# Patient Record
Sex: Male | Born: 1935 | Race: White | Hispanic: No | Marital: Married | Smoking: Former smoker
Health system: Southern US, Community
[De-identification: ages and names within clinical notes are randomized; demographics above are authoritative.]

## PROBLEM LIST (undated history)

## (undated) DIAGNOSIS — E7800 Pure hypercholesterolemia, unspecified: Secondary | ICD-10-CM

## (undated) DIAGNOSIS — Z923 Personal history of irradiation: Secondary | ICD-10-CM

## (undated) DIAGNOSIS — I10 Essential (primary) hypertension: Secondary | ICD-10-CM

## (undated) DIAGNOSIS — Z9221 Personal history of antineoplastic chemotherapy: Secondary | ICD-10-CM

## (undated) DIAGNOSIS — C3490 Malignant neoplasm of unspecified part of unspecified bronchus or lung: Secondary | ICD-10-CM

## (undated) HISTORY — DX: Pure hypercholesterolemia, unspecified: E78.00

## (undated) HISTORY — DX: Personal history of irradiation: Z92.3

## (undated) HISTORY — DX: Essential (primary) hypertension: I10

## (undated) HISTORY — DX: Personal history of antineoplastic chemotherapy: Z92.21

## (undated) HISTORY — PX: TONSILLECTOMY: SUR1361

## (undated) HISTORY — DX: Malignant neoplasm of unspecified part of unspecified bronchus or lung: C34.90

---

## 2009-03-14 HISTORY — PX: GASTROSTOMY TUBE PLACEMENT: SHX655

## 2010-03-14 DIAGNOSIS — C3490 Malignant neoplasm of unspecified part of unspecified bronchus or lung: Secondary | ICD-10-CM

## 2010-03-14 HISTORY — DX: Malignant neoplasm of unspecified part of unspecified bronchus or lung: C34.90

## 2010-07-28 ENCOUNTER — Other Ambulatory Visit: Payer: Self-pay | Admitting: Family Medicine

## 2010-07-28 DIAGNOSIS — R9389 Abnormal findings on diagnostic imaging of other specified body structures: Secondary | ICD-10-CM

## 2010-07-30 ENCOUNTER — Other Ambulatory Visit: Payer: Self-pay

## 2010-08-06 ENCOUNTER — Ambulatory Visit
Admission: RE | Admit: 2010-08-06 | Discharge: 2010-08-06 | Disposition: A | Discharge status: Home / Self Care | Payer: Medicare Other | Source: Ambulatory Visit | Attending: Family Medicine | Admitting: Family Medicine

## 2010-08-06 DIAGNOSIS — R9389 Abnormal findings on diagnostic imaging of other specified body structures: Secondary | ICD-10-CM

## 2010-08-06 MED ORDER — IOHEXOL 300 MG/ML  SOLN
75.0000 mL | Freq: Once | INTRAMUSCULAR | Status: AC | PRN
  Administered 2010-08-06: 75 mL via INTRAVENOUS

## 2010-08-13 HISTORY — PX: PORTACATH PLACEMENT: SHX2246

## 2010-08-18 ENCOUNTER — Encounter (INDEPENDENT_AMBULATORY_CARE_PROVIDER_SITE_OTHER): Payer: Medicare Other | Admitting: Thoracic Surgery

## 2010-08-18 ENCOUNTER — Other Ambulatory Visit: Payer: Self-pay | Admitting: Thoracic Surgery

## 2010-08-18 DIAGNOSIS — D491 Neoplasm of unspecified behavior of respiratory system: Secondary | ICD-10-CM

## 2010-08-18 DIAGNOSIS — R918 Other nonspecific abnormal finding of lung field: Secondary | ICD-10-CM

## 2010-08-19 NOTE — Letter (Signed)
August 18, 2010  Windle Guard, MD [address] [place], [place] [address]  Re:  [name], [name]              DOB:  [birth year]  Dear [name],  I saw the patient today.  This 75 year old patient quit smoking 20 years ago and below the angle reports a knot in the right neck which was a 3- cm lesion and the hard mass below the angle of the right mandible.  We got a CT scan of the chest which revealed that he had a left lower lobe mass with some hilar and subcarinal adenopathy.  He is referred here for evaluation.  He has had no hemoptysis, fever, chills, or excessive sputum.  PAST MEDICAL HISTORY:  PENICILLIN causes him to have a rash.  He takes glyburide, Actos for his diabetes, amlodipine for hypertension, lovastatin for dyslipidemia.  He also takes testosterone, Systane eyedrops, fish oil, and niacin.  As mentioned, he has had hypertension, hypercholesterolemia, diabetes mellitus type 2.  FAMILY HISTORY:  Noncontributory.  SOCIAL HISTORY:  Married, has two children.  Quit smoking in 1991.  He denies drinking alcohol on a regular basis.  REVIEW OF SYSTEMS:  He is 234 pounds, he is 5 feet 8 inches. GENERAL:  His weight has been stable. CARDIAC:  No angina or atrial fibrillation, but he gets shortness of breath with exertion. PULMONARY:  No asthma or wheezing. GI:  No nausea, vomiting, constipation, diarrhea. GU:  No kidney disease, dysuria, or frequent urination. VASCULAR:  No claudication, DVT, TIAs. NEUROLOGICAL:  No dizziness, headaches, blackouts, seizures. MUSCULOSKELETAL:  No arthritis. PSYCHIATRIC:  No depression or nervous. EYE AND ENT:  No changes in eyesight or hearing. HEMATOLOGICAL:  No problems with bleeding, clotting disorders, or anemia.  On physical exam, his blood pressure was 160/72, pulse 80, respirations 18, sats were 95%.  Head, eyes, ears, nose, and throat, were unremarkable.  Neck is supple without thyromegaly.  In the right neck and the  mandible of the right jaw, there is a 3-4 cm very hard mass that is not movable.  There is no scalene node adenopathy and no other supraclavicular adenopathy.  Chest is clear to auscultation and percussion.  Heart:  Regular sinus rhythm.  No murmur.  Abdomen:  Soft. There is no hepatosplenomegaly.  Extremities:  Pulses 2+.  There is no clubbing or edema.  Neurological:  He is oriented x3.  Sensory and motor intact.  Cranial nerves intact.  Unfortunately, the patient may either have stage IV lung cancer, although it is highly unusual for lung cancer to go to the right neck without multiple other areas of metastasis, it definitely by CT scan at least stage IIIA.  Thus, he may have a synchronous head and neck cancer, both of which would be smoking related.  Whatever the case is we need to proceed with workup which consisted of brain scan, CT scan of the neck, pulmonary function test, PET scan, and then I will be having a needle biopsy done of the right neck mass and I have scheduled him for bronchoscopy with endobronchial ultrasound and electromagnetic navigation to determine what type of lung cancer that he has.  We appreciate the opportunity of seeing the patient.  Sincerely,  Ines Bloomer, M.D. Electronically Signed  DPB/MEDQ  D:  08/18/2010  T:  08/19/2010  Job:  [id number]

## 2010-08-23 ENCOUNTER — Encounter (HOSPITAL_COMMUNITY): Payer: Self-pay

## 2010-08-23 ENCOUNTER — Ambulatory Visit (HOSPITAL_COMMUNITY)
Admission: RE | Admit: 2010-08-23 | Discharge: 2010-08-23 | Disposition: A | Discharge status: Home / Self Care | Payer: Medicare Other | Source: Ambulatory Visit | Attending: Thoracic Surgery | Admitting: Thoracic Surgery

## 2010-08-23 ENCOUNTER — Encounter (HOSPITAL_COMMUNITY)
Admission: RE | Admit: 2010-08-23 | Discharge: 2010-08-23 | Disposition: A | Discharge status: Home / Self Care | Payer: Medicare Other | Source: Ambulatory Visit | Attending: Thoracic Surgery | Admitting: Thoracic Surgery

## 2010-08-23 ENCOUNTER — Other Ambulatory Visit (HOSPITAL_COMMUNITY): Payer: Medicare Other

## 2010-08-23 ENCOUNTER — Other Ambulatory Visit: Payer: Self-pay | Admitting: Thoracic Surgery

## 2010-08-23 DIAGNOSIS — R059 Cough, unspecified: Secondary | ICD-10-CM | POA: Insufficient documentation

## 2010-08-23 DIAGNOSIS — J438 Other emphysema: Secondary | ICD-10-CM | POA: Insufficient documentation

## 2010-08-23 DIAGNOSIS — R918 Other nonspecific abnormal finding of lung field: Secondary | ICD-10-CM

## 2010-08-23 DIAGNOSIS — J988 Other specified respiratory disorders: Secondary | ICD-10-CM | POA: Insufficient documentation

## 2010-08-23 DIAGNOSIS — R599 Enlarged lymph nodes, unspecified: Secondary | ICD-10-CM | POA: Insufficient documentation

## 2010-08-23 DIAGNOSIS — R05 Cough: Secondary | ICD-10-CM | POA: Insufficient documentation

## 2010-08-23 DIAGNOSIS — R222 Localized swelling, mass and lump, trunk: Secondary | ICD-10-CM | POA: Insufficient documentation

## 2010-08-23 DIAGNOSIS — R0989 Other specified symptoms and signs involving the circulatory and respiratory systems: Secondary | ICD-10-CM | POA: Insufficient documentation

## 2010-08-23 DIAGNOSIS — J320 Chronic maxillary sinusitis: Secondary | ICD-10-CM | POA: Insufficient documentation

## 2010-08-23 DIAGNOSIS — M47812 Spondylosis without myelopathy or radiculopathy, cervical region: Secondary | ICD-10-CM | POA: Insufficient documentation

## 2010-08-23 DIAGNOSIS — R0609 Other forms of dyspnea: Secondary | ICD-10-CM | POA: Insufficient documentation

## 2010-08-23 LAB — GLUCOSE, CAPILLARY: Glucose-Capillary: 147 mg/dL — ABNORMAL HIGH (ref 70–99)

## 2010-08-23 MED ORDER — FLUDEOXYGLUCOSE F - 18 (FDG) INJECTION
17.8000 | Freq: Once | INTRAVENOUS | Status: AC | PRN
  Administered 2010-08-23: 17.8 via INTRAVENOUS

## 2010-08-23 MED ORDER — IOHEXOL 300 MG/ML  SOLN
100.0000 mL | Freq: Once | INTRAMUSCULAR | Status: AC | PRN
  Administered 2010-08-23: 100 mL via INTRAVENOUS

## 2010-08-26 ENCOUNTER — Other Ambulatory Visit: Payer: Self-pay | Admitting: Thoracic Surgery

## 2010-08-26 DIAGNOSIS — R221 Localized swelling, mass and lump, neck: Secondary | ICD-10-CM

## 2010-08-31 ENCOUNTER — Other Ambulatory Visit (HOSPITAL_COMMUNITY): Payer: Medicare Other

## 2010-08-31 ENCOUNTER — Ambulatory Visit (HOSPITAL_COMMUNITY)
Admission: RE | Admit: 2010-08-31 | Discharge: 2010-08-31 | Disposition: A | Discharge status: Home / Self Care | Payer: Medicare Other | Source: Ambulatory Visit | Attending: Thoracic Surgery | Admitting: Thoracic Surgery

## 2010-08-31 ENCOUNTER — Other Ambulatory Visit: Payer: Self-pay | Admitting: Interventional Radiology

## 2010-08-31 ENCOUNTER — Other Ambulatory Visit: Payer: Self-pay | Admitting: Thoracic Surgery

## 2010-08-31 ENCOUNTER — Encounter (HOSPITAL_COMMUNITY)
Admission: RE | Admit: 2010-08-31 | Discharge: 2010-08-31 | Disposition: A | Discharge status: Home / Self Care | Payer: Medicare Other | Source: Ambulatory Visit | Attending: Thoracic Surgery | Admitting: Thoracic Surgery

## 2010-08-31 DIAGNOSIS — R918 Other nonspecific abnormal finding of lung field: Secondary | ICD-10-CM

## 2010-08-31 DIAGNOSIS — R221 Localized swelling, mass and lump, neck: Secondary | ICD-10-CM

## 2010-08-31 DIAGNOSIS — Z01818 Encounter for other preprocedural examination: Secondary | ICD-10-CM | POA: Insufficient documentation

## 2010-08-31 DIAGNOSIS — Z01812 Encounter for preprocedural laboratory examination: Secondary | ICD-10-CM | POA: Insufficient documentation

## 2010-08-31 DIAGNOSIS — R599 Enlarged lymph nodes, unspecified: Secondary | ICD-10-CM | POA: Insufficient documentation

## 2010-08-31 DIAGNOSIS — Z79899 Other long term (current) drug therapy: Secondary | ICD-10-CM | POA: Insufficient documentation

## 2010-08-31 DIAGNOSIS — Z01810 Encounter for preprocedural cardiovascular examination: Secondary | ICD-10-CM | POA: Insufficient documentation

## 2010-08-31 DIAGNOSIS — R0602 Shortness of breath: Secondary | ICD-10-CM | POA: Insufficient documentation

## 2010-08-31 DIAGNOSIS — J984 Other disorders of lung: Secondary | ICD-10-CM | POA: Insufficient documentation

## 2010-08-31 LAB — GLUCOSE, CAPILLARY: Glucose-Capillary: 107 mg/dL — ABNORMAL HIGH (ref 70–99)

## 2010-08-31 LAB — COMPREHENSIVE METABOLIC PANEL
AST: 17 U/L (ref 0–37)
Albumin: 3.8 g/dL (ref 3.5–5.2)
BUN: 16 mg/dL (ref 6–23)
Calcium: 10.4 mg/dL (ref 8.4–10.5)
Creatinine, Ser: 1.27 mg/dL (ref 0.50–1.35)
Total Bilirubin: 0.5 mg/dL (ref 0.3–1.2)
Total Protein: 7.3 g/dL (ref 6.0–8.3)

## 2010-08-31 LAB — PROTIME-INR
INR: 0.98 (ref 0.00–1.49)
Prothrombin Time: 13.2 s (ref 11.6–15.2)

## 2010-08-31 LAB — CBC
HCT: 43.8 % (ref 39.0–52.0)
MCHC: 33.1 g/dL (ref 30.0–36.0)
MCV: 91.1 fL (ref 78.0–100.0)
RDW: 14.1 % (ref 11.5–15.5)

## 2010-08-31 LAB — SURGICAL PCR SCREEN
MRSA, PCR: NEGATIVE
Staphylococcus aureus: POSITIVE — AB

## 2010-09-02 ENCOUNTER — Other Ambulatory Visit: Payer: Self-pay | Admitting: Thoracic Surgery

## 2010-09-02 ENCOUNTER — Ambulatory Visit: Payer: Medicare Other | Admitting: Thoracic Surgery

## 2010-09-02 ENCOUNTER — Ambulatory Visit (HOSPITAL_COMMUNITY): Payer: Medicare Other

## 2010-09-02 ENCOUNTER — Ambulatory Visit (HOSPITAL_COMMUNITY)
Admission: RE | Admit: 2010-09-02 | Discharge: 2010-09-02 | Disposition: A | Discharge status: Home / Self Care | Payer: Medicare Other | Source: Ambulatory Visit | Attending: Thoracic Surgery | Admitting: Thoracic Surgery

## 2010-09-02 DIAGNOSIS — D487 Neoplasm of uncertain behavior of other specified sites: Secondary | ICD-10-CM

## 2010-09-02 DIAGNOSIS — E119 Type 2 diabetes mellitus without complications: Secondary | ICD-10-CM | POA: Insufficient documentation

## 2010-09-02 DIAGNOSIS — Z87891 Personal history of nicotine dependence: Secondary | ICD-10-CM | POA: Insufficient documentation

## 2010-09-02 DIAGNOSIS — C3430 Malignant neoplasm of lower lobe, unspecified bronchus or lung: Secondary | ICD-10-CM | POA: Insufficient documentation

## 2010-09-02 DIAGNOSIS — I10 Essential (primary) hypertension: Secondary | ICD-10-CM | POA: Insufficient documentation

## 2010-09-02 DIAGNOSIS — D381 Neoplasm of uncertain behavior of trachea, bronchus and lung: Secondary | ICD-10-CM

## 2010-09-02 DIAGNOSIS — Z79899 Other long term (current) drug therapy: Secondary | ICD-10-CM | POA: Insufficient documentation

## 2010-09-02 DIAGNOSIS — C771 Secondary and unspecified malignant neoplasm of intrathoracic lymph nodes: Secondary | ICD-10-CM | POA: Insufficient documentation

## 2010-09-02 LAB — GLUCOSE, CAPILLARY

## 2010-09-04 LAB — CULTURE, RESPIRATORY W GRAM STAIN

## 2010-09-05 LAB — CULTURE, BAL-QUANTITATIVE W GRAM STAIN: Colony Count: NO GROWTH

## 2010-09-06 NOTE — Op Note (Signed)
  NAME:  [name], SCHREUR NO.:  [id number]  MEDICAL RECORD NO.:  [id number]  LOCATION:  [facility]                         FACILITY:  [facility]  PHYSICIAN:  Ines Bloomer, M.D. DATE OF BIRTH:  06-12-1935  DATE OF PROCEDURE: DATE OF DISCHARGE:  09/02/2010                              OPERATIVE REPORT   PREOPERATIVE DIAGNOSIS:  Right neck mass, left lower lobe mass with mediastinal adenopathy.  POSTOPERATIVE DIAGNOSIS:  Squamous cell cancer, left lower lobe.  OPERATION PERFORMED:  Fiberoptic bronchoscopy with endobronchial ultrasound, electromagnetic navigation and biopsy of right neck mass.  SURGEON:  Ines Bloomer, MD  ANESTHESIA:  General anesthesia.  This patient had a previous biopsy of the right neck mass and the biopsy was nondiagnostic.  After he underwent general anesthesia, we prepped and draped the right neck mass and using an 11 blade to open the skin, we used a Tru-Cut needle with multiple passes to biopsy the right neck mass.  The patient had agreed to this and we found out about the procedure.  We then passed the video bronchoscope to the endotracheal tube.  The carina was in the midline.  The left mainstem, left upper lobe and left lower lobe orifices were normal.  The right mainstem, right upper lobe and right lower lobe orifices were normal.  We then pass the endobronchial ultrasound and identified a large 4L and a 7 node and we did multiple passes which included needle biopsies and brushes and needle aspirations and biopsies of this through the extended working channel.  After the electromagnetic navigation bronchoscope was complete and the EBUS was complete biopsying the 7 and 4 nodes, the video bronchoscope was removed and the patient was turned to the recovery room in stable condition.     Ines Bloomer, M.D.     DPB/MEDQ  D:  09/02/2010  T:  09/03/2010  Job:  [id number]  Electronically Signed by Jovita Gamma M.D. on 09/06/2010  04:28:16 PM

## 2010-09-08 ENCOUNTER — Ambulatory Visit (INDEPENDENT_AMBULATORY_CARE_PROVIDER_SITE_OTHER): Payer: Medicare Other | Admitting: Thoracic Surgery

## 2010-09-08 DIAGNOSIS — C3490 Malignant neoplasm of unspecified part of unspecified bronchus or lung: Secondary | ICD-10-CM

## 2010-09-09 NOTE — Letter (Signed)
September 08, 2010  Windle Guard, MD [address] [place], [place] [address]  Re:  [name], [name]                DOB:  November 17, 1935  Dear [name]:  I saw the patient back today.  His multiple biopsies of his neck mass revealed just inflammatory mass that I think is resolving.  We did not find any evidence any cancer there.  However, his left lower lobe lesion was positive for squamous cell cancer with metastasis to the subcarinal area, so he is a stage IIIA.  I will refer him for radiation and chemotherapy.  I have discussed this in detail with the patient and he agrees.  We will also put a Port-A-Cath in for his treatment.  I appreciate the opportunity of seeing the patient.  Ines Bloomer, M.D. Electronically Signed  DPB/MEDQ  D:  09/08/2010  T:  09/09/2010  Job:  [id number]  cc:   Lajuana Matte, M.D.

## 2010-09-10 NOTE — Assessment & Plan Note (Signed)
OFFICE VISIT  [name], [name] DOB:  06-Dec-1935                                        September 09, 2010 CHART #:  [id number]  We presented the patient to conference today and the consensus was he should be treated with radiation chemotherapy and he will be seeing Dr. Kathrynn Running and Dr. Shirline Frees.  However, there is still somewhat concerned that there may be an unknown primary with the right neck mass so I will get him appointment to see Dr. Jearld Fenton next week for evaluation.  I explained this in detail to the patient, and he and his wife agreed to this.  Nurse navigator will make the appointment for Dr. Arbutus Ped.  Ines Bloomer, M.D. Electronically Signed  DPB/MEDQ  D:  09/09/2010  T:  09/09/2010  Job:  [id number]  cc:   Dr. Marland Mcalpine, M.D. Suzanna Obey, M.D.

## 2010-09-13 ENCOUNTER — Ambulatory Visit
Admission: RE | Admit: 2010-09-13 | Discharge: 2010-09-13 | Disposition: A | Discharge status: Home / Self Care | Payer: Medicare Other | Source: Ambulatory Visit | Attending: Radiation Oncology | Admitting: Radiation Oncology

## 2010-09-13 DIAGNOSIS — Z510 Encounter for antineoplastic radiation therapy: Secondary | ICD-10-CM | POA: Insufficient documentation

## 2010-09-13 DIAGNOSIS — L589 Radiodermatitis, unspecified: Secondary | ICD-10-CM | POA: Insufficient documentation

## 2010-09-13 DIAGNOSIS — Y842 Radiological procedure and radiotherapy as the cause of abnormal reaction of the patient, or of later complication, without mention of misadventure at the time of the procedure: Secondary | ICD-10-CM | POA: Insufficient documentation

## 2010-09-13 DIAGNOSIS — K208 Other esophagitis without bleeding: Secondary | ICD-10-CM | POA: Insufficient documentation

## 2010-09-13 DIAGNOSIS — C3430 Malignant neoplasm of lower lobe, unspecified bronchus or lung: Secondary | ICD-10-CM | POA: Insufficient documentation

## 2010-09-16 ENCOUNTER — Other Ambulatory Visit: Payer: Self-pay | Admitting: Internal Medicine

## 2010-09-16 ENCOUNTER — Encounter (HOSPITAL_BASED_OUTPATIENT_CLINIC_OR_DEPARTMENT_OTHER): Payer: Medicare Other | Admitting: Internal Medicine

## 2010-09-16 DIAGNOSIS — C3430 Malignant neoplasm of lower lobe, unspecified bronchus or lung: Secondary | ICD-10-CM

## 2010-09-16 LAB — COMPREHENSIVE METABOLIC PANEL
ALT: 17 U/L (ref 0–53)
CO2: 24 mEq/L (ref 19–32)
Calcium: 9.4 mg/dL (ref 8.4–10.5)
Chloride: 103 mEq/L (ref 96–112)
Creatinine, Ser: 1.32 mg/dL (ref 0.50–1.35)
Glucose, Bld: 229 mg/dL — ABNORMAL HIGH (ref 70–99)
Sodium: 139 mEq/L (ref 135–145)
Total Bilirubin: 0.5 mg/dL (ref 0.3–1.2)
Total Protein: 7.1 g/dL (ref 6.0–8.3)

## 2010-09-16 LAB — CBC WITH DIFFERENTIAL/PLATELET
BASO%: 0.5 % (ref 0.0–2.0)
Eosinophils Absolute: 0.3 10*3/uL (ref 0.0–0.5)
HCT: 40.3 % (ref 38.4–49.9)
HGB: 13.6 g/dL (ref 13.0–17.1)
LYMPH%: 17.4 % (ref 14.0–49.0)
MCHC: 33.8 g/dL (ref 32.0–36.0)
MONO#: 0.3 10*3/uL (ref 0.1–0.9)
NEUT#: 5.1 10*3/uL (ref 1.5–6.5)
NEUT%: 72.9 % (ref 39.0–75.0)
Platelets: 318 10*3/uL (ref 140–400)
WBC: 7 10*3/uL (ref 4.0–10.3)
lymph#: 1.2 10*3/uL (ref 0.9–3.3)

## 2010-09-21 ENCOUNTER — Other Ambulatory Visit: Payer: Self-pay | Admitting: Internal Medicine

## 2010-09-21 ENCOUNTER — Encounter (HOSPITAL_BASED_OUTPATIENT_CLINIC_OR_DEPARTMENT_OTHER): Payer: Medicare Other | Admitting: Internal Medicine

## 2010-09-21 DIAGNOSIS — C3430 Malignant neoplasm of lower lobe, unspecified bronchus or lung: Secondary | ICD-10-CM

## 2010-09-21 DIAGNOSIS — Z5111 Encounter for antineoplastic chemotherapy: Secondary | ICD-10-CM

## 2010-09-21 LAB — COMPREHENSIVE METABOLIC PANEL
ALT: 19 U/L (ref 0–53)
AST: 19 U/L (ref 0–37)
Albumin: 4.3 g/dL (ref 3.5–5.2)
CO2: 23 mEq/L (ref 19–32)
Calcium: 10.1 mg/dL (ref 8.4–10.5)
Chloride: 101 mEq/L (ref 96–112)
Potassium: 4.3 mEq/L (ref 3.5–5.3)
Sodium: 135 mEq/L (ref 135–145)
Total Protein: 7.5 g/dL (ref 6.0–8.3)

## 2010-09-21 LAB — CBC WITH DIFFERENTIAL/PLATELET
BASO%: 1.1 % (ref 0.0–2.0)
Eosinophils Absolute: 0.4 10*3/uL (ref 0.0–0.5)
MCHC: 33.7 g/dL (ref 32.0–36.0)
MONO#: 0.5 10*3/uL (ref 0.1–0.9)
NEUT#: 5.1 10*3/uL (ref 1.5–6.5)
Platelets: 273 10*3/uL (ref 140–400)
RBC: 4.68 10*6/uL (ref 4.20–5.82)
WBC: 7.6 10*3/uL (ref 4.0–10.3)
lymph#: 1.6 10*3/uL (ref 0.9–3.3)
nRBC: 0 % (ref 0–0)

## 2010-09-27 ENCOUNTER — Other Ambulatory Visit: Payer: Self-pay | Admitting: Internal Medicine

## 2010-09-27 ENCOUNTER — Encounter (HOSPITAL_BASED_OUTPATIENT_CLINIC_OR_DEPARTMENT_OTHER): Payer: Medicare Other | Admitting: Internal Medicine

## 2010-09-27 DIAGNOSIS — C3430 Malignant neoplasm of lower lobe, unspecified bronchus or lung: Secondary | ICD-10-CM

## 2010-09-27 DIAGNOSIS — Z5111 Encounter for antineoplastic chemotherapy: Secondary | ICD-10-CM

## 2010-09-27 LAB — CBC WITH DIFFERENTIAL/PLATELET
BASO%: 0.4 % (ref 0.0–2.0)
Eosinophils Absolute: 0.3 10*3/uL (ref 0.0–0.5)
HCT: 39.9 % (ref 38.4–49.9)
LYMPH%: 13.8 % — ABNORMAL LOW (ref 14.0–49.0)
MCHC: 34.1 g/dL (ref 32.0–36.0)
MCV: 87.1 fL (ref 79.3–98.0)
MONO#: 0.5 10*3/uL (ref 0.1–0.9)
MONO%: 6.7 % (ref 0.0–14.0)
NEUT%: 74.9 % (ref 39.0–75.0)
Platelets: 222 10*3/uL (ref 140–400)
WBC: 7.8 10*3/uL (ref 4.0–10.3)

## 2010-09-27 LAB — COMPREHENSIVE METABOLIC PANEL
ALT: 20 U/L (ref 0–53)
AST: 23 U/L (ref 0–37)
Alkaline Phosphatase: 56 U/L (ref 39–117)
CO2: 26 mEq/L (ref 19–32)
Creatinine, Ser: 1.21 mg/dL (ref 0.50–1.35)
Sodium: 137 mEq/L (ref 135–145)
Total Bilirubin: 0.7 mg/dL (ref 0.3–1.2)

## 2010-09-30 ENCOUNTER — Ambulatory Visit (HOSPITAL_COMMUNITY)
Admission: RE | Admit: 2010-09-30 | Discharge: 2010-09-30 | Disposition: A | Discharge status: Home / Self Care | Payer: Medicare Other | Source: Ambulatory Visit | Attending: Thoracic Surgery | Admitting: Thoracic Surgery

## 2010-09-30 ENCOUNTER — Encounter (HOSPITAL_COMMUNITY)
Admission: RE | Admit: 2010-09-30 | Discharge: 2010-09-30 | Disposition: A | Discharge status: Home / Self Care | Payer: Medicare Other | Source: Ambulatory Visit | Attending: Thoracic Surgery | Admitting: Thoracic Surgery

## 2010-09-30 ENCOUNTER — Other Ambulatory Visit: Payer: Self-pay | Admitting: Thoracic Surgery

## 2010-09-30 DIAGNOSIS — C3490 Malignant neoplasm of unspecified part of unspecified bronchus or lung: Secondary | ICD-10-CM

## 2010-09-30 DIAGNOSIS — Z01812 Encounter for preprocedural laboratory examination: Secondary | ICD-10-CM | POA: Insufficient documentation

## 2010-09-30 DIAGNOSIS — Z01818 Encounter for other preprocedural examination: Secondary | ICD-10-CM | POA: Insufficient documentation

## 2010-09-30 DIAGNOSIS — M47814 Spondylosis without myelopathy or radiculopathy, thoracic region: Secondary | ICD-10-CM | POA: Insufficient documentation

## 2010-09-30 DIAGNOSIS — R0602 Shortness of breath: Secondary | ICD-10-CM | POA: Insufficient documentation

## 2010-09-30 LAB — SURGICAL PCR SCREEN: MRSA, PCR: NEGATIVE

## 2010-09-30 LAB — COMPREHENSIVE METABOLIC PANEL
Alkaline Phosphatase: 51 U/L (ref 39–117)
BUN: 25 mg/dL — ABNORMAL HIGH (ref 6–23)
Chloride: 100 mEq/L (ref 96–112)
GFR calc Af Amer: >60 mL/min (ref >60)
GFR calc non Af Amer: 59 mL/min — ABNORMAL LOW (ref >60)
Glucose, Bld: 188 mg/dL — ABNORMAL HIGH (ref 70–99)
Potassium: 4.9 mEq/L (ref 3.5–5.1)
Total Bilirubin: 0.8 mg/dL (ref 0.3–1.2)

## 2010-09-30 LAB — CBC
HCT: 38.1 % — ABNORMAL LOW (ref 39.0–52.0)
Hemoglobin: 12.7 g/dL — ABNORMAL LOW (ref 13.0–17.0)
MCH: 29.5 pg (ref 26.0–34.0)
MCHC: 33.3 g/dL (ref 30.0–36.0)

## 2010-10-01 ENCOUNTER — Ambulatory Visit (HOSPITAL_COMMUNITY): Payer: Medicare Other

## 2010-10-01 ENCOUNTER — Ambulatory Visit (HOSPITAL_COMMUNITY)
Admission: RE | Admit: 2010-10-01 | Discharge: 2010-10-01 | Disposition: A | Discharge status: Home / Self Care | Payer: Medicare Other | Source: Ambulatory Visit | Attending: Thoracic Surgery | Admitting: Thoracic Surgery

## 2010-10-01 DIAGNOSIS — Z01812 Encounter for preprocedural laboratory examination: Secondary | ICD-10-CM | POA: Insufficient documentation

## 2010-10-01 DIAGNOSIS — C3490 Malignant neoplasm of unspecified part of unspecified bronchus or lung: Secondary | ICD-10-CM | POA: Insufficient documentation

## 2010-10-01 DIAGNOSIS — Z01818 Encounter for other preprocedural examination: Secondary | ICD-10-CM | POA: Insufficient documentation

## 2010-10-01 DIAGNOSIS — Z79899 Other long term (current) drug therapy: Secondary | ICD-10-CM | POA: Insufficient documentation

## 2010-10-01 LAB — FUNGUS CULTURE W SMEAR: Fungal Smear: NONE SEEN

## 2010-10-04 ENCOUNTER — Other Ambulatory Visit: Payer: Self-pay | Admitting: Internal Medicine

## 2010-10-04 ENCOUNTER — Encounter (HOSPITAL_BASED_OUTPATIENT_CLINIC_OR_DEPARTMENT_OTHER): Payer: Medicare Other | Admitting: Internal Medicine

## 2010-10-04 DIAGNOSIS — C3430 Malignant neoplasm of lower lobe, unspecified bronchus or lung: Secondary | ICD-10-CM

## 2010-10-04 DIAGNOSIS — Z5111 Encounter for antineoplastic chemotherapy: Secondary | ICD-10-CM

## 2010-10-04 LAB — COMPREHENSIVE METABOLIC PANEL
AST: 13 U/L (ref 0–37)
BUN: 19 mg/dL (ref 6–23)
Calcium: 8.9 mg/dL (ref 8.4–10.5)
Chloride: 101 mEq/L (ref 96–112)
Creatinine, Ser: 1.18 mg/dL (ref 0.50–1.35)

## 2010-10-04 LAB — CBC WITH DIFFERENTIAL/PLATELET
Basophils Absolute: 0 10*3/uL (ref 0.0–0.1)
EOS%: 3.7 % (ref 0.0–7.0)
HCT: 36.1 % — ABNORMAL LOW (ref 38.4–49.9)
HGB: 12.2 g/dL — ABNORMAL LOW (ref 13.0–17.1)
MCH: 29.6 pg (ref 27.2–33.4)
MCV: 87.6 fL (ref 79.3–98.0)
MONO%: 9.9 % (ref 0.0–14.0)
NEUT%: 70.8 % (ref 39.0–75.0)
lymph#: 0.8 10*3/uL — ABNORMAL LOW (ref 0.9–3.3)

## 2010-10-04 NOTE — Op Note (Signed)
  NAMETAHA, DIMOND NO.:  [id number]  MEDICAL RECORD NO.:  [id number]  LOCATION:  [facility]                         FACILITY:  [facility]  PHYSICIAN:  Ines Bloomer, M.D. DATE OF BIRTH:  09-27-1935  DATE OF PROCEDURE: DATE OF DISCHARGE:                              OPERATIVE REPORT   PREOPERATIVE DIAGNOSIS:  Stage III A non-small cell lung cancer.  POSTOPERATIVE DIAGNOSIS:  Stage III A non-small cell lung cancer.  OPERATION PERFORMED:  Insertion of right IJ Port-A-Cath.  SURGEON:  Ines Bloomer, MD  General IV sedation and local anesthesia.  After prepping and draping the right chest, the right IJ puncture was performed after infiltrating the area with 1% Xylocaine and a guidewire was threaded under fluoro to the right atrium.  Stab wound was made around the guidewire removing the needle.  Another area was infiltrated with 1% Xylocaine inferior to this and transverse incision was made and a pocket was dissected out to the pectoral fascia.  A 9.6 preattached Bard Port-A-Cath was inserted and sutured in place with 2-0 silk and then the tubing run from the Port-A-Cath up to the right IJ and over the right IJ was passed a dilator with the peel-away sheath.  The tubing was made to the proximal right atrium at the SVC junction.  Over the dilator was passed a peel-away sheath.  The dilator and the guidewire were removed and through the peel-away sheath was passed the tubing and the tubing was removed.  It was confirmed to be right at the SVC/RA junction slightly in the right atrium.  It flushed easily and withdrew easily. Wound was closed with 3-0 Vicryl and Dermabond for the skin.  The patient returned to the recovery room in stable condition.     Ines Bloomer, M.D.     DPB/MEDQ  D:  10/01/2010  T:  10/01/2010  Job:  [id number]  cc:   Lajuana Matte, M.D.  Electronically Signed by Jovita Gamma M.D. on 10/04/2010 02:42:03 PM

## 2010-10-06 ENCOUNTER — Ambulatory Visit (INDEPENDENT_AMBULATORY_CARE_PROVIDER_SITE_OTHER): Payer: Medicare Other | Admitting: Thoracic Surgery

## 2010-10-06 DIAGNOSIS — C3490 Malignant neoplasm of unspecified part of unspecified bronchus or lung: Secondary | ICD-10-CM

## 2010-10-06 NOTE — Assessment & Plan Note (Signed)
OFFICE VISIT  Richart, HILLIARD BORGES DOB:  November 15, 1935                                        October 06, 2010 CHART #:  [id number]  The patient came in today.  His Port-A-Cath site is healing well.  His blood pressure is 120/60, pulse 95, respiratory rate 18, and sats were 95%.  He has had one treatment with chemotherapy.  Surprisingly, his Port-A-Cath and his right neck mass that decreased markedly and is continued to decrease since we saw him a week ago.  I have discussed this with Dr. Jearld Fenton and he wants to follow him up in the office to see if any further biopsy either particularly an open biopsy as needed.  Ines Bloomer, M.D. Electronically Signed  DPB/MEDQ  D:  10/06/2010  T:  10/06/2010  Job:  [id number]  cc:   Lajuana Matte, M.D. Windle Guard, M.D. Suzanna Obey, M.D.

## 2010-10-11 ENCOUNTER — Encounter (HOSPITAL_BASED_OUTPATIENT_CLINIC_OR_DEPARTMENT_OTHER): Payer: Medicare Other | Admitting: Internal Medicine

## 2010-10-11 ENCOUNTER — Other Ambulatory Visit: Payer: Self-pay | Admitting: Internal Medicine

## 2010-10-11 DIAGNOSIS — C3430 Malignant neoplasm of lower lobe, unspecified bronchus or lung: Secondary | ICD-10-CM

## 2010-10-11 DIAGNOSIS — Z5111 Encounter for antineoplastic chemotherapy: Secondary | ICD-10-CM

## 2010-10-11 LAB — CBC WITH DIFFERENTIAL/PLATELET
BASO%: 0.6 % (ref 0.0–2.0)
EOS%: 2 % (ref 0.0–7.0)
HCT: 38.2 % — ABNORMAL LOW (ref 38.4–49.9)
HGB: 13 g/dL (ref 13.0–17.1)
LYMPH%: 10.5 % — ABNORMAL LOW (ref 14.0–49.0)
MCH: 29.7 pg (ref 27.2–33.4)
MONO%: 11.3 % (ref 0.0–14.0)
NEUT#: 3.8 10*3/uL (ref 1.5–6.5)
RDW: 14.7 % — ABNORMAL HIGH (ref 11.0–14.6)
nRBC: 0 % (ref 0–0)

## 2010-10-11 LAB — COMPREHENSIVE METABOLIC PANEL
ALT: 13 U/L (ref 0–53)
AST: 12 U/L (ref 0–37)
Alkaline Phosphatase: 46 U/L (ref 39–117)
Creatinine, Ser: 1.18 mg/dL (ref 0.50–1.35)
Sodium: 139 mEq/L (ref 135–145)
Total Bilirubin: 0.8 mg/dL (ref 0.3–1.2)

## 2010-10-15 LAB — AFB CULTURE WITH SMEAR (NOT AT ARMC): Acid Fast Smear: NONE SEEN

## 2010-10-18 ENCOUNTER — Encounter (HOSPITAL_BASED_OUTPATIENT_CLINIC_OR_DEPARTMENT_OTHER): Payer: Medicare Other | Admitting: Internal Medicine

## 2010-10-18 ENCOUNTER — Other Ambulatory Visit: Payer: Self-pay | Admitting: Internal Medicine

## 2010-10-18 DIAGNOSIS — C3430 Malignant neoplasm of lower lobe, unspecified bronchus or lung: Secondary | ICD-10-CM

## 2010-10-18 DIAGNOSIS — Z5111 Encounter for antineoplastic chemotherapy: Secondary | ICD-10-CM

## 2010-10-18 DIAGNOSIS — C3490 Malignant neoplasm of unspecified part of unspecified bronchus or lung: Secondary | ICD-10-CM

## 2010-10-18 LAB — COMPREHENSIVE METABOLIC PANEL
Alkaline Phosphatase: 46 U/L (ref 39–117)
BUN: 16 mg/dL (ref 6–23)
CO2: 26 mEq/L (ref 19–32)
Creatinine, Ser: 1.28 mg/dL (ref 0.50–1.35)
Glucose, Bld: 260 mg/dL — ABNORMAL HIGH (ref 70–99)
Total Bilirubin: 0.5 mg/dL (ref 0.3–1.2)
Total Protein: 6.2 g/dL (ref 6.0–8.3)

## 2010-10-18 LAB — CBC WITH DIFFERENTIAL/PLATELET
Basophils Absolute: 0 10*3/uL (ref 0.0–0.1)
EOS%: 2.3 % (ref 0.0–7.0)
Eosinophils Absolute: 0.1 10*3/uL (ref 0.0–0.5)
HCT: 36.4 % — ABNORMAL LOW (ref 38.4–49.9)
HGB: 11.9 g/dL — ABNORMAL LOW (ref 13.0–17.1)
LYMPH%: 11 % — ABNORMAL LOW (ref 14.0–49.0)
MCH: 29.5 pg (ref 27.2–33.4)
MCV: 90.3 fL (ref 79.3–98.0)
MONO%: 11.2 % (ref 0.0–14.0)
NEUT#: 2.9 10*3/uL (ref 1.5–6.5)
NEUT%: 74.5 % (ref 39.0–75.0)
Platelets: 183 10*3/uL (ref 140–400)
RDW: 15 % — ABNORMAL HIGH (ref 11.0–14.6)

## 2010-10-22 ENCOUNTER — Other Ambulatory Visit: Payer: Self-pay | Admitting: Radiation Oncology

## 2010-10-22 DIAGNOSIS — C3490 Malignant neoplasm of unspecified part of unspecified bronchus or lung: Secondary | ICD-10-CM

## 2010-10-25 ENCOUNTER — Institutional Professional Consult (permissible substitution): Payer: Medicare Other | Admitting: Emergency Medicine

## 2010-10-25 ENCOUNTER — Other Ambulatory Visit: Payer: Self-pay | Admitting: Internal Medicine

## 2010-10-25 ENCOUNTER — Encounter (HOSPITAL_BASED_OUTPATIENT_CLINIC_OR_DEPARTMENT_OTHER): Payer: Medicare Other | Admitting: Internal Medicine

## 2010-10-25 DIAGNOSIS — C3430 Malignant neoplasm of lower lobe, unspecified bronchus or lung: Secondary | ICD-10-CM

## 2010-10-25 DIAGNOSIS — Z5111 Encounter for antineoplastic chemotherapy: Secondary | ICD-10-CM

## 2010-10-25 LAB — COMPREHENSIVE METABOLIC PANEL
Albumin: 3.9 g/dL (ref 3.5–5.2)
BUN: 26 mg/dL — ABNORMAL HIGH (ref 6–23)
CO2: 28 mEq/L (ref 19–32)
Glucose, Bld: 94 mg/dL (ref 70–99)
Potassium: 3.6 mEq/L (ref 3.5–5.3)
Sodium: 138 mEq/L (ref 135–145)
Total Protein: 6.5 g/dL (ref 6.0–8.3)

## 2010-10-25 LAB — CBC WITH DIFFERENTIAL/PLATELET
Basophils Absolute: 0 10*3/uL (ref 0.0–0.1)
Eosinophils Absolute: 0 10*3/uL (ref 0.0–0.5)
HGB: 12.1 g/dL — ABNORMAL LOW (ref 13.0–17.1)
LYMPH%: 9 % — ABNORMAL LOW (ref 14.0–49.0)
MCV: 88.9 fL (ref 79.3–98.0)
MONO#: 0.3 10*3/uL (ref 0.1–0.9)
MONO%: 5.5 % (ref 0.0–14.0)
NEUT#: 4.1 10*3/uL (ref 1.5–6.5)
Platelets: 165 10*3/uL (ref 140–400)
RBC: 4.06 10*6/uL — ABNORMAL LOW (ref 4.20–5.82)
RDW: 15.6 % — ABNORMAL HIGH (ref 11.0–14.6)
WBC: 4.9 10*3/uL (ref 4.0–10.3)

## 2010-11-01 ENCOUNTER — Other Ambulatory Visit: Payer: Self-pay | Admitting: Internal Medicine

## 2010-11-01 ENCOUNTER — Encounter (HOSPITAL_BASED_OUTPATIENT_CLINIC_OR_DEPARTMENT_OTHER): Payer: Medicare Other | Admitting: Internal Medicine

## 2010-11-01 DIAGNOSIS — Z5111 Encounter for antineoplastic chemotherapy: Secondary | ICD-10-CM

## 2010-11-01 DIAGNOSIS — C3430 Malignant neoplasm of lower lobe, unspecified bronchus or lung: Secondary | ICD-10-CM

## 2010-11-01 LAB — CBC WITH DIFFERENTIAL/PLATELET
BASO%: 0.7 % (ref 0.0–2.0)
EOS%: 0.7 % (ref 0.0–7.0)
Eosinophils Absolute: 0 10*3/uL (ref 0.0–0.5)
MCH: 29.5 pg (ref 27.2–33.4)
MCHC: 34.4 g/dL (ref 32.0–36.0)
MCV: 85.8 fL (ref 79.3–98.0)
MONO%: 9.9 % (ref 0.0–14.0)
NEUT#: 2.3 10*3/uL (ref 1.5–6.5)
RBC: 3.39 10*6/uL — ABNORMAL LOW (ref 4.20–5.82)
RDW: 15.7 % — ABNORMAL HIGH (ref 11.0–14.6)
nRBC: 0 % (ref 0–0)

## 2010-11-01 LAB — COMPREHENSIVE METABOLIC PANEL
ALT: 18 U/L (ref 0–53)
AST: 15 U/L (ref 0–37)
Alkaline Phosphatase: 49 U/L (ref 39–117)
Sodium: 138 mEq/L (ref 135–145)
Total Bilirubin: 1.1 mg/dL (ref 0.3–1.2)
Total Protein: 6 g/dL (ref 6.0–8.3)

## 2010-11-06 ENCOUNTER — Emergency Department (HOSPITAL_COMMUNITY): Payer: Medicare Other

## 2010-11-06 ENCOUNTER — Emergency Department (HOSPITAL_COMMUNITY)
Admission: EM | Admit: 2010-11-06 | Discharge: 2010-11-06 | Disposition: A | Discharge status: Home / Self Care | Payer: Medicare Other | Attending: Emergency Medicine | Admitting: Emergency Medicine

## 2010-11-06 DIAGNOSIS — K209 Esophagitis, unspecified without bleeding: Secondary | ICD-10-CM | POA: Insufficient documentation

## 2010-11-06 DIAGNOSIS — C3490 Malignant neoplasm of unspecified part of unspecified bronchus or lung: Secondary | ICD-10-CM | POA: Insufficient documentation

## 2010-11-06 DIAGNOSIS — Z79899 Other long term (current) drug therapy: Secondary | ICD-10-CM | POA: Insufficient documentation

## 2010-11-06 DIAGNOSIS — Z923 Personal history of irradiation: Secondary | ICD-10-CM | POA: Insufficient documentation

## 2010-11-06 DIAGNOSIS — R070 Pain in throat: Secondary | ICD-10-CM | POA: Insufficient documentation

## 2010-11-06 DIAGNOSIS — E119 Type 2 diabetes mellitus without complications: Secondary | ICD-10-CM | POA: Insufficient documentation

## 2010-11-06 DIAGNOSIS — I10 Essential (primary) hypertension: Secondary | ICD-10-CM | POA: Insufficient documentation

## 2010-11-06 DIAGNOSIS — D709 Neutropenia, unspecified: Secondary | ICD-10-CM | POA: Insufficient documentation

## 2010-11-06 LAB — CBC
HCT: 37.8 % — ABNORMAL LOW (ref 39.0–52.0)
Hemoglobin: 13.2 g/dL (ref 13.0–17.0)
MCHC: 34.9 g/dL (ref 30.0–36.0)
RBC: 4.45 MIL/uL (ref 4.22–5.81)
WBC: 0.9 10*3/uL — CL (ref 4.0–10.5)

## 2010-11-06 LAB — DIFFERENTIAL
Basophils Absolute: 0 10*3/uL (ref 0.0–0.1)
Basophils Relative: 0 % (ref 0–1)
Eosinophils Relative: 0 % (ref 0–5)
Lymphocytes Relative: 0 % — ABNORMAL LOW (ref 12–46)
Lymphs Abs: 0 10*3/uL — ABNORMAL LOW (ref 0.7–4.0)
Monocytes Relative: 0 % — ABNORMAL LOW (ref 3–12)
Neutrophils Relative %: 0 % — ABNORMAL LOW (ref 43–77)

## 2010-11-06 LAB — POCT I-STAT, CHEM 8
BUN: 19 mg/dL (ref 6–23)
Calcium, Ion: 1.25 mmol/L (ref 1.12–1.32)
Chloride: 103 mEq/L (ref 96–112)
Creatinine, Ser: 1.3 mg/dL (ref 0.50–1.35)
TCO2: 25 mmol/L (ref 0–100)

## 2010-11-06 LAB — BASIC METABOLIC PANEL
CO2: 26 mEq/L (ref 19–32)
Chloride: 100 mEq/L (ref 96–112)
GFR calc Af Amer: >60 mL/min (ref >60)
Potassium: 3.4 mEq/L — ABNORMAL LOW (ref 3.5–5.1)
Sodium: 136 mEq/L (ref 135–145)

## 2010-11-13 ENCOUNTER — Inpatient Hospital Stay (HOSPITAL_COMMUNITY)
Admission: EM | Admit: 2010-11-13 | Discharge: 2010-11-18 | DRG: 391 | Disposition: A | Discharge status: Home / Self Care | Payer: Medicare Other | Attending: Internal Medicine | Admitting: Internal Medicine

## 2010-11-13 DIAGNOSIS — Z880 Allergy status to penicillin: Secondary | ICD-10-CM

## 2010-11-13 DIAGNOSIS — R1310 Dysphagia, unspecified: Principal | ICD-10-CM | POA: Diagnosis present

## 2010-11-13 DIAGNOSIS — D6481 Anemia due to antineoplastic chemotherapy: Secondary | ICD-10-CM | POA: Diagnosis present

## 2010-11-13 DIAGNOSIS — I10 Essential (primary) hypertension: Secondary | ICD-10-CM | POA: Diagnosis present

## 2010-11-13 DIAGNOSIS — N179 Acute kidney failure, unspecified: Secondary | ICD-10-CM | POA: Diagnosis present

## 2010-11-13 DIAGNOSIS — Z79899 Other long term (current) drug therapy: Secondary | ICD-10-CM

## 2010-11-13 DIAGNOSIS — I951 Orthostatic hypotension: Secondary | ICD-10-CM | POA: Diagnosis present

## 2010-11-13 DIAGNOSIS — Z87891 Personal history of nicotine dependence: Secondary | ICD-10-CM

## 2010-11-13 DIAGNOSIS — E1149 Type 2 diabetes mellitus with other diabetic neurological complication: Secondary | ICD-10-CM | POA: Diagnosis present

## 2010-11-13 DIAGNOSIS — E876 Hypokalemia: Secondary | ICD-10-CM | POA: Diagnosis present

## 2010-11-13 DIAGNOSIS — E860 Dehydration: Secondary | ICD-10-CM | POA: Diagnosis present

## 2010-11-13 DIAGNOSIS — R197 Diarrhea, unspecified: Secondary | ICD-10-CM | POA: Diagnosis present

## 2010-11-13 DIAGNOSIS — B370 Candidal stomatitis: Secondary | ICD-10-CM | POA: Diagnosis present

## 2010-11-13 DIAGNOSIS — L98499 Non-pressure chronic ulcer of skin of other sites with unspecified severity: Secondary | ICD-10-CM | POA: Diagnosis present

## 2010-11-13 DIAGNOSIS — T451X5A Adverse effect of antineoplastic and immunosuppressive drugs, initial encounter: Secondary | ICD-10-CM | POA: Diagnosis present

## 2010-11-13 DIAGNOSIS — Y842 Radiological procedure and radiotherapy as the cause of abnormal reaction of the patient, or of later complication, without mention of misadventure at the time of the procedure: Secondary | ICD-10-CM | POA: Diagnosis present

## 2010-11-13 DIAGNOSIS — L589 Radiodermatitis, unspecified: Secondary | ICD-10-CM | POA: Diagnosis present

## 2010-11-13 DIAGNOSIS — C3430 Malignant neoplasm of lower lobe, unspecified bronchus or lung: Secondary | ICD-10-CM | POA: Diagnosis present

## 2010-11-13 DIAGNOSIS — E43 Unspecified severe protein-calorie malnutrition: Secondary | ICD-10-CM | POA: Diagnosis present

## 2010-11-13 DIAGNOSIS — Z923 Personal history of irradiation: Secondary | ICD-10-CM

## 2010-11-13 DIAGNOSIS — E1142 Type 2 diabetes mellitus with diabetic polyneuropathy: Secondary | ICD-10-CM | POA: Diagnosis present

## 2010-11-13 DIAGNOSIS — D72819 Decreased white blood cell count, unspecified: Secondary | ICD-10-CM | POA: Diagnosis present

## 2010-11-13 DIAGNOSIS — K208 Other esophagitis without bleeding: Secondary | ICD-10-CM | POA: Diagnosis present

## 2010-11-13 DIAGNOSIS — E785 Hyperlipidemia, unspecified: Secondary | ICD-10-CM | POA: Diagnosis present

## 2010-11-13 DIAGNOSIS — B3781 Candidal esophagitis: Secondary | ICD-10-CM | POA: Diagnosis present

## 2010-11-13 LAB — POCT I-STAT, CHEM 8
BUN: 13 mg/dL (ref 6–23)
Calcium, Ion: 1.11 mmol/L — ABNORMAL LOW (ref 1.12–1.32)
Chloride: 99 mEq/L (ref 96–112)
Creatinine, Ser: 1.6 mg/dL — ABNORMAL HIGH (ref 0.50–1.35)
Glucose, Bld: 111 mg/dL — ABNORMAL HIGH (ref 70–99)

## 2010-11-13 LAB — MAGNESIUM: Magnesium: 2.3 mg/dL (ref 1.5–2.5)

## 2010-11-13 LAB — DIFFERENTIAL
Eosinophils Relative: 0 % (ref 0–5)
Lymphocytes Relative: 9 % — ABNORMAL LOW (ref 12–46)
Lymphs Abs: 0.4 10*3/uL — ABNORMAL LOW (ref 0.7–4.0)
Monocytes Relative: 19 % — ABNORMAL HIGH (ref 3–12)
Neutro Abs: 2.8 10*3/uL (ref 1.7–7.7)

## 2010-11-13 LAB — GLUCOSE, CAPILLARY: Glucose-Capillary: 105 mg/dL — ABNORMAL HIGH (ref 70–99)

## 2010-11-13 LAB — CBC
HCT: 21.2 % — ABNORMAL LOW (ref 39.0–52.0)
Hemoglobin: 7.2 g/dL — ABNORMAL LOW (ref 13.0–17.0)
MCH: 29.6 pg (ref 26.0–34.0)
MCHC: 34 g/dL (ref 30.0–36.0)
RBC: 2.43 MIL/uL — ABNORMAL LOW (ref 4.22–5.81)

## 2010-11-13 NOTE — H&P (Signed)
NAME:  [name], [name] NO.:  [id number]  MEDICAL RECORD NO.:  [id number]  LOCATION:  [facility]                         FACILITY:  [facility]  PHYSICIAN:  Marinda Elk, M.D.DATE OF BIRTH:  08/01/1935  DATE OF ADMISSION:  11/13/2010 DATE OF DISCHARGE:                             HISTORY & PHYSICAL   PRIMARY CARE DOCTOR:  He does not remember.  ONCOLOGIST:  Lajuana Matte, M.D.  CHIEF COMPLAINT:  Generalized weakness with dizziness upon standing and pain with swallowing.  HISTORY:  This is a 75 year old man with past medical history of lung cell cancer treated by Dr. Arbutus Ped, last chemotherapy was on November 01, 2010, and last radiation therapy was on November 05, 2010, came to the ED last weekend complaining of the same pain of dysphagia.  He was given a GI cocktail and it got better, so he was discharged home.  Today, he returns with ongoing diarrhea for the last 4 days.  Also, minimal fluid intake and no solid intake secondary to his dysphagia with foods.  He also relates that he has been weak and dizzy upon standing every time he tries to get up, so he lays in bed most of the day.  He relates no fever, chills, cough, or shortness of breath.  His dysphagia is to solids more than to liquids and has been progressively getting worse to the point where he is only drinking liquids when he could tolerate them. He relates that he has had no fever, but he continues to have diarrhea. They have subsided this morning where he only had 2 this morning, but he had been having them for the past 3 days.  ALLERGIES:  PENICILLIN, he gets hives.  Very sensitive to HYDROCODONE.  MEDICATIONS: 1. He is on latanoprost 0.005% solution in each eye at bedtime. 2. Amlodipine 1 tablet at bedtime. 3. Actos 30 mg daily. 4. Glimepiride 4 mg 2 tablets with breakfast. 5. Lovastatin 2 tablets 20 mg daily. 6. Niacin 500 mg with breakfast. 7. Multivitamin 1 tab daily. 8. Fish oil 1200 mg  daily. 9. Systane drops in each eye.  PAST MEDICAL HISTORY: 1. Hypertension. 2. Hyperlipidemia. 3. Diabetes mellitus, unknown hemoglobin A1c. 4. Small cell lung cancer, unknown stage.  SOCIAL HISTORY:  He denies alcohol, tobacco, or drugs.  He lives with the family in [place].  FAMILY HISTORY:  His mother and father died at the age of 78s with heart failure.  REVIEW OF SYSTEMS:  10-point review of systems done, pertinent positives per HPI.  PHYSICAL EXAMINATION:  VITAL SIGNS: Temperature 97, pulse of 85, blood pressure 92/60, after a liter of fluid it was 115/54.  He was satting 100% on room air, breathing 20 times per minute. GENERAL: He is awake, alert, and oriented x3, in discomfort secondary to the dysphagia. HEENT:  Dry mucous membranes.  I think I see some white plaques at the right side on his tonsils, but atraumatic, normocephalic.  Anicteric. NECK;  No JVD.  No bruits.  No thyromegaly. CARDIOVASCULAR:  He has a regular rate and rhythm with positive S1 and S2.  No murmurs, rubs, or gallops. LUNGS: Good air movement, clear to auscultation. ABDOMEN: Positive  bowel sounds, nontender, nondistended, soft. EXTREMITIES: Positive pulses.  No clubbing, cyanosis, or edema. SKIN:  He has no rashes or ulceration except on his back, on his right scapula which he has some denuded skin and burst vesicles which he relates is secondary to his radiation therapy. NEURO EXAM:  Nonfocal.  LABORATORIES ON ADMISSION:  Shows sodium 137, potassium 3.0, chloride 99, glucose of 111, BUN of 13, creatinine of 1.6.  His hemoglobin was 8, after a liter of fluid it went down to 7.2.  His white count of 3.9, MCV of 87, platelet count of 187, ANC of 2.8.  No imaging at this time and no EKG.  ASSESSMENT AND PLAN: 1. Dysphagia in a patient with radiation therapy and chemotherapy.     Concern for esophagitis.  I will get a barium esophagus to rule out     cancer invasion, also rings and webs, but  this is most likely     esophageal candidiasis as he appears to have some plaque on the     right side on his tonsils.  We will start him on Diflucan and if no     improvement, we will have to call GI for further endoscopy.  I have     discussed with the family.  They have agreed to this.  There would     like to minimize procedures as possible.  So, we will treat with     Diflucan and continue to use GI cocktail as this relieves his pain     and see how he improves tomorrow. 2. Orthostatic hypotension by history.  He was dizzy when I sat him up     in bed.  So, I did not check his orthostatics.  We will give him IV     fluids gently due to his creatinine that is 1.6.  We do not have a     previous creatinine to know which is his baseline.  We will check a     FENa.  We will recheck a BMET in the morning.  Also, check     orthostatics in the morning to see if they have resolved. 3. Acute anemia.  His hemoglobin dropped from 8.8 to 7.2 here in the     ED with just a liter of fluid.  We will type and cross and     transfuse him.  We will get an anemia panel.  My guess is that this     drop in hemoglobin is probably secondary to chemotherapy.  We will     check a CBC in the morning. 4. Diarrhea.  The patient is on chemotherapy.  For C diff, we will     check a C diff and stool C diff PCR in stool. 5. Leukopenia probably secondary to chemotherapy.  We will continue to     monitor. 6. Acute kidney injury unknown baseline.  My guess is it is probably a     little bit lower.  He has not been drinking     or eating well and continues to have diarrhea.  So, we will give     him IV fluids and he will be admitted in the morning. 7. Ulcer on his right scalpel, that is probably secondary to radiation     therapy.  We will get a wound care consult.     Marinda Elk, M.D.     AF/MEDQ  D:  11/13/2010  T:  11/13/2010  Job:  [id number]  cc:   Lajuana Matte, M.D.  Electronically Signed  by Marinda Elk M.D. on 11/13/2010 07:28:07 PM

## 2010-11-14 ENCOUNTER — Inpatient Hospital Stay (HOSPITAL_COMMUNITY): Payer: Medicare Other

## 2010-11-14 DIAGNOSIS — K208 Other esophagitis: Secondary | ICD-10-CM

## 2010-11-14 DIAGNOSIS — C3490 Malignant neoplasm of unspecified part of unspecified bronchus or lung: Secondary | ICD-10-CM

## 2010-11-14 DIAGNOSIS — D509 Iron deficiency anemia, unspecified: Secondary | ICD-10-CM

## 2010-11-14 LAB — CBC
Hemoglobin: 7 g/dL — ABNORMAL LOW (ref 13.0–17.0)
MCH: 29.7 pg (ref 26.0–34.0)
Platelets: 159 10*3/uL (ref 150–400)
RBC: 2.36 MIL/uL — ABNORMAL LOW (ref 4.22–5.81)
WBC: 4 10*3/uL (ref 4.0–10.5)

## 2010-11-14 LAB — VITAMIN B12: Vitamin B-12: 435 pg/mL (ref 211–911)

## 2010-11-14 LAB — URINALYSIS, MICROSCOPIC ONLY
Leukocytes, UA: NEGATIVE
Protein, ur: NEGATIVE mg/dL
Specific Gravity, Urine: 1.018 (ref 1.005–1.030)
Urobilinogen, UA: 1 mg/dL (ref 0.0–1.0)

## 2010-11-14 LAB — COMPREHENSIVE METABOLIC PANEL
ALT: 9 U/L (ref 0–53)
AST: 11 U/L (ref 0–37)
Alkaline Phosphatase: 60 U/L (ref 39–117)
CO2: 27 mEq/L (ref 19–32)
Calcium: 7.9 mg/dL — ABNORMAL LOW (ref 8.4–10.5)
GFR calc non Af Amer: 52 mL/min — ABNORMAL LOW (ref >60)
Glucose, Bld: 111 mg/dL — ABNORMAL HIGH (ref 70–99)
Potassium: 3.1 mEq/L — ABNORMAL LOW (ref 3.5–5.1)
Sodium: 137 mEq/L (ref 135–145)

## 2010-11-14 LAB — DIFFERENTIAL
Basophils Absolute: 0 10*3/uL (ref 0.0–0.1)
Eosinophils Relative: 1 % (ref 0–5)
Lymphocytes Relative: 7 % — ABNORMAL LOW (ref 12–46)
Lymphs Abs: 0.3 10*3/uL — ABNORMAL LOW (ref 0.7–4.0)
Monocytes Relative: 15 % — ABNORMAL HIGH (ref 3–12)
Neutrophils Relative %: 76 % (ref 43–77)
WBC Morphology: INCREASED

## 2010-11-14 LAB — IRON AND TIBC
Iron: 28 ug/dL — ABNORMAL LOW (ref 42–135)
TIBC: 215 ug/dL (ref 215–435)

## 2010-11-14 LAB — GLUCOSE, CAPILLARY: Glucose-Capillary: 133 mg/dL — ABNORMAL HIGH (ref 70–99)

## 2010-11-14 LAB — PREPARE RBC (CROSSMATCH)

## 2010-11-14 LAB — ABO/RH: ABO/RH(D): A POS

## 2010-11-15 LAB — BASIC METABOLIC PANEL
BUN: 10 mg/dL (ref 6–23)
Calcium: 7.5 mg/dL — ABNORMAL LOW (ref 8.4–10.5)
Creatinine, Ser: 1.13 mg/dL (ref 0.50–1.35)
GFR calc non Af Amer: >60 mL/min (ref >60)
Glucose, Bld: 102 mg/dL — ABNORMAL HIGH (ref 70–99)

## 2010-11-15 LAB — CBC
HCT: 23 % — ABNORMAL LOW (ref 39.0–52.0)
Hemoglobin: 7.7 g/dL — ABNORMAL LOW (ref 13.0–17.0)
MCV: 89.1 fL (ref 78.0–100.0)
Platelets: 161 10*3/uL (ref 150–400)
RBC: 2.58 MIL/uL — ABNORMAL LOW (ref 4.22–5.81)
WBC: 4.2 10*3/uL (ref 4.0–10.5)

## 2010-11-15 LAB — PREPARE RBC (CROSSMATCH)

## 2010-11-16 DIAGNOSIS — C3490 Malignant neoplasm of unspecified part of unspecified bronchus or lung: Secondary | ICD-10-CM

## 2010-11-16 LAB — TYPE AND SCREEN
ABO/RH(D): A POS
Antibody Screen: NEGATIVE
Unit division: 0
Unit division: 0

## 2010-11-16 LAB — BASIC METABOLIC PANEL
BUN: 8 mg/dL (ref 6–23)
GFR calc non Af Amer: >60 mL/min (ref >60)
Glucose, Bld: 108 mg/dL — ABNORMAL HIGH (ref 70–99)
Potassium: 3.8 mEq/L (ref 3.5–5.1)

## 2010-11-16 LAB — GLUCOSE, CAPILLARY: Glucose-Capillary: 141 mg/dL — ABNORMAL HIGH (ref 70–99)

## 2010-11-16 LAB — DIFFERENTIAL
Eosinophils Relative: 1 % (ref 0–5)
Lymphocytes Relative: 9 % — ABNORMAL LOW (ref 12–46)
Lymphs Abs: 0.5 10*3/uL — ABNORMAL LOW (ref 0.7–4.0)
Monocytes Absolute: 0.7 10*3/uL (ref 0.1–1.0)

## 2010-11-16 LAB — CBC
HCT: 29.2 % — ABNORMAL LOW (ref 39.0–52.0)
MCHC: 33.9 g/dL (ref 30.0–36.0)
MCV: 87.7 fL (ref 78.0–100.0)
RDW: 17.9 % — ABNORMAL HIGH (ref 11.5–15.5)
WBC: 5.5 10*3/uL (ref 4.0–10.5)

## 2010-11-17 LAB — BASIC METABOLIC PANEL
CO2: 23 mEq/L (ref 19–32)
Calcium: 7.9 mg/dL — ABNORMAL LOW (ref 8.4–10.5)
GFR calc Af Amer: >60 mL/min (ref >60)
GFR calc non Af Amer: >60 mL/min (ref >60)
Sodium: 137 mEq/L (ref 135–145)

## 2010-11-17 LAB — GLUCOSE, CAPILLARY
Glucose-Capillary: 102 mg/dL — ABNORMAL HIGH (ref 70–99)
Glucose-Capillary: 95 mg/dL (ref 70–99)
Glucose-Capillary: 99 mg/dL (ref 70–99)

## 2010-11-17 LAB — CLOSTRIDIUM DIFFICILE BY PCR: Toxigenic C. Difficile by PCR: NEGATIVE

## 2010-11-17 LAB — CBC
HCT: 27.7 % — ABNORMAL LOW (ref 39.0–52.0)
Platelets: 187 10*3/uL (ref 150–400)
RDW: 18.1 % — ABNORMAL HIGH (ref 11.5–15.5)
WBC: 4.8 10*3/uL (ref 4.0–10.5)

## 2010-11-18 LAB — GLUCOSE, CAPILLARY: Glucose-Capillary: 114 mg/dL — ABNORMAL HIGH (ref 70–99)

## 2010-11-18 LAB — CBC
MCH: 29.1 pg (ref 26.0–34.0)
MCHC: 32.9 g/dL (ref 30.0–36.0)
MCV: 88.4 fL (ref 78.0–100.0)
Platelets: 209 10*3/uL (ref 150–400)
RDW: 18 % — ABNORMAL HIGH (ref 11.5–15.5)

## 2010-11-22 ENCOUNTER — Inpatient Hospital Stay (HOSPITAL_COMMUNITY)
Admission: EM | Admit: 2010-11-22 | Discharge: 2010-12-09 | DRG: 865 | Disposition: A | Discharge status: Home Health | Payer: Medicare Other | Attending: Internal Medicine | Admitting: Internal Medicine

## 2010-11-22 DIAGNOSIS — K208 Other esophagitis without bleeding: Secondary | ICD-10-CM | POA: Diagnosis present

## 2010-11-22 DIAGNOSIS — E876 Hypokalemia: Secondary | ICD-10-CM | POA: Diagnosis not present

## 2010-11-22 DIAGNOSIS — E119 Type 2 diabetes mellitus without complications: Secondary | ICD-10-CM | POA: Diagnosis present

## 2010-11-22 DIAGNOSIS — E860 Dehydration: Secondary | ICD-10-CM | POA: Diagnosis present

## 2010-11-22 DIAGNOSIS — R627 Adult failure to thrive: Secondary | ICD-10-CM | POA: Diagnosis present

## 2010-11-22 DIAGNOSIS — Z923 Personal history of irradiation: Secondary | ICD-10-CM

## 2010-11-22 DIAGNOSIS — Y842 Radiological procedure and radiotherapy as the cause of abnormal reaction of the patient, or of later complication, without mention of misadventure at the time of the procedure: Secondary | ICD-10-CM | POA: Diagnosis present

## 2010-11-22 DIAGNOSIS — Z880 Allergy status to penicillin: Secondary | ICD-10-CM

## 2010-11-22 DIAGNOSIS — N179 Acute kidney failure, unspecified: Secondary | ICD-10-CM | POA: Diagnosis present

## 2010-11-22 DIAGNOSIS — R1310 Dysphagia, unspecified: Secondary | ICD-10-CM | POA: Diagnosis present

## 2010-11-22 DIAGNOSIS — E43 Unspecified severe protein-calorie malnutrition: Secondary | ICD-10-CM | POA: Diagnosis present

## 2010-11-22 DIAGNOSIS — Z87891 Personal history of nicotine dependence: Secondary | ICD-10-CM

## 2010-11-22 DIAGNOSIS — E669 Obesity, unspecified: Secondary | ICD-10-CM | POA: Diagnosis present

## 2010-11-22 DIAGNOSIS — B0089 Other herpesviral infection: Principal | ICD-10-CM | POA: Diagnosis present

## 2010-11-22 DIAGNOSIS — I10 Essential (primary) hypertension: Secondary | ICD-10-CM | POA: Diagnosis present

## 2010-11-22 DIAGNOSIS — E785 Hyperlipidemia, unspecified: Secondary | ICD-10-CM | POA: Diagnosis present

## 2010-11-22 DIAGNOSIS — C3430 Malignant neoplasm of lower lobe, unspecified bronchus or lung: Secondary | ICD-10-CM | POA: Diagnosis present

## 2010-11-22 DIAGNOSIS — R000 Tachycardia, unspecified: Secondary | ICD-10-CM | POA: Diagnosis not present

## 2010-11-22 DIAGNOSIS — Z79899 Other long term (current) drug therapy: Secondary | ICD-10-CM

## 2010-11-22 LAB — CBC
HCT: 36 % — ABNORMAL LOW (ref 39.0–52.0)
Hemoglobin: 12 g/dL — ABNORMAL LOW (ref 13.0–17.0)
MCH: 29.6 pg (ref 26.0–34.0)
RBC: 4.06 MIL/uL — ABNORMAL LOW (ref 4.22–5.81)

## 2010-11-22 LAB — COMPREHENSIVE METABOLIC PANEL
ALT: 27 U/L (ref 0–53)
Alkaline Phosphatase: 80 U/L (ref 39–117)
BUN: 12 mg/dL (ref 6–23)
CO2: 23 mEq/L (ref 19–32)
GFR calc Af Amer: 55 mL/min — ABNORMAL LOW (ref >60)
GFR calc non Af Amer: 45 mL/min — ABNORMAL LOW (ref >60)
Glucose, Bld: 165 mg/dL — ABNORMAL HIGH (ref 70–99)
Potassium: 3.9 mEq/L (ref 3.5–5.1)
Sodium: 134 mEq/L — ABNORMAL LOW (ref 135–145)

## 2010-11-22 LAB — DIFFERENTIAL
Basophils Relative: 1 % (ref 0–1)
Lymphocytes Relative: 14 % (ref 12–46)
Monocytes Relative: 12 % (ref 3–12)
Neutro Abs: 4 10*3/uL (ref 1.7–7.7)
Neutrophils Relative %: 72 % (ref 43–77)

## 2010-11-22 LAB — URINALYSIS, ROUTINE W REFLEX MICROSCOPIC
Ketones, ur: NEGATIVE mg/dL
Nitrite: NEGATIVE
Protein, ur: NEGATIVE mg/dL
Urobilinogen, UA: 1 mg/dL (ref 0.0–1.0)

## 2010-11-23 LAB — GLUCOSE, CAPILLARY
Glucose-Capillary: 126 mg/dL — ABNORMAL HIGH (ref 70–99)
Glucose-Capillary: 133 mg/dL — ABNORMAL HIGH (ref 70–99)
Glucose-Capillary: 100 mg/dL — ABNORMAL HIGH (ref 70–99)

## 2010-11-23 LAB — CBC
HCT: 32.9 % — ABNORMAL LOW (ref 39.0–52.0)
MCHC: 33.1 g/dL (ref 30.0–36.0)
MCV: 89.2 fL (ref 78.0–100.0)
RDW: 18.2 % — ABNORMAL HIGH (ref 11.5–15.5)
WBC: 4.9 10*3/uL (ref 4.0–10.5)

## 2010-11-23 LAB — DIFFERENTIAL
Eosinophils Relative: 3 % (ref 0–5)
Lymphocytes Relative: 9 % — ABNORMAL LOW (ref 12–46)
Lymphs Abs: 0.5 10*3/uL — ABNORMAL LOW (ref 0.7–4.0)
Monocytes Absolute: 0.7 10*3/uL (ref 0.1–1.0)
Monocytes Relative: 14 % — ABNORMAL HIGH (ref 3–12)

## 2010-11-23 LAB — BASIC METABOLIC PANEL
BUN: 12 mg/dL (ref 6–23)
GFR calc non Af Amer: 57 mL/min — ABNORMAL LOW (ref >60)
Glucose, Bld: 125 mg/dL — ABNORMAL HIGH (ref 70–99)
Potassium: 3.4 mEq/L — ABNORMAL LOW (ref 3.5–5.1)

## 2010-11-24 ENCOUNTER — Other Ambulatory Visit: Payer: Self-pay | Admitting: Gastroenterology

## 2010-11-24 LAB — BASIC METABOLIC PANEL
GFR calc Af Amer: >60 mL/min (ref >60)
GFR calc non Af Amer: >60 mL/min (ref >60)
Glucose, Bld: 108 mg/dL — ABNORMAL HIGH (ref 70–99)
Potassium: 3.3 mEq/L — ABNORMAL LOW (ref 3.5–5.1)
Sodium: 138 mEq/L (ref 135–145)

## 2010-11-24 LAB — GLUCOSE, CAPILLARY
Glucose-Capillary: 102 mg/dL — ABNORMAL HIGH (ref 70–99)
Glucose-Capillary: 116 mg/dL — ABNORMAL HIGH (ref 70–99)
Glucose-Capillary: 120 mg/dL — ABNORMAL HIGH (ref 70–99)
Glucose-Capillary: 123 mg/dL — ABNORMAL HIGH (ref 70–99)
Glucose-Capillary: 132 mg/dL — ABNORMAL HIGH (ref 70–99)

## 2010-11-24 NOTE — Consult Note (Signed)
NAME:  [name], [name] NO.:  [id number]  MEDICAL RECORD NO.:  [id number]  LOCATION:  1506                         FACILITY:  [facility]  PHYSICIAN:  Reece Packer, M.D. 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  Office notes have been attached to the hospital information.  The patient was admitted on November 13, 2010, to the hospitalist service with weakness, difficulty swallowing, and pain with swallowing, and new diarrhea with dehydration.  Patient has remote history of smoking which was discontinued 23 years ago.  He was found to have squamous cell carcinoma of lung in June 2012, with a primary left lower lobe and also adenopathy subcarinal, left paratracheal, and right internal jugular.  He received radiation by Dr. Kathrynn Running which was conformal treatment from July 11 through November 05, 2010, this given with 7 cycles of low dose sensitizing weekly Taxol/carboplatin.  Last carboplatin and Taxol was given November 01, 2010.  He is scheduled back to Dr. Arbutus Ped on December 06, 2010. Patient also was seen in the emergency department on November 06, 2010, for weakness, and I believe was given IV fluids then.  He had not had any previous diarrhea and describes the stools as yellow.  He tolerated chemotherapy generally well, with no significant nausea and no worsening of preexisting diabetic peripheral neuropathy symptoms.  He has not seen any bleeding.  He has had progressive discomfort with swallowing for the last 1-2 weeks consistent with radiation esophagitis.  He has still been able to drink liquids and eat pudding and ice cream, and has not  tried any supplements.  Note, he is a diabetic.  No family members have had diarrhea.  The radiation and those chemotherapy drugs would not be expected to cause diarrhea.  Patient is feeling better today with IV fluids and having had 1 unit of packed red cells.  His dysphagia is still significant.  Diarrhea seems to be at least significantly improved.  REVIEW OF SYSTEMS:  No fever.  He has had some minimal cough when he tries to swallow which is nonproductive.  Denies shortness of breath. Now denies chest pain.  Baseline peripheral diabetic neuropathy symptoms unchanged.  No bleeding.  ALLERGIES:  Allergy to PENICILLIN.  PAST HISTORY: 1. Hypertension. 2. Diabetes. 3. Elevated cholesterol. 4. Tonsillectomy.  FAMILY HISTORY:  Parents died of cardiac disease in their 53s.  SOCIAL HISTORY:  He is married, lives with his wife, has 2 daughters. 60 pack-year smoking, discontinued 23 years ago.  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  PHYSICAL EXAMINATION:  GENERAL:  He is a pleasant obese gentleman looks his stated age, not in acute distress, in bed on room air.  Wife present and very supportive. VITAL SIGNS:  Temperature 99.2, blood pressure 99/61, heart rate 75 and regular, respirations 18, and 100% saturated on 2 liters oxygen earlier. HEENT:  Pupils  are equal and reactive, nonicteric.  Oral mucosa is moist and no obvious ulcerations or clear candida though this was seen previously.  Posterior pharynx not remarkable.  His radiation markings are at midsternal area. LUNGS:  Have no wheezes or rales. HEART:  Regular rate and rhythm. ABDOMEN:  Soft, quiet, nontender.  I cannot appreciate organomegaly. LOWER EXTREMITIES:  No pitting edema or cords.  He is able to move easily in the bed. NEURO:  Speech is fluent and appropriate.  LABS:  From [facility], November 01, 2010, white count 2.9, ANC 2.3, hemoglobin 10, platelets 124.  Sodium 138, potassium 3.1, chloride  100, CO2 27, glucose 177, BUN 21, creatinine 1.4, total bili 1.1, alk phos 49, GOT 15, GPT 18, total protein 6, albumin 3.2, calcium 9.4.  CBC on admission, white count 3.9, hemoglobin 7.2, platelets 184, this a.m.  CBC, white count 4, hemoglobin 7, platelets 159 and post 1 unit packed red cells, hemoglobin up to 8.  Chemistries today, sodium 137, potassium 3.1, chloride 102, CO2 27, glucose 111, BUN 13, creatinine 1.3.  Anemia panel from September 1, has serum iron of 28, 13% saturated. Fecal occult blood testing ordered.  Chest x-ray is pending and a barium swallow had also been ordered.  IMPRESSION AND RECOMMENDATIONS: 1. IIIB versus IV squamous cell carcinoma of lung in patient with long     past tobacco:  Patient has just completed radiation to the central     chest given with sensitizing chemotherapy.  The esophageal symptoms     are very consistent with radiation esophagitis.  I would suggest     Carafate slurry, viscous lidocaine, and Magic mouth wash p.r.n.     with liquid diet and IV fluids in addition to prevent dehydration.     He may also need pain medication either in liquid form or IV.  I     have ordered the above.  I would not do a barium swallow unless     symptoms do not improve over the next couple of weeks.  I do not     believe that he is having significant aspiration. 2. Anemia.  Hemoglobin had been 11.9 on October 18, 2010, and 10 on     August 20.  This drop may all be secondary to chemotherapy and     radiation, though it is significant drop and note low iron also.  I do     not find report of a PET scan in the hospital EMR, however, Dr.     Asa Lente new patient consult note mentions a PET scan with uptake     in the sigmoid colon of questionable significance.  I agree with     Heme checking stools and with 1 unit of packed red cells given and     would repeat CBC in the a.m.  Would add oral iron when he is able     to swallow pills.  Would have  Gastroenterology see if the diarrhea     does not resolve and otherwise he may need Gastroenterology     evaluation after the acute symptoms have improved.  Certainly he     could have some bleeding from radiation esophagitis, so heme-     positive stools now will not necessarily be lower gastrointestinal     origin. 3. Diarrhea:  I would not expect this with radiation to the chest or     with carboplatin/Taxol.  Evaluation and interventions in process  seem appropriate.  I will let Dr. Arbutus Ped know of the admission.  Please call between our visits if needed.  Time spent 90 minutes.   Reece Packer, M.D.     LPL/MEDQ  D:  11/14/2010  T:  11/15/2010  Job:  [id number]  cc:   Lajuana Matte, M.D.  Oneita Hurt, M.D. Fax: [phone]  Windle Guard, M.D. Fax: [phone]  Dewayne Shorter, MD  Electronically Signed by Jama Flavors M.D. on 11/24/2010 08:49:44 AM

## 2010-11-25 LAB — CBC
HCT: 33.2 % — ABNORMAL LOW (ref 39.0–52.0)
Hemoglobin: 11 g/dL — ABNORMAL LOW (ref 13.0–17.0)
MCH: 29.6 pg (ref 26.0–34.0)
MCV: 89.5 fL (ref 78.0–100.0)
Platelets: 289 10*3/uL (ref 150–400)
RBC: 3.71 MIL/uL — ABNORMAL LOW (ref 4.22–5.81)
RDW: 18 % — ABNORMAL HIGH (ref 11.5–15.5)

## 2010-11-25 LAB — GLUCOSE, CAPILLARY
Glucose-Capillary: 127 mg/dL — ABNORMAL HIGH (ref 70–99)
Glucose-Capillary: 134 mg/dL — ABNORMAL HIGH (ref 70–99)
Glucose-Capillary: 112 mg/dL — ABNORMAL HIGH (ref 70–99)
Glucose-Capillary: 154 mg/dL — ABNORMAL HIGH (ref 70–99)

## 2010-11-25 LAB — BASIC METABOLIC PANEL
Chloride: 102 mEq/L (ref 96–112)
GFR calc Af Amer: >60 mL/min (ref >60)
GFR calc non Af Amer: >60 mL/min (ref >60)
Potassium: 3.8 mEq/L (ref 3.5–5.1)
Sodium: 137 mEq/L (ref 135–145)

## 2010-11-26 LAB — DIFFERENTIAL
Basophils Absolute: 0.1 10*3/uL (ref 0.0–0.1)
Basophils Relative: 1 % (ref 0–1)
Lymphocytes Relative: 12 % (ref 12–46)
Monocytes Absolute: 0.6 10*3/uL (ref 0.1–1.0)
Monocytes Relative: 12 % (ref 3–12)
Neutro Abs: 3.3 10*3/uL (ref 1.7–7.7)
Neutrophils Relative %: 71 % (ref 43–77)

## 2010-11-26 LAB — COMPREHENSIVE METABOLIC PANEL
ALT: 14 U/L (ref 0–53)
Alkaline Phosphatase: 72 U/L (ref 39–117)
BUN: 6 mg/dL (ref 6–23)
CO2: 23 mEq/L (ref 19–32)
GFR calc Af Amer: >60 mL/min (ref >60)
GFR calc non Af Amer: >60 mL/min (ref >60)
Glucose, Bld: 179 mg/dL — ABNORMAL HIGH (ref 70–99)
Potassium: 4.2 mEq/L (ref 3.5–5.1)
Sodium: 135 mEq/L (ref 135–145)
Total Bilirubin: 0.4 mg/dL (ref 0.3–1.2)
Total Protein: 6.9 g/dL (ref 6.0–8.3)

## 2010-11-26 LAB — TRIGLYCERIDES: Triglycerides: 319 mg/dL — ABNORMAL HIGH (ref <150)

## 2010-11-26 LAB — GLUCOSE, CAPILLARY
Glucose-Capillary: 172 mg/dL — ABNORMAL HIGH (ref 70–99)
Glucose-Capillary: 148 mg/dL — ABNORMAL HIGH (ref 70–99)

## 2010-11-26 LAB — PREALBUMIN: Prealbumin: 16 mg/dL — ABNORMAL LOW (ref 17.0–34.0)

## 2010-11-26 LAB — CBC
HCT: 34.1 % — ABNORMAL LOW (ref 39.0–52.0)
Hemoglobin: 11.1 g/dL — ABNORMAL LOW (ref 13.0–17.0)
MCHC: 32.6 g/dL (ref 30.0–36.0)
RBC: 3.83 MIL/uL — ABNORMAL LOW (ref 4.22–5.81)

## 2010-11-26 LAB — MAGNESIUM: Magnesium: 1.7 mg/dL (ref 1.5–2.5)

## 2010-11-27 LAB — BASIC METABOLIC PANEL
BUN: 10 mg/dL (ref 6–23)
Calcium: 9.3 mg/dL (ref 8.4–10.5)
Chloride: 98 mEq/L (ref 96–112)
Creatinine, Ser: 1.11 mg/dL (ref 0.50–1.35)
GFR calc Af Amer: >60 mL/min (ref >60)

## 2010-11-27 LAB — CBC
HCT: 34.2 % — ABNORMAL LOW (ref 39.0–52.0)
MCH: 29.4 pg (ref 26.0–34.0)
MCHC: 33 g/dL (ref 30.0–36.0)
MCV: 88.8 fL (ref 78.0–100.0)
Platelets: 314 10*3/uL (ref 150–400)
RDW: 18 % — ABNORMAL HIGH (ref 11.5–15.5)
WBC: 4.6 10*3/uL (ref 4.0–10.5)

## 2010-11-27 LAB — GLUCOSE, CAPILLARY
Glucose-Capillary: 213 mg/dL — ABNORMAL HIGH (ref 70–99)
Glucose-Capillary: 160 mg/dL — ABNORMAL HIGH (ref 70–99)
Glucose-Capillary: 173 mg/dL — ABNORMAL HIGH (ref 70–99)

## 2010-11-28 LAB — BASIC METABOLIC PANEL
Calcium: 9.5 mg/dL (ref 8.4–10.5)
Creatinine, Ser: 1.03 mg/dL (ref 0.50–1.35)
GFR calc Af Amer: >60 mL/min (ref >60)
GFR calc non Af Amer: >60 mL/min (ref >60)

## 2010-11-28 LAB — GLUCOSE, CAPILLARY: Glucose-Capillary: 235 mg/dL — ABNORMAL HIGH (ref 70–99)

## 2010-11-29 LAB — CBC
HCT: 36.2 % — ABNORMAL LOW (ref 39.0–52.0)
MCHC: 32.3 g/dL (ref 30.0–36.0)
MCV: 89.6 fL (ref 78.0–100.0)
RDW: 18.1 % — ABNORMAL HIGH (ref 11.5–15.5)
WBC: 5.9 10*3/uL (ref 4.0–10.5)

## 2010-11-29 LAB — GLUCOSE, CAPILLARY: Glucose-Capillary: 201 mg/dL — ABNORMAL HIGH (ref 70–99)

## 2010-11-29 LAB — DIFFERENTIAL
Eosinophils Relative: 3 % (ref 0–5)
Lymphocytes Relative: 15 % (ref 12–46)
Lymphs Abs: 0.9 10*3/uL (ref 0.7–4.0)
Monocytes Absolute: 0.7 10*3/uL (ref 0.1–1.0)

## 2010-11-29 LAB — COMPREHENSIVE METABOLIC PANEL
Albumin: 2.7 g/dL — ABNORMAL LOW (ref 3.5–5.2)
BUN: 18 mg/dL (ref 6–23)
Chloride: 98 mEq/L (ref 96–112)
Creatinine, Ser: 1.02 mg/dL (ref 0.50–1.35)
Total Bilirubin: 0.3 mg/dL (ref 0.3–1.2)

## 2010-11-29 LAB — MAGNESIUM: Magnesium: 1.7 mg/dL (ref 1.5–2.5)

## 2010-11-29 LAB — PHOSPHORUS: Phosphorus: 3.4 mg/dL (ref 2.3–4.6)

## 2010-11-29 LAB — TRIGLYCERIDES: Triglycerides: 294 mg/dL — ABNORMAL HIGH (ref <150)

## 2010-11-30 LAB — BASIC METABOLIC PANEL
CO2: 24 mEq/L (ref 19–32)
Calcium: 9.3 mg/dL (ref 8.4–10.5)
GFR calc Af Amer: >60 mL/min (ref >60)
GFR calc non Af Amer: >60 mL/min (ref >60)
Sodium: 133 mEq/L — ABNORMAL LOW (ref 135–145)

## 2010-11-30 LAB — GLUCOSE, CAPILLARY
Glucose-Capillary: 252 mg/dL — ABNORMAL HIGH (ref 70–99)
Glucose-Capillary: 229 mg/dL — ABNORMAL HIGH (ref 70–99)

## 2010-11-30 LAB — HEMOGLOBIN A1C: Mean Plasma Glucose: 131 mg/dL — ABNORMAL HIGH (ref <117)

## 2010-11-30 NOTE — H&P (Signed)
NAME:  [name], [name] NO.:  [id number]  MEDICAL RECORD NO.:  [id number]  LOCATION:  [facility]LED                         FACILITY:  [facility]  PHYSICIAN:  Marcellus Scott, MD     DATE OF BIRTH:  26-Dec-1935  DATE OF ADMISSION:  11/22/2010 DATE OF DISCHARGE:                             HISTORY & PHYSICAL   PRIMARY CARE PHYSICIAN:  Windle Guard, MD  ONCOLOGIST:  Si Gaul, MD  It looks like the patient has two medical record numbers; one with number [id number] under which he was recently admitted between September 1 and September 6 and discharged on September 6, and the current medical record number.  CHIEF COMPLAINTS:  Difficulty and painful swallowing, poor oral intake, generalized weakness.  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  Four to five days after the last radiation therapy, he started complaining of difficulty swallowing and pain on swallowing.  He eventually was admitted between September 1 and September 6 for the same complaints and associated diarrhea and acute renal failure.  His swallowing issues where attributed to radiation esophagitis, rule out fungal esophagitis.  He seemed to clinically do well and he was discharged home on September 6.  The patient, however, returns today with history of continued severe pain on swallowing.  He indicates that even water sometimes can make him have excruciating retrosternal pain.  He has tried drinking potatoes in the form of shakes and puddings.  His oral intake has obviously decreased.  He has tried using Magic mouthwash which numbs his throat and he is able to tolerate some food for a short period of time until the pain returns.  He denies any  history of vomiting or abdominal pain.  He denies fevers or chills. He indicates he has chest pain only when he is eating or drinking.  He indicates dyspnea also when he has the pain.  He has an occasional dry cough.  He had one episode of diarrhea and he took some Imodium and has had no further episodes.  He has progressively become weak.  He called his oncologist who advised him to come to the emergency room.  Here he was found to be dehydrated with a creatinine in the 1.4 range.  The [facility] were requested to admit for further evaluation and management.  PAST MEDICAL HISTORY: 1. 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 2. Remote history of smoking. 3. Type 2 diabetes mellitus. 4. Hypertension. 5. Hyperlipidemia. 6. Recent episode of pancytopenia attributed secondary to     chemotherapy. 7. Diarrhea. 8. Severe protein-calorie malnutrition. 9. Recent episode of acute renal failure. 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  PAST SURGICAL HISTORY:  Tonsillectomy.  ALLERGIES:  PENICILLIN and VICODIN, he is said to be very sensitive, and with HYDROMORPHONE apparently his breathing is disrupted.  HOME MEDICATIONS:  To be a reconciled but from previous discharge  4 days ago: 1. Fluconazole 100 mg p.o. daily. 2. Magic mouthwash 5 mL p.o. q.6 h p.r.n. 3. Glucerna 237 mL p.o. t.i.d. with meals. 4. Protonix 40 mg p.o. b.i.d. 5. Sucralfate 1 g p.o. q.6 h p.r.n. 6. Compazine 10 mg p.o. q.6 h p.r.n. nausea. 7. Latanoprost 0.005%, 1 drop in both eyes at bedtime. 8. Lorazepam 1 mg p.o. t.i.d. p.r.n. nausea. 9. Lortab 7.5/500 mg per 15 mL, 10-15 mL p.o. q.6 h p.r.n. pain. 10.Systane eyedrops 1 drop in both eyes daily. 11.Testosterone cypionate 100 to 200 mg IV every 2 weeks. His oral diabetic medications, anticholesterol medications and antihypertensive medications had been held temporarily over the  previous discharge.  FAMILY HISTORY:  The patient's parents died of cardiac disease in their 52s.  SOCIAL HISTORY:  He is married and lives with his wife.  One of his daughters is at his bedside.  He is an ex-smoker but discontinued 23 years ago.  He is a retired, [employer] and a retired Paediatric nurse.  He is still independent of activities of daily living.  There is no history of alcohol or drug abuse.  ADVANCE DIRECTIVES:  The patient is full code.  REVIEW OF SYSTEMS:  All systems reviewed and apart from history of presenting illness, are negative.  PHYSICAL EXAMINATION:  GENERAL:  Mr. [name] is a moderately-built and obese male patient who is in mild intermittent painful distress on swallowing.  VITAL SIGNS:  Temperature 97.4 degrees Fahrenheit; blood pressure 116/66 mmHg; pulse 77 per minute, regular; respirations 24 per minute and saturating at 99% on room air. HEENT:  Normocephalic, nontraumatic.  Pupils equally reacting to light and accommodation with oral mucosa is moist.  Mildly reddened throat but no drainage or lesions appreciated. NECK:  Supple.  No JVD or carotid bruit. LYMPHATICS:  No lymphadenopathy. RESPIRATORY:  Clear and no increased work of breathing. CARDIOVASCULAR:  First and second heart sounds heard, regular.  No JVD or murmurs. ABDOMEN:  Nondistended, nontender, soft.  Normal bowel sounds heard.  No organomegaly or mass appreciated. NEUROLOGIC:  The patient is awake, alert, oriented x3 with no focal neurological deficits. EXTREMITIES:  With grade 5/5 power. SKIN:  The patient has features of radiation dermatitis on his upper back bilaterally.  LABORATORY DATA:  Urinalysis was negative for features of urinary tract infection. Comprehensive metabolic panel shows sodium 134, glucose 165, BUN 12, creatinine 1.52, and albumin of 2.8.  CBC:  Hemoglobin 12, hematocrit 36, white blood cell 5.5, platelets 348.  ASSESSMENT AND PLAN: 1. Severe dysphagia and  odynophagia possibly secondary to continued     radiation esophagitis versus rule out other causes such as Candida     esophagitis.  We will admit the patient to oncology floor, provide     IV Protonix, p.o. Carafate slurry, and p.o. viscous lidocaine.     [facility] has been consulted and recommend keeping him     n.p.o. after midnight to consider EGD in the morning to evaluate     for causes of his symptoms. 2. Dehydration and failure to thrive secondary to poor oral intake:     IV fluids.  The patient wishes to continue to try the soft diet. 3. Mild acute renal insufficiency secondary to poor oral intake and     associated dehydration:  IV fluids and monitor B-MET daily. 4. Type 2 diabetes mellitus:  Continue sliding scale insulin. 5. Hypertension:  The patient is normotensive without any medications     at this time. 6.  Stage IIIB/IV squamous cell carcinoma:  This dictator discussed     with his primary oncologist who agrees with obtaining a     gastroenterology consultation. 7. Full code status.  Time taken in coordinating this history and physical note is 60 minutes.     Marcellus Scott, MD     AH/MEDQ  D:  11/22/2010  T:  11/22/2010  Job:  [id number]  cc:   Lajuana Matte, M.D.  Windle Guard, M.D. Fax: [phone]  Electronically Signed by Marcellus Scott MD on 11/30/2010 08:07:57 PM

## 2010-11-30 NOTE — Discharge Summary (Signed)
[name], STRAUGHTER NO.:  [id number]  MEDICAL RECORD NO.:  [id number]  LOCATION:  1506                         FACILITY:  [facility]  PHYSICIAN:  Marcellus Scott, MD     DATE OF BIRTH:  1936/01/05  DATE OF ADMISSION:  11/13/2010 DATE OF DISCHARGE:  11/18/2010                              DISCHARGE SUMMARY   PRIMARY ONCOLOGIST:  Lajuana Matte, M.D.  DISCHARGE DIAGNOSES: 1. Radiation-induced esophagitis versus fungal esophagitis. 2. Anemia/pancytopenia secondary to chemotherapy. 3. Type 2 diabetes mellitus. 4. Diarrhea, resolved. 5. Stage IIIB/IV Squamous cell carcinoma. 6. Severe protein-calorie malnutrition. 7. Hypertension. 8. Hypokalemia, repleted. 9. Acute renal failure, resolved. 10.Dermatitis or ulcer post radiation on the back.  DISCHARGE MEDICATIONS: 1. Fluconazole 100 mg p.o. daily. 2. Magic mouth wash 5 mL p.o. q.6 h. p.r.n. for sore throat, mouth or     throat. 3. Glucerna 237 mL p.o. t.i.d. with meals. 4. Protonix 40 mg p.o. b.i.d. 5. Sucralfate 1 g p.o. q. 6 hourly in suspension form. 6. Compazine 10 mg p.o. q.6 hourly p.r.n. nausea. 7. Latanoprost 0.005% 1 drop in both eyes q.h.s. 8. Lorazepam 1 mg p.o. t.i.d. p.r.n. nausea. 9. Lortab 7.5/500 mg per 15 mL, 10-15 mL p.o. q.6 hourly p.r.n. pain. 10.Systane eyedrops 1 drop in both eyes daily. 11.Testosterone cypionate 100-200 mg IM every 2 weeks.  DISCONTINUED MEDICATIONS:  Glimepiride, lovastatin, niacin, multivitamins, fish oil, amlodipine, Actos, chemotherapy regimen.  IMAGING:  Chest x-ray September 2 impression small pleural effusions. No definite evidence of any active pulmonary disease.  Nonspecific area of increased density in the infrahilar region noted on the lateral view only.  LABORATORY DATA:  Fecal occult blood testing negative.  CBC:  Hemoglobin 9.8, hematocrit 29.8, white blood cell 4.4, platelets 209.  C diff by PCR negative.  Basic metabolic panel within normal  limits.  Hemoglobin A1c of 6.6.  Urinalysis negative.  Hepatic panel significant for total protein 5.9, albumin 2.4.  Anemia panel showed vitamin B12 435, serum folate 15.4, ferritin 605, magnesium was 2.3, hemoglobin on September 1 of 7.2, white blood cell 3.9.  CONSULTATIONS:  Oncology, Lajuana Matte, M.D. and Reece Packer, M.D.  ACTIVITY:  Is ad lib.  DIET:  Liquid and soft diet.  HIS TODAY'S COMPLAINTS:  The patient is able to tolerate liquid diet and a soft diet and he is able to take more each day.  He still has occasional intermittent retrosternal pain, which is transient, lasts up to a minute or two.  It also varies with consistency of food.  No diarrhea.  PHYSICAL EXAMINATION:  GENERAL:  The patient is in no obvious distress. VITAL SIGNS:  Temperature 97.7, pulse 67 per minute, respiration 18 per minute, blood pressure 122/55 mmHg, and saturating at 100% on room air. CBGs ranged from 95-141 mg/dL with no hypoglycemic agents. RESPIRATORY SYSTEM:  Clear. CARDIOVASCULAR SYSTEM:  S1 and S2 heart sounds heard regular. ABDOMEN:  Nondistended, soft, and normal bowel sounds heard. CENTRAL NERVOUS SYSTEM: Patient is awake, alert, oriented x3 with no focal neurological deficits.  HOSPITAL COURSE:  [name] is a 75 year old male patient with squamous cell lung cancer who completed last chemotherapy on November 01, 2010, and last radiation therapy on November 05, 2010.  He presented to the emergency room with dysphagia.  He was given GI cocktail and was discharged home when he felt better.  He now returned with ongoing diarrhea for 4 days, minimal p.o. intake, and no solid intake secondary to dysphagia with foods.  He also complained of weakness and dizziness.  1. Dysphagia and pain, most likely suggestive of radiation     esophagitis.  Other differential diagnosis is fungal esophagitis.     The patient was treated empirically with proton pump inhibitors,     sucralfate,  Diflucan.  With these measures, the patient has slowly     done well and is able to tolerate some liquids and soft diet.  His     diet can be advanced gradually as tolerated.  He will complete a     total 3 weeks of Diflucan.  However, if dysphagia is not improving     or gets worse, then consider further evaluation which may involve a     GI consultation for upper endoscopy or a barium swallow. 2. Hypertension.  The patient was orthostatic hypotensive by history     on admission.  This has resolved.  His blood pressures continued to     be reasonably controlled in the 100s to 110s over 60s to 70s     without any of his oral medications.  These can be resumed once his     blood pressure starts climbing up. 3. Anemia and leukopenia.  Likely secondary to his recent     chemoradiation.  The patient was transfused packed red blood cells     with appropriate response.  This can be followed as an outpatient. 4. Diarrhea, possibly secondary to chemotherapy, resolved. 5. Acute renal failure with creatinine of 1.6 on admission likely     secondary to poor oral intake and diarrhea.  This has resolved. 6. Ulcer on the right side of his back.  Wound care as per     instructions provided and this is on the scapular region. 7. Hypokalemia, repleted. 8. Type 2 diabetes mellitus.  As indicated, his blood sugars are well     controlled without any medications at this time, but his blood     sugars will be closely monitored at home and once his blood sugar     starts increasing then consider resuming some of his medications     and then gradually titrate up as deemed necessary.  DISPOSITION:  The patient is discharged home in stable condition.  Follow up recommendations with Dr. Si Gaul.  The patient is to call for an appointment to be seen in 5-7 days.   Time taken in coordinating this discharge is 45 minutes.     Marcellus Scott, MD     AH/MEDQ  D:  11/18/2010  T:  11/18/2010  Job:   [id number]  cc:   Lajuana Matte, M.D.  Electronically Signed by Marcellus Scott MD on 11/30/2010 03:07:01 PM

## 2010-12-01 ENCOUNTER — Other Ambulatory Visit (HOSPITAL_COMMUNITY): Payer: Medicare Other

## 2010-12-01 LAB — GLUCOSE, CAPILLARY
Glucose-Capillary: 226 mg/dL — ABNORMAL HIGH (ref 70–99)
Glucose-Capillary: 248 mg/dL — ABNORMAL HIGH (ref 70–99)
Glucose-Capillary: 238 mg/dL — ABNORMAL HIGH (ref 70–99)
Glucose-Capillary: 244 mg/dL — ABNORMAL HIGH (ref 70–99)

## 2010-12-01 LAB — CBC
Platelets: 294 10*3/uL (ref 150–400)
RDW: 18.2 % — ABNORMAL HIGH (ref 11.5–15.5)
WBC: 6.9 10*3/uL (ref 4.0–10.5)

## 2010-12-01 LAB — DIFFERENTIAL
Basophils Absolute: 0.1 10*3/uL (ref 0.0–0.1)
Basophils Relative: 2 % — ABNORMAL HIGH (ref 0–1)
Eosinophils Absolute: 0.2 10*3/uL (ref 0.0–0.7)
Lymphocytes Relative: 12 % (ref 12–46)
Neutrophils Relative %: 72 % (ref 43–77)

## 2010-12-01 LAB — COMPREHENSIVE METABOLIC PANEL
Alkaline Phosphatase: 67 U/L (ref 39–117)
BUN: 21 mg/dL (ref 6–23)
Calcium: 10.1 mg/dL (ref 8.4–10.5)
GFR calc non Af Amer: >60 mL/min (ref >60)
Glucose, Bld: 225 mg/dL — ABNORMAL HIGH (ref 70–99)
Total Protein: 6.9 g/dL (ref 6.0–8.3)

## 2010-12-01 LAB — TYPE AND SCREEN: ABO/RH(D): A POS

## 2010-12-01 LAB — PROTIME-INR
INR: 1.14 (ref 0.00–1.49)
Prothrombin Time: 14.8 seconds (ref 11.6–15.2)

## 2010-12-01 LAB — ABO/RH: ABO/RH(D): A POS

## 2010-12-02 LAB — COMPREHENSIVE METABOLIC PANEL
ALT: 11 U/L (ref 0–53)
AST: 14 U/L (ref 0–37)
Alkaline Phosphatase: 72 U/L (ref 39–117)
CO2: 22 mEq/L (ref 19–32)
Chloride: 98 mEq/L (ref 96–112)
GFR calc non Af Amer: >60 mL/min (ref >60)
Sodium: 131 mEq/L — ABNORMAL LOW (ref 135–145)
Total Bilirubin: 0.4 mg/dL (ref 0.3–1.2)

## 2010-12-02 LAB — CBC
Hemoglobin: 12.3 g/dL — ABNORMAL LOW (ref 13.0–17.0)
Platelets: 295 10*3/uL (ref 150–400)
RBC: 4.15 MIL/uL — ABNORMAL LOW (ref 4.22–5.81)
WBC: 9.5 10*3/uL (ref 4.0–10.5)

## 2010-12-02 LAB — GLUCOSE, CAPILLARY
Glucose-Capillary: 268 mg/dL — ABNORMAL HIGH (ref 70–99)
Glucose-Capillary: 232 mg/dL — ABNORMAL HIGH (ref 70–99)
Glucose-Capillary: 243 mg/dL — ABNORMAL HIGH (ref 70–99)

## 2010-12-02 LAB — DIFFERENTIAL
Basophils Relative: 1 % (ref 0–1)
Eosinophils Absolute: 0.2 10*3/uL (ref 0.0–0.7)
Lymphs Abs: 1 10*3/uL (ref 0.7–4.0)
Neutro Abs: 7.3 10*3/uL (ref 1.7–7.7)
Neutrophils Relative %: 78 % — ABNORMAL HIGH (ref 43–77)

## 2010-12-03 LAB — COMPREHENSIVE METABOLIC PANEL
AST: 13 U/L (ref 0–37)
Albumin: 2.5 g/dL — ABNORMAL LOW (ref 3.5–5.2)
Alkaline Phosphatase: 66 U/L (ref 39–117)
BUN: 22 mg/dL (ref 6–23)
Chloride: 101 mEq/L (ref 96–112)
Potassium: 3.7 mEq/L (ref 3.5–5.1)
Sodium: 135 mEq/L (ref 135–145)
Total Bilirubin: 0.5 mg/dL (ref 0.3–1.2)
Total Protein: 6.3 g/dL (ref 6.0–8.3)

## 2010-12-03 LAB — GLUCOSE, CAPILLARY
Glucose-Capillary: 216 mg/dL — ABNORMAL HIGH (ref 70–99)
Glucose-Capillary: 265 mg/dL — ABNORMAL HIGH (ref 70–99)

## 2010-12-03 LAB — DIFFERENTIAL
Basophils Absolute: 0.1 10*3/uL (ref 0.0–0.1)
Basophils Relative: 1 % (ref 0–1)
Eosinophils Absolute: 0.3 10*3/uL (ref 0.0–0.7)
Eosinophils Relative: 4 % (ref 0–5)
Lymphocytes Relative: 11 % — ABNORMAL LOW (ref 12–46)
Monocytes Absolute: 0.9 10*3/uL (ref 0.1–1.0)

## 2010-12-03 LAB — MAGNESIUM: Magnesium: 1.8 mg/dL (ref 1.5–2.5)

## 2010-12-03 LAB — CBC
MCHC: 33.7 g/dL (ref 30.0–36.0)
Platelets: 233 10*3/uL (ref 150–400)
RDW: 18.4 % — ABNORMAL HIGH (ref 11.5–15.5)
WBC: 7.8 10*3/uL (ref 4.0–10.5)

## 2010-12-04 LAB — COMPREHENSIVE METABOLIC PANEL
ALT: 12 U/L (ref 0–53)
AST: 17 U/L (ref 0–37)
Albumin: 2.6 g/dL — ABNORMAL LOW (ref 3.5–5.2)
Alkaline Phosphatase: 72 U/L (ref 39–117)
Calcium: 9.8 mg/dL (ref 8.4–10.5)
Potassium: 4.5 mEq/L (ref 3.5–5.1)
Sodium: 135 mEq/L (ref 135–145)
Total Protein: 6.6 g/dL (ref 6.0–8.3)

## 2010-12-04 LAB — CBC
MCH: 29.3 pg (ref 26.0–34.0)
MCHC: 32.4 g/dL (ref 30.0–36.0)
Platelets: 229 10*3/uL (ref 150–400)
RBC: 3.75 MIL/uL — ABNORMAL LOW (ref 4.22–5.81)

## 2010-12-04 LAB — DIFFERENTIAL
Basophils Absolute: 0.1 10*3/uL (ref 0.0–0.1)
Basophils Relative: 1 % (ref 0–1)
Eosinophils Absolute: 0.3 10*3/uL (ref 0.0–0.7)
Monocytes Absolute: 0.8 10*3/uL (ref 0.1–1.0)
Monocytes Relative: 8 % (ref 3–12)
Neutro Abs: 7.6 10*3/uL (ref 1.7–7.7)
Neutrophils Relative %: 79 % — ABNORMAL HIGH (ref 43–77)

## 2010-12-04 LAB — GLUCOSE, CAPILLARY
Glucose-Capillary: 205 mg/dL — ABNORMAL HIGH (ref 70–99)
Glucose-Capillary: 190 mg/dL — ABNORMAL HIGH (ref 70–99)
Glucose-Capillary: 178 mg/dL — ABNORMAL HIGH (ref 70–99)
Glucose-Capillary: 184 mg/dL — ABNORMAL HIGH (ref 70–99)

## 2010-12-05 LAB — COMPREHENSIVE METABOLIC PANEL
ALT: 11 U/L (ref 0–53)
Albumin: 2.4 g/dL — ABNORMAL LOW (ref 3.5–5.2)
Calcium: 9.2 mg/dL (ref 8.4–10.5)
GFR calc Af Amer: >60 mL/min (ref >60)
Glucose, Bld: 157 mg/dL — ABNORMAL HIGH (ref 70–99)
Sodium: 136 mEq/L (ref 135–145)
Total Protein: 6.3 g/dL (ref 6.0–8.3)

## 2010-12-05 LAB — DIFFERENTIAL
Basophils Relative: 1 % (ref 0–1)
Eosinophils Absolute: 0.4 10*3/uL (ref 0.0–0.7)
Eosinophils Relative: 5 % (ref 0–5)
Monocytes Absolute: 0.9 10*3/uL (ref 0.1–1.0)
Monocytes Relative: 10 % (ref 3–12)
Neutro Abs: 6.4 10*3/uL (ref 1.7–7.7)

## 2010-12-05 LAB — CBC
Hemoglobin: 10.7 g/dL — ABNORMAL LOW (ref 13.0–17.0)
MCH: 30.2 pg (ref 26.0–34.0)
MCHC: 33.5 g/dL (ref 30.0–36.0)
RDW: 18.9 % — ABNORMAL HIGH (ref 11.5–15.5)

## 2010-12-05 LAB — GLUCOSE, CAPILLARY
Glucose-Capillary: 164 mg/dL — ABNORMAL HIGH (ref 70–99)
Glucose-Capillary: 101 mg/dL — ABNORMAL HIGH (ref 70–99)

## 2010-12-05 LAB — MAGNESIUM: Magnesium: 2 mg/dL (ref 1.5–2.5)

## 2010-12-06 LAB — GLUCOSE, CAPILLARY

## 2010-12-06 LAB — COMPREHENSIVE METABOLIC PANEL
ALT: 16 U/L (ref 0–53)
Alkaline Phosphatase: 72 U/L (ref 39–117)
CO2: 28 mEq/L (ref 19–32)
Chloride: 99 mEq/L (ref 96–112)
GFR calc Af Amer: >60 mL/min (ref >60)
GFR calc non Af Amer: >60 mL/min (ref >60)
Glucose, Bld: 115 mg/dL — ABNORMAL HIGH (ref 70–99)
Potassium: 4.2 mEq/L (ref 3.5–5.1)
Sodium: 134 mEq/L — ABNORMAL LOW (ref 135–145)
Total Bilirubin: 0.3 mg/dL (ref 0.3–1.2)

## 2010-12-06 LAB — DIFFERENTIAL
Basophils Relative: 1 % (ref 0–1)
Eosinophils Relative: 6 % — ABNORMAL HIGH (ref 0–5)
Monocytes Relative: 12 % (ref 3–12)
Neutrophils Relative %: 70 % (ref 43–77)

## 2010-12-06 LAB — CBC
Hemoglobin: 10.4 g/dL — ABNORMAL LOW (ref 13.0–17.0)
RBC: 3.44 MIL/uL — ABNORMAL LOW (ref 4.22–5.81)

## 2010-12-06 NOTE — Progress Notes (Signed)
NAME:  [name], [name] NO.:  [id number]  MEDICAL RECORD NO.:  [id number]  LOCATION:                               FACILITY:  [facility]  PHYSICIAN:  Talmage Nap, MD  DATE OF BIRTH:  [birth year]                                PROGRESS NOTE   Date of discharge to be determined by the rounding physician.  PRIMARY CARE PHYSICIAN: Windle Guard, M.D.  ONCOLOGIST: Velora Heckler. Arbutus Ped, M.D.  RADIO-ONCOLOGIST: Artist Pais. Kathrynn Running, M.D.  CONSULTANTS: Involving the case: 1. Gastroenterology, Willis Modena, MD/Marc Shanon Ace, M.D. 2. Radio-Oncology, Oneita Hurt, M.D. 3. Pharmacy for TPN management. 4. Registered dietitian.  WORKING DIAGNOSES: 1. Odynophagia/dysphagia, most likely secondary to radiation,     esophagitis, and questionable herpes simplex. 2. Protein energy malnutrition/odynophagia/dysphagia, status post     percutaneous endoscopic gastrostomy tube placement. 3. History of C3-C4 squamous cell cancer of the lung, status post     chemoradiotherapy. 4. Electrolyte imbalance - hypomagnesemia andhypokalemia.     corrected. 5. Type 2 diabetes mellitus. 6. Hypertension. 7. Tachycardia. 8. Hyperlipidemia.  Please for initial management of this patient from admission, refer to the progress note dictated by Dr. Hillery Aldo on November 27, 2010.  The patient was, however, seen by me for the very first time in this admission on November 30, 2010, and during this encounter, no major changes were made in the patient's management; however, the patient during this period was found to be tachycardic and his tachycardia was controlled by Lopressor 50 mg p.o. b.i.d.  The patient also was agreeable to having a PEG tube inserted because of the odynophagia and then this procedure was done by Dr. Ewing Schlein and the patient tolerated the procedure.  Post procedure, however, the patient was started on bolus feeds of Glucerna by a PEG tube, which apparently he did  not tolerate, and this was subsequently discontinued, and the patient is now on Glucerna, continuous feeds via PEG tube.  He attempted p.o. feeds, but continued to have pain and this was subsequently discontinued.  The patient, in addition, was also given carafate slurry by mouth.  During PEG tube feeding, the patient was started on tapered doses of TPN and this will be discontinued today, which is 12/05/2010. Clinically, the patient is stable and the plan is to continue Glucerna by PEG continuous feeds, and this should be adjusted appropriately and  electrolyte imbalances will be corrected.  MEDICATIONS: The patient's current meds include the following: 1. Artificial tears, ophthalmic 1 drop both eyes daily. 2. TPN, adjusted by pharmacy. 3. NovoLog insulin 1 to 15 units subcutaneously. 4. Lantus insulin 20 units subcutaneously q.12 h. 5. Xalatan ophthalmic 1 drop both eyes nightly. 6. Metoprolol 50 mg p.o. b.i.d. 7. Pantoprazole 40 mg p.o. b.i.d. 8. Carafate 1 g p.o. q.i.d. 9. Zofran 4 mg IV q.6 h. p.r.n.  The patient will be followed and evaluated on day-to-day basis and the time of discharge will be determined by the rounding physician.     Talmage Nap, MD     CN/MEDQ  D:  12/05/2010  T:  12/05/2010  Job:  [id number]  Electronically Signed by Talmage Nap  on 12/06/2010 03:39:06  PM

## 2010-12-07 LAB — COMPREHENSIVE METABOLIC PANEL
Albumin: 2.6 g/dL — ABNORMAL LOW (ref 3.5–5.2)
BUN: 17 mg/dL (ref 6–23)
CO2: 28 mEq/L (ref 19–32)
Chloride: 101 mEq/L (ref 96–112)
Creatinine, Ser: 1.17 mg/dL (ref 0.50–1.35)
GFR calc non Af Amer: >60 mL/min (ref >60)
Total Bilirubin: 0.4 mg/dL (ref 0.3–1.2)

## 2010-12-07 LAB — DIFFERENTIAL
Eosinophils Relative: 6 % — ABNORMAL HIGH (ref 0–5)
Lymphocytes Relative: 16 % (ref 12–46)
Lymphs Abs: 0.9 10*3/uL (ref 0.7–4.0)
Monocytes Relative: 8 % (ref 3–12)

## 2010-12-07 LAB — GLUCOSE, CAPILLARY
Glucose-Capillary: 125 mg/dL — ABNORMAL HIGH (ref 70–99)
Glucose-Capillary: 128 mg/dL — ABNORMAL HIGH (ref 70–99)
Glucose-Capillary: 137 mg/dL — ABNORMAL HIGH (ref 70–99)

## 2010-12-07 LAB — CBC
HCT: 31 % — ABNORMAL LOW (ref 39.0–52.0)
MCV: 90.6 fL (ref 78.0–100.0)
RDW: 19.2 % — ABNORMAL HIGH (ref 11.5–15.5)
WBC: 5.9 10*3/uL (ref 4.0–10.5)

## 2010-12-07 LAB — CARDIAC PANEL(CRET KIN+CKTOT+MB+TROPI)
CK, MB: 2 ng/mL (ref 0.3–4.0)
Relative Index: INVALID (ref 0.0–2.5)
Total CK: 26 U/L (ref 7–232)
Troponin I: <0.3 ng/mL (ref <0.30)

## 2010-12-07 LAB — MAGNESIUM: Magnesium: 2 mg/dL (ref 1.5–2.5)

## 2010-12-08 LAB — GLUCOSE, CAPILLARY
Glucose-Capillary: 126 mg/dL — ABNORMAL HIGH (ref 70–99)
Glucose-Capillary: 167 mg/dL — ABNORMAL HIGH (ref 70–99)

## 2010-12-08 LAB — COMPREHENSIVE METABOLIC PANEL
AST: 17 U/L (ref 0–37)
Albumin: 2.7 g/dL — ABNORMAL LOW (ref 3.5–5.2)
Alkaline Phosphatase: 73 U/L (ref 39–117)
BUN: 16 mg/dL (ref 6–23)
Creatinine, Ser: 1.04 mg/dL (ref 0.50–1.35)
Potassium: 4.1 mEq/L (ref 3.5–5.1)
Total Protein: 6.8 g/dL (ref 6.0–8.3)

## 2010-12-08 LAB — CBC
Hemoglobin: 10.3 g/dL — ABNORMAL LOW (ref 13.0–17.0)
MCHC: 32.7 g/dL (ref 30.0–36.0)
MCV: 91.6 fL (ref 78.0–100.0)
Platelets: 206 10*3/uL (ref 150–400)
RBC: 3.44 MIL/uL — ABNORMAL LOW (ref 4.22–5.81)
RDW: 19.3 % — ABNORMAL HIGH (ref 11.5–15.5)

## 2010-12-08 LAB — DIFFERENTIAL
Lymphs Abs: 0.9 10*3/uL (ref 0.7–4.0)
Monocytes Relative: 9 % (ref 3–12)
Neutro Abs: 4.6 10*3/uL (ref 1.7–7.7)
Neutrophils Relative %: 70 % (ref 43–77)

## 2010-12-08 LAB — MAGNESIUM: Magnesium: 2.1 mg/dL (ref 1.5–2.5)

## 2010-12-08 NOTE — Consult Note (Signed)
NAMEFELTON, BUCZYNSKI NO.:  [id number]  MEDICAL RECORD NO.:  [id number]  LOCATION:  1309                         FACILITY:  [facility]  PHYSICIAN:  Willis Modena, MD     DATE OF BIRTH:  05/14/1935  DATE OF CONSULTATION:  11/23/2010 DATE OF DISCHARGE:                                CONSULTATION   REASON FOR CONSULTATION:  Odynophagia.  CONSULTING PHYSICIAN:  Dr. Shirline Frees with the [facility] as well as Dr. Waymon Amato with the [facility].  CHIEF COMPLAINT:  Chest pain.  HISTORY OF PRESENT ILLNESS:  Mr. Jain is a 75 year old gentleman with history of stage IIIB/IV squamous cell lung cancer of the left lower lobe.  He has had chemotherapy as well as radiation treatment, with his last treatment about 3 weeks ago.  Soon after his last radiation treatment, he began having progressive odynophagia.  He describes intense burning pain of his chest after eating and after swallowing his own saliva.  When he is not swallowing, he has no pain.  He has no dysphagia.  Empiric trials of viscous lidocaine, Carafate, and PPI have transiently improved his symptoms, but has not had a durable effect.  Past medical history, past surgical history, home medications, allergies, family history, social history, review of systems, all from dictated note of Dr. Waymon Amato, dated November 22, 2010 , are reviewed and I agreed.  PHYSICAL EXAMINATION:  VITAL SIGNS:  Blood pressure 133/75, heart rate 83, respiratory rate 18, temperature 97.4. GENERAL:  Mr. Elie is nontoxic-appearing, not acutely ill. ENT:  Normocephalic, atraumatic.  Fair dentition. EYES:  Sclerae anicteric.  Conjunctivae pink. NECK:  Thick, but supple without thyromegaly, lymphadenopathy. LUNGS:  Diffusely diminished breath sounds.  No focal findings. HEART:  Regular rhythm, normal rate, without murmur or gallop. ABDOMEN:  Protuberant, soft, nontender, nondistended.  Normoactive bowel sounds.  No liver or splenic  enlargement.  No bulging flanks to suggest ascites. NEUROLOGIC:  Diffusely weak, but nonfocal without lateralizing signs. PSYCHIATRIC:  Normal mood and affect. SKIN:  Has radiation changes of his upper back.  LABORATORY STUDIES:  Hemoglobin is 10.9, white count is 4.9, platelet count is 271.  Sodium 136, potassium 3.4, chloride 102, bicarb 23, BUN 12, creatinine 1.24.  Liver tests other than the low albumin are normal.  RADIOLOGIC:  Chest x-ray done several weeks ago shows COPD with decrease in size of the left lower lobe lung mass.  IMPRESSION:  Mr. Burdell is a 75 year old gentleman, presenting with failure to thrive, dehydration, and odynophagia.  I suspect radiation esophagitis.  An atypical infectious esophagitis (such as CMV or HSV) cannot be definitively excluded.  He has not had any appreciable long- term benefit with empiric trials of multiple medications.  PLAN: 1. I agree with supportive management for the time being with viscous     lidocaine, Carafate, and PPI.  He has apparently failed a trial of     fluconazole in the past. 2. We will proceed with upper endoscopy tomorrow for further     evaluation.     Willis Modena, MD     WO/MEDQ  D:  11/23/2010  T:  11/23/2010  Job:  [id number]  Electronically Signed  by Willis Modena  on 12/08/2010 05:54:00 PM

## 2010-12-09 LAB — BASIC METABOLIC PANEL
CO2: 27 mEq/L (ref 19–32)
Calcium: 9.6 mg/dL (ref 8.4–10.5)
GFR calc non Af Amer: >60 mL/min (ref >60)
Potassium: 4 mEq/L (ref 3.5–5.1)
Sodium: 138 mEq/L (ref 135–145)

## 2010-12-09 LAB — GLUCOSE, CAPILLARY
Glucose-Capillary: 185 mg/dL — ABNORMAL HIGH (ref 70–99)
Glucose-Capillary: 170 mg/dL — ABNORMAL HIGH (ref 70–99)

## 2010-12-09 NOTE — Progress Notes (Signed)
NAMEHAIDAR, [name] NO.:  [id number]  MEDICAL RECORD NO.:  [id number]  LOCATION:  1309                         FACILITY:  [facility]  PHYSICIAN:  Hillery Aldo, M.D.   DATE OF BIRTH:  11-May-1935                                PROGRESS NOTE   PRIMARY CARE PHYSICIAN: Windle Guard, M.D.  ONCOLOGIST: Lajuana Matte, M.D.  RADIATION ONCOLOGIST: Oneita Hurt, M.D.  CURRENT DIAGNOSES: 1. 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 2. 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 Arbutus Ped. 3. Severe protein calorie malnutrition. 4. Acute renal failure. 5. Hypokalemia, resolved. 6. Type 2 diabetes. 7. Hypertension. 8. Hyperlipidemia.  DISCHARGE MEDICATIONS: Will be dictated at the time of actual discharge.  CONSULTATIONS: 1. Dr. Willis Modena of Gastroenterology. 2. Dr. Kathrynn Running of Radiation Oncology. 3. Pharmacy for [facility] management. 4. Registered dietitian.  BRIEF ADMISSION HPI: The patient is a 75 year old male who presented to the hospital with chief complaint of odynophagia, poor oral intake and generalized weakness.  He is status post radiation therapy as well as chemotherapy for treatment of stage IIIB/IV lung cancer.  His last radiation therapy treatment was done on August 24 and his last chemotherapy treatment was done approximately 3 weeks prior to presentation.  Approximately 4-5 days after his last radiation treatment, he began to have difficulty swallowing and odynophagia.  He ultimately was admitted to the hospital September 1 through September 6 with similar complaints and acute renal failure related to decreased p.o. intake.  After some subjective improvement, he was discharged home on September 6, but did poorly postdischarge with severe pain on swallowing and decreased oral intake and he subsequently presented  to the hospital for further evaluation and treatment and was subsequently referred to the hospitalist service for further evaluation.  For the full details, please see the dictated report done by Dr. Waymon Amato.  PROCEDURES AND DIAGNOSTIC STUDIES: Upper endoscopy performed by Dr. Odelia Gage on November 24, 2010 showed severe discrete confluent inflammation of the mid esophagus with appearance highly typical of radiation esophagitis.  Biopsies were obtained.  There was mild antral and bulbar duodenitis with duodenal edema, likely reflective of malnourished state.  Recommendations were to follow up on the biopsy and to continue supportive therapies with Carafate, viscous lidocaine and PPI therapy.  DISCHARGE LABORATORY VALUES: Will be dictated at the time of actual discharge.  HOSPITAL COURSE BY PROBLEM: 1. 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     Because of diagnostic uncertainty regarding whether or not he might     have radiation esophagitis versus fungal esophagitis versus viral     esophagitis, a GI consultation was obtained for consideration of     upper endoscopy screening.  The patient did undergo an upper     endoscopy with findings as noted above.  Biopsies did show evidence     of herpes infection and he  was started on acyclovir on November 26, 2010.  At this point, the patient has had minimal symptomatic     improvement and is not yet ready for discharge.  He was also seen     and evaluated by Dr. Kathrynn Running who recommended supporting his     nutritional state for decreased recovery time. 2. 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  His     oncologist was notified of his admission, but his oncologist is     currently out of  town.  He was seen by Dr. Kathrynn Running.  He can follow     up with both of these physicians postdischarge once he is stable. 3. Severe protein calorie malnutrition:  Attempts at p.o. feeding were     not sufficient to meet the patient's nutritional needs.  Despite     supplementation with Ensure shakes and puddings, he was unable to     take in significant amounts of p.o.  He declined placement of a     feeding tube for nutritional purposes and ultimately TNA was     started.  He has been on TNA since November 25, 2010 and we will     reevaluate the need for further TNA in the next 48 hours.  If he     will need long-term nutritional support, he will need to be     encouraged to allow [place] to place a feeding tube for nutritional     purposes.  He has been appraised that TNA is a short-term solution. 4. Acute renal failure:  Secondary to decreased p.o. intake.  The     patient's renal failure has resolved with IV fluid hydration. 5. Type 2 diabetes:  The patient's glycemic control has been good     until TNA was started.  His sugars have come up with initiation of     TNA therapy.  At this point, he has been placed on q.4 hour sliding     scale insulin, moderate scale, and we are adding Lantus to achieve     better glycemic control. 6. Hypertension:  The patient's blood pressure has been stable. 7. Hypokalemia:  The patient's potassium has been repleted. 8. Hyperlipidemia:  The patient does have elevated lipids with a total     cholesterol of 222 and a triglyceride level of 319.  Consideration     for initiation of therapy to address this can be made once he is     able to take p.o.'s adequately.  DISPOSITION: The patient is not yet medically stable for discharge.  Discharge summary addendum will be dictated at the time of actual discharge.     Hillery Aldo, M.D.     CR/MEDQ  D:  11/27/2010  T:  11/27/2010  Job:  [id number]  cc:   Windle Guard, M.D. Fax: [phone]  Lajuana Matte, M.D.  Oneita Hurt, M.D. Fax: [phone]  Electronically Signed by Hillery Aldo M.D. on 12/09/2010 05:55:52 PM

## 2010-12-21 NOTE — Discharge Summary (Signed)
NAMEFORTUNATO, NORDIN NO.:  [id number]  MEDICAL RECORD NO.:  [id number]  LOCATION:                                 FACILITY:  PHYSICIAN:  Marinda Elk, M.D.DATE OF BIRTH:  1935/07/28  DATE OF ADMISSION:  11/22/2010 DATE OF DISCHARGE:  12/09/2010                              DISCHARGE SUMMARY   PRIMARY CARE PHYSICIAN:  Windle Guard, M.D.  ONCOLOGIST:  Lajuana Matte, M.D.  RADIO-ONCOLOGIST:  Oneita Hurt, M.D  DISCHARGE DIAGNOSES: 1. 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 2. Stage III-IV squamous cell cancer. 3. Severe protein malnutrition. 4. Acute renal failure. 5. Hypokalemia. 6. Diabetes type 2. 7. Hypertension. 8. Hyperlipidemia.  DISCHARGE MEDICATIONS: 1. Actos 30 mg two tabs daily. 2. Free water per tube 250 q.6 hours p.r.n. 3. Amaryl 4 mg daily. 4. Lidocaine Viscous 5 mL q.4 hours p.r.n. 5. Reglan 5 mg q.6 hours. 6. Nutritional supplement as directed. 7. Psyllium 1 packet daily. 8. Roxanol 2 to 4 mg sublingual q.2 hours p.r.n. 9. Sucralfate 1 g tablet q.6 hours. 10.Glucerna vanilla shakes 237 mL p.o. t.i.d. with meals. 11.Latanoprost 0.005% one drop at bedtime. 12.Lorazepam 1 tablet t.i.d. p.r.n. as needed. 13.Protonix 40 mg daily. 14.Prochlorperazine 10 mg for nausea q.6 hours p.r.n. 15.Testosterone application 1 to 2 mL intramuscularly every 2 weeks as     per primary care doctor.  PROCEDURE PERFORMED: 1. Portable chest x-ray showed COPD, decreasing size of left lower     lung mass. 2. Chest x-ray on October 01, 2010, Port-A-Cath catheter in good     position. No pneumothorax.  On September 30, 2010, showed worsening     aeration of the left lower lobe consolidation and atelectasis. 3. Endoscopy that showed radiation damage with biopsies that showed     ulcers with herpes simplex effect, no malignancy.  CONSULTATION:  GI, Petra Kuba, M.D.  Please refer to dictation  from November 24, 2010 and December 06, 2010, for further details.  BRIEF HOSPITAL COURSE: 1. 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     Because of the diagnosis being uncertain and recently discharge      from the hospital GI was consulted .  They did a biopsy with results      above.     So he was started on acyclovir, which he     completed treatment in the hospital.  A PEG tube was placed due to     malnutrition by Dr. Ewing Schlein.  He will follow up on this as an     outpatient. 3. Squamous cell lung cancer, followed by Dr. Arbutus Ped.  He has     remained stable.  He will follow up with Dr. Arbutus Ped as an     outpatient. 4. Severe protein malnutrition due to his severe malnutrition and     continued losing weight secondary to not eating despite     supplemented with Ensure and puddings.  Patient declined feeding     tube for nutritional purpose and ultimately TNA was started.  On     September 13, this was discussed with him about  a PEG tube     placement, he agreed, Dr. Ewing Schlein was called.  PEG tube was placed.     He was started on Jevity.  It caused him to be bloating.  This was     changed to Osmolite, which he tolerated well overnight and we have     been increasing his dose.  He will go home with [facility],     which will increase his nutrition to 85 mL an hour.  After this, we     will keep this for 24 hours and then change him to bolus.  So,     [facility] has been notified and they will follow up as an     outpatient with a pump. 5. Acute renal failure, probably secondary to decreased p.o. intake.     This has resolved with IV fluids. 6. Diabetes type 2.  His diabetes medications were held on admission     when he was started on his PEG tube feeding.  His glucose started     showing up.  He was corrected with sliding scale here in the hospital,     but then he was changed to p.o. He would take will take at home and     follow up as  an outpatient with his primary care doctor. 7. Hypertension.  His blood pressure currently has been well     controlled.  He is probably with amount of weight loss.  We will     continue to monitor this.  He will follow up as an outpatient.  He     will go home on no medications. 8. Hypokalemia, this resolved. 9. Hyperlipidemia, this is probably secondary to TNA feedings.  Vitals on the day of discharge show temperature 98, pulse 83, respirations 18, blood pressure 131/73, O2 sat 92% on room air.  Labs on day of discharge shows sodium 138, potassium 4.0, chloride 103, bicarb of 27, glucose of 175, BUN of 12, creatinine 1.0, and calcium of 9.6  DISPOSITION:  The patient will follow up with his primary care doctor as an outpatient here.  We will check his blood pressure and titrate his blood pressure medication as needed.  We will also follow up with Dr. Ewing Schlein in 2 weeks to see how he is doing and to see if we could start diet.  He will also follow up with Dr. Arbutus Ped as an outpatient.     Marinda Elk, M.D.     AF/MEDQ  D:  12/09/2010  T:  12/10/2010  Job:  [id number]  cc:   Windle Guard, M.D. Fax: [phone]  Lajuana Matte, M.D.  Oneita Hurt, M.D. Fax: [phone]  Petra Kuba, M.D. Fax: [phone]  Electronically Signed by Marinda Elk M.D. on 12/21/2010 08:26:18 AM

## 2010-12-28 ENCOUNTER — Other Ambulatory Visit: Payer: Self-pay | Admitting: Internal Medicine

## 2010-12-28 ENCOUNTER — Ambulatory Visit (HOSPITAL_COMMUNITY)
Admission: RE | Admit: 2010-12-28 | Discharge: 2010-12-28 | Disposition: A | Discharge status: Home / Self Care | Payer: Medicare Other | Source: Ambulatory Visit | Attending: Internal Medicine | Admitting: Internal Medicine

## 2010-12-28 ENCOUNTER — Encounter (HOSPITAL_BASED_OUTPATIENT_CLINIC_OR_DEPARTMENT_OTHER): Payer: Medicare Other | Admitting: Internal Medicine

## 2010-12-28 DIAGNOSIS — Z931 Gastrostomy status: Secondary | ICD-10-CM | POA: Insufficient documentation

## 2010-12-28 DIAGNOSIS — R222 Localized swelling, mass and lump, trunk: Secondary | ICD-10-CM | POA: Insufficient documentation

## 2010-12-28 DIAGNOSIS — Z9221 Personal history of antineoplastic chemotherapy: Secondary | ICD-10-CM | POA: Insufficient documentation

## 2010-12-28 DIAGNOSIS — K5900 Constipation, unspecified: Secondary | ICD-10-CM | POA: Insufficient documentation

## 2010-12-28 DIAGNOSIS — Z923 Personal history of irradiation: Secondary | ICD-10-CM | POA: Insufficient documentation

## 2010-12-28 DIAGNOSIS — C3430 Malignant neoplasm of lower lobe, unspecified bronchus or lung: Secondary | ICD-10-CM

## 2010-12-28 DIAGNOSIS — R0602 Shortness of breath: Secondary | ICD-10-CM | POA: Insufficient documentation

## 2010-12-28 DIAGNOSIS — K5730 Diverticulosis of large intestine without perforation or abscess without bleeding: Secondary | ICD-10-CM | POA: Insufficient documentation

## 2010-12-28 DIAGNOSIS — N281 Cyst of kidney, acquired: Secondary | ICD-10-CM | POA: Insufficient documentation

## 2010-12-28 DIAGNOSIS — R599 Enlarged lymph nodes, unspecified: Secondary | ICD-10-CM | POA: Insufficient documentation

## 2010-12-28 DIAGNOSIS — J438 Other emphysema: Secondary | ICD-10-CM | POA: Insufficient documentation

## 2010-12-28 DIAGNOSIS — C3490 Malignant neoplasm of unspecified part of unspecified bronchus or lung: Secondary | ICD-10-CM

## 2010-12-28 LAB — CMP (CANCER CENTER ONLY)
ALT(SGPT): 21 U/L (ref 10–47)
Albumin: 3.1 g/dL — ABNORMAL LOW (ref 3.3–5.5)
CO2: 28 mEq/L (ref 18–33)
Calcium: 9.5 mg/dL (ref 8.0–10.3)
Chloride: 98 mEq/L (ref 98–108)
Glucose, Bld: 125 mg/dL — ABNORMAL HIGH (ref 73–118)
Potassium: 4.6 mEq/L (ref 3.3–4.7)
Sodium: 139 mEq/L (ref 128–145)
Total Bilirubin: 0.9 mg/dl (ref 0.20–1.60)
Total Protein: 7.4 g/dL (ref 6.4–8.1)

## 2010-12-28 LAB — CBC WITH DIFFERENTIAL/PLATELET
BASO%: 0.7 % (ref 0.0–2.0)
Eosinophils Absolute: 0.5 10*3/uL (ref 0.0–0.5)
MCHC: 34.2 g/dL (ref 32.0–36.0)
MONO#: 0.6 10*3/uL (ref 0.1–0.9)
NEUT#: 4.2 10*3/uL (ref 1.5–6.5)
RBC: 3.96 10*6/uL — ABNORMAL LOW (ref 4.20–5.82)
WBC: 6.3 10*3/uL (ref 4.0–10.3)
lymph#: 0.9 10*3/uL (ref 0.9–3.3)

## 2010-12-28 MED ORDER — IOHEXOL 300 MG/ML  SOLN
100.0000 mL | Freq: Once | INTRAMUSCULAR | Status: AC | PRN
  Administered 2010-12-28: 100 mL via INTRAVENOUS

## 2010-12-31 ENCOUNTER — Other Ambulatory Visit: Payer: Self-pay | Admitting: Radiation Oncology

## 2010-12-31 DIAGNOSIS — C7931 Secondary malignant neoplasm of brain: Secondary | ICD-10-CM

## 2011-01-03 ENCOUNTER — Ambulatory Visit
Admission: RE | Admit: 2011-01-03 | Discharge: 2011-01-03 | Disposition: A | Discharge status: Home / Self Care | Payer: Medicare Other | Source: Ambulatory Visit | Attending: Radiation Oncology | Admitting: Radiation Oncology

## 2011-01-04 NOTE — Op Note (Signed)
[name], [name] NO.:  [id number]  MEDICAL RECORD NO.:  [id number]  LOCATION:                                 FACILITY:  PHYSICIAN:  Petra Kuba, M.D.    DATE OF BIRTH:  November 06, 1935  DATE OF PROCEDURE:  12/01/2010 DATE OF DISCHARGE:                              OPERATIVE REPORT   PROCEDURE:  Percutaneous endoscopic gastrostomy placement.  INDICATIONS:  The patient with probable radiation-induced esophagitis. Consent was signed after risks, benefits, methods, options thoroughly discussed multiple times during this hospital stay.  MEDICINES USED: 1. Fentanyl 55 mcg. 2. Versed 4 mg. 3. Benadryl 12.5 mg.  PROCEDURE IN DETAIL:  The video endoscope was inserted by direct vision. His esophageal radiation damage was confirmed between 25 and 30 cm.  The scope passed easily, passed this area and into the stomach and advanced through a normal antrum, normal pylorus, into a normal duodenal bulb around the C-loop to normal second portion of the duodenum.  Scope was withdrawn back to the stomach, which was evaluated on straight and retroflex visualization and no abnormalities were seen.  Scope was re- withdrawn through the esophagus back to the upper esophageal sphincter, which confirmed above findings.  Scope was then advanced to the distal greater curve and the light reflex was seen in the midepigastric area. One-to-one finger palpation was confirmed endoscopically.  While the abdomen was sterilely dressed and draped, the snare was advanced through the scope.  Lidocaine was used to raise the customary wheel.  A small incision was made using the blade.  The introducer needle was advanced on the first attempt into the stomach and confirmed in the proper position endoscopically.  The blue Ponsky pull cord was advanced through the introducer, grabbed with the snare and the scope, snare, and cord were withdrawn through the patient's mouth.  The [place] scientific  24- French pull PEG was attached to the cord and withdrawn through the abdominal incision in the customary fashion.  While the bumper was being placed, the scope was reinserted.  There was minimal trauma to the radiation area and the tension on the bumper was confirmed endoscopically by turning.  We did put the bumper at roughly the 2.5 cm mark.  The scope was then slowly withdrawn.  There was no signs of complication.  The patient tolerated the procedure well.  There was no obvious immediate complication.  ENDOSCOPIC DIAGNOSES: 1. Radiation damage from 25-30 cm of the esophagus. 2. Otherwise normal esophagogastroduodenoscopy therapy, 24-French     Ponsky-style pull [place] scientific percutaneous endoscopic     gastrostomy placed without obvious problem.  PLAN:  We will observe for 6 hours, slowly advance tube feeds. Probably, he would be best served with either continuous __________ and bolus during the day or just bolus feeding at home whenever __________ family and happy to see back p.r.n. and happy to remove PEG when eating well in at least a month and happy to see back sooner p.r.n.  We will check on tomorrow to make sure no delayed complications.          ______________________________ Petra Kuba, M.D.     MEM/MEDQ  D:  12/01/2010  T:  12/01/2010  Job:  [id number]  cc:   Lajuana Matte, M.D.  Electronically Signed by Vida Rigger M.D. on 01/04/2011 02:29:51 PM

## 2011-01-12 ENCOUNTER — Encounter (HOSPITAL_BASED_OUTPATIENT_CLINIC_OR_DEPARTMENT_OTHER): Payer: Medicare Other | Admitting: Internal Medicine

## 2011-01-12 ENCOUNTER — Other Ambulatory Visit: Payer: Self-pay | Admitting: Internal Medicine

## 2011-01-12 DIAGNOSIS — C3490 Malignant neoplasm of unspecified part of unspecified bronchus or lung: Secondary | ICD-10-CM

## 2011-01-12 DIAGNOSIS — C3430 Malignant neoplasm of lower lobe, unspecified bronchus or lung: Secondary | ICD-10-CM

## 2011-01-12 DIAGNOSIS — Z923 Personal history of irradiation: Secondary | ICD-10-CM

## 2011-01-21 ENCOUNTER — Other Ambulatory Visit: Payer: Self-pay

## 2011-04-08 ENCOUNTER — Ambulatory Visit (HOSPITAL_COMMUNITY)
Admission: RE | Admit: 2011-04-08 | Discharge: 2011-04-08 | Disposition: A | Discharge status: Home / Self Care | Payer: Medicare Other | Source: Ambulatory Visit | Attending: Internal Medicine | Admitting: Internal Medicine

## 2011-04-08 ENCOUNTER — Other Ambulatory Visit (HOSPITAL_BASED_OUTPATIENT_CLINIC_OR_DEPARTMENT_OTHER): Payer: Medicare Other | Admitting: Lab

## 2011-04-08 DIAGNOSIS — C3430 Malignant neoplasm of lower lobe, unspecified bronchus or lung: Secondary | ICD-10-CM

## 2011-04-08 DIAGNOSIS — Z9221 Personal history of antineoplastic chemotherapy: Secondary | ICD-10-CM | POA: Insufficient documentation

## 2011-04-08 DIAGNOSIS — J438 Other emphysema: Secondary | ICD-10-CM | POA: Insufficient documentation

## 2011-04-08 DIAGNOSIS — C3490 Malignant neoplasm of unspecified part of unspecified bronchus or lung: Secondary | ICD-10-CM | POA: Insufficient documentation

## 2011-04-08 DIAGNOSIS — J90 Pleural effusion, not elsewhere classified: Secondary | ICD-10-CM | POA: Insufficient documentation

## 2011-04-08 DIAGNOSIS — Z923 Personal history of irradiation: Secondary | ICD-10-CM | POA: Insufficient documentation

## 2011-04-08 LAB — CBC WITH DIFFERENTIAL/PLATELET
BASO%: 0.1 % (ref 0.0–2.0)
LYMPH%: 11.4 % — ABNORMAL LOW (ref 14.0–49.0)
MCHC: 33.7 g/dL (ref 32.0–36.0)
MONO#: 0.5 10*3/uL (ref 0.1–0.9)
Platelets: 235 10*3/uL (ref 140–400)
RBC: 5.29 10*6/uL (ref 4.20–5.82)
RDW: 16.1 % — ABNORMAL HIGH (ref 11.0–14.6)
WBC: 7.8 10*3/uL (ref 4.0–10.3)

## 2011-04-08 LAB — CMP (CANCER CENTER ONLY)
ALT(SGPT): 14 U/L (ref 10–47)
Alkaline Phosphatase: 63 U/L (ref 26–84)
CO2: 28 mEq/L (ref 18–33)
Potassium: 3.7 mEq/L (ref 3.3–4.7)
Sodium: 143 mEq/L (ref 128–145)
Total Bilirubin: 0.9 mg/dl (ref 0.20–1.60)
Total Protein: 7.8 g/dL (ref 6.4–8.1)

## 2011-04-08 MED ORDER — IOHEXOL 300 MG/ML  SOLN
80.0000 mL | Freq: Once | INTRAMUSCULAR | Status: AC | PRN
  Administered 2011-04-08: 80 mL via INTRAVENOUS

## 2011-04-13 ENCOUNTER — Telehealth: Payer: Self-pay | Admitting: Internal Medicine

## 2011-04-13 ENCOUNTER — Ambulatory Visit (HOSPITAL_BASED_OUTPATIENT_CLINIC_OR_DEPARTMENT_OTHER): Payer: Medicare Other | Admitting: Internal Medicine

## 2011-04-13 VITALS — BP 142/80 | HR 89 | Temp 97.6°F | Ht 69.5 in | Wt 215.5 lb

## 2011-04-13 DIAGNOSIS — C3490 Malignant neoplasm of unspecified part of unspecified bronchus or lung: Secondary | ICD-10-CM

## 2011-04-13 NOTE — Progress Notes (Signed)
[facility] OFFICE PROGRESS NOTE  [name], MD, MD [address] [place] [address]  PRINCIPAL DIAGNOSIS:  Stage IIIB/IV non-small cell lung cancer diagnosed in June 2012.  PRIOR THERAPY:  Status post a course of concurrent chemoradiation with weekly carboplatin and paclitaxel.  Last dose of chemotherapy was given on November 01, 2010.  CURRENT THERAPY:  None.  INTERVAL HISTORY: [name] 76 y.o. male returns to the clinic today for three-month followup visit accompanied his wife. The patient is feeling well today with no specific complaints except for occasional dry cough. He denied having any significant chest pain or shortness of breath, no hemoptysis. No significant weight loss or night sweats. He has repeat CT scan of the chest performed recently and he is here today for evaluation and discussion of his scan results.  MEDICAL HISTORY: Past Medical History  Diagnosis Date  . Cancer   . Diabetes mellitus     ALLERGIES:  is allergic to codeine and penicillins.  MEDICATIONS:  Current Outpatient Prescriptions  Medication Sig Dispense Refill  . glimepiride (AMARYL) 2 MG tablet Take 2 mg by mouth daily before breakfast.      . pioglitazone (ACTOS) 30 MG tablet Take 30 mg by mouth daily.        REVIEW OF SYSTEMS:  A comprehensive review of systems was negative except for: Respiratory: positive for cough   PHYSICAL EXAMINATION: General appearance: alert, cooperative and no distress Resp: clear to auscultation bilaterally Cardio: regular rate and rhythm, S1, S2 normal, no murmur, click, rub or gallop GI: soft, non-tender; bowel sounds normal; no masses,  no organomegaly Extremities: extremities normal, atraumatic, no cyanosis or edema  ECOG PERFORMANCE STATUS: 0 - Asymptomatic  Blood pressure 142/80, pulse 89, temperature 97.6 F (36.4 C), temperature source Oral, height 5' 9.5" (1.765 m), weight 215 lb 8 oz (97.75  kg).  LABORATORY DATA: Lab Results  Component Value Date   WBC 7.8 04/08/2011   HGB 16.3 04/08/2011   HCT 48.3 04/08/2011   MCV 91.3 04/08/2011   PLT 235 04/08/2011      Chemistry      Component Value Date/Time   NA 143 04/08/2011 0844   NA 138 12/09/2010 0531   K 3.7 04/08/2011 0844   K 4.0 12/09/2010 0531   CL 103 04/08/2011 0844   CL 103 12/09/2010 0531   CO2 28 04/08/2011 0844   CO2 27 12/09/2010 0531   BUN 14 04/08/2011 0844   BUN 12 12/09/2010 0531   CREATININE 1.3* 04/08/2011 0844   CREATININE 1.01 12/09/2010 0531      Component Value Date/Time   CALCIUM 9.1 04/08/2011 0844   CALCIUM 9.6 12/09/2010 0531   ALKPHOS 63 04/08/2011 0844   ALKPHOS 73 12/08/2010 0600   AST 15 04/08/2011 0844   AST 17 12/08/2010 0600   ALT 15 12/08/2010 0600   BILITOT 0.90 04/08/2011 0844   BILITOT 0.4 12/08/2010 0600       RADIOGRAPHIC STUDIES: Ct Chest W Contrast  04/08/2011  *RADIOLOGY REPORT*  Clinical Data: Lung cancer.  Chemotherapy and radiation therapy completed in September, 2012.  CT CHEST WITH CONTRAST  Technique:  Multidetector CT imaging of the chest was performed following the standard protocol during bolus administration of intravenous contrast.  Contrast: 80mL OMNIPAQUE IOHEXOL 300 MG/ML IV SOLN  Comparison: 12/28/2010  Findings: A subcarinal soft tissue density anterior to the esophagus has a short axis diameter 1.3 cm (formerly 1.7  cm by my measurement).  A new small left pleural effusion is present.  No adrenal masses observed.  Airspace opacity is present in the lingula and left lower lobe, with some degree of volume loss.  This surrounds the previous mass, and obscures any borders of the mass.  Accordingly, assessment for change in size of the mass cannot be considered reliable. Presumably this represents radiation therapy related findings (radiation pneumonitis).  No nodularity along the margins of the pleural fluid identified.  Medially in the right lower lobe, there is new airspace opacity.  This likewise could be from radiation pneumonitis, although underlying pneumonia cannot be readily excluded - correlate with any fever/leukocytosis.  Prominent centrilobular emphysema noted.  Thoracic spondylosis noted with anterolateral bridging osteophyte  IMPRESSION:  1.  Airspace opacity in the left lower lobe and lingula obscures the location of the prior mass.  This likely represents radiation pneumonitis.  Follow-up observation is likely warranted.  Comment on change in size of the original mass is not feasible due to the regional airspace opacities. 2.  New small left pleural effusion, without nodularity along the margins of the effusion. 3.  Small region of airspace opacity medially in the right lower lobe posterior to the hilum may reflect radiation pneumonitis or pneumonia. 4.  Reduced size of the subcarinal lymph node from 1.7 cm to 1.3 cm in short axis. 5.  Emphysema.  Original Report Authenticated By: Dellia Cloud, M.D.    ASSESSMENT: This is a very pleasant 76 years old white male with history of stage CB/4 non-small cell lung cancer status post concurrent chemoradiation with weekly carboplatin and paclitaxel completed in August of 2012. The patient is doing fine and he has no evidence for disease progression on his recent scan. I discussed the scan results with the patient and his wife.  PLAN: I would consider the patient observation for now with repeat CT scan of the chest with contrast in 3 months. The patient will continue also to have a Port-A-Cath flush every 6 weeks. He was advised to call me immediately if he has any concerning symptoms in the interval.   All questions were answered. The patient knows to call the clinic with any problems, questions or concerns. We can certainly see the patient much sooner if necessary.

## 2011-04-13 NOTE — Telephone Encounter (Signed)
gv pt appt schedule for march thru June including ct for 5/2.

## 2011-05-25 ENCOUNTER — Ambulatory Visit (HOSPITAL_BASED_OUTPATIENT_CLINIC_OR_DEPARTMENT_OTHER): Payer: Medicare Other

## 2011-05-25 VITALS — BP 142/75 | HR 85 | Temp 97.7°F

## 2011-05-25 DIAGNOSIS — C3490 Malignant neoplasm of unspecified part of unspecified bronchus or lung: Secondary | ICD-10-CM

## 2011-05-25 DIAGNOSIS — C3430 Malignant neoplasm of lower lobe, unspecified bronchus or lung: Secondary | ICD-10-CM

## 2011-05-25 DIAGNOSIS — Z452 Encounter for adjustment and management of vascular access device: Secondary | ICD-10-CM

## 2011-05-25 MED ORDER — SODIUM CHLORIDE 0.9 % IJ SOLN
10.0000 mL | INTRAMUSCULAR | Status: DC | PRN
  Administered 2011-05-25: 10 mL via INTRAVENOUS
  Filled 2011-05-25: qty 10

## 2011-05-25 MED ORDER — HEPARIN SOD (PORK) LOCK FLUSH 100 UNIT/ML IV SOLN
500.0000 [IU] | Freq: Once | INTRAVENOUS | Status: AC
  Administered 2011-05-25: 500 [IU] via INTRAVENOUS
  Filled 2011-05-25: qty 5

## 2011-07-04 ENCOUNTER — Other Ambulatory Visit: Payer: Self-pay | Admitting: Medical Oncology

## 2011-07-04 ENCOUNTER — Telehealth: Payer: Self-pay | Admitting: Medical Oncology

## 2011-07-04 NOTE — Telephone Encounter (Signed)
Pt says CT will use port on 5/2 for CT scan and flush it after so he wants to cancel port flush on 4/24. Onc schedule request sent.

## 2011-07-14 ENCOUNTER — Other Ambulatory Visit (HOSPITAL_BASED_OUTPATIENT_CLINIC_OR_DEPARTMENT_OTHER): Payer: Medicare Other

## 2011-07-14 ENCOUNTER — Ambulatory Visit: Payer: Medicare Other

## 2011-07-14 ENCOUNTER — Ambulatory Visit (HOSPITAL_COMMUNITY)
Admission: RE | Admit: 2011-07-14 | Discharge: 2011-07-14 | Disposition: A | Discharge status: Home / Self Care | Payer: Medicare Other | Source: Ambulatory Visit | Attending: Internal Medicine | Admitting: Internal Medicine

## 2011-07-14 DIAGNOSIS — R05 Cough: Secondary | ICD-10-CM | POA: Insufficient documentation

## 2011-07-14 DIAGNOSIS — J90 Pleural effusion, not elsewhere classified: Secondary | ICD-10-CM | POA: Insufficient documentation

## 2011-07-14 DIAGNOSIS — C3490 Malignant neoplasm of unspecified part of unspecified bronchus or lung: Secondary | ICD-10-CM | POA: Insufficient documentation

## 2011-07-14 DIAGNOSIS — R059 Cough, unspecified: Secondary | ICD-10-CM | POA: Insufficient documentation

## 2011-07-14 DIAGNOSIS — C3430 Malignant neoplasm of lower lobe, unspecified bronchus or lung: Secondary | ICD-10-CM

## 2011-07-14 DIAGNOSIS — R0602 Shortness of breath: Secondary | ICD-10-CM | POA: Insufficient documentation

## 2011-07-14 DIAGNOSIS — K861 Other chronic pancreatitis: Secondary | ICD-10-CM | POA: Insufficient documentation

## 2011-07-14 DIAGNOSIS — I2510 Atherosclerotic heart disease of native coronary artery without angina pectoris: Secondary | ICD-10-CM | POA: Insufficient documentation

## 2011-07-14 LAB — CBC WITH DIFFERENTIAL/PLATELET
BASO%: 0.2 % (ref 0.0–2.0)
LYMPH%: 11.6 % — ABNORMAL LOW (ref 14.0–49.0)
MCHC: 32.8 g/dL (ref 32.0–36.0)
MCV: 88.9 fL (ref 79.3–98.0)
MONO%: 7.4 % (ref 0.0–14.0)
Platelets: 189 10*3/uL (ref 140–400)
RBC: 5.69 10*6/uL (ref 4.20–5.82)
WBC: 8 10*3/uL (ref 4.0–10.3)
nRBC: 0 % (ref 0–0)

## 2011-07-14 LAB — CMP (CANCER CENTER ONLY)
ALT(SGPT): 13 U/L (ref 10–47)
AST: 16 U/L (ref 11–38)
Creat: 1.3 mg/dl — ABNORMAL HIGH (ref 0.6–1.2)
Total Bilirubin: 1.1 mg/dl (ref 0.20–1.60)

## 2011-07-14 MED ORDER — IOHEXOL 300 MG/ML  SOLN
100.0000 mL | Freq: Once | INTRAMUSCULAR | Status: AC | PRN
  Administered 2011-07-14: 100 mL via INTRAVENOUS

## 2011-07-18 ENCOUNTER — Telehealth: Payer: Self-pay | Admitting: Internal Medicine

## 2011-07-18 ENCOUNTER — Ambulatory Visit (HOSPITAL_BASED_OUTPATIENT_CLINIC_OR_DEPARTMENT_OTHER): Payer: Medicare Other | Admitting: Internal Medicine

## 2011-07-18 VITALS — BP 142/69 | HR 99 | Temp 96.7°F | Ht 69.5 in | Wt 217.5 lb

## 2011-07-18 DIAGNOSIS — C3490 Malignant neoplasm of unspecified part of unspecified bronchus or lung: Secondary | ICD-10-CM

## 2011-07-18 DIAGNOSIS — C3430 Malignant neoplasm of lower lobe, unspecified bronchus or lung: Secondary | ICD-10-CM

## 2011-07-18 NOTE — Progress Notes (Signed)
Spoke with pt and wife at [facility] today.  No questions or concerns at this time 

## 2011-07-18 NOTE — Telephone Encounter (Signed)
Pt s appts made and printed for pt aom

## 2011-07-18 NOTE — Progress Notes (Signed)
[facility] [phone]   Fax:([phone]  OFFICE PROGRESS NOTE  [name], MD, MD [address] [place] [address]  PRINCIPAL DIAGNOSIS: Stage IIIB/IV non-small cell lung cancer diagnosed in June 2012.   PRIOR THERAPY: Status post a course of concurrent chemoradiation with weekly carboplatin and paclitaxel. Last dose of chemotherapy was given on November 01, 2010.   CURRENT THERAPY: None.   INTERVAL HISTORY: [name] 76 y.o. male returns to the clinic today for three-month followup visit accompanied his wife. [name] has no complaints today. He denied having any significant chest pain or shortness of breath, no cough or hemoptysis. No significant weight loss or night sweats. He has repeat CT scan of the chest performed recently and he is here today for evaluation and discussion of his scan results.  MEDICAL HISTORY: Past Medical History  Diagnosis Date  . Cancer   . Diabetes mellitus     ALLERGIES:  is allergic to codeine and penicillins.  MEDICATIONS:  Current Outpatient Prescriptions  Medication Sig Dispense Refill  . testosterone cypionate (DEPOTESTOTERONE CYPIONATE) 200 MG/ML injection Inject 2 mg as directed Once every 2 weeks.        REVIEW OF SYSTEMS:  A comprehensive review of systems was negative.   PHYSICAL EXAMINATION: General appearance: alert, cooperative and no distress Neck: no adenopathy Lymph nodes: Cervical, supraclavicular, and axillary nodes normal. Resp: clear to auscultation bilaterally Cardio: regular rate and rhythm, S1, S2 normal, no murmur, click, rub or gallop GI: soft, non-tender; bowel sounds normal; no masses,  no organomegaly Extremities: extremities normal, atraumatic, no cyanosis or edema Neurologic: Alert and oriented X 3, normal strength and tone. Normal symmetric reflexes. Normal coordination and gait  ECOG PERFORMANCE STATUS: 0 - Asymptomatic  Blood pressure 142/69,  pulse 99, temperature 96.7 F (35.9 C), temperature source Oral, height 5' 9.5" (1.765 m), weight 217 lb 8 oz (98.657 kg).  LABORATORY DATA: Lab Results  Component Value Date   WBC 8.0 07/14/2011   HGB 16.6 07/14/2011   HCT 50.6* 07/14/2011   MCV 88.9 07/14/2011   PLT 189 07/14/2011      Chemistry      Component Value Date/Time   NA 139 07/14/2011 0807   NA 138 12/09/2010 0531   K 4.2 07/14/2011 0807   K 4.0 12/09/2010 0531   CL 95* 07/14/2011 0807   CL 103 12/09/2010 0531   CO2 29 07/14/2011 0807   CO2 27 12/09/2010 0531   BUN 15 07/14/2011 0807   BUN 12 12/09/2010 0531   CREATININE 1.3* 07/14/2011 0807   CREATININE 1.01 12/09/2010 0531      Component Value Date/Time   CALCIUM 8.7 07/14/2011 0807   CALCIUM 9.6 12/09/2010 0531   ALKPHOS 60 07/14/2011 0807   ALKPHOS 73 12/08/2010 0600   AST 16 07/14/2011 0807   AST 17 12/08/2010 0600   ALT 15 12/08/2010 0600   BILITOT 1.10 07/14/2011 0807   BILITOT 0.4 12/08/2010 0600       RADIOGRAPHIC STUDIES: Ct Chest W Contrast  07/14/2011  **ADDENDUM** CREATED: 07/14/2011 11:17:46  In the comparison section of report, the following correction is made:  Per Dr. Asa Lente progress note of 04/13/2011, concurrent chemoradiation was completed in August 2012.  **END ADDENDUM** SIGNED BY: Reyes Ivan, M.D.    07/14/2011  *RADIOLOGY REPORT*  Clinical Data: Lung cancer.  Shortness of breath and cough.  CT CHEST WITH CONTRAST  Technique:  Multidetector CT imaging  of the chest was performed following the standard protocol during bolus administration of intravenous contrast.  Contrast: OMNIPAQUE IOHEXOL 300 MG/ML  SOLN  Comparison: 04/08/2011 and 08/06/2010. Per Dr., it has progressed nodes of the 04/13/2011,chemo radiation completed in August 2012.  Findings: No pathologically enlarged mediastinal, hilar or axillary lymph nodes.  Atherosclerotic calcification of the arterial vasculature, including coronary arteries.  Heart size normal.  No pericardial effusion.  Small left  pleural effusion.  Centrilobular emphysema. Radiation fibrosis and volume loss are seen in the left perihilar region with extension into the left upper and left lower lobes.  Findings are unchanged.  Mild scarring in the medial right lower lobe.  A 3 mm nodule along the minor fissure is unchanged from 08/06/2010, favoring a subpleural lymph node.  Airway is unremarkable.  Incidental imaging of the upper abdomen shows calcifications in the head and uncinate process of the pancreas.  No worrisome lytic or sclerotic lesions.  IMPRESSION:  1.  Radiation changes in the left perihilar region with a small left pleural effusion, stable.  No evidence of recurrent or metastatic disease. 2.  Chronic calcific pancreatitis.  Original Report Authenticated By: Reyes Ivan, M.D.    ASSESSMENT: This is a very pleasant 76 years old white male with history of stage IIIB/4 non-small cell lung cancer status post concurrent chemoradiation with weekly carboplatin and paclitaxel. The patient is doing fine and he has no evidence for disease progression.  PLAN: I discussed the scan results with the patient and his wife. I recommended for him continuous observation for now with repeat CT scan of the chest in 3 months. He would come back for followup visit at that time. She was advised to call me immediately if he has any concerning symptoms in the interval  All questions were answered. The patient knows to call the clinic with any problems, questions or concerns. We can certainly see the patient much sooner if necessary.

## 2011-08-17 ENCOUNTER — Ambulatory Visit (HOSPITAL_BASED_OUTPATIENT_CLINIC_OR_DEPARTMENT_OTHER): Payer: Medicare Other

## 2011-08-17 VITALS — BP 144/69 | HR 86 | Temp 98.2°F

## 2011-08-17 DIAGNOSIS — C3430 Malignant neoplasm of lower lobe, unspecified bronchus or lung: Secondary | ICD-10-CM

## 2011-08-17 DIAGNOSIS — Z452 Encounter for adjustment and management of vascular access device: Secondary | ICD-10-CM

## 2011-08-17 DIAGNOSIS — C3490 Malignant neoplasm of unspecified part of unspecified bronchus or lung: Secondary | ICD-10-CM

## 2011-08-17 MED ORDER — SODIUM CHLORIDE 0.9 % IJ SOLN
10.0000 mL | INTRAMUSCULAR | Status: DC | PRN
  Administered 2011-08-17: 10 mL via INTRAVENOUS
  Filled 2011-08-17: qty 10

## 2011-08-17 MED ORDER — HEPARIN SOD (PORK) LOCK FLUSH 100 UNIT/ML IV SOLN
500.0000 [IU] | Freq: Once | INTRAVENOUS | Status: AC
  Administered 2011-08-17: 500 [IU] via INTRAVENOUS
  Filled 2011-08-17: qty 5

## 2011-09-30 ENCOUNTER — Telehealth: Payer: Self-pay | Admitting: Internal Medicine

## 2011-09-30 NOTE — Telephone Encounter (Signed)
s/w pts wife and moved his appt from 8/8 to 8/13   aom °

## 2011-10-18 ENCOUNTER — Other Ambulatory Visit (HOSPITAL_COMMUNITY): Payer: Self-pay

## 2011-10-19 ENCOUNTER — Ambulatory Visit (HOSPITAL_COMMUNITY)
Admission: RE | Admit: 2011-10-19 | Discharge: 2011-10-19 | Disposition: A | Discharge status: Home / Self Care | Payer: Medicare Other | Source: Ambulatory Visit | Attending: Internal Medicine | Admitting: Internal Medicine

## 2011-10-19 ENCOUNTER — Other Ambulatory Visit (HOSPITAL_BASED_OUTPATIENT_CLINIC_OR_DEPARTMENT_OTHER): Payer: Medicare Other | Admitting: Lab

## 2011-10-19 ENCOUNTER — Ambulatory Visit: Payer: Self-pay | Admitting: Internal Medicine

## 2011-10-19 DIAGNOSIS — C3490 Malignant neoplasm of unspecified part of unspecified bronchus or lung: Secondary | ICD-10-CM

## 2011-10-19 DIAGNOSIS — R0602 Shortness of breath: Secondary | ICD-10-CM | POA: Insufficient documentation

## 2011-10-19 DIAGNOSIS — C3430 Malignant neoplasm of lower lobe, unspecified bronchus or lung: Secondary | ICD-10-CM

## 2011-10-19 DIAGNOSIS — J984 Other disorders of lung: Secondary | ICD-10-CM | POA: Insufficient documentation

## 2011-10-19 LAB — CMP (CANCER CENTER ONLY)
ALT(SGPT): 26 U/L (ref 10–47)
AST: 19 U/L (ref 11–38)
Albumin: 3.3 g/dL (ref 3.3–5.5)
Alkaline Phosphatase: 48 U/L (ref 26–84)
BUN, Bld: 13 mg/dL (ref 7–22)
Calcium: 9.3 mg/dL (ref 8.0–10.3)
Chloride: 99 mEq/L (ref 98–108)
Potassium: 4.5 mEq/L (ref 3.3–4.7)
Sodium: 134 mEq/L (ref 128–145)

## 2011-10-19 LAB — CBC WITH DIFFERENTIAL/PLATELET
BASO%: 0.9 % (ref 0.0–2.0)
Basophils Absolute: 0.1 10*3/uL (ref 0.0–0.1)
EOS%: 4.8 % (ref 0.0–7.0)
MCH: 30.9 pg (ref 27.2–33.4)
MCHC: 34 g/dL (ref 32.0–36.0)
MCV: 90.9 fL (ref 79.3–98.0)
MONO%: 8 % (ref 0.0–14.0)
RBC: 5.65 10*6/uL (ref 4.20–5.82)
RDW: 14.9 % — ABNORMAL HIGH (ref 11.0–14.6)
lymph#: 1 10*3/uL (ref 0.9–3.3)

## 2011-10-19 MED ORDER — IOHEXOL 300 MG/ML  SOLN
80.0000 mL | Freq: Once | INTRAMUSCULAR | Status: AC | PRN
  Administered 2011-10-19: 80 mL via INTRAVENOUS

## 2011-10-20 ENCOUNTER — Ambulatory Visit: Payer: Self-pay | Admitting: Internal Medicine

## 2011-10-25 ENCOUNTER — Telehealth: Payer: Self-pay | Admitting: Internal Medicine

## 2011-10-25 ENCOUNTER — Ambulatory Visit (HOSPITAL_BASED_OUTPATIENT_CLINIC_OR_DEPARTMENT_OTHER): Payer: Medicare Other | Admitting: Internal Medicine

## 2011-10-25 VITALS — BP 123/71 | HR 94 | Temp 97.9°F | Resp 20 | Ht 69.5 in | Wt 213.7 lb

## 2011-10-25 DIAGNOSIS — J984 Other disorders of lung: Secondary | ICD-10-CM

## 2011-10-25 DIAGNOSIS — C3490 Malignant neoplasm of unspecified part of unspecified bronchus or lung: Secondary | ICD-10-CM | POA: Insufficient documentation

## 2011-10-25 DIAGNOSIS — C3430 Malignant neoplasm of lower lobe, unspecified bronchus or lung: Secondary | ICD-10-CM

## 2011-10-25 NOTE — Telephone Encounter (Signed)
appts made and printed for pt aom °

## 2011-10-25 NOTE — Progress Notes (Signed)
[name] [phone]   [phone]  OFFICE PROGRESS NOTE  [name], MD [address] [place] [address]  PRINCIPAL DIAGNOSIS: Stage IIIB/IV non-small cell lung cancer diagnosed in June 2012.   PRIOR THERAPY: Status post a course of concurrent chemoradiation with weekly carboplatin and paclitaxel. Last dose of chemotherapy was given on November 01, 2010.   CURRENT THERAPY: None.  INTERVAL HISTORY: [name] 76 y.o. male returns to the clinic today for routine three-month followup visit accompanied by his wife. The patient is doing fine today with no specific complaints. He denied having any significant weight loss or night sweats. He denied having any swallowing issues. He has no chest pain or shortness breath, cough or hemoptysis. The patient has repeat CT scan of the chest performed recently and he is here for evaluation and discussion of his scan results.  MEDICAL HISTORY: Past Medical History  Diagnosis Date  . Cancer   . Diabetes mellitus     ALLERGIES:  is allergic to codeine and penicillins.  MEDICATIONS:  Current Outpatient Prescriptions  Medication Sig Dispense Refill  . glimepiride (AMARYL) 4 MG tablet Take 4 mg by mouth daily before breakfast.      . lovastatin (MEVACOR) 20 MG tablet Take 20 mg by mouth at bedtime.      Marland Kitchen testosterone cypionate (DEPOTESTOTERONE CYPIONATE) 200 MG/ML injection Inject 2 mg as directed Once every 2 weeks.      Marland Kitchen UNKNOWN TO PATIENT Pt takes BP med, cannot remember name      . UNKNOWN TO PATIENT daily. Eye gtts for glaucoma        REVIEW OF SYSTEMS:  A comprehensive review of systems was negative.   PHYSICAL EXAMINATION: General appearance: alert, cooperative and no distress Head: Normocephalic, without obvious abnormality, atraumatic Neck: no adenopathy Lymph nodes: Cervical, supraclavicular, and axillary nodes normal. Resp: clear to auscultation bilaterally Cardio:  regular rate and rhythm, S1, S2 normal, no murmur, click, rub or gallop GI: soft, non-tender; bowel sounds normal; no masses,  no organomegaly Extremities: extremities normal, atraumatic, no cyanosis or edema Neurologic: Alert and oriented X 3, normal strength and tone. Normal symmetric reflexes. Normal coordination and gait  ECOG PERFORMANCE STATUS: 0 - Asymptomatic  Blood pressure 123/71, pulse 94, temperature 97.9 F (36.6 C), temperature source Oral, resp. rate 20, height 5' 9.5" (1.765 m), weight 213 lb 11.2 oz (96.934 kg).  LABORATORY DATA: Lab Results  Component Value Date   WBC 8.3 10/19/2011   HGB 17.4* 10/19/2011   HCT 51.3* 10/19/2011   MCV 90.9 10/19/2011   PLT 198 10/19/2011      Chemistry      Component Value Date/Time   NA 134 10/19/2011 0817   NA 138 12/09/2010 0531   K 4.5 10/19/2011 0817   K 4.0 12/09/2010 0531   CL 99 10/19/2011 0817   CL 103 12/09/2010 0531   CO2 27 10/19/2011 0817   CO2 27 12/09/2010 0531   BUN 13 10/19/2011 0817   BUN 12 12/09/2010 0531   CREATININE 1.4* 10/19/2011 0817   CREATININE 1.01 12/09/2010 0531      Component Value Date/Time   CALCIUM 9.3 10/19/2011 0817   CALCIUM 9.6 12/09/2010 0531   ALKPHOS 48 10/19/2011 0817   ALKPHOS 73 12/08/2010 0600   AST 19 10/19/2011 0817   AST 17 12/08/2010 0600   ALT 15 12/08/2010 0600   BILITOT 0.90 10/19/2011 0817   BILITOT 0.4 12/08/2010  0600       RADIOGRAPHIC STUDIES: Ct Chest W Contrast  10/19/2011  *RADIOLOGY REPORT*  Clinical Data: Lung cancer with shortness of breath.  CT CHEST WITH CONTRAST  Technique:  Multidetector CT imaging of the chest was performed following the standard protocol during bolus administration of intravenous contrast.  Contrast: 80mL OMNIPAQUE IOHEXOL 300 MG/ML  SOLN  Comparison: 07/14/2011  Findings: The right-sided Port-A-Cath tip is in the distal SVC.  No axillary, mediastinal, or hilar lymphadenopathy.  The 11 mm short-axis subcarinal lymph node is stable.  The heart size is normal. Coronary  artery calcification is noted.  No pericardial effusion.  Lung windows again demonstrate central lobular emphysema. Irregular pleural thickening in the lower left hemithorax is stable and there is some persistent Volume loss and airspace opacification in the left perihilar region, presumably related to post treatment/radiation changes.  A new 3.2 cm round low density lesion is identified in the left lower lobe.  This area was essentially obscured by the postradiation change on the previous study. The new finding is concerning for a recurrent lesion with central necrosis.  Bone windows reveal no worrisome lytic or sclerotic osseous lesions.  IMPRESSION: 3.2 cm necrotic lesion in the central left lower lobe has imaging features consistent with recurrent neoplasm.  Stable borderline enlarged subcarinal lymph node.  Original Report Authenticated By: ERIC A. MANSELL, M.D.    ASSESSMENT: This is a very pleasant 76 years old white male with history of stage IIIB non-small cell lung cancer status post concurrent chemoradiation completed in August of 2012. The patient has a questionable necrotic lesion in the central left lower lobe suspicious for disease recurrence.  PLAN: I discussed the scan results with the patient and his wife and showed them the images. I recommended for him to have a PET scan performed for further evaluation of this lesion. I would see him back for followup visit in 2 weeks for evaluation and discussion of his PET scan results and recommendation regarding further treatment of his disease.  All questions were answered. The patient knows to call the clinic with any problems, questions or concerns. We can certainly see the patient much sooner if necessary.

## 2011-10-31 ENCOUNTER — Encounter (HOSPITAL_COMMUNITY)
Admission: RE | Admit: 2011-10-31 | Discharge: 2011-10-31 | Disposition: A | Discharge status: Home / Self Care | Payer: Medicare Other | Source: Ambulatory Visit | Attending: Internal Medicine | Admitting: Internal Medicine

## 2011-10-31 DIAGNOSIS — C3490 Malignant neoplasm of unspecified part of unspecified bronchus or lung: Secondary | ICD-10-CM | POA: Insufficient documentation

## 2011-10-31 MED ORDER — FLUDEOXYGLUCOSE F - 18 (FDG) INJECTION
18.7000 | Freq: Once | INTRAVENOUS | Status: AC | PRN
  Administered 2011-10-31: 18.7 via INTRAVENOUS

## 2011-11-07 ENCOUNTER — Telehealth: Payer: Self-pay | Admitting: Internal Medicine

## 2011-11-07 ENCOUNTER — Ambulatory Visit (HOSPITAL_BASED_OUTPATIENT_CLINIC_OR_DEPARTMENT_OTHER): Payer: Self-pay | Admitting: Internal Medicine

## 2011-11-07 VITALS — BP 135/57 | HR 98 | Temp 97.3°F | Resp 20 | Ht 69.5 in | Wt 212.9 lb

## 2011-11-07 DIAGNOSIS — C3490 Malignant neoplasm of unspecified part of unspecified bronchus or lung: Secondary | ICD-10-CM

## 2011-11-07 DIAGNOSIS — E119 Type 2 diabetes mellitus without complications: Secondary | ICD-10-CM

## 2011-11-07 DIAGNOSIS — C3430 Malignant neoplasm of lower lobe, unspecified bronchus or lung: Secondary | ICD-10-CM

## 2011-11-07 NOTE — Patient Instructions (Signed)
Chemotherapy to restart with Carboplatin and Gemzar.  Chemotherapy will be once for 2 weeks then the third week off  Weekly labs day of chemotherapy

## 2011-11-07 NOTE — Telephone Encounter (Signed)
appts made and printed for pt  °

## 2011-11-07 NOTE — Progress Notes (Signed)
[facility] [phone]   [phone]  OFFICE PROGRESS NOTE  [name], MD [address] [place] [address]  PRINCIPAL DIAGNOSIS: Stage IIIB/IV non-small cell lung cancer diagnosed in June 2012.   PRIOR THERAPY: Status post a course of concurrent chemoradiation with weekly carboplatin and paclitaxel. Last dose of chemotherapy was given on November 01, 2010.   CURRENT THERAPY: None.  INTERVAL HISTORY: [name] 76 y.o. male returns to the clinic today for  followup visit accompanied by his wife. The patient is feeling fine today with no specific complaints. He denied having any significant weight loss or night sweats, no cough or hemoptysis. He denied having any significant weight loss or night sweats. He was found on the previous CT scan of the chest to have an enlarging mass in the left lung. I ordered a PET scan and the patient is here today for evaluation and discussion of his scan results.   MEDICAL HISTORY: Past Medical History  Diagnosis Date  . Cancer   . Diabetes mellitus     ALLERGIES:  is allergic to codeine and penicillins.  MEDICATIONS:  Current Outpatient Prescriptions  Medication Sig Dispense Refill  . enalapril (VASOTEC) 5 MG tablet Take 5 mg by mouth daily.      Marland Kitchen glimepiride (AMARYL) 4 MG tablet Take 4 mg by mouth daily before breakfast. 2 tabs daily with breakfast      . latanoprost (XALATAN) 0.005 % ophthalmic solution 1 drop at bedtime.      . lovastatin (MEVACOR) 20 MG tablet Take 20 mg by mouth at bedtime.      . metFORMIN (GLUCOPHAGE) 1000 MG tablet Take 1,000 mg by mouth 2 (two) times daily with a meal. 1/2 tab BID      . testosterone cypionate (DEPOTESTOTERONE CYPIONATE) 200 MG/ML injection Inject 2 mg as directed Once every 2 weeks.        REVIEW OF SYSTEMS:  A comprehensive review of systems was negative.   PHYSICAL EXAMINATION: General appearance: alert, cooperative and no  distress Head: Normocephalic, without obvious abnormality, atraumatic Neck: no adenopathy Lymph nodes: Cervical, supraclavicular, and axillary nodes normal. Resp: clear to auscultation bilaterally Cardio: regular rate and rhythm, S1, S2 normal, no murmur, click, rub or gallop GI: soft, non-tender; bowel sounds normal; no masses,  no organomegaly Extremities: extremities normal, atraumatic, no cyanosis or edema Neurologic: Alert and oriented X 3, normal strength and tone. Normal symmetric reflexes. Normal coordination and gait  ECOG PERFORMANCE STATUS: 1 - Symptomatic but completely ambulatory  Blood pressure 135/57, pulse 98, temperature 97.3 F (36.3 C), temperature source Oral, resp. rate 20, height 5' 9.5" (1.765 m), weight 212 lb 14.4 oz (96.571 kg).  LABORATORY DATA: Lab Results  Component Value Date   WBC 8.3 10/19/2011   HGB 17.4* 10/19/2011   HCT 51.3* 10/19/2011   MCV 90.9 10/19/2011   PLT 198 10/19/2011      Chemistry      Component Value Date/Time   NA 134 10/19/2011 0817   NA 138 12/09/2010 0531   K 4.5 10/19/2011 0817   K 4.0 12/09/2010 0531   CL 99 10/19/2011 0817   CL 103 12/09/2010 0531   CO2 27 10/19/2011 0817   CO2 27 12/09/2010 0531   BUN 13 10/19/2011 0817   BUN 12 12/09/2010 0531   CREATININE 1.4* 10/19/2011 0817   CREATININE 1.01 12/09/2010 0531      Component Value Date/Time   CALCIUM  9.3 10/19/2011 0817   CALCIUM 9.6 12/09/2010 0531   ALKPHOS 48 10/19/2011 0817   ALKPHOS 73 12/08/2010 0600   AST 19 10/19/2011 0817   AST 17 12/08/2010 0600   ALT 15 12/08/2010 0600   BILITOT 0.90 10/19/2011 0817   BILITOT 0.4 12/08/2010 0600       RADIOGRAPHIC STUDIES: Ct Chest W Contrast  10/19/2011  *RADIOLOGY REPORT*  Clinical Data: Lung cancer with shortness of breath.  CT CHEST WITH CONTRAST  Technique:  Multidetector CT imaging of the chest was performed following the standard protocol during bolus administration of intravenous contrast.  Contrast: 80mL OMNIPAQUE IOHEXOL 300 MG/ML  SOLN   Comparison: 07/14/2011  Findings: The right-sided Port-A-Cath tip is in the distal SVC.  No axillary, mediastinal, or hilar lymphadenopathy.  The 11 mm short-axis subcarinal lymph node is stable.  The heart size is normal. Coronary artery calcification is noted.  No pericardial effusion.  Lung windows again demonstrate central lobular emphysema. Irregular pleural thickening in the lower left hemithorax is stable and there is some persistent Volume loss and airspace opacification in the left perihilar region, presumably related to post treatment/radiation changes.  A new 3.2 cm round low density lesion is identified in the left lower lobe.  This area was essentially obscured by the postradiation change on the previous study. The new finding is concerning for a recurrent lesion with central necrosis.  Bone windows reveal no worrisome lytic or sclerotic osseous lesions.  IMPRESSION: 3.2 cm necrotic lesion in the central left lower lobe has imaging features consistent with recurrent neoplasm.  Stable borderline enlarged subcarinal lymph node.  Original Report Authenticated By: ERIC A. MANSELL, M.D.   Nm Pet Image Restag (ps) Skull Base To Thigh  10/31/2011  *RADIOLOGY REPORT*  Clinical Data: Subsequent treatment strategy for lung cancer. New left lung mass.  NUCLEAR MEDICINE PET SKULL BASE TO THIGH  Fasting Blood Glucose:  02/30/2018  Technique:  18.7 mCi F-18 FDG was injected intravenously. CT data was obtained and used for attenuation correction and anatomic localization only.  (This was not acquired as a diagnostic CT examination.) Additional exam technical data entered on technologist worksheet.  Comparison:  PET CT dated 08/23/2010.  CT chest dated 10/19/2011.  Findings:  Neck: No hypermetabolic lymph nodes in the neck.  Chest:  3.1 x 2.9 cm left lower lobe mass, corresponding to the necrotic lesion on prior CT chest, max SUV 6.6.  Radiation changes in the left paramediastinal region/lower lobe. Trace left  pleural effusion/pleural thickening.  Underlying severe emphysematous changes.  No hypermetabolic mediastinal or hilar nodes.  Focal metabolism adjacent to the patient's right chest port within the SVC (PET image 92), max SUV 10.7, without convincing CT correlate to suggest recurrent tumor.  Abdomen/Pelvis:  No abnormal hypermetabolic activity within the liver, pancreas, adrenal glands, or spleen.  Pancreatic calcifications, likely sequela of prior/chronic pancreatitis.  No hypermetabolic lymph nodes in the abdomen or pelvis.  Extensive colonic diverticulosis.  Normal appendix.  Skeleton:  Degenerative changes.  No focal hypermetabolic activity to suggest skeletal metastasis.  IMPRESSION: 3.1 cm left lower lobe mass, max SUV 6.6, compatible with recurrent tumor.   Original Report Authenticated By: Charline Bills, M.D. ( 10/31/2011 09:47:14 )     ASSESSMENT: This is a very pleasant 76 years old white male with history of stage IIIB/4 non-small cell lung cancer status post concurrent chemoradiation completed almost a year ago. The patient has some evidence for disease progression in the left lower lobe mass which  is hypermetabolic on the PET scan.  PLAN: I discussed the scan results and showed the images to the patient and his wife. I recommended for him systemic chemotherapy with carboplatin for AUC of 5 on day 1 and gemcitabine 1000 mg/M2 on days 1 and 8 every 3 weeks because of his disease recurrence. I discussed with the patient adverse effect of this treatment including but not limited to alopecia, myelosuppression, nausea vomiting, peripheral neuropathy, liver or renal dysfunction. The patient agreed to proceed with treatment as planned. He was started for a cycle of his treatment next week and he would come back for followup visit in 4 weeks with the start of cycle #2. He was advised to call me immediately if he has any concerning symptoms in the interval.  All questions were answered. The patient  knows to call the clinic with any problems, questions or concerns. We can certainly see the patient much sooner if necessary.  I spent 15 minutes counseling the patient face to face. The total time spent in the appointment was 25 minutes.

## 2011-11-17 ENCOUNTER — Ambulatory Visit (HOSPITAL_BASED_OUTPATIENT_CLINIC_OR_DEPARTMENT_OTHER): Payer: Medicare Other

## 2011-11-17 ENCOUNTER — Other Ambulatory Visit (HOSPITAL_BASED_OUTPATIENT_CLINIC_OR_DEPARTMENT_OTHER): Payer: Self-pay | Admitting: Lab

## 2011-11-17 VITALS — BP 146/68 | HR 75 | Temp 97.6°F | Resp 20

## 2011-11-17 DIAGNOSIS — C3490 Malignant neoplasm of unspecified part of unspecified bronchus or lung: Secondary | ICD-10-CM

## 2011-11-17 DIAGNOSIS — C3430 Malignant neoplasm of lower lobe, unspecified bronchus or lung: Secondary | ICD-10-CM

## 2011-11-17 DIAGNOSIS — Z5111 Encounter for antineoplastic chemotherapy: Secondary | ICD-10-CM

## 2011-11-17 LAB — CBC WITH DIFFERENTIAL/PLATELET
Basophils Absolute: 0.1 10*3/uL (ref 0.0–0.1)
EOS%: 5.2 % (ref 0.0–7.0)
HCT: 49.1 % (ref 38.4–49.9)
HGB: 17.1 g/dL (ref 13.0–17.1)
MCH: 30.4 pg (ref 27.2–33.4)
MCV: 87.2 fL (ref 79.3–98.0)
MONO%: 7.1 % (ref 0.0–14.0)
NEUT%: 71.6 % (ref 39.0–75.0)
Platelets: 155 10*3/uL (ref 140–400)
lymph#: 1.2 10*3/uL (ref 0.9–3.3)

## 2011-11-17 LAB — COMPREHENSIVE METABOLIC PANEL (CC13)
AST: 13 U/L (ref 5–34)
BUN: 18 mg/dL (ref 7.0–26.0)
Calcium: 9.2 mg/dL (ref 8.4–10.4)
Chloride: 103 mEq/L (ref 98–107)
Creatinine: 1.4 mg/dL — ABNORMAL HIGH (ref 0.7–1.3)

## 2011-11-17 MED ORDER — HEPARIN SOD (PORK) LOCK FLUSH 100 UNIT/ML IV SOLN
500.0000 [IU] | Freq: Once | INTRAVENOUS | Status: AC | PRN
  Administered 2011-11-17: 500 [IU]
  Filled 2011-11-17: qty 5

## 2011-11-17 MED ORDER — HEPARIN SOD (PORK) LOCK FLUSH 100 UNIT/ML IV SOLN
250.0000 [IU] | Freq: Once | INTRAVENOUS | Status: DC | PRN
  Filled 2011-11-17: qty 5

## 2011-11-17 MED ORDER — DEXAMETHASONE SODIUM PHOSPHATE 4 MG/ML IJ SOLN
20.0000 mg | Freq: Once | INTRAMUSCULAR | Status: AC
  Administered 2011-11-17: 20 mg via INTRAVENOUS

## 2011-11-17 MED ORDER — SODIUM CHLORIDE 0.9 % IV SOLN
Freq: Once | INTRAVENOUS | Status: AC
  Administered 2011-11-17: 09:00:00 via INTRAVENOUS

## 2011-11-17 MED ORDER — SODIUM CHLORIDE 0.9 % IV SOLN
441.5000 mg | Freq: Once | INTRAVENOUS | Status: AC
  Administered 2011-11-17: 440 mg via INTRAVENOUS
  Filled 2011-11-17: qty 44

## 2011-11-17 MED ORDER — SODIUM CHLORIDE 0.9 % IV SOLN
1000.0000 mg/m2 | Freq: Once | INTRAVENOUS | Status: AC
  Administered 2011-11-17: 2166 mg via INTRAVENOUS
  Filled 2011-11-17: qty 57

## 2011-11-17 MED ORDER — ONDANSETRON 16 MG/50ML IVPB (CHCC)
16.0000 mg | Freq: Once | INTRAVENOUS | Status: AC
  Administered 2011-11-17: 16 mg via INTRAVENOUS

## 2011-11-17 MED ORDER — SODIUM CHLORIDE 0.9 % IJ SOLN
10.0000 mL | INTRAMUSCULAR | Status: DC | PRN
  Administered 2011-11-17: 10 mL
  Filled 2011-11-17: qty 10

## 2011-11-17 NOTE — Patient Instructions (Signed)
[facility] Discharge Instructions for Patients Receiving Chemotherapy  Today you received the following chemotherapy agents Gemzar and Carboplatin.  To help prevent nausea and vomiting after your treatment, we encourage you to take your nausea medication. Begin taking your nausea medication as often as prescribed for by Dr. Arbutus Ped.    If you develop nausea and vomiting that is not controlled by your nausea medication, call the clinic. If it is after clinic hours your family physician or the after hours number for the clinic or go to the Emergency Department.   BELOW ARE SYMPTOMS THAT SHOULD BE REPORTED IMMEDIATELY:  *FEVER GREATER THAN 100.5 F  *CHILLS WITH OR WITHOUT FEVER  NAUSEA AND VOMITING THAT IS NOT CONTROLLED WITH YOUR NAUSEA MEDICATION  *UNUSUAL SHORTNESS OF BREATH  *UNUSUAL BRUISING OR BLEEDING  TENDERNESS IN MOUTH AND THROAT WITH OR WITHOUT PRESENCE OF ULCERS  *URINARY PROBLEMS  *BOWEL PROBLEMS  UNUSUAL RASH Items with * indicate a potential emergency and should be followed up as soon as possible.  One of the nurses will contact you 24 hours after your treatment. Please let the nurse know about any problems that you may have experienced. Feel free to call the clinic you have any questions or concerns. The clinic phone number is [phone].   I have been informed and understand all the instructions given to me. I know to contact the clinic, my physician, or go to the Emergency Department if any problems should occur. I do not have any questions at this time, but understand that I may call the clinic during office hours   should I have any questions or need assistance in obtaining follow up care.    __________________________________________  _____________  __________ Signature of Patient or Authorized Representative            Date                   Time    __________________________________________ Nurse's Signature    CARBOPLATIN (KAR  boe pla tin) is a chemotherapy drug. It targets fast dividing cells, like cancer cells, and causes these cells to die. This medicine is used to treat ovarian cancer and many other cancers. This medicine may be used for other purposes; ask your health care provider or pharmacist if you have questions. What should I tell my health care provider before I take this medicine? They need to know if you have any of these conditions: -blood disorders -hearing problems -kidney disease -recent or ongoing radiation therapy -an unusual or allergic reaction to carboplatin, cisplatin, other chemotherapy, other medicines, foods, dyes, or preservatives -pregnant or trying to get pregnant -breast-feeding How should I use this medicine? This drug is usually given as an infusion into a vein. It is administered in a hospital or clinic by a specially trained health care professional. Talk to your pediatrician regarding the use of this medicine in children. Special care may be needed. Overdosage: If you think you have taken too much of this medicine contact a poison control center or emergency room at once. NOTE: This medicine is only for you. Do not share this medicine with others. What if I miss a dose? It is important not to miss a dose. Call your doctor or health care professional if you are unable to keep an appointment. What may interact with this medicine? -medicines for seizures -medicines to increase blood counts like filgrastim, pegfilgrastim, sargramostim -some antibiotics like amikacin, gentamicin, neomycin, streptomycin, tobramycin -vaccines Talk to your  doctor or health care professional before taking any of these medicines: -acetaminophen -aspirin -ibuprofen -ketoprofen -naproxen This list may not describe all possible interactions. Give your health care provider a list of all the medicines, herbs, non-prescription drugs, or dietary supplements you use. Also tell them if you smoke, drink  alcohol, or use illegal drugs. Some items may interact with your medicine. What should I watch for while using this medicine? Your condition will be monitored carefully while you are receiving this medicine. You will need important blood work done while you are taking this medicine. This drug may make you feel generally unwell. This is not uncommon, as chemotherapy can affect healthy cells as well as cancer cells. Report any side effects. Continue your course of treatment even though you feel ill unless your doctor tells you to stop. In some cases, you may be given additional medicines to help with side effects. Follow all directions for their use. Call your doctor or health care professional for advice if you get a fever, chills or sore throat, or other symptoms of a cold or flu. Do not treat yourself. This drug decreases your body's ability to fight infections. Try to avoid being around people who are sick. This medicine may increase your risk to bruise or bleed. Call your doctor or health care professional if you notice any unusual bleeding. Be careful brushing and flossing your teeth or using a toothpick because you may get an infection or bleed more easily. If you have any dental work done, tell your dentist you are receiving this medicine. Avoid taking products that contain aspirin, acetaminophen, ibuprofen, naproxen, or ketoprofen unless instructed by your doctor. These medicines may hide a fever. Do not become pregnant while taking this medicine. Women should inform their doctor if they wish to become pregnant or think they might be pregnant. There is a potential for serious side effects to an unborn child. Talk to your health care professional or pharmacist for more information. Do not breast-feed an infant while taking this medicine. What side effects may I notice from receiving this medicine? Side effects that you should report to your doctor or health care professional as soon as  possible: -allergic reactions like skin rash, itching or hives, swelling of the face, lips, or tongue -signs of infection - fever or chills, cough, sore throat, pain or difficulty passing urine -signs of decreased platelets or bleeding - bruising, pinpoint red spots on the skin, black, tarry stools, nosebleeds -signs of decreased red blood cells - unusually weak or tired, fainting spells, lightheadedness -breathing problems -changes in hearing -changes in vision -chest pain -high blood pressure -low blood counts - This drug may decrease the number of white blood cells, red blood cells and platelets. You may be at increased risk for infections and bleeding. -nausea and vomiting -pain, swelling, redness or irritation at the injection site -pain, tingling, numbness in the hands or feet -problems with balance, talking, walking -trouble passing urine or change in the amount of urine Side effects that usually do not require medical attention (report to your doctor or health care professional if they continue or are bothersome): -hair loss -loss of appetite -metallic taste in the mouth or changes in taste This list may not describe all possible side effects. Call your doctor for medical advice about side effects. You may report side effects to FDA at 1-800-FDA-1088. Where should I keep my medicine? This drug is given in a hospital or clinic and will not be stored at home.  NOTE: This sheet is a summary. It may not cover all possible information. If you have questions about this medicine, talk to your doctor, pharmacist, or health care provider.  2012, Elsevier/Gold Standard. (06/05/2007 2:38:05 PM)    GEMCITABINE (jem SIT a been) is a chemotherapy drug. This medicine is used to treat many types of cancer like breast cancer, lung cancer, pancreatic cancer, and ovarian cancer. This medicine may be used for other purposes; ask your health care provider or pharmacist if you have questions. What  should I tell my health care provider before I take this medicine? They need to know if you have any of these conditions: -blood disorders -infection -kidney disease -liver disease -recent or ongoing radiation therapy -an unusual or allergic reaction to gemcitabine, other chemotherapy, other medicines, foods, dyes, or preservatives -pregnant or trying to get pregnant -breast-feeding How should I use this medicine? This drug is given as an infusion into a vein. It is administered in a hospital or clinic by a specially trained health care professional. Talk to your pediatrician regarding the use of this medicine in children. Special care may be needed. Overdosage: If you think you have taken too much of this medicine contact a poison control center or emergency room at once. NOTE: This medicine is only for you. Do not share this medicine with others. What if I miss a dose? It is important not to miss your dose. Call your doctor or health care professional if you are unable to keep an appointment. What may interact with this medicine? -medicines to increase blood counts like filgrastim, pegfilgrastim, sargramostim -some other chemotherapy drugs like cisplatin -vaccines Talk to your doctor or health care professional before taking any of these medicines: -acetaminophen -aspirin -ibuprofen -ketoprofen -naproxen This list may not describe all possible interactions. Give your health care provider a list of all the medicines, herbs, non-prescription drugs, or dietary supplements you use. Also tell them if you smoke, drink alcohol, or use illegal drugs. Some items may interact with your medicine. What should I watch for while using this medicine? Visit your doctor for checks on your progress. This drug may make you feel generally unwell. This is not uncommon, as chemotherapy can affect healthy cells as well as cancer cells. Report any side effects. Continue your course of treatment even though  you feel ill unless your doctor tells you to stop. In some cases, you may be given additional medicines to help with side effects. Follow all directions for their use. Call your doctor or health care professional for advice if you get a fever, chills or sore throat, or other symptoms of a cold or flu. Do not treat yourself. This drug decreases your body's ability to fight infections. Try to avoid being around people who are sick. This medicine may increase your risk to bruise or bleed. Call your doctor or health care professional if you notice any unusual bleeding. Be careful brushing and flossing your teeth or using a toothpick because you may get an infection or bleed more easily. If you have any dental work done, tell your dentist you are receiving this medicine. Avoid taking products that contain aspirin, acetaminophen, ibuprofen, naproxen, or ketoprofen unless instructed by your doctor. These medicines may hide a fever. Women should inform their doctor if they wish to become pregnant or think they might be pregnant. There is a potential for serious side effects to an unborn child. Talk to your health care professional or pharmacist for more information. Do not breast-feed  an infant while taking this medicine. What side effects may I notice from receiving this medicine? Side effects that you should report to your doctor or health care professional as soon as possible: -allergic reactions like skin rash, itching or hives, swelling of the face, lips, or tongue -low blood counts - this medicine may decrease the number of white blood cells, red blood cells and platelets. You may be at increased risk for infections and bleeding. -signs of infection - fever or chills, cough, sore throat, pain or difficulty passing urine -signs of decreased platelets or bleeding - bruising, pinpoint red spots on the skin, black, tarry stools, blood in the urine -signs of decreased red blood cells - unusually weak or tired,  fainting spells, lightheadedness -breathing problems -chest pain -mouth sores -nausea and vomiting -pain, swelling, redness at site where injected -pain, tingling, numbness in the hands or feet -stomach pain -swelling of ankles, feet, hands -unusual bleeding Side effects that usually do not require medical attention (report to your doctor or health care professional if they continue or are bothersome): -constipation -diarrhea -hair loss -loss of appetite -stomach upset This list may not describe all possible side effects. Call your doctor for medical advice about side effects. You may report side effects to FDA at 1-800-FDA-1088. Where should I keep my medicine? This drug is given in a hospital or clinic and will not be stored at home. NOTE: This sheet is a summary. It may not cover all possible information. If you have questions about this medicine, talk to your doctor, pharmacist, or health care provider.  2012, Elsevier/Gold Standard. (07/10/2007 6:45:54 PM)

## 2011-11-18 ENCOUNTER — Telehealth: Payer: Self-pay | Admitting: *Deleted

## 2011-11-18 NOTE — Telephone Encounter (Signed)
Message copied by Augusto Garbe on Fri Nov 18, 2011 12:32 PM ------      Message from: Yetta Glassman H      Created: Thu Nov 17, 2011  9:19 AM      Contact: [phone]       Not first chemo, first gemzar [place].

## 2011-11-18 NOTE — Telephone Encounter (Signed)
Called patient to follow up after chemotherapy treatment.  Message left requesting a return call.

## 2011-11-24 ENCOUNTER — Encounter: Payer: Self-pay | Admitting: Physician Assistant

## 2011-11-24 ENCOUNTER — Ambulatory Visit (HOSPITAL_BASED_OUTPATIENT_CLINIC_OR_DEPARTMENT_OTHER): Payer: Medicare Other | Admitting: Physician Assistant

## 2011-11-24 ENCOUNTER — Other Ambulatory Visit (HOSPITAL_BASED_OUTPATIENT_CLINIC_OR_DEPARTMENT_OTHER): Payer: Medicare Other | Admitting: Lab

## 2011-11-24 ENCOUNTER — Telehealth: Payer: Self-pay | Admitting: Internal Medicine

## 2011-11-24 ENCOUNTER — Ambulatory Visit (HOSPITAL_BASED_OUTPATIENT_CLINIC_OR_DEPARTMENT_OTHER): Payer: Medicare Other

## 2011-11-24 VITALS — BP 103/57 | HR 90 | Temp 98.1°F | Resp 20 | Ht 69.5 in | Wt 207.3 lb

## 2011-11-24 DIAGNOSIS — C3490 Malignant neoplasm of unspecified part of unspecified bronchus or lung: Secondary | ICD-10-CM

## 2011-11-24 DIAGNOSIS — D6959 Other secondary thrombocytopenia: Secondary | ICD-10-CM

## 2011-11-24 DIAGNOSIS — C3430 Malignant neoplasm of lower lobe, unspecified bronchus or lung: Secondary | ICD-10-CM

## 2011-11-24 DIAGNOSIS — Z5111 Encounter for antineoplastic chemotherapy: Secondary | ICD-10-CM

## 2011-11-24 LAB — CBC WITH DIFFERENTIAL/PLATELET
BASO%: 0.9 % (ref 0.0–2.0)
EOS%: 3.7 % (ref 0.0–7.0)
HGB: 16 g/dL (ref 13.0–17.1)
MCH: 30.3 pg (ref 27.2–33.4)
MCHC: 34 g/dL (ref 32.0–36.0)
MCV: 89.2 fL (ref 79.3–98.0)
MONO%: 3.1 % (ref 0.0–14.0)
RBC: 5.29 10*6/uL (ref 4.20–5.82)
RDW: 14.1 % (ref 11.0–14.6)
lymph#: 0.9 10*3/uL (ref 0.9–3.3)

## 2011-11-24 LAB — COMPREHENSIVE METABOLIC PANEL (CC13)
ALT: 25 U/L (ref 0–55)
AST: 22 U/L (ref 5–34)
Albumin: 3.9 g/dL (ref 3.5–5.0)
Alkaline Phosphatase: 62 U/L (ref 40–150)
BUN: 28 mg/dL — ABNORMAL HIGH (ref 7.0–26.0)
Calcium: 9.4 mg/dL (ref 8.4–10.4)
Chloride: 104 mEq/L (ref 98–107)
Potassium: 4 mEq/L (ref 3.5–5.1)
Sodium: 137 mEq/L (ref 136–145)

## 2011-11-24 MED ORDER — ONDANSETRON 8 MG/50ML IVPB (CHCC)
8.0000 mg | Freq: Once | INTRAVENOUS | Status: DC
  Administered 2011-11-24: 8 mg via INTRAVENOUS

## 2011-11-24 MED ORDER — HEPARIN SOD (PORK) LOCK FLUSH 100 UNIT/ML IV SOLN
500.0000 [IU] | Freq: Once | INTRAVENOUS | Status: AC | PRN
  Administered 2011-11-24: 500 [IU]
  Filled 2011-11-24: qty 5

## 2011-11-24 MED ORDER — SODIUM CHLORIDE 0.9 % IV SOLN
Freq: Once | INTRAVENOUS | Status: DC
  Administered 2011-11-24: 16:00:00 via INTRAVENOUS

## 2011-11-24 MED ORDER — SODIUM CHLORIDE 0.9 % IJ SOLN
10.0000 mL | INTRAMUSCULAR | Status: AC | PRN
  Administered 2011-11-24: 10 mL
  Filled 2011-11-24: qty 10

## 2011-11-24 MED ORDER — SODIUM CHLORIDE 0.9 % IV SOLN: 1000.0000 mg/m2 | Freq: Once | INTRAVENOUS | Status: DC

## 2011-11-24 MED ORDER — DEXAMETHASONE SODIUM PHOSPHATE 10 MG/ML IJ SOLN
10.0000 mg | Freq: Once | INTRAMUSCULAR | Status: DC
  Administered 2011-11-24: 10 mg via INTRAVENOUS

## 2011-11-24 NOTE — Patient Instructions (Addendum)
Follow up in 2 weeks prior to your next cycle of chemotherapy 

## 2011-11-24 NOTE — Telephone Encounter (Signed)
Printed and gv pt schedule for sept. Pt aware.

## 2011-11-24 NOTE — Progress Notes (Unsigned)
Treatment cancelled per Adrena because of low platelets.

## 2011-11-29 ENCOUNTER — Other Ambulatory Visit: Payer: Self-pay | Admitting: Certified Registered Nurse Anesthetist

## 2011-11-29 ENCOUNTER — Telehealth: Payer: Self-pay | Admitting: Medical Oncology

## 2011-11-29 NOTE — Telephone Encounter (Signed)
Called wife and told her to keep his next appt that Dr Donnald Garre is not going to r/s the chemo that was held.

## 2011-12-03 NOTE — Progress Notes (Signed)
[facility] Telephone:([phone]) [phone]   Fax:([phone]) [phone]  OFFICE PROGRESS NOTE  [name], [name] [address] [place] [address]  PRINCIPAL DIAGNOSIS: Stage IIIB/IV non-small cell lung cancer diagnosed in June 2012.   PRIOR THERAPY: Status post a course of concurrent chemoradiation with weekly carboplatin and paclitaxel. Last dose of chemotherapy was given on November 01, 2010.   CURRENT THERAPY: Systemic chemotherapy with gemcitabine 1000 mg per meter square given on days 1 and 8 every 3 weeks status post day 1 of cycle 1.  INTERVAL HISTORY: [name] 76 y.o. male returns to the clinic today for a symptom management and followup visit accompanied by his wife. Patient states after receiving day 1 of cycle 1 of his systemic chemotherapy with gemcitabine that he had a couple of days of generalized malaise, feeling as though he had the flu. He had occasional queasiness without vomiting. He has noticed that he has decreased cough. He has occasional morning secretions that are yellow to hand. He is noticed that his weight has decreased and he is unsure as if this is related to his chemotherapy versus his diabetes mellitus. His blood sugars have been about 200 although his fasting blood sugars are averaging about 160 recently. He voiced no other specific complaints.   MEDICAL HISTORY: Past Medical History  Diagnosis Date  . Cancer   . Diabetes mellitus     ALLERGIES:  is allergic to codeine and penicillins.  MEDICATIONS:  Current Outpatient Prescriptions  Medication Sig Dispense Refill  . enalapril (VASOTEC) 5 MG tablet Take 5 mg by mouth daily.      Marland Kitchen glimepiride (AMARYL) 4 MG tablet Take 4 mg by mouth daily before breakfast. 2 tabs daily with breakfast      . latanoprost (XALATAN) 0.005 % ophthalmic solution 1 drop at bedtime.      . lovastatin (MEVACOR) 20 MG tablet Take 20 mg by mouth at bedtime.      . metFORMIN (GLUCOPHAGE) 1000 MG tablet  Take 1,000 mg by mouth 2 (two) times daily with a meal. 1/2 tab BID      . testosterone cypionate (DEPOTESTOTERONE CYPIONATE) 200 MG/ML injection Inject 2 mg as directed Once every 2 weeks.       No current facility-administered medications for this visit.   Facility-Administered Medications Ordered in Other Visits  Medication Dose Route Frequency Provider Last Rate Last Dose  . sodium chloride 0.9 % injection 10 mL  10 mL Intracatheter PRN Si Gaul, [name]   10 mL at 11/24/11 1637    REVIEW OF SYSTEMS:  Pertinent items are noted in HPI.   PHYSICAL EXAMINATION: General appearance: alert, cooperative and no distress Head: Normocephalic, without obvious abnormality, atraumatic Neck: no adenopathy Lymph nodes: Cervical, supraclavicular, and axillary nodes normal. Resp: clear to auscultation bilaterally Cardio: regular rate and rhythm, S1, S2 normal, no murmur, click, rub or gallop GI: soft, non-tender; bowel sounds normal; no masses,  no organomegaly Extremities: extremities normal, atraumatic, no cyanosis or edema Neurologic: Alert and oriented X 3, normal strength and tone. Normal symmetric reflexes. Normal coordination and gait  ECOG PERFORMANCE STATUS: 1 - Symptomatic but completely ambulatory  Blood pressure 103/57, pulse 90, temperature 98.1 F (36.7 C), resp. rate 20, height 5' 9.5" (1.765 m), weight 207 lb 4.8 oz (94.031 kg).  LABORATORY DATA: Lab Results  Component Value Date   WBC 6.0 11/24/2011   HGB 16.0 11/24/2011   HCT 47.2 11/24/2011   MCV  89.2 11/24/2011   PLT 79* 11/24/2011      Chemistry      Component Value Date/Time   NA 137 11/24/2011 1357   NA 134 10/19/2011 0817   NA 138 12/09/2010 0531   K 4.0 11/24/2011 1357   K 4.5 10/19/2011 0817   K 4.0 12/09/2010 0531   CL 104 11/24/2011 1357   CL 99 10/19/2011 0817   CL 103 12/09/2010 0531   CO2 21* 11/24/2011 1357   CO2 27 10/19/2011 0817   CO2 27 12/09/2010 0531   BUN 28.0* 11/24/2011 1357   BUN 13 10/19/2011 0817   BUN  12 12/09/2010 0531   CREATININE 1.4* 11/24/2011 1357   CREATININE 1.4* 10/19/2011 0817   CREATININE 1.01 12/09/2010 0531      Component Value Date/Time   CALCIUM 9.4 11/24/2011 1357   CALCIUM 9.3 10/19/2011 0817   CALCIUM 9.6 12/09/2010 0531   ALKPHOS 62 11/24/2011 1357   ALKPHOS 48 10/19/2011 0817   ALKPHOS 73 12/08/2010 0600   AST 22 11/24/2011 1357   AST 19 10/19/2011 0817   AST 17 12/08/2010 0600   ALT 25 11/24/2011 1357   ALT 15 12/08/2010 0600   BILITOT 1.00 11/24/2011 1357   BILITOT 0.90 10/19/2011 0817   BILITOT 0.4 12/08/2010 0600       RADIOGRAPHIC STUDIES: Ct Chest W Contrast  10/19/2011  *RADIOLOGY REPORT*  Clinical Data: Lung cancer with shortness of breath.  CT CHEST WITH CONTRAST  Technique:  Multidetector CT imaging of the chest was performed following the standard protocol during bolus administration of intravenous contrast.  Contrast: 80mL OMNIPAQUE IOHEXOL 300 MG/ML  SOLN  Comparison: 07/14/2011  Findings: The right-sided Port-A-Cath tip is in the distal SVC.  No axillary, mediastinal, or hilar lymphadenopathy.  The 11 mm short-axis subcarinal lymph node is stable.  The heart size is normal. Coronary artery calcification is noted.  No pericardial effusion.  Lung windows again demonstrate central lobular emphysema. Irregular pleural thickening in the lower left hemithorax is stable and there is some persistent Volume loss and airspace opacification in the left perihilar region, presumably related to post treatment/radiation changes.  A new 3.2 cm round low density lesion is identified in the left lower lobe.  This area was essentially obscured by the postradiation change on the previous study. The new finding is concerning for a recurrent lesion with central necrosis.  Bone windows reveal no worrisome lytic or sclerotic osseous lesions.  IMPRESSION: 3.2 cm necrotic lesion in the central left lower lobe has imaging features consistent with recurrent neoplasm.  Stable borderline enlarged subcarinal  lymph node.  Original Report Authenticated By: ERIC A. MANSELL, M.D.   Nm Pet Image Restag (ps) Skull Base To Thigh  10/31/2011  *RADIOLOGY REPORT*  Clinical Data: Subsequent treatment strategy for lung cancer. New left lung mass.  NUCLEAR MEDICINE PET SKULL BASE TO THIGH  Fasting Blood Glucose:  02/30/2018  Technique:  18.7 mCi F-18 FDG was injected intravenously. CT data was obtained and used for attenuation correction and anatomic localization only.  (This was not acquired as a diagnostic CT examination.) Additional exam technical data entered on technologist worksheet.  Comparison:  PET CT dated 08/23/2010.  CT chest dated 10/19/2011.  Findings:  Neck: No hypermetabolic lymph nodes in the neck.  Chest:  3.1 x 2.9 cm left lower lobe mass, corresponding to the necrotic lesion on prior CT chest, max SUV 6.6.  Radiation changes in the left paramediastinal region/lower lobe. Trace left pleural effusion/pleural  thickening.  Underlying severe emphysematous changes.  No hypermetabolic mediastinal or hilar nodes.  Focal metabolism adjacent to the patient's right chest port within the SVC (PET image 92), max SUV 10.7, without convincing CT correlate to suggest recurrent tumor.  Abdomen/Pelvis:  No abnormal hypermetabolic activity within the liver, pancreas, adrenal glands, or spleen.  Pancreatic calcifications, likely sequela of prior/chronic pancreatitis.  No hypermetabolic lymph nodes in the abdomen or pelvis.  Extensive colonic diverticulosis.  Normal appendix.  Skeleton:  Degenerative changes.  No focal hypermetabolic activity to suggest skeletal metastasis.  IMPRESSION: 3.1 cm left lower lobe mass, max SUV 6.6, compatible with recurrent tumor.   Original Report Authenticated By: Charline Bills, M.D. ( 10/31/2011 09:47:14 )     ASSESSMENT/PLAN: This is a very pleasant 76 years old white male with history of stage IIIB/4 non-small cell lung cancer status post concurrent chemoradiation completed almost a year  ago. The patient has some evidence for disease progression in the left lower lobe mass which is hypermetabolic on the PET scan. He is currently being treated with systemic chemotherapy in the form of gemcitabine at 1000 mg per meter square given on days 1 and 8 every 3 weeks status post day 1 of cycle 1. The patient was discussed with Dr. Arbutus Ped. Unfortunately his platelets are subtherapeutic at 79,000 and he will be unable receive day 8 of cycle one today as scheduled. He will return in 2 weeks at the beginning of cycle 2 with a repeat CBC differential and C. met. We may consider a dose reduction of the gemcitabine with the start of cycle 2 in deference to his thrombocytopenia. We will see how his platelets recover when he returns in 2 weeks.  Laural Benes, Leanor Voris E, PA-C   All questions were answered. The patient knows to call the clinic with any problems, questions or concerns. We can certainly see the patient much sooner if necessary.  I spent counseling the patient face to face. The total time spent in the appointment was 30 minutes.

## 2011-12-08 ENCOUNTER — Telehealth: Payer: Self-pay | Admitting: Internal Medicine

## 2011-12-08 ENCOUNTER — Ambulatory Visit (HOSPITAL_BASED_OUTPATIENT_CLINIC_OR_DEPARTMENT_OTHER): Payer: Medicare Other | Admitting: Physician Assistant

## 2011-12-08 ENCOUNTER — Encounter: Payer: Self-pay | Admitting: Physician Assistant

## 2011-12-08 ENCOUNTER — Ambulatory Visit (HOSPITAL_BASED_OUTPATIENT_CLINIC_OR_DEPARTMENT_OTHER): Payer: Medicare Other

## 2011-12-08 ENCOUNTER — Other Ambulatory Visit (HOSPITAL_BASED_OUTPATIENT_CLINIC_OR_DEPARTMENT_OTHER): Payer: Medicare Other | Admitting: Lab

## 2011-12-08 VITALS — BP 123/67 | HR 85 | Temp 97.0°F | Resp 18 | Ht 69.5 in | Wt 207.8 lb

## 2011-12-08 DIAGNOSIS — C3430 Malignant neoplasm of lower lobe, unspecified bronchus or lung: Secondary | ICD-10-CM

## 2011-12-08 DIAGNOSIS — C3490 Malignant neoplasm of unspecified part of unspecified bronchus or lung: Secondary | ICD-10-CM

## 2011-12-08 DIAGNOSIS — Z5111 Encounter for antineoplastic chemotherapy: Secondary | ICD-10-CM

## 2011-12-08 LAB — COMPREHENSIVE METABOLIC PANEL (CC13)
ALT: 20 U/L (ref 0–55)
AST: 15 U/L (ref 5–34)
BUN: 21 mg/dL (ref 7.0–26.0)
Calcium: 9.5 mg/dL (ref 8.4–10.4)
Chloride: 102 mEq/L (ref 98–107)
Creatinine: 1.5 mg/dL — ABNORMAL HIGH (ref 0.7–1.3)
Total Bilirubin: 0.7 mg/dL (ref 0.20–1.20)

## 2011-12-08 LAB — CBC WITH DIFFERENTIAL/PLATELET
Basophils Absolute: 0.1 10*3/uL (ref 0.0–0.1)
Eosinophils Absolute: 0.2 10*3/uL (ref 0.0–0.5)
HGB: 16 g/dL (ref 13.0–17.1)
NEUT#: 3.6 10*3/uL (ref 1.5–6.5)
RDW: 14.5 % (ref 11.0–14.6)
WBC: 5.6 10*3/uL (ref 4.0–10.3)
lymph#: 1.1 10*3/uL (ref 0.9–3.3)
nRBC: 0 % (ref 0–0)

## 2011-12-08 MED ORDER — SODIUM CHLORIDE 0.9 % IJ SOLN
10.0000 mL | INTRAMUSCULAR | Status: DC | PRN
  Administered 2011-12-08: 10 mL
  Filled 2011-12-08: qty 10

## 2011-12-08 MED ORDER — SODIUM CHLORIDE 0.9 % IV SOLN
Freq: Once | INTRAVENOUS | Status: AC
  Administered 2011-12-08: 15:00:00 via INTRAVENOUS

## 2011-12-08 MED ORDER — DEXAMETHASONE SODIUM PHOSPHATE 4 MG/ML IJ SOLN
20.0000 mg | Freq: Once | INTRAMUSCULAR | Status: AC
  Administered 2011-12-08: 20 mg via INTRAVENOUS

## 2011-12-08 MED ORDER — SODIUM CHLORIDE 0.9 % IV SOLN
800.0000 mg/m2 | Freq: Once | INTRAVENOUS | Status: AC
  Administered 2011-12-08: 1748 mg via INTRAVENOUS
  Filled 2011-12-08: qty 46

## 2011-12-08 MED ORDER — ONDANSETRON 16 MG/50ML IVPB (CHCC)
16.0000 mg | Freq: Once | INTRAVENOUS | Status: AC
  Administered 2011-12-08: 16 mg via INTRAVENOUS

## 2011-12-08 MED ORDER — SODIUM CHLORIDE 0.9 % IV SOLN
440.0000 mg | Freq: Once | INTRAVENOUS | Status: AC
  Administered 2011-12-08: 440 mg via INTRAVENOUS
  Filled 2011-12-08: qty 44

## 2011-12-08 MED ORDER — HEPARIN SOD (PORK) LOCK FLUSH 100 UNIT/ML IV SOLN
500.0000 [IU] | Freq: Once | INTRAVENOUS | Status: AC | PRN
  Administered 2011-12-08: 500 [IU]
  Filled 2011-12-08: qty 5

## 2011-12-08 NOTE — Telephone Encounter (Signed)
Printed and gv pt appt schedule for OCT °

## 2011-12-08 NOTE — Patient Instructions (Addendum)
[facility] Discharge Instructions for Patients Receiving Chemotherapy  Today you received the following chemotherapy agents Gemzar and Carboplatin.  To help prevent nausea and vomiting after your treatment, we encourage you to take your nausea medication. Begin taking your nausea medication as often as prescribed for by Dr. Arbutus Ped.    If you develop nausea and vomiting that is not controlled by your nausea medication, call the clinic. If it is after clinic hours your family physician or the after hours number for the clinic or go to the Emergency Department.   BELOW ARE SYMPTOMS THAT SHOULD BE REPORTED IMMEDIATELY:  *FEVER GREATER THAN 100.5 F  *CHILLS WITH OR WITHOUT FEVER  NAUSEA AND VOMITING THAT IS NOT CONTROLLED WITH YOUR NAUSEA MEDICATION  *UNUSUAL SHORTNESS OF BREATH  *UNUSUAL BRUISING OR BLEEDING  TENDERNESS IN MOUTH AND THROAT WITH OR WITHOUT PRESENCE OF ULCERS  *URINARY PROBLEMS  *BOWEL PROBLEMS  UNUSUAL RASH Items with * indicate a potential emergency and should be followed up as soon as possible.  One of the nurses will contact you 24 hours after your treatment. Please let the nurse know about any problems that you may have experienced. Feel free to call the clinic you have any questions or concerns. The clinic phone number is [phone].   I have been informed and understand all the instructions given to me. I know to contact the clinic, my physician, or go to the Emergency Department if any problems should occur. I do not have any questions at this time, but understand that I may call the clinic during office hours   should I have any questions or need assistance in obtaining follow up care.    __________________________________________  _____________  __________ Signature of Patient or Authorized Representative            Date                   Time    __________________________________________ Nurse's Signature

## 2011-12-08 NOTE — Patient Instructions (Addendum)
Keep your lab and chemotherapy appointment for 12/15/11 Follow up in 3 weeks on 12/27/11 with labs. Your chemotherapy will remain on 12/29/11

## 2011-12-12 NOTE — Progress Notes (Signed)
[facility] [phone]   Fax:(336) [phone]  OFFICE PROGRESS NOTE  [name], MD [address] [place] [address]  PRINCIPAL DIAGNOSIS: Stage IIIB/IV non-small cell lung cancer diagnosed in June 2012.   PRIOR THERAPY: Status post a course of concurrent chemoradiation with weekly carboplatin and paclitaxel. Last dose of chemotherapy was given on November 01, 2010.   CURRENT THERAPY: Systemic chemotherapy with carboplatin for an AUC of 5 given on day 1 and gemcitabine 1000 mg per meter square given on days 1 and 8 every 3 weeks status post 1 cycle. However due to thrombocytopenia from cycle 2 for urgent he will proceed with carboplatin for an AUC of 5 given on day 1 and gemcitabine at 800 mg per meter squared given on days 1 and 8 every 3 weeks  INTERVAL HISTORY: Jaleil Renwick Hanzlik 76 y.o. male returns to the clinic today for a followup visit accompanied by his wife. He tolerated his first cycle of chemotherapy with carboplatin and gemcitabine relatively well with the exception of some thrombocytopenia. He had no problems with overt bleeding or bruising. He also had a few days of generalized malaise and fatigue as well as some mild nausea. He presents today to proceed with cycle #2 of his systemic chemotherapy with carboplatin and gemcitabine. He voiced no specific complaints today. He does report that his primary care physician is increased his metformin to 1000 mg by mouth twice daily and do to some still high blood sugars.   MEDICAL HISTORY: Past Medical History  Diagnosis Date  . Cancer   . Diabetes mellitus     ALLERGIES:  is allergic to codeine and penicillins.  MEDICATIONS:  Current Outpatient Prescriptions  Medication Sig Dispense Refill  . enalapril (VASOTEC) 5 MG tablet Take 5 mg by mouth daily.      Marland Kitchen glimepiride (AMARYL) 4 MG tablet Take 4 mg by mouth daily before breakfast. 2 tabs daily with breakfast      . latanoprost  (XALATAN) 0.005 % ophthalmic solution 1 drop at bedtime.      . lovastatin (MEVACOR) 20 MG tablet Take 20 mg by mouth at bedtime.      . metFORMIN (GLUCOPHAGE) 1000 MG tablet Take 1,000 mg by mouth 2 (two) times daily with a meal. 1/2 tab BID      . testosterone cypionate (DEPOTESTOTERONE CYPIONATE) 200 MG/ML injection Inject 2 mg as directed Once every 2 weeks.       No current facility-administered medications for this visit.   Facility-Administered Medications Ordered in Other Visits  Medication Dose Route Frequency Provider Last Rate Last Dose  . sodium chloride 0.9 % injection 10 mL  10 mL Intracatheter PRN Si Gaul, MD   10 mL at 11/24/11 1637    REVIEW OF SYSTEMS:  Pertinent items are noted in HPI.   PHYSICAL EXAMINATION: General appearance: alert, cooperative and no distress Head: Normocephalic, without obvious abnormality, atraumatic Neck: no adenopathy Lymph nodes: Cervical, supraclavicular, and axillary nodes normal. Resp: clear to auscultation bilaterally Cardio: regular rate and rhythm, S1, S2 normal, no murmur, click, rub or gallop GI: soft, non-tender; bowel sounds normal; no masses,  no organomegaly Extremities: extremities normal, atraumatic, no cyanosis or edema Neurologic: Alert and oriented X 3, normal strength and tone. Normal symmetric reflexes. Normal coordination and gait  ECOG PERFORMANCE STATUS: 1 - Symptomatic but completely ambulatory  Blood pressure 123/67, pulse 85, temperature 97 F (36.1 C), temperature source Oral, resp. rate 18,  height 5' 9.5" (1.765 m), weight 207 lb 12.8 oz (94.257 kg).  LABORATORY DATA: Lab Results  Component Value Date   WBC 5.6 12/08/2011   HGB 16.0 12/08/2011   HCT 46.7 12/08/2011   MCV 89.0 12/08/2011   PLT 220 12/08/2011      Chemistry      Component Value Date/Time   NA 137 12/08/2011 1257   NA 134 10/19/2011 0817   NA 138 12/09/2010 0531   K 4.3 12/08/2011 1257   K 4.5 10/19/2011 0817   K 4.0 12/09/2010 0531   CL  102 12/08/2011 1257   CL 99 10/19/2011 0817   CL 103 12/09/2010 0531   CO2 24 12/08/2011 1257   CO2 27 10/19/2011 0817   CO2 27 12/09/2010 0531   BUN 21.0 12/08/2011 1257   BUN 13 10/19/2011 0817   BUN 12 12/09/2010 0531   CREATININE 1.5* 12/08/2011 1257   CREATININE 1.4* 10/19/2011 0817   CREATININE 1.01 12/09/2010 0531      Component Value Date/Time   CALCIUM 9.5 12/08/2011 1257   CALCIUM 9.3 10/19/2011 0817   CALCIUM 9.6 12/09/2010 0531   ALKPHOS 60 12/08/2011 1257   ALKPHOS 48 10/19/2011 0817   ALKPHOS 73 12/08/2010 0600   AST 15 12/08/2011 1257   AST 19 10/19/2011 0817   AST 17 12/08/2010 0600   ALT 20 12/08/2011 1257   ALT 15 12/08/2010 0600   BILITOT 0.70 12/08/2011 1257   BILITOT 0.90 10/19/2011 0817   BILITOT 0.4 12/08/2010 0600       RADIOGRAPHIC STUDIES: Ct Chest W Contrast  10/19/2011  *RADIOLOGY REPORT*  Clinical Data: Lung cancer with shortness of breath.  CT CHEST WITH CONTRAST  Technique:  Multidetector CT imaging of the chest was performed following the standard protocol during bolus administration of intravenous contrast.  Contrast: 80mL OMNIPAQUE IOHEXOL 300 MG/ML  SOLN  Comparison: 07/14/2011  Findings: The right-sided Port-A-Cath tip is in the distal SVC.  No axillary, mediastinal, or hilar lymphadenopathy.  The 11 mm short-axis subcarinal lymph node is stable.  The heart size is normal. Coronary artery calcification is noted.  No pericardial effusion.  Lung windows again demonstrate central lobular emphysema. Irregular pleural thickening in the lower left hemithorax is stable and there is some persistent Volume loss and airspace opacification in the left perihilar region, presumably related to post treatment/radiation changes.  A new 3.2 cm round low density lesion is identified in the left lower lobe.  This area was essentially obscured by the postradiation change on the previous study. The new finding is concerning for a recurrent lesion with central necrosis.  Bone windows reveal no worrisome  lytic or sclerotic osseous lesions.  IMPRESSION: 3.2 cm necrotic lesion in the central left lower lobe has imaging features consistent with recurrent neoplasm.  Stable borderline enlarged subcarinal lymph node.  Original Report Authenticated By: ERIC A. MANSELL, M.D.   Nm Pet Image Restag (ps) Skull Base To Thigh  10/31/2011  *RADIOLOGY REPORT*  Clinical Data: Subsequent treatment strategy for lung cancer. New left lung mass.  NUCLEAR MEDICINE PET SKULL BASE TO THIGH  Fasting Blood Glucose:  02/30/2018  Technique:  18.7 mCi F-18 FDG was injected intravenously. CT data was obtained and used for attenuation correction and anatomic localization only.  (This was not acquired as a diagnostic CT examination.) Additional exam technical data entered on technologist worksheet.  Comparison:  PET CT dated 08/23/2010.  CT chest dated 10/19/2011.  Findings:  Neck: No hypermetabolic lymph nodes  in the neck.  Chest:  3.1 x 2.9 cm left lower lobe mass, corresponding to the necrotic lesion on prior CT chest, max SUV 6.6.  Radiation changes in the left paramediastinal region/lower lobe. Trace left pleural effusion/pleural thickening.  Underlying severe emphysematous changes.  No hypermetabolic mediastinal or hilar nodes.  Focal metabolism adjacent to the patient's right chest port within the SVC (PET image 92), max SUV 10.7, without convincing CT correlate to suggest recurrent tumor.  Abdomen/Pelvis:  No abnormal hypermetabolic activity within the liver, pancreas, adrenal glands, or spleen.  Pancreatic calcifications, likely sequela of prior/chronic pancreatitis.  No hypermetabolic lymph nodes in the abdomen or pelvis.  Extensive colonic diverticulosis.  Normal appendix.  Skeleton:  Degenerative changes.  No focal hypermetabolic activity to suggest skeletal metastasis.  IMPRESSION: 3.1 cm left lower lobe mass, max SUV 6.6, compatible with recurrent tumor.   Original Report Authenticated By: Charline Bills, M.D. ( 10/31/2011  09:47:14 )     ASSESSMENT/PLAN: This is a very pleasant 76 years old white male with history of stage IIIB/4 non-small cell lung cancer status post concurrent chemoradiation completed almost a year ago. The patient has some evidence for disease progression in the left lower lobe mass which is hypermetabolic on the PET scan. He is currently being treated with systemic chemotherapy in the form of carboplatin for an AUC of 5 gemcitabine at 1000 mg per meter square given on days 1 and 8 every 3 weeks status post 1 cycle. The patient was discussed with Dr. Arbutus Ped. Do to her metastatic pain he and his gemcitabine will be decreased to 800 mg per meter squared from cycle to going forward. He'll proceed with cycle #2 of his systemic chemotherapy with carboplatin for an AUC of 5 given on day 1 and gemcitabine at 800 mg per meter square given on days 1 and 8 every 3 weeks. He will continue with weekly labs consisting of a CBC differential and C. met. He'll return in 3 weeks prior to cycle #3 with a repeat CBC differential and C. met.   Laural Benes, Merlin Ege E, PA-C   All questions were answered. The patient knows to call the clinic with any problems, questions or concerns. We can certainly see the patient much sooner if necessary.  I spent counseling the patient face to face. The total time spent in the appointment was 30 minutes.

## 2011-12-15 ENCOUNTER — Other Ambulatory Visit: Payer: Self-pay | Admitting: Oncology

## 2011-12-15 ENCOUNTER — Other Ambulatory Visit (HOSPITAL_BASED_OUTPATIENT_CLINIC_OR_DEPARTMENT_OTHER): Payer: Medicare Other | Admitting: Lab

## 2011-12-15 ENCOUNTER — Ambulatory Visit (HOSPITAL_BASED_OUTPATIENT_CLINIC_OR_DEPARTMENT_OTHER): Payer: Medicare Other

## 2011-12-15 DIAGNOSIS — C3430 Malignant neoplasm of lower lobe, unspecified bronchus or lung: Secondary | ICD-10-CM

## 2011-12-15 DIAGNOSIS — Z5111 Encounter for antineoplastic chemotherapy: Secondary | ICD-10-CM

## 2011-12-15 DIAGNOSIS — C3490 Malignant neoplasm of unspecified part of unspecified bronchus or lung: Secondary | ICD-10-CM

## 2011-12-15 LAB — CBC WITH DIFFERENTIAL/PLATELET
Basophils Absolute: 0.1 10*3/uL (ref 0.0–0.1)
HCT: 44 % (ref 38.4–49.9)
HGB: 15.4 g/dL (ref 13.0–17.1)
LYMPH%: 21 % (ref 14.0–49.0)
MONO#: 0.1 10*3/uL (ref 0.1–0.9)
NEUT%: 73.5 % (ref 39.0–75.0)
Platelets: 127 10*3/uL — ABNORMAL LOW (ref 140–400)
WBC: 4.5 10*3/uL (ref 4.0–10.3)
lymph#: 1 10*3/uL (ref 0.9–3.3)

## 2011-12-15 LAB — COMPREHENSIVE METABOLIC PANEL (CC13)
CO2: 18 mEq/L — ABNORMAL LOW (ref 22–29)
Glucose: 233 mg/dl — ABNORMAL HIGH (ref 70–99)
Sodium: 135 mEq/L — ABNORMAL LOW (ref 136–145)
Total Bilirubin: 0.8 mg/dL (ref 0.20–1.20)
Total Protein: 7.6 g/dL (ref 6.4–8.3)

## 2011-12-15 MED ORDER — SODIUM CHLORIDE 0.9 % IJ SOLN
10.0000 mL | INTRAMUSCULAR | Status: DC | PRN
  Administered 2011-12-15: 10 mL
  Filled 2011-12-15: qty 10

## 2011-12-15 MED ORDER — SODIUM CHLORIDE 0.9 % IV SOLN
Freq: Once | INTRAVENOUS | Status: AC
  Administered 2011-12-15: 15:00:00 via INTRAVENOUS

## 2011-12-15 MED ORDER — HEPARIN SOD (PORK) LOCK FLUSH 100 UNIT/ML IV SOLN
500.0000 [IU] | Freq: Once | INTRAVENOUS | Status: AC | PRN
  Administered 2011-12-15: 500 [IU]
  Filled 2011-12-15: qty 5

## 2011-12-15 MED ORDER — DEXAMETHASONE SODIUM PHOSPHATE 10 MG/ML IJ SOLN
10.0000 mg | Freq: Once | INTRAMUSCULAR | Status: AC
  Administered 2011-12-15: 10 mg via INTRAVENOUS

## 2011-12-15 MED ORDER — SODIUM CHLORIDE 0.9 % IV SOLN
800.0000 mg/m2 | Freq: Once | INTRAVENOUS | Status: AC
  Administered 2011-12-15: 1748 mg via INTRAVENOUS
  Filled 2011-12-15: qty 46

## 2011-12-15 MED ORDER — ONDANSETRON 8 MG/50ML IVPB (CHCC)
8.0000 mg | Freq: Once | INTRAVENOUS | Status: AC
  Administered 2011-12-15: 8 mg via INTRAVENOUS

## 2011-12-15 NOTE — Patient Instructions (Signed)
[facility] Discharge Instructions for Patients Receiving Chemotherapy  Today you received the following chemotherapy agents Gemzar.  To help prevent nausea and vomiting after your treatment, we encourage you to take your nausea medication as prescribed by Dr Arbutus Ped.   If you develop nausea and vomiting that is not controlled by your nausea medication, call the clinic. If it is after clinic hours your family physician or the after hours number for the clinic or go to the Emergency Department.   BELOW ARE SYMPTOMS THAT SHOULD BE REPORTED IMMEDIATELY:  *FEVER GREATER THAN 100.5 F  *CHILLS WITH OR WITHOUT FEVER  NAUSEA AND VOMITING THAT IS NOT CONTROLLED WITH YOUR NAUSEA MEDICATION  *UNUSUAL SHORTNESS OF BREATH  *UNUSUAL BRUISING OR BLEEDING  TENDERNESS IN MOUTH AND THROAT WITH OR WITHOUT PRESENCE OF ULCERS  *URINARY PROBLEMS  *BOWEL PROBLEMS  UNUSUAL RASH Items with * indicate a potential emergency and should be followed up as soon as possible.  Feel free to call the clinic you have any questions or concerns. The clinic phone number is [phone].

## 2011-12-19 ENCOUNTER — Other Ambulatory Visit: Payer: Self-pay | Admitting: Certified Registered Nurse Anesthetist

## 2011-12-27 ENCOUNTER — Telehealth: Payer: Self-pay | Admitting: Internal Medicine

## 2011-12-27 ENCOUNTER — Ambulatory Visit (HOSPITAL_BASED_OUTPATIENT_CLINIC_OR_DEPARTMENT_OTHER): Payer: Medicare Other | Admitting: Physician Assistant

## 2011-12-27 ENCOUNTER — Other Ambulatory Visit (HOSPITAL_BASED_OUTPATIENT_CLINIC_OR_DEPARTMENT_OTHER): Payer: Medicare Other | Admitting: Lab

## 2011-12-27 VITALS — BP 115/68 | HR 95 | Temp 97.2°F | Resp 20 | Ht 69.5 in | Wt 204.9 lb

## 2011-12-27 DIAGNOSIS — C3430 Malignant neoplasm of lower lobe, unspecified bronchus or lung: Secondary | ICD-10-CM

## 2011-12-27 DIAGNOSIS — D6959 Other secondary thrombocytopenia: Secondary | ICD-10-CM

## 2011-12-27 DIAGNOSIS — D702 Other drug-induced agranulocytosis: Secondary | ICD-10-CM

## 2011-12-27 DIAGNOSIS — C3490 Malignant neoplasm of unspecified part of unspecified bronchus or lung: Secondary | ICD-10-CM

## 2011-12-27 LAB — CBC WITH DIFFERENTIAL/PLATELET
BASO%: 1.1 % (ref 0.0–2.0)
HCT: 35.6 % — ABNORMAL LOW (ref 38.4–49.9)
LYMPH%: 28 % (ref 14.0–49.0)
MCH: 30.8 pg (ref 27.2–33.4)
MCHC: 34.6 g/dL (ref 32.0–36.0)
MCV: 89.1 fL (ref 79.3–98.0)
MONO#: 0.2 10*3/uL (ref 0.1–0.9)
MONO%: 9.5 % (ref 0.0–14.0)
NEUT%: 55.2 % (ref 39.0–75.0)
Platelets: 73 10*3/uL — ABNORMAL LOW (ref 140–400)
RBC: 3.99 10*6/uL — ABNORMAL LOW (ref 4.20–5.82)

## 2011-12-27 LAB — COMPREHENSIVE METABOLIC PANEL (CC13)
ALT: 36 U/L (ref 0–55)
Alkaline Phosphatase: 60 U/L (ref 40–150)
CO2: 24 mEq/L (ref 22–29)
Creatinine: 1.6 mg/dL — ABNORMAL HIGH (ref 0.7–1.3)
Sodium: 135 mEq/L — ABNORMAL LOW (ref 136–145)
Total Bilirubin: 0.8 mg/dL (ref 0.20–1.20)
Total Protein: 6.9 g/dL (ref 6.4–8.3)

## 2011-12-27 NOTE — Patient Instructions (Addendum)
Return for chemotherapy on 12/29/11, your labs will be rechecked Follow up with Dr. Arbutus Ped in 3 weeks with a restaging CT scan of your chest abdomen and pelvis to re-evaluate your disease. If you do not receive chemotherapy on 12/29/11 because your blood counts are sub optimal, everything will be rescheduled by one week

## 2011-12-27 NOTE — Telephone Encounter (Signed)
appts made and printed for pt pt aware that 11/7 will be moved up

## 2011-12-28 ENCOUNTER — Telehealth: Payer: Self-pay | Admitting: *Deleted

## 2011-12-28 NOTE — Telephone Encounter (Signed)
Per staff message and POF I have adjusted 11/7 appt. JMW

## 2011-12-29 ENCOUNTER — Ambulatory Visit: Payer: Medicare Other

## 2011-12-29 ENCOUNTER — Other Ambulatory Visit (HOSPITAL_BASED_OUTPATIENT_CLINIC_OR_DEPARTMENT_OTHER): Payer: Medicare Other | Admitting: Lab

## 2011-12-29 ENCOUNTER — Other Ambulatory Visit: Payer: Self-pay | Admitting: Lab

## 2011-12-29 DIAGNOSIS — C3490 Malignant neoplasm of unspecified part of unspecified bronchus or lung: Secondary | ICD-10-CM

## 2011-12-29 DIAGNOSIS — C3430 Malignant neoplasm of lower lobe, unspecified bronchus or lung: Secondary | ICD-10-CM

## 2011-12-29 LAB — CBC WITH DIFFERENTIAL/PLATELET
Eosinophils Absolute: 0.1 10*3/uL (ref 0.0–0.5)
HCT: 35 % — ABNORMAL LOW (ref 38.4–49.9)
LYMPH%: 33.8 % (ref 14.0–49.0)
MCHC: 35.4 g/dL (ref 32.0–36.0)
MCV: 87.1 fL (ref 79.3–98.0)
MONO#: 0.4 10*3/uL (ref 0.1–0.9)
MONO%: 17.7 % — ABNORMAL HIGH (ref 0.0–14.0)
NEUT#: 1 10*3/uL — ABNORMAL LOW (ref 1.5–6.5)
NEUT%: 41.6 % (ref 39.0–75.0)
Platelets: 131 10*3/uL — ABNORMAL LOW (ref 140–400)
WBC: 2.3 10*3/uL — ABNORMAL LOW (ref 4.0–10.3)

## 2011-12-29 NOTE — Progress Notes (Signed)
Delay treatment 1 week per Dr. Arbutus Ped.

## 2012-01-02 ENCOUNTER — Telehealth: Payer: Self-pay | Admitting: *Deleted

## 2012-01-02 NOTE — Progress Notes (Signed)
[facility] Telephone:(336) [phone]   Fax:(336) [phone]  OFFICE PROGRESS NOTE  [name], MD [address] [place] [address]  PRINCIPAL DIAGNOSIS: Stage IIIB/IV non-small cell lung cancer diagnosed in June 2012.   PRIOR THERAPY: Status post a course of concurrent chemoradiation with weekly carboplatin and paclitaxel. Last dose of chemotherapy was given on November 01, 2010.   CURRENT THERAPY: Systemic chemotherapy with carboplatin for an AUC of 5 given on day 1 and gemcitabine 1000 mg per meter square given on days 1 and 8 every 3 weeks status post 1 cycle. However due to thrombocytopenia from cycle 2 forward he will proceed with carboplatin for an AUC of 5 given on day 1 and gemcitabine at 800 mg per meter squared given on days 1 and 8 every 3 weeks. Status post 2 cycles.  INTERVAL HISTORY: Bilaal Desautel Pepin 76 y.o. male returns to the clinic today for a followup visit accompanied by his wife. He tolerated his first cycle of chemotherapy with carboplatin and gemcitabine relatively well with the exception of some thrombocytopenia. He had no problems with overt bleeding or bruising. He reports some dizziness with position change or when rinsing his hair in the shower. He has some occasional diarrhea but it is well managed with Imodium. He reports shortness of breath with exertion such as when walking up an incline or carrying things.He denied fever or chills.  MEDICAL HISTORY: Past Medical History  Diagnosis Date  . Cancer   . Diabetes mellitus     ALLERGIES:  is allergic to codeine and penicillins.  MEDICATIONS:  Current Outpatient Prescriptions  Medication Sig Dispense Refill  . enalapril (VASOTEC) 5 MG tablet Take 5 mg by mouth daily.      Marland Kitchen glimepiride (AMARYL) 4 MG tablet Take 4 mg by mouth daily before breakfast. 2 tabs daily with breakfast      . latanoprost (XALATAN) 0.005 % ophthalmic solution 1 drop at bedtime.      . lovastatin  (MEVACOR) 20 MG tablet Take 20 mg by mouth at bedtime.      . metFORMIN (GLUCOPHAGE) 1000 MG tablet Take 1,000 mg by mouth 2 (two) times daily with a meal. 1 1/2 tabs every morning, 1 tab every evening      . testosterone cypionate (DEPOTESTOTERONE CYPIONATE) 200 MG/ML injection Inject 2 mg as directed Once every 2 weeks.       No current facility-administered medications for this visit.   Facility-Administered Medications Ordered in Other Visits  Medication Dose Route Frequency Provider Last Rate Last Dose  . sodium chloride 0.9 % injection 10 mL  10 mL Intracatheter PRN Si Gaul, MD   10 mL at 11/24/11 1637    REVIEW OF SYSTEMS:  Pertinent items are noted in HPI.   PHYSICAL EXAMINATION: General appearance: alert, cooperative and no distress Head: Normocephalic, without obvious abnormality, atraumatic Neck: no adenopathy Lymph nodes: Cervical, supraclavicular, and axillary nodes normal. Resp: clear to auscultation bilaterally Cardio: regular rate and rhythm, S1, S2 normal, no murmur, click, rub or gallop GI: soft, non-tender; bowel sounds normal; no masses,  no organomegaly Extremities: extremities normal, atraumatic, no cyanosis or edema Neurologic: Alert and oriented X 3, normal strength and tone. Normal symmetric reflexes. Normal coordination and gait  ECOG PERFORMANCE STATUS: 1 - Symptomatic but completely ambulatory  Blood pressure 115/68, pulse 95, temperature 97.2 F (36.2 C), temperature source Oral, resp. rate 20, height 5' 9.5" (1.765 m), weight 204 lb 14.4 oz (  92.942 kg).  LABORATORY DATA: Lab Results  Component Value Date   WBC 2.3* 12/29/2011   HGB 12.4* 12/29/2011   HCT 35.0* 12/29/2011   MCV 87.1 12/29/2011   PLT 131* 12/29/2011      Chemistry      Component Value Date/Time   NA 135* 12/27/2011 1443   NA 134 10/19/2011 0817   NA 138 12/09/2010 0531   K 4.7 12/27/2011 1443   K 4.5 10/19/2011 0817   K 4.0 12/09/2010 0531   CL 103 12/27/2011 1443   CL 99  10/19/2011 0817   CL 103 12/09/2010 0531   CO2 24 12/27/2011 1443   CO2 27 10/19/2011 0817   CO2 27 12/09/2010 0531   BUN 20.0 12/27/2011 1443   BUN 13 10/19/2011 0817   BUN 12 12/09/2010 0531   CREATININE 1.6* 12/27/2011 1443   CREATININE 1.4* 10/19/2011 0817   CREATININE 1.01 12/09/2010 0531      Component Value Date/Time   CALCIUM 9.6 12/27/2011 1443   CALCIUM 9.3 10/19/2011 0817   CALCIUM 9.6 12/09/2010 0531   ALKPHOS 60 12/27/2011 1443   ALKPHOS 48 10/19/2011 0817   ALKPHOS 73 12/08/2010 0600   AST 22 12/27/2011 1443   AST 19 10/19/2011 0817   AST 17 12/08/2010 0600   ALT 36 12/27/2011 1443   ALT 15 12/08/2010 0600   BILITOT 0.80 12/27/2011 1443   BILITOT 0.90 10/19/2011 0817   BILITOT 0.4 12/08/2010 0600       RADIOGRAPHIC STUDIES: Ct Chest W Contrast  10/19/2011  *RADIOLOGY REPORT*  Clinical Data: Lung cancer with shortness of breath.  CT CHEST WITH CONTRAST  Technique:  Multidetector CT imaging of the chest was performed following the standard protocol during bolus administration of intravenous contrast.  Contrast: 80mL OMNIPAQUE IOHEXOL 300 MG/ML  SOLN  Comparison: 07/14/2011  Findings: The right-sided Port-A-Cath tip is in the distal SVC.  No axillary, mediastinal, or hilar lymphadenopathy.  The 11 mm short-axis subcarinal lymph node is stable.  The heart size is normal. Coronary artery calcification is noted.  No pericardial effusion.  Lung windows again demonstrate central lobular emphysema. Irregular pleural thickening in the lower left hemithorax is stable and there is some persistent Volume loss and airspace opacification in the left perihilar region, presumably related to post treatment/radiation changes.  A new 3.2 cm round low density lesion is identified in the left lower lobe.  This area was essentially obscured by the postradiation change on the previous study. The new finding is concerning for a recurrent lesion with central necrosis.  Bone windows reveal no worrisome lytic or sclerotic  osseous lesions.  IMPRESSION: 3.2 cm necrotic lesion in the central left lower lobe has imaging features consistent with recurrent neoplasm.  Stable borderline enlarged subcarinal lymph node.  Original Report Authenticated By: ERIC A. MANSELL, M.D.   Nm Pet Image Restag (ps) Skull Base To Thigh  10/31/2011  *RADIOLOGY REPORT*  Clinical Data: Subsequent treatment strategy for lung cancer. New left lung mass.  NUCLEAR MEDICINE PET SKULL BASE TO THIGH  Fasting Blood Glucose:  02/30/2018  Technique:  18.7 mCi F-18 FDG was injected intravenously. CT data was obtained and used for attenuation correction and anatomic localization only.  (This was not acquired as a diagnostic CT examination.) Additional exam technical data entered on technologist worksheet.  Comparison:  PET CT dated 08/23/2010.  CT chest dated 10/19/2011.  Findings:  Neck: No hypermetabolic lymph nodes in the neck.  Chest:  3.1 x 2.9 cm  left lower lobe mass, corresponding to the necrotic lesion on prior CT chest, max SUV 6.6.  Radiation changes in the left paramediastinal region/lower lobe. Trace left pleural effusion/pleural thickening.  Underlying severe emphysematous changes.  No hypermetabolic mediastinal or hilar nodes.  Focal metabolism adjacent to the patient's right chest port within the SVC (PET image 92), max SUV 10.7, without convincing CT correlate to suggest recurrent tumor.  Abdomen/Pelvis:  No abnormal hypermetabolic activity within the liver, pancreas, adrenal glands, or spleen.  Pancreatic calcifications, likely sequela of prior/chronic pancreatitis.  No hypermetabolic lymph nodes in the abdomen or pelvis.  Extensive colonic diverticulosis.  Normal appendix.  Skeleton:  Degenerative changes.  No focal hypermetabolic activity to suggest skeletal metastasis.  IMPRESSION: 3.1 cm left lower lobe mass, max SUV 6.6, compatible with recurrent tumor.   Original Report Authenticated By: Charline Bills, M.D. ( 10/31/2011 09:47:14 )      ASSESSMENT/PLAN: This is a very pleasant 75 years old white male with history of stage IIIB/4 non-small cell lung cancer status post concurrent chemoradiation completed almost a year ago. The patient has some evidence for disease progression in the left lower lobe mass which is hypermetabolic on the PET scan. He is currently being treated with systemic chemotherapy in the form of carboplatin for an AUC of 5 gemcitabine at 1000 mg per meter square given on days 1 and 8 every 3 weeks status post 2 cycles. The patient was discussed with Dr. Arbutus Ped. His ANC is 1.3 and the platelet count is 73, both sub optimal to proceed with chemotherapy. He is scheduled for chemotherapy on 12/29/11, if his labs are in therapeutic range he will proceed with cycle #3. He will follow up with Dr. Arbutus Ped in 3 weeks with a restaging CT scan of the chest, abdomen and pelvis to re-evaluate his disease. If, however, his labs are still sub-optimal, cycle #3 and subsequent appointments will be postponed by one week.  He will continue with weekly labs consisting of a CBC differential and C. met.   Laural Benes, Lazette Estala E, PA-C   All questions were answered. The patient knows to call the clinic with any problems, questions or concerns. We can certainly see the patient much sooner if necessary.  I spent counseling the patient face to face. The total time spent in the appointment was 30 minutes.

## 2012-01-02 NOTE — Telephone Encounter (Signed)
Most recent progress note faxed via EPIC to Dr Jeannetta Nap office [phone].  SLJ

## 2012-01-05 ENCOUNTER — Other Ambulatory Visit: Payer: Self-pay | Admitting: Lab

## 2012-01-05 ENCOUNTER — Other Ambulatory Visit: Payer: Self-pay | Admitting: Internal Medicine

## 2012-01-05 ENCOUNTER — Other Ambulatory Visit (HOSPITAL_BASED_OUTPATIENT_CLINIC_OR_DEPARTMENT_OTHER): Payer: Medicare Other | Admitting: Lab

## 2012-01-05 ENCOUNTER — Ambulatory Visit (HOSPITAL_BASED_OUTPATIENT_CLINIC_OR_DEPARTMENT_OTHER): Payer: Medicare Other

## 2012-01-05 VITALS — BP 133/79 | HR 97 | Temp 97.0°F | Resp 20

## 2012-01-05 DIAGNOSIS — Z5111 Encounter for antineoplastic chemotherapy: Secondary | ICD-10-CM

## 2012-01-05 DIAGNOSIS — C3430 Malignant neoplasm of lower lobe, unspecified bronchus or lung: Secondary | ICD-10-CM

## 2012-01-05 DIAGNOSIS — C3490 Malignant neoplasm of unspecified part of unspecified bronchus or lung: Secondary | ICD-10-CM

## 2012-01-05 LAB — COMPREHENSIVE METABOLIC PANEL (CC13)
ALT: 14 U/L (ref 0–55)
AST: 13 U/L (ref 5–34)
Albumin: 3.6 g/dL (ref 3.5–5.0)
Calcium: 9.6 mg/dL (ref 8.4–10.4)
Chloride: 105 mEq/L (ref 98–107)
Potassium: 4.2 mEq/L (ref 3.5–5.1)
Total Protein: 7 g/dL (ref 6.4–8.3)

## 2012-01-05 LAB — CBC WITH DIFFERENTIAL/PLATELET
Basophils Absolute: 0.1 10*3/uL (ref 0.0–0.1)
EOS%: 3.2 % (ref 0.0–7.0)
HCT: 37.1 % — ABNORMAL LOW (ref 38.4–49.9)
HGB: 12.9 g/dL — ABNORMAL LOW (ref 13.0–17.1)
LYMPH%: 18.3 % (ref 14.0–49.0)
MCH: 30.9 pg (ref 27.2–33.4)
MCHC: 34.8 g/dL (ref 32.0–36.0)
MCV: 88.8 fL (ref 79.3–98.0)
MONO%: 16.9 % — ABNORMAL HIGH (ref 0.0–14.0)
NEUT%: 60.2 % (ref 39.0–75.0)

## 2012-01-05 MED ORDER — SODIUM CHLORIDE 0.9 % IJ SOLN
10.0000 mL | INTRAMUSCULAR | Status: DC | PRN
  Administered 2012-01-05: 10 mL
  Filled 2012-01-05: qty 10

## 2012-01-05 MED ORDER — HEPARIN SOD (PORK) LOCK FLUSH 100 UNIT/ML IV SOLN
500.0000 [IU] | Freq: Once | INTRAVENOUS | Status: AC | PRN
  Administered 2012-01-05: 500 [IU]
  Filled 2012-01-05: qty 5

## 2012-01-05 MED ORDER — DEXAMETHASONE SODIUM PHOSPHATE 4 MG/ML IJ SOLN
20.0000 mg | Freq: Once | INTRAMUSCULAR | Status: AC
  Administered 2012-01-05: 20 mg via INTRAVENOUS

## 2012-01-05 MED ORDER — SODIUM CHLORIDE 0.9 % IV SOLN
401.5000 mg | Freq: Once | INTRAVENOUS | Status: AC
  Administered 2012-01-05: 400 mg via INTRAVENOUS
  Filled 2012-01-05: qty 40

## 2012-01-05 MED ORDER — SODIUM CHLORIDE 0.9 % IV SOLN
Freq: Once | INTRAVENOUS | Status: AC
  Administered 2012-01-05: 14:00:00 via INTRAVENOUS

## 2012-01-05 MED ORDER — SODIUM CHLORIDE 0.9 % IV SOLN
800.0000 mg/m2 | Freq: Once | INTRAVENOUS | Status: AC
  Administered 2012-01-05: 1748 mg via INTRAVENOUS
  Filled 2012-01-05: qty 46

## 2012-01-05 MED ORDER — ONDANSETRON 16 MG/50ML IVPB (CHCC)
16.0000 mg | Freq: Once | INTRAVENOUS | Status: AC
  Administered 2012-01-05: 16 mg via INTRAVENOUS

## 2012-01-05 NOTE — Patient Instructions (Signed)
[facility] Discharge Instructions for Patients Receiving Chemotherapy  Today you received the following chemotherapy agents Carboplatin/Gemzar.  To help prevent nausea and vomiting after your treatment, we encourage you to take your nausea medication as directed.   If you develop nausea and vomiting that is not controlled by your nausea medication, call the clinic. If it is after clinic hours your family physician or the after hours number for the clinic or go to the Emergency Department.   BELOW ARE SYMPTOMS THAT SHOULD BE REPORTED IMMEDIATELY:  *FEVER GREATER THAN 100.5 F  *CHILLS WITH OR WITHOUT FEVER  NAUSEA AND VOMITING THAT IS NOT CONTROLLED WITH YOUR NAUSEA MEDICATION  *UNUSUAL SHORTNESS OF BREATH  *UNUSUAL BRUISING OR BLEEDING  TENDERNESS IN MOUTH AND THROAT WITH OR WITHOUT PRESENCE OF ULCERS  *URINARY PROBLEMS  *BOWEL PROBLEMS  UNUSUAL RASH Items with * indicate a potential emergency and should be followed up as soon as possible.  Feel free to call the clinic you have any questions or concerns. The clinic phone number is [phone].

## 2012-01-09 ENCOUNTER — Telehealth: Payer: Self-pay | Admitting: Internal Medicine

## 2012-01-09 NOTE — Telephone Encounter (Signed)
s/w wife and moved appt do adrena due to mkm being out of the office,ok per mkm    aom

## 2012-01-10 ENCOUNTER — Telehealth: Payer: Self-pay | Admitting: *Deleted

## 2012-01-10 NOTE — Telephone Encounter (Signed)
Per staff message and POF I have adjusted appt. JMW  

## 2012-01-17 ENCOUNTER — Ambulatory Visit (HOSPITAL_COMMUNITY): Admission: RE | Admit: 2012-01-17 | Payer: Medicare Other | Source: Ambulatory Visit

## 2012-01-17 ENCOUNTER — Ambulatory Visit (HOSPITAL_COMMUNITY)
Admission: RE | Admit: 2012-01-17 | Discharge: 2012-01-17 | Disposition: A | Discharge status: Home / Self Care | Payer: Medicare Other | Source: Ambulatory Visit | Attending: Physician Assistant | Admitting: Physician Assistant

## 2012-01-17 DIAGNOSIS — J479 Bronchiectasis, uncomplicated: Secondary | ICD-10-CM | POA: Insufficient documentation

## 2012-01-17 DIAGNOSIS — R634 Abnormal weight loss: Secondary | ICD-10-CM | POA: Insufficient documentation

## 2012-01-17 DIAGNOSIS — K5730 Diverticulosis of large intestine without perforation or abscess without bleeding: Secondary | ICD-10-CM | POA: Insufficient documentation

## 2012-01-17 DIAGNOSIS — Z923 Personal history of irradiation: Secondary | ICD-10-CM | POA: Insufficient documentation

## 2012-01-17 DIAGNOSIS — J9819 Other pulmonary collapse: Secondary | ICD-10-CM | POA: Insufficient documentation

## 2012-01-17 DIAGNOSIS — R911 Solitary pulmonary nodule: Secondary | ICD-10-CM | POA: Insufficient documentation

## 2012-01-17 DIAGNOSIS — C3490 Malignant neoplasm of unspecified part of unspecified bronchus or lung: Secondary | ICD-10-CM | POA: Insufficient documentation

## 2012-01-17 MED ORDER — IOHEXOL 300 MG/ML  SOLN
100.0000 mL | Freq: Once | INTRAMUSCULAR | Status: AC | PRN
  Administered 2012-01-17: 100 mL via INTRAVENOUS

## 2012-01-19 ENCOUNTER — Ambulatory Visit (HOSPITAL_BASED_OUTPATIENT_CLINIC_OR_DEPARTMENT_OTHER): Payer: Medicare Other

## 2012-01-19 ENCOUNTER — Other Ambulatory Visit (HOSPITAL_BASED_OUTPATIENT_CLINIC_OR_DEPARTMENT_OTHER): Payer: Medicare Other

## 2012-01-19 ENCOUNTER — Ambulatory Visit: Payer: Self-pay | Admitting: Internal Medicine

## 2012-01-19 ENCOUNTER — Ambulatory Visit (HOSPITAL_BASED_OUTPATIENT_CLINIC_OR_DEPARTMENT_OTHER): Payer: Medicare Other | Admitting: Physician Assistant

## 2012-01-19 ENCOUNTER — Encounter: Payer: Self-pay | Admitting: Physician Assistant

## 2012-01-19 ENCOUNTER — Other Ambulatory Visit: Payer: Self-pay | Admitting: Lab

## 2012-01-19 VITALS — BP 132/55 | HR 95 | Temp 98.0°F | Resp 20 | Ht 69.5 in | Wt 208.7 lb

## 2012-01-19 DIAGNOSIS — C3490 Malignant neoplasm of unspecified part of unspecified bronchus or lung: Secondary | ICD-10-CM

## 2012-01-19 DIAGNOSIS — C3430 Malignant neoplasm of lower lobe, unspecified bronchus or lung: Secondary | ICD-10-CM

## 2012-01-19 DIAGNOSIS — K861 Other chronic pancreatitis: Secondary | ICD-10-CM

## 2012-01-19 DIAGNOSIS — K5712 Diverticulitis of small intestine without perforation or abscess without bleeding: Secondary | ICD-10-CM

## 2012-01-19 DIAGNOSIS — Z5111 Encounter for antineoplastic chemotherapy: Secondary | ICD-10-CM

## 2012-01-19 LAB — COMPREHENSIVE METABOLIC PANEL (CC13)
ALT: 22 U/L (ref 0–55)
Albumin: 3.6 g/dL (ref 3.5–5.0)
CO2: 23 mEq/L (ref 22–29)
Potassium: 4 mEq/L (ref 3.5–5.1)
Sodium: 141 mEq/L (ref 136–145)
Total Bilirubin: 0.5 mg/dL (ref 0.20–1.20)
Total Protein: 7.3 g/dL (ref 6.4–8.3)

## 2012-01-19 LAB — CBC WITH DIFFERENTIAL/PLATELET
Eosinophils Absolute: 0.3 10*3/uL (ref 0.0–0.5)
HGB: 11.4 g/dL — ABNORMAL LOW (ref 13.0–17.1)
MCV: 90.6 fL (ref 79.3–98.0)
MONO#: 0.7 10*3/uL (ref 0.1–0.9)
MONO%: 9.7 % (ref 0.0–14.0)
NEUT#: 5.7 10*3/uL (ref 1.5–6.5)
RBC: 3.63 10*6/uL — ABNORMAL LOW (ref 4.20–5.82)
RDW: 18.5 % — ABNORMAL HIGH (ref 11.0–14.6)
WBC: 7.6 10*3/uL (ref 4.0–10.3)
lymph#: 0.9 10*3/uL (ref 0.9–3.3)
nRBC: 0 % (ref 0–0)

## 2012-01-19 MED ORDER — DEXAMETHASONE SODIUM PHOSPHATE 10 MG/ML IJ SOLN
10.0000 mg | Freq: Once | INTRAMUSCULAR | Status: AC
  Administered 2012-01-19: 10 mg via INTRAVENOUS

## 2012-01-19 MED ORDER — SODIUM CHLORIDE 0.9 % IV SOLN
Freq: Once | INTRAVENOUS | Status: AC
  Administered 2012-01-19: 16:00:00 via INTRAVENOUS

## 2012-01-19 MED ORDER — SODIUM CHLORIDE 0.9 % IV SOLN
800.0000 mg/m2 | Freq: Once | INTRAVENOUS | Status: AC
  Administered 2012-01-19: 1748 mg via INTRAVENOUS
  Filled 2012-01-19: qty 46

## 2012-01-19 MED ORDER — HEPARIN SOD (PORK) LOCK FLUSH 100 UNIT/ML IV SOLN
500.0000 [IU] | Freq: Once | INTRAVENOUS | Status: AC | PRN
  Administered 2012-01-19: 500 [IU]
  Filled 2012-01-19: qty 5

## 2012-01-19 MED ORDER — SODIUM CHLORIDE 0.9 % IJ SOLN
10.0000 mL | INTRAMUSCULAR | Status: DC | PRN
  Administered 2012-01-19: 10 mL
  Filled 2012-01-19: qty 10

## 2012-01-19 MED ORDER — ONDANSETRON 8 MG/50ML IVPB (CHCC)
8.0000 mg | Freq: Once | INTRAVENOUS | Status: AC
  Administered 2012-01-19: 8 mg via INTRAVENOUS

## 2012-01-19 NOTE — Patient Instructions (Signed)
[facility] Discharge Instructions for Patients Receiving Chemotherapy  Today you received the following chemotherapy agent Gemzar  To help prevent nausea and vomiting after your treatment, we encourage you to take your nausea medication. Begin taking your nausea medication  as often as prescribed for by Dr Mohamed.    If you develop nausea and vomiting that is not controlled by your nausea medication, call the clinic. If it is after clinic hours your family physician or the after hours number for the clinic or go to the Emergency Department.   BELOW ARE SYMPTOMS THAT SHOULD BE REPORTED IMMEDIATELY:  *FEVER GREATER THAN 100.5 F  *CHILLS WITH OR WITHOUT FEVER  NAUSEA AND VOMITING THAT IS NOT CONTROLLED WITH YOUR NAUSEA MEDICATION  *UNUSUAL SHORTNESS OF BREATH  *UNUSUAL BRUISING OR BLEEDING  TENDERNESS IN MOUTH AND THROAT WITH OR WITHOUT PRESENCE OF ULCERS  *URINARY PROBLEMS  *BOWEL PROBLEMS  UNUSUAL RASH Items with * indicate a potential emergency and should be followed up as soon as possible.  One of the nurses will contact you 24 hours after your treatment. Please let the nurse know about any problems that you may have experienced. Feel free to call the clinic you have any questions or concerns. The clinic phone number is [phone].   I have been informed and understand all the instructions given to me. I know to contact the clinic, my physician, or go to the Emergency Department if any problems should occur. I do not have any questions at this time, but understand that I may call the clinic during office hours   should I have any questions or need assistance in obtaining follow up care.    __________________________________________  _____________  __________ Signature of Patient or Authorized Representative            Date                   Time    __________________________________________ Nurse's Signature    

## 2012-01-20 ENCOUNTER — Telehealth: Payer: Self-pay | Admitting: *Deleted

## 2012-01-20 NOTE — Telephone Encounter (Signed)
ADD ON 02-02-2012 LAB MIDLEVEL TREATMENT  ADD ON 02-08-2012 LAB AND TREATMENT  SENT EMAIL TO MICHELLE AND TANYA TO SET UP PATIENT'S APPOINTMENTS

## 2012-01-25 ENCOUNTER — Telehealth: Payer: Self-pay | Admitting: Medical Oncology

## 2012-01-25 NOTE — Telephone Encounter (Signed)
Reviewed appt with pt

## 2012-01-25 NOTE — Progress Notes (Signed)
[facility] [phone]   [phone]  OFFICE PROGRESS NOTE  [name], MD [address] [place] [address]  PRINCIPAL DIAGNOSIS: Stage IIIB/IV non-small cell lung cancer diagnosed in June 2012.   PRIOR THERAPY: Status post a course of concurrent chemoradiation with weekly carboplatin and paclitaxel. Last dose of chemotherapy was given on November 01, 2010.   CURRENT THERAPY: Systemic chemotherapy with carboplatin for an AUC of 5 given on day 1 and gemcitabine 1000 mg per meter square given on days 1 and 8 every 3 weeks status post 1 cycle. However due to thrombocytopenia from cycle 2 forward he will proceed with carboplatin for an AUC of 5 given on day 1 and gemcitabine at 800 mg per meter squared given on days 1 and 8 every 3 weeks. Status post 3 cycles as well as day 1 of cycle 4.  INTERVAL HISTORY: [name] 76 y.o. male returns to the clinic today for a followup visit accompanied by his wife. He continues to tolerate his chemotherapy with carboplatin and gemcitabine relatively well with the exception of some thrombocytopenia. He had no problems with overt bleeding or bruising. He reports shortness of breath with exertion such as when walking up an incline or carrying things.He denied fever or chills. He voiced no other specific complaints today. He recently had a restaging CT scan of the chest abdomen and pelvis and is here to discuss the results of that study.  MEDICAL HISTORY: Past Medical History  Diagnosis Date  . Cancer   . Diabetes mellitus     ALLERGIES:  is allergic to codeine and penicillins.  MEDICATIONS:  Current Outpatient Prescriptions  Medication Sig Dispense Refill  . enalapril (VASOTEC) 5 MG tablet Take 5 mg by mouth daily.      Marland Kitchen glimepiride (AMARYL) 4 MG tablet Take 4 mg by mouth daily before breakfast. 2 tabs daily with breakfast      . latanoprost (XALATAN) 0.005 % ophthalmic solution 1 drop at  bedtime.      . lovastatin (MEVACOR) 20 MG tablet Take 20 mg by mouth at bedtime.      . metFORMIN (GLUCOPHAGE) 1000 MG tablet Take 1,000 mg by mouth 2 (two) times daily with a meal. 1  tab every morning, 1 tab every evening      . pioglitazone (ACTOS) 30 MG tablet       . testosterone cypionate (DEPOTESTOTERONE CYPIONATE) 200 MG/ML injection Inject 2 mg as directed Once every 2 weeks.       No current facility-administered medications for this visit.   Facility-Administered Medications Ordered in Other Visits  Medication Dose Route Frequency Provider Last Rate Last Dose  . sodium chloride 0.9 % injection 10 mL  10 mL Intracatheter PRN Si Gaul, MD   10 mL at 11/24/11 1637    REVIEW OF SYSTEMS:  Pertinent items are noted in HPI.   PHYSICAL EXAMINATION: General appearance: alert, cooperative and no distress Head: Normocephalic, without obvious abnormality, atraumatic Neck: no adenopathy Lymph nodes: Cervical, supraclavicular, and axillary nodes normal. Resp: clear to auscultation bilaterally Cardio: regular rate and rhythm, S1, S2 normal, no murmur, click, rub or gallop GI: soft, non-tender; bowel sounds normal; no masses,  no organomegaly Extremities: extremities normal, atraumatic, no cyanosis or edema Neurologic: Alert and oriented X 3, normal strength and tone. Normal symmetric reflexes. Normal coordination and gait  ECOG PERFORMANCE STATUS: 1 - Symptomatic but completely ambulatory  Blood pressure 132/55, pulse  95, temperature 98 F (36.7 C), temperature source Oral, resp. rate 20, height 5' 9.5" (1.765 m), weight 208 lb 11.2 oz (94.666 kg).  LABORATORY DATA: Lab Results  Component Value Date   WBC 7.6 01/19/2012   HGB 11.4* 01/19/2012   HCT 32.9* 01/19/2012   MCV 90.6 01/19/2012   PLT 122* 01/19/2012      Chemistry      Component Value Date/Time   NA 141 01/19/2012 1407   NA 134 10/19/2011 0817   NA 138 12/09/2010 0531   K 4.0 01/19/2012 1407   K 4.5 10/19/2011 0817    K 4.0 12/09/2010 0531   CL 109* 01/19/2012 1407   CL 99 10/19/2011 0817   CL 103 12/09/2010 0531   CO2 23 01/19/2012 1407   CO2 27 10/19/2011 0817   CO2 27 12/09/2010 0531   BUN 13.0 01/19/2012 1407   BUN 13 10/19/2011 0817   BUN 12 12/09/2010 0531   CREATININE 1.6* 01/19/2012 1407   CREATININE 1.4* 10/19/2011 0817   CREATININE 1.01 12/09/2010 0531      Component Value Date/Time   CALCIUM 9.2 01/19/2012 1407   CALCIUM 9.3 10/19/2011 0817   CALCIUM 9.6 12/09/2010 0531   ALKPHOS 56 01/19/2012 1407   ALKPHOS 48 10/19/2011 0817   ALKPHOS 73 12/08/2010 0600   AST 17 01/19/2012 1407   AST 19 10/19/2011 0817   AST 17 12/08/2010 0600   ALT 22 01/19/2012 1407   ALT 15 12/08/2010 0600   BILITOT 0.50 01/19/2012 1407   BILITOT 0.90 10/19/2011 0817   BILITOT 0.4 12/08/2010 0600       RADIOGRAPHIC STUDIES: Ct Chest W Contrast  10/19/2011  *RADIOLOGY REPORT*  Clinical Data: Lung cancer with shortness of breath.  CT CHEST WITH CONTRAST  Technique:  Multidetector CT imaging of the chest was performed following the standard protocol during bolus administration of intravenous contrast.  Contrast: 80mL OMNIPAQUE IOHEXOL 300 MG/ML  SOLN  Comparison: 07/14/2011  Findings: The right-sided Port-A-Cath tip is in the distal SVC.  No axillary, mediastinal, or hilar lymphadenopathy.  The 11 mm short-axis subcarinal lymph node is stable.  The heart size is normal. Coronary artery calcification is noted.  No pericardial effusion.  Lung windows again demonstrate central lobular emphysema. Irregular pleural thickening in the lower left hemithorax is stable and there is some persistent Volume loss and airspace opacification in the left perihilar region, presumably related to post treatment/radiation changes.  A new 3.2 cm round low density lesion is identified in the left lower lobe.  This area was essentially obscured by the postradiation change on the previous study. The new finding is concerning for a recurrent lesion with central necrosis.  Bone  windows reveal no worrisome lytic or sclerotic osseous lesions.  IMPRESSION: 3.2 cm necrotic lesion in the central left lower lobe has imaging features consistent with recurrent neoplasm.  Stable borderline enlarged subcarinal lymph node.  Original Report Authenticated By: ERIC A. MANSELL, M.D.   Nm Pet Image Restag (ps) Skull Base To Thigh  10/31/2011  *RADIOLOGY REPORT*  Clinical Data: Subsequent treatment strategy for lung cancer. New left lung mass.  NUCLEAR MEDICINE PET SKULL BASE TO THIGH  Fasting Blood Glucose:  02/30/2018  Technique:  18.7 mCi F-18 FDG was injected intravenously. CT data was obtained and used for attenuation correction and anatomic localization only.  (This was not acquired as a diagnostic CT examination.) Additional exam technical data entered on technologist worksheet.  Comparison:  PET CT dated 08/23/2010.  CT chest dated 10/19/2011.  Findings:  Neck: No hypermetabolic lymph nodes in the neck.  Chest:  3.1 x 2.9 cm left lower lobe mass, corresponding to the necrotic lesion on prior CT chest, max SUV 6.6.  Radiation changes in the left paramediastinal region/lower lobe. Trace left pleural effusion/pleural thickening.  Underlying severe emphysematous changes.  No hypermetabolic mediastinal or hilar nodes.  Focal metabolism adjacent to the patient's right chest port within the SVC (PET image 92), max SUV 10.7, without convincing CT correlate to suggest recurrent tumor.  Abdomen/Pelvis:  No abnormal hypermetabolic activity within the liver, pancreas, adrenal glands, or spleen.  Pancreatic calcifications, likely sequela of prior/chronic pancreatitis.  No hypermetabolic lymph nodes in the abdomen or pelvis.  Extensive colonic diverticulosis.  Normal appendix.  Skeleton:  Degenerative changes.  No focal hypermetabolic activity to suggest skeletal metastasis.  IMPRESSION: 3.1 cm left lower lobe mass, max SUV 6.6, compatible with recurrent tumor.   Original Report Authenticated By: Charline Bills, M.D. ( 10/31/2011 09:47:14 )     ASSESSMENT/PLAN: This is a very pleasant 76 years old white male with history of stage IIIB/4 non-small cell lung cancer status post concurrent chemoradiation completed almost a year ago. The patient has some evidence for disease progression in the left lower lobe mass which is hypermetabolic on the PET scan. He is currently being treated with systemic chemotherapy in the form of carboplatin for an AUC of 5 gemcitabine at 1000 mg per meter square given on days 1 and 8 every 3 weeks status post 3 cycles. The CT of the chest abdomen and pelvis revealed mild enlargement of the left lower lobe necrotic nodule which was hypermetabolic on comparison PET CT scan. The remainder of the CT the chest was stable. CT of the abdomen and pelvis reveal no evidence of metastasis. There were some findings to suggest chronic pancreatitis as well as well as small duodenal diverticulum. The patient was discussed with Dr. Welton Flakes inDr. Mohamed's absence. Overall the CT scan revealed relatively stable disease. We will closely monitor the left lower lobe of necrotic nodule on future scans. He will proceed with day 8 of cycle #4 of his systemic chemotherapy with carboplatin for an AUC of 5 given on day 1 and gemcitabine 800 mg per meter squared given on days 1 and 8 every 3 weeks. He'll return in 2 weeks prior to the start of cycle #5 with repeat CBC differential and C. met.    [name], Steven Christopher E, PA-C   All questions were answered. The patient knows to call the clinic with any problems, questions or concerns. We can certainly see the patient much sooner if necessary.  I spent counseling the patient face to face. The total time spent in the appointment was 30 minutes.

## 2012-01-26 ENCOUNTER — Other Ambulatory Visit (HOSPITAL_BASED_OUTPATIENT_CLINIC_OR_DEPARTMENT_OTHER): Payer: Medicare Other | Admitting: Lab

## 2012-01-26 ENCOUNTER — Ambulatory Visit: Payer: Self-pay

## 2012-01-26 DIAGNOSIS — C3490 Malignant neoplasm of unspecified part of unspecified bronchus or lung: Secondary | ICD-10-CM

## 2012-01-26 LAB — CBC WITH DIFFERENTIAL/PLATELET
BASO%: 1.1 % (ref 0.0–2.0)
LYMPH%: 14.4 % (ref 14.0–49.0)
MCHC: 35 g/dL (ref 32.0–36.0)
MCV: 93.7 fL (ref 79.3–98.0)
MONO#: 0.3 10*3/uL (ref 0.1–0.9)
MONO%: 6.2 % (ref 0.0–14.0)
Platelets: 119 10*3/uL — ABNORMAL LOW (ref 140–400)
RBC: 3.2 10*6/uL — ABNORMAL LOW (ref 4.20–5.82)
WBC: 4.7 10*3/uL (ref 4.0–10.3)

## 2012-01-26 LAB — COMPREHENSIVE METABOLIC PANEL (CC13)
ALT: 36 U/L (ref 0–55)
Alkaline Phosphatase: 58 U/L (ref 40–150)
Sodium: 137 mEq/L (ref 136–145)
Total Bilirubin: 0.71 mg/dL (ref 0.20–1.20)
Total Protein: 6.9 g/dL (ref 6.4–8.3)

## 2012-02-02 ENCOUNTER — Ambulatory Visit (HOSPITAL_BASED_OUTPATIENT_CLINIC_OR_DEPARTMENT_OTHER): Payer: Medicare Other | Admitting: Physician Assistant

## 2012-02-02 ENCOUNTER — Ambulatory Visit (HOSPITAL_BASED_OUTPATIENT_CLINIC_OR_DEPARTMENT_OTHER): Payer: Medicare Other

## 2012-02-02 ENCOUNTER — Encounter: Payer: Self-pay | Admitting: Physician Assistant

## 2012-02-02 ENCOUNTER — Telehealth: Payer: Self-pay | Admitting: Internal Medicine

## 2012-02-02 ENCOUNTER — Other Ambulatory Visit (HOSPITAL_BASED_OUTPATIENT_CLINIC_OR_DEPARTMENT_OTHER): Payer: Medicare Other

## 2012-02-02 VITALS — BP 131/66 | HR 97 | Temp 96.9°F | Resp 18 | Ht 69.5 in | Wt 208.6 lb

## 2012-02-02 DIAGNOSIS — R0602 Shortness of breath: Secondary | ICD-10-CM

## 2012-02-02 DIAGNOSIS — C3490 Malignant neoplasm of unspecified part of unspecified bronchus or lung: Secondary | ICD-10-CM

## 2012-02-02 DIAGNOSIS — C3430 Malignant neoplasm of lower lobe, unspecified bronchus or lung: Secondary | ICD-10-CM

## 2012-02-02 DIAGNOSIS — Z5111 Encounter for antineoplastic chemotherapy: Secondary | ICD-10-CM

## 2012-02-02 LAB — CBC WITH DIFFERENTIAL/PLATELET
BASO%: 1.2 % (ref 0.0–2.0)
LYMPH%: 10.8 % — ABNORMAL LOW (ref 14.0–49.0)
MCHC: 32.9 g/dL (ref 32.0–36.0)
MCV: 95.8 fL (ref 79.3–98.0)
MONO%: 8.4 % (ref 0.0–14.0)
Platelets: 159 10*3/uL (ref 140–400)
RBC: 3.58 10*6/uL — ABNORMAL LOW (ref 4.20–5.82)
nRBC: 0 % (ref 0–0)

## 2012-02-02 LAB — COMPREHENSIVE METABOLIC PANEL (CC13)
ALT: 20 U/L (ref 0–55)
CO2: 26 mEq/L (ref 22–29)
Creatinine: 1.6 mg/dL — ABNORMAL HIGH (ref 0.7–1.3)
Total Bilirubin: 0.55 mg/dL (ref 0.20–1.20)

## 2012-02-02 MED ORDER — DEXAMETHASONE SODIUM PHOSPHATE 4 MG/ML IJ SOLN
20.0000 mg | Freq: Once | INTRAMUSCULAR | Status: AC
  Administered 2012-02-02: 20 mg via INTRAVENOUS

## 2012-02-02 MED ORDER — SODIUM CHLORIDE 0.9 % IV SOLN
Freq: Once | INTRAVENOUS | Status: AC
  Administered 2012-02-02: 12:00:00 via INTRAVENOUS

## 2012-02-02 MED ORDER — SODIUM CHLORIDE 0.9 % IJ SOLN
10.0000 mL | INTRAMUSCULAR | Status: DC | PRN
  Administered 2012-02-02: 10 mL
  Filled 2012-02-02: qty 10

## 2012-02-02 MED ORDER — SODIUM CHLORIDE 0.9 % IV SOLN
420.0000 mg | Freq: Once | INTRAVENOUS | Status: AC
  Administered 2012-02-02: 420 mg via INTRAVENOUS
  Filled 2012-02-02: qty 42

## 2012-02-02 MED ORDER — SODIUM CHLORIDE 0.9 % IV SOLN
800.0000 mg/m2 | Freq: Once | INTRAVENOUS | Status: AC
  Administered 2012-02-02: 1748 mg via INTRAVENOUS
  Filled 2012-02-02: qty 46

## 2012-02-02 MED ORDER — HEPARIN SOD (PORK) LOCK FLUSH 100 UNIT/ML IV SOLN
500.0000 [IU] | Freq: Once | INTRAVENOUS | Status: AC | PRN
  Administered 2012-02-02: 500 [IU]
  Filled 2012-02-02: qty 5

## 2012-02-02 MED ORDER — CARBOPLATIN CHEMO INTRADERMAL TEST DOSE 100MCG/0.02ML
100.0000 ug | Freq: Once | INTRADERMAL | Status: AC
  Administered 2012-02-02: 100 ug via INTRADERMAL
  Filled 2012-02-02: qty 0.01

## 2012-02-02 MED ORDER — ONDANSETRON 16 MG/50ML IVPB (CHCC)
16.0000 mg | Freq: Once | INTRAVENOUS | Status: AC
  Administered 2012-02-02: 16 mg via INTRAVENOUS

## 2012-02-02 NOTE — Progress Notes (Signed)
Zofran 16 mg and dexamethasone 20 mg given IV. Pre meds were just hung when pharmacy notified patient and nurse that he will need Carbo skin test starting today and before each subsequent carboplatin. Pharmacy put the order in for the carbo skin test and released it. Pre meds were stopped within 1 minute of hanging.  1300 Carboplatin skin test dose given.

## 2012-02-02 NOTE — Patient Instructions (Addendum)
Follow up in 3 weeks prior to the start of your next cycle of chemotherapy

## 2012-02-02 NOTE — Patient Instructions (Signed)
[facility] Discharge Instructions for Patients Receiving Chemotherapy  Today you received the following chemotherapy agents Gemzar/Carboplatin To help prevent nausea and vomiting after your treatment, we encourage you to take your nausea medication as prescribed.  If you develop nausea and vomiting that is not controlled by your nausea medication, call the clinic. If it is after clinic hours your family physician or the after hours number for the clinic or go to the Emergency Department.   BELOW ARE SYMPTOMS THAT SHOULD BE REPORTED IMMEDIATELY:  *FEVER GREATER THAN 100.5 F  *CHILLS WITH OR WITHOUT FEVER  NAUSEA AND VOMITING THAT IS NOT CONTROLLED WITH YOUR NAUSEA MEDICATION  *UNUSUAL SHORTNESS OF BREATH  *UNUSUAL BRUISING OR BLEEDING  TENDERNESS IN MOUTH AND THROAT WITH OR WITHOUT PRESENCE OF ULCERS  *URINARY PROBLEMS  *BOWEL PROBLEMS  UNUSUAL RASH Items with * indicate a potential emergency and should be followed up as soon as possible.  One of the nurses will contact you 24 hours after your treatment. Please let the nurse know about any problems that you may have experienced. Feel free to call the clinic you have any questions or concerns. The clinic phone number is [phone].   I have been informed and understand all the instructions given to me. I know to contact the clinic, my physician, or go to the Emergency Department if any problems should occur. I do not have any questions at this time, but understand that I may call the clinic during office hours   should I have any questions or need assistance in obtaining follow up care.    __________________________________________  _____________  __________ Signature of Patient or Authorized Representative            Date                   Time    __________________________________________ Nurse's Signature    

## 2012-02-02 NOTE — Telephone Encounter (Signed)
appts made and printed for pt pt aware that tx will follow on 12/12,email to mw

## 2012-02-02 NOTE — Progress Notes (Signed)
1330 Carbo skin test negative.  Premeds started-dhp, rn

## 2012-02-02 NOTE — Progress Notes (Signed)
1315-carbo test dose site on left forearm is  negative .

## 2012-02-07 NOTE — Progress Notes (Signed)
[facility] [phone]   [phone]  OFFICE PROGRESS NOTE  [name], MD [address] [place] [address]  PRINCIPAL DIAGNOSIS: Stage IIIB/IV non-small cell lung cancer diagnosed in June 2012.   PRIOR THERAPY: Status post a course of concurrent chemoradiation with weekly carboplatin and paclitaxel. Last dose of chemotherapy was given on November 01, 2010.   CURRENT THERAPY: Systemic chemotherapy with carboplatin for an AUC of 5 given on day 1 and gemcitabine 1000 mg per meter square given on days 1 and 8 every 3 weeks status post 1 cycle. However due to thrombocytopenia from cycle 2 forward he will proceed with carboplatin for an AUC of 5 given on day 1 and gemcitabine at 800 mg per meter squared given on days 1 and 8 every 3 weeks. Status post 3 cycles   INTERVAL HISTORY: [name] 76 y.o. male returns to the clinic today for a followup visit accompanied by his wife. He reports that he is been taken off Glucophage by his primary care physician. He continues to complain of shortness of breath with exertion and is interested in being referred to a pulmonologist. He voiced no other specific complaints today. Overall he is tolerating his systemic chemotherapy with carboplatin and gemcitabine relatively well. He had no problems with overt bleeding or bruising. He reports shortness of breath with exertion such as when walking up an incline or carrying things.He denied fever or chills. He voiced no other specific complaints today.   MEDICAL HISTORY: Past Medical History  Diagnosis Date  . Cancer   . Diabetes mellitus     ALLERGIES:  is allergic to codeine and penicillins.  MEDICATIONS:  Current Outpatient Prescriptions  Medication Sig Dispense Refill  . enalapril (VASOTEC) 5 MG tablet Take 5 mg by mouth daily.      Marland Kitchen glimepiride (AMARYL) 4 MG tablet Take 4 mg by mouth daily before breakfast. 2 tabs daily with breakfast      .  latanoprost (XALATAN) 0.005 % ophthalmic solution 1 drop at bedtime.      . lovastatin (MEVACOR) 20 MG tablet Take 20 mg by mouth at bedtime.      . pioglitazone (ACTOS) 30 MG tablet       . testosterone cypionate (DEPOTESTOTERONE CYPIONATE) 200 MG/ML injection Inject 2 mg as directed Once every 2 weeks.       No current facility-administered medications for this visit.   Facility-Administered Medications Ordered in Other Visits  Medication Dose Route Frequency Provider Last Rate Last Dose  . sodium chloride 0.9 % injection 10 mL  10 mL Intracatheter PRN Si Gaul, MD   10 mL at 11/24/11 1637    REVIEW OF SYSTEMS:  Pertinent items are noted in HPI.   PHYSICAL EXAMINATION: General appearance: alert, cooperative and no distress Head: Normocephalic, without obvious abnormality, atraumatic Neck: no adenopathy Lymph nodes: Cervical, supraclavicular, and axillary nodes normal. Resp: clear to auscultation bilaterally Cardio: regular rate and rhythm, S1, S2 normal, no murmur, click, rub or gallop GI: soft, non-tender; bowel sounds normal; no masses,  no organomegaly Extremities: extremities normal, atraumatic, no cyanosis or edema Neurologic: Alert and oriented X 3, normal strength and tone. Normal symmetric reflexes. Normal coordination and gait  ECOG PERFORMANCE STATUS: 1 - Symptomatic but completely ambulatory  Blood pressure 131/66, pulse 97, temperature 96.9 F (36.1 C), temperature source Oral, resp. rate 18, height 5' 9.5" (1.765 m), weight 208 lb 9.6 oz (94.62 kg).  LABORATORY  DATA: Lab Results  Component Value Date   WBC 7.2 02/02/2012   HGB 11.3* 02/02/2012   HCT 34.3* 02/02/2012   MCV 95.8 02/02/2012   PLT 159 02/02/2012      Chemistry      Component Value Date/Time   NA 137 02/02/2012 1028   NA 134 10/19/2011 0817   NA 138 12/09/2010 0531   K 4.5 02/02/2012 1028   K 4.5 10/19/2011 0817   K 4.0 12/09/2010 0531   CL 104 02/02/2012 1028   CL 99 10/19/2011 0817   CL  103 12/09/2010 0531   CO2 26 02/02/2012 1028   CO2 27 10/19/2011 0817   CO2 27 12/09/2010 0531   BUN 24.0 02/02/2012 1028   BUN 13 10/19/2011 0817   BUN 12 12/09/2010 0531   CREATININE 1.6* 02/02/2012 1028   CREATININE 1.4* 10/19/2011 0817   CREATININE 1.01 12/09/2010 0531      Component Value Date/Time   CALCIUM 9.4 02/02/2012 1028   CALCIUM 9.3 10/19/2011 0817   CALCIUM 9.6 12/09/2010 0531   ALKPHOS 61 02/02/2012 1028   ALKPHOS 48 10/19/2011 0817   ALKPHOS 73 12/08/2010 0600   AST 15 02/02/2012 1028   AST 19 10/19/2011 0817   AST 17 12/08/2010 0600   ALT 20 02/02/2012 1028   ALT 15 12/08/2010 0600   BILITOT 0.55 02/02/2012 1028   BILITOT 0.90 10/19/2011 0817   BILITOT 0.4 12/08/2010 0600       RADIOGRAPHIC STUDIES: Ct Chest W Contrast  10/19/2011  *RADIOLOGY REPORT*  Clinical Data: Lung cancer with shortness of breath.  CT CHEST WITH CONTRAST  Technique:  Multidetector CT imaging of the chest was performed following the standard protocol during bolus administration of intravenous contrast.  Contrast: 80mL OMNIPAQUE IOHEXOL 300 MG/ML  SOLN  Comparison: 07/14/2011  Findings: The right-sided Port-A-Cath tip is in the distal SVC.  No axillary, mediastinal, or hilar lymphadenopathy.  The 11 mm short-axis subcarinal lymph node is stable.  The heart size is normal. Coronary artery calcification is noted.  No pericardial effusion.  Lung windows again demonstrate central lobular emphysema. Irregular pleural thickening in the lower left hemithorax is stable and there is some persistent Volume loss and airspace opacification in the left perihilar region, presumably related to post treatment/radiation changes.  A new 3.2 cm round low density lesion is identified in the left lower lobe.  This area was essentially obscured by the postradiation change on the previous study. The new finding is concerning for a recurrent lesion with central necrosis.  Bone windows reveal no worrisome lytic or sclerotic osseous lesions.   IMPRESSION: 3.2 cm necrotic lesion in the central left lower lobe has imaging features consistent with recurrent neoplasm.  Stable borderline enlarged subcarinal lymph node.  Original Report Authenticated By: ERIC A. MANSELL, M.D.   Nm Pet Image Restag (ps) Skull Base To Thigh  10/31/2011  *RADIOLOGY REPORT*  Clinical Data: Subsequent treatment strategy for lung cancer. New left lung mass.  NUCLEAR MEDICINE PET SKULL BASE TO THIGH  Fasting Blood Glucose:  02/30/2018  Technique:  18.7 mCi F-18 FDG was injected intravenously. CT data was obtained and used for attenuation correction and anatomic localization only.  (This was not acquired as a diagnostic CT examination.) Additional exam technical data entered on technologist worksheet.  Comparison:  PET CT dated 08/23/2010.  CT chest dated 10/19/2011.  Findings:  Neck: No hypermetabolic lymph nodes in the neck.  Chest:  3.1 x 2.9 cm left lower lobe mass,  corresponding to the necrotic lesion on prior CT chest, max SUV 6.6.  Radiation changes in the left paramediastinal region/lower lobe. Trace left pleural effusion/pleural thickening.  Underlying severe emphysematous changes.  No hypermetabolic mediastinal or hilar nodes.  Focal metabolism adjacent to the patient's right chest port within the SVC (PET image 92), max SUV 10.7, without convincing CT correlate to suggest recurrent tumor.  Abdomen/Pelvis:  No abnormal hypermetabolic activity within the liver, pancreas, adrenal glands, or spleen.  Pancreatic calcifications, likely sequela of prior/chronic pancreatitis.  No hypermetabolic lymph nodes in the abdomen or pelvis.  Extensive colonic diverticulosis.  Normal appendix.  Skeleton:  Degenerative changes.  No focal hypermetabolic activity to suggest skeletal metastasis.  IMPRESSION: 3.1 cm left lower lobe mass, max SUV 6.6, compatible with recurrent tumor.   Original Report Authenticated By: Charline Bills, M.D. ( 10/31/2011 09:47:14 )     ASSESSMENT/PLAN: This  is a very pleasant 76 years old white male with history of stage IIIB/4 non-small cell lung cancer status post concurrent chemoradiation completed almost a year ago. The patient has some evidence for disease progression in the left lower lobe mass which is hypermetabolic on the PET scan. He is currently being treated with systemic chemotherapy in the form of carboplatin for an AUC of 5 gemcitabine at 1000 mg per meter square given on days 1 and 8 every 3 weeks status post 3 cycles. The CT of the chest abdomen and pelvis revealed mild enlargement of the left lower lobe necrotic nodule which was hypermetabolic on comparison PET CT scan. The remainder of the CT the chest was stable. CT of the abdomen and pelvis reveal no evidence of metastasis. There were some findings to suggest chronic pancreatitis as well as well as small duodenal diverticulum.  Overall the CT scan revealed relatively stable disease. We will closely monitor the left lower lobe of necrotic nodule on future scans. Patient was discussed with Dr. Arbutus Ped. He will continue with his systemic chemotherapy with carboplatin and gemcitabine and return in 3 weeks prior to the next scheduled cycle. We'll refer him to lobe our pulmonology for further evaluation and management of his shortness of breath with exertion.    Laural Benes, Majel Giel E, PA-C   All questions were answered. The patient knows to call the clinic with any problems, questions or concerns. We can certainly see the patient much sooner if necessary.  I spent counseling the patient face to face. The total time spent in the appointment was 30 minutes.

## 2012-02-08 ENCOUNTER — Other Ambulatory Visit (HOSPITAL_BASED_OUTPATIENT_CLINIC_OR_DEPARTMENT_OTHER): Payer: Medicare Other | Admitting: Lab

## 2012-02-08 ENCOUNTER — Ambulatory Visit (HOSPITAL_BASED_OUTPATIENT_CLINIC_OR_DEPARTMENT_OTHER): Payer: Medicare Other

## 2012-02-08 DIAGNOSIS — C3490 Malignant neoplasm of unspecified part of unspecified bronchus or lung: Secondary | ICD-10-CM

## 2012-02-08 DIAGNOSIS — Z5111 Encounter for antineoplastic chemotherapy: Secondary | ICD-10-CM

## 2012-02-08 DIAGNOSIS — C787 Secondary malignant neoplasm of liver and intrahepatic bile duct: Secondary | ICD-10-CM

## 2012-02-08 DIAGNOSIS — C3410 Malignant neoplasm of upper lobe, unspecified bronchus or lung: Secondary | ICD-10-CM

## 2012-02-08 LAB — CBC WITH DIFFERENTIAL/PLATELET
BASO%: 1.5 % (ref 0.0–2.0)
Basophils Absolute: 0.1 10*3/uL (ref 0.0–0.1)
Eosinophils Absolute: 0.1 10*3/uL (ref 0.0–0.5)
HCT: 31.9 % — ABNORMAL LOW (ref 38.4–49.9)
LYMPH%: 15.5 % (ref 14.0–49.0)
MCHC: 34.2 g/dL (ref 32.0–36.0)
MONO#: 0.1 10*3/uL (ref 0.1–0.9)
NEUT%: 78.5 % — ABNORMAL HIGH (ref 39.0–75.0)
Platelets: 176 10*3/uL (ref 140–400)
WBC: 4.7 10*3/uL (ref 4.0–10.3)

## 2012-02-08 LAB — COMPREHENSIVE METABOLIC PANEL (CC13)
BUN: 32 mg/dL — ABNORMAL HIGH (ref 7.0–26.0)
CO2: 25 mEq/L (ref 22–29)
Calcium: 9.6 mg/dL (ref 8.4–10.4)
Chloride: 101 mEq/L (ref 98–107)
Creatinine: 1.7 mg/dL — ABNORMAL HIGH (ref 0.7–1.3)
Glucose: 246 mg/dl — ABNORMAL HIGH (ref 70–99)

## 2012-02-08 MED ORDER — SODIUM CHLORIDE 0.9 % IV SOLN
Freq: Once | INTRAVENOUS | Status: AC
  Administered 2012-02-08: 13:00:00 via INTRAVENOUS

## 2012-02-08 MED ORDER — HEPARIN SOD (PORK) LOCK FLUSH 100 UNIT/ML IV SOLN
500.0000 [IU] | Freq: Once | INTRAVENOUS | Status: AC | PRN
  Administered 2012-02-08: 500 [IU]
  Filled 2012-02-08: qty 5

## 2012-02-08 MED ORDER — GEMCITABINE HCL CHEMO INJECTION 1 GM/26.3ML
800.0000 mg/m2 | Freq: Once | INTRAVENOUS | Status: AC
  Administered 2012-02-08: 1748 mg via INTRAVENOUS
  Filled 2012-02-08: qty 46

## 2012-02-08 MED ORDER — SODIUM CHLORIDE 0.9 % IJ SOLN
10.0000 mL | INTRAMUSCULAR | Status: DC | PRN
  Administered 2012-02-08: 10 mL
  Filled 2012-02-08: qty 10

## 2012-02-08 MED ORDER — DEXAMETHASONE SODIUM PHOSPHATE 10 MG/ML IJ SOLN
10.0000 mg | Freq: Once | INTRAMUSCULAR | Status: AC
  Administered 2012-02-08: 10 mg via INTRAVENOUS

## 2012-02-08 MED ORDER — ONDANSETRON 8 MG/50ML IVPB (CHCC)
8.0000 mg | Freq: Once | INTRAVENOUS | Status: AC
  Administered 2012-02-08: 8 mg via INTRAVENOUS

## 2012-02-08 NOTE — Patient Instructions (Signed)
[facility] Discharge Instructions for Patients Receiving Chemotherapy  Today you received the following chemotherapy agents Gemzar  To help prevent nausea and vomiting after your treatment, we encourage you to take your nausea medication as prescribed. If you develop nausea and vomiting that is not controlled by your nausea medication, call the clinic. If it is after clinic hours your family physician or the after hours number for the clinic or go to the Emergency Department.   BELOW ARE SYMPTOMS THAT SHOULD BE REPORTED IMMEDIATELY:  *FEVER GREATER THAN 100.5 F  *CHILLS WITH OR WITHOUT FEVER  NAUSEA AND VOMITING THAT IS NOT CONTROLLED WITH YOUR NAUSEA MEDICATION  *UNUSUAL SHORTNESS OF BREATH  *UNUSUAL BRUISING OR BLEEDING  TENDERNESS IN MOUTH AND THROAT WITH OR WITHOUT PRESENCE OF ULCERS  *URINARY PROBLEMS  *BOWEL PROBLEMS  UNUSUAL RASH Items with * indicate a potential emergency and should be followed up as soon as possible.  One of the nurses will contact you 24 hours after your treatment. Please let the nurse know about any problems that you may have experienced. Feel free to call the clinic you have any questions or concerns. The clinic phone number is [phone].   I have been informed and understand all the instructions given to me. I know to contact the clinic, my physician, or go to the Emergency Department if any problems should occur. I do not have any questions at this time, but understand that I may call the clinic during office hours   should I have any questions or need assistance in obtaining follow up care.    __________________________________________  _____________  __________ Signature of Patient or Authorized Representative            Date                   Time    __________________________________________ Nurse's Signature    

## 2012-02-15 ENCOUNTER — Other Ambulatory Visit (HOSPITAL_BASED_OUTPATIENT_CLINIC_OR_DEPARTMENT_OTHER): Payer: Medicare Other

## 2012-02-15 DIAGNOSIS — C3410 Malignant neoplasm of upper lobe, unspecified bronchus or lung: Secondary | ICD-10-CM

## 2012-02-15 DIAGNOSIS — C3490 Malignant neoplasm of unspecified part of unspecified bronchus or lung: Secondary | ICD-10-CM

## 2012-02-15 LAB — CBC WITH DIFFERENTIAL/PLATELET
Basophils Absolute: 0 10*3/uL (ref 0.0–0.1)
HCT: 25 % — ABNORMAL LOW (ref 38.4–49.9)
HGB: 8.7 g/dL — ABNORMAL LOW (ref 13.0–17.1)
MONO#: 0.1 10*3/uL (ref 0.1–0.9)
NEUT#: 2.8 10*3/uL (ref 1.5–6.5)
NEUT%: 81.4 % — ABNORMAL HIGH (ref 39.0–75.0)
RDW: 19.1 % — ABNORMAL HIGH (ref 11.0–14.6)
WBC: 3.4 10*3/uL — ABNORMAL LOW (ref 4.0–10.3)
lymph#: 0.5 10*3/uL — ABNORMAL LOW (ref 0.9–3.3)

## 2012-02-15 LAB — COMPREHENSIVE METABOLIC PANEL (CC13)
ALT: 21 U/L (ref 0–55)
Albumin: 3.6 g/dL (ref 3.5–5.0)
BUN: 48 mg/dL — ABNORMAL HIGH (ref 7.0–26.0)
CO2: 20 mEq/L — ABNORMAL LOW (ref 22–29)
Calcium: 9.1 mg/dL (ref 8.4–10.4)
Chloride: 99 mEq/L (ref 98–107)
Creatinine: 2.1 mg/dL — ABNORMAL HIGH (ref 0.7–1.3)
Potassium: 4.5 mEq/L (ref 3.5–5.1)

## 2012-02-15 NOTE — Progress Notes (Signed)
Quick Note:  Call patient with the result and arrange for IVF with 1 liter NS ______

## 2012-02-16 ENCOUNTER — Other Ambulatory Visit: Payer: Self-pay | Admitting: *Deleted

## 2012-02-16 ENCOUNTER — Telehealth: Payer: Self-pay | Admitting: *Deleted

## 2012-02-16 DIAGNOSIS — E860 Dehydration: Secondary | ICD-10-CM

## 2012-02-16 NOTE — Telephone Encounter (Signed)
Per Dr Donnald Garre, pt is to get 1 L IVF NS.  Pt's wife aware of appt tomorrow.  Pt is feeling slightly dizzy and weak.  Encouraged pt to increase his PO intake of fluids and minimize amount of caffeine.  Pt's wife states she feels he is not drinking as much fluids as he should, and he is also going to see his PCP today to be evaluated.  SLJ

## 2012-02-16 NOTE — Telephone Encounter (Signed)
Message copied by Caren Griffins on Thu Feb 16, 2012 10:38 AM ------      Message from: Si Gaul      Created: Wed Feb 15, 2012 10:52 PM       Call patient with the result and arrange for IVF with 1 liter NS

## 2012-02-17 ENCOUNTER — Ambulatory Visit (HOSPITAL_BASED_OUTPATIENT_CLINIC_OR_DEPARTMENT_OTHER): Payer: Medicare Other

## 2012-02-17 DIAGNOSIS — E860 Dehydration: Secondary | ICD-10-CM

## 2012-02-17 DIAGNOSIS — Z5111 Encounter for antineoplastic chemotherapy: Secondary | ICD-10-CM

## 2012-02-17 DIAGNOSIS — C3430 Malignant neoplasm of lower lobe, unspecified bronchus or lung: Secondary | ICD-10-CM

## 2012-02-17 MED ORDER — SODIUM CHLORIDE 0.9 % IV SOLN
Freq: Once | INTRAVENOUS | Status: AC
  Administered 2012-02-17: 16:00:00 via INTRAVENOUS

## 2012-02-17 NOTE — Patient Instructions (Addendum)
[facility] Discharge Instructions for Patients Receiving FLUIDS  Today you received the following chemotherapy agents 1000cc NS  To help prevent nausea and vomiting after your treatment, we encourage you to take your nausea medication and take it as often as prescribed   If you develop nausea and vomiting that is not controlled by your nausea medication, call the clinic. If it is after clinic hours your family physician or the after hours number for the clinic or go to the Emergency Department.   BELOW ARE SYMPTOMS THAT SHOULD BE REPORTED IMMEDIATELY:  *FEVER GREATER THAN 100.5 F  *CHILLS WITH OR WITHOUT FEVER  NAUSEA AND VOMITING THAT IS NOT CONTROLLED WITH YOUR NAUSEA MEDICATION  *UNUSUAL SHORTNESS OF BREATH  *UNUSUAL BRUISING OR BLEEDING  TENDERNESS IN MOUTH AND THROAT WITH OR WITHOUT PRESENCE OF ULCERS  *URINARY PROBLEMS  *BOWEL PROBLEMS  UNUSUAL RASH Items with * indicate a potential emergency and should be followed up as soon as possible.  One of the nurses will contact you 24 hours after your treatment. Please let the nurse know about any problems that you may have experienced. Feel free to call the clinic you have any questions or concerns. The clinic phone number is [phone].   I have been informed and understand all the instructions given to me. I know to contact the clinic, my physician, or go to the Emergency Department if any problems should occur. I do not have any questions at this time, but understand that I may call the clinic during office hours   should I have any questions or need assistance in obtaining follow up care.    __________________________________________  _____________  __________ Signature of Patient or Authorized Representative            Date                   Time    __________________________________________ Nurse's Signature

## 2012-02-17 NOTE — Progress Notes (Signed)
Pt in for fluids, chemo held per dr Arbutus Ped.  dmr

## 2012-02-20 ENCOUNTER — Other Ambulatory Visit: Payer: Self-pay | Admitting: Dermatology

## 2012-02-21 ENCOUNTER — Encounter: Payer: Self-pay | Admitting: Critical Care Medicine

## 2012-02-21 ENCOUNTER — Ambulatory Visit (INDEPENDENT_AMBULATORY_CARE_PROVIDER_SITE_OTHER): Payer: Medicare Other | Admitting: Critical Care Medicine

## 2012-02-21 VITALS — BP 120/58 | HR 119 | Temp 97.8°F | Ht 68.0 in | Wt 213.6 lb

## 2012-02-21 DIAGNOSIS — J449 Chronic obstructive pulmonary disease, unspecified: Secondary | ICD-10-CM | POA: Insufficient documentation

## 2012-02-21 DIAGNOSIS — J441 Chronic obstructive pulmonary disease with (acute) exacerbation: Secondary | ICD-10-CM

## 2012-02-21 DIAGNOSIS — R0602 Shortness of breath: Secondary | ICD-10-CM

## 2012-02-21 DIAGNOSIS — C3490 Malignant neoplasm of unspecified part of unspecified bronchus or lung: Secondary | ICD-10-CM

## 2012-02-21 MED ORDER — PREDNISONE 10 MG PO TABS: ORAL_TABLET | ORAL | Status: DC

## 2012-02-21 MED ORDER — TIOTROPIUM BROMIDE MONOHYDRATE 18 MCG IN CAPS: 18.0000 ug | ORAL_CAPSULE | Freq: Every day | RESPIRATORY_TRACT | Status: DC

## 2012-02-21 NOTE — Assessment & Plan Note (Signed)
History of non-small cell carcinoma lung status post recurrence and ongoing chemotherapy Prior history of radiation therapy and he will therapy for same Plan Per oncology Once the patient completes current cycle of chemotherapy may wish to consider referral to pulmonary rehabilitation

## 2012-02-21 NOTE — Progress Notes (Signed)
Subjective:    Patient ID: [name], male    DOB: Dec 24, 1935, 76 y.o.   MRN: [id number]  HPI Comments: Hx of Lung CA. First Dx 2012. Rx with CRx and XRT.  Pt cancer free for one year, then recurrence on CT Scan.  All left sided, 10/13.  Rx more CRx per [place].  Now on QOweek chemorx schedule Pt now getting more dyspneic.  Shortness of Breath The current episode started more than 1 month ago. The problem occurs daily (Pt notes exertional dyspnea, up incline or steps.  Not dyspneic at rest). The problem has been gradually worsening. Duration: If stops recovers in a few minutes. Associated symptoms include rhinorrhea, sputum production and wheezing. Pertinent negatives include no abdominal pain, chest pain, claudication, coryza, ear pain, fever, headaches, hemoptysis, leg pain, leg swelling, neck pain, orthopnea, PND, rash, sore throat, swollen glands, syncope or vomiting. The symptoms are aggravated by emotional upset, any activity, exercise and lying flat. Associated symptoms comments: Has a daily cough, intermittent all day. Risk factors include smoking. He has tried nothing for the symptoms. His past medical history is significant for chronic lung disease and pneumonia. There is no history of allergies, aspirin allergies, asthma, bronchiolitis, CAD, COPD, DVT, a heart failure, PE or a recent surgery. (Hx of scar in lungs)   Data from Oncology Dept: PRINCIPAL DIAGNOSIS: Stage IIIB/IV non-small cell lung cancer diagnosed in June 2012.  PRIOR THERAPY: Status post a course of concurrent chemoradiation with weekly carboplatin and paclitaxel. Last dose of chemotherapy was given on November 01, 2010.  CURRENT THERAPY: Systemic chemotherapy with carboplatin for an AUC of 5 given on day 1 and gemcitabine 1000 mg per meter square given on days 1 and 8 every 3 weeks status post 1 cycle. However due to thrombocytopenia from cycle 2 forward he will proceed with carboplatin for an AUC of 5 given on day 1 and  gemcitabine at 800 mg per meter squared given on days 1 and 8 every 3 weeks. Status post 3 cycles   Past Medical History  Diagnosis Date  . Lung cancer 2012    recurrence 12/2011  . Diabetes mellitus   . HTN (hypertension)   . High cholesterol      Family History  Problem Relation Age of Onset  . Heart disease Mother   . Heart disease Father   . Breast cancer Paternal Aunt      History   Social History  . Marital Status: Married    Spouse Name: N/A    Number of Children: N/A  . Years of Education: N/A   Occupational History  . retired    Social History Main Topics  . Smoking status: Former Smoker -- 4.0 packs/day for 34 years    Types: Cigarettes    Quit date: 03/15/1987  . Smokeless tobacco: Not on file  . Alcohol Use: No  . Drug Use: No  . Sexually Active: Not on file   Other Topics Concern  . Not on file   Social History Narrative  . No narrative on file     Allergies  Allergen Reactions  . Codeine Other (See Comments)    Made pt unrepsonsive  . Penicillins      Outpatient Prescriptions Prior to Visit  Medication Sig Dispense Refill  . enalapril (VASOTEC) 5 MG tablet Take 5 mg by mouth daily.      Marland Kitchen glimepiride (AMARYL) 4 MG tablet 2 tabs daily with breakfast      .  latanoprost (XALATAN) 0.005 % ophthalmic solution 1 drop at bedtime.      . lovastatin (MEVACOR) 20 MG tablet Take 20 mg by mouth at bedtime.      . pioglitazone (ACTOS) 30 MG tablet Take 30 mg by mouth daily.       Marland Kitchen testosterone cypionate (DEPOTESTOTERONE CYPIONATE) 200 MG/ML injection Inject 2 mg as directed Once every 2 weeks.       Facility-Administered Medications Prior to Visit  Medication Dose Route Frequency Provider Last Rate Last Dose  . sodium chloride 0.9 % injection 10 mL  10 mL Intracatheter PRN Si Gaul, MD   10 mL at 11/24/11 1637   Last reviewed on 02/21/2012  9:32 AM by Storm Frisk, MD    Review of Systems  Constitutional: Positive for chills and  fatigue. Negative for fever, diaphoresis, activity change, appetite change and unexpected weight change.  HENT: Positive for congestion and rhinorrhea. Negative for hearing loss, ear pain, nosebleeds, sore throat, facial swelling, sneezing, mouth sores, trouble swallowing, neck pain, neck stiffness, dental problem, voice change, postnasal drip, sinus pressure, tinnitus and ear discharge.   Eyes: Negative for photophobia, discharge, itching and visual disturbance.  Respiratory: Positive for cough, sputum production, shortness of breath and wheezing. Negative for apnea, hemoptysis, choking, chest tightness and stridor.   Cardiovascular: Negative for chest pain, palpitations, orthopnea, claudication, leg swelling, syncope and PND.  Gastrointestinal: Positive for nausea. Negative for vomiting, abdominal pain, constipation, blood in stool and abdominal distention.  Genitourinary: Negative for dysuria, urgency, frequency, hematuria, flank pain, decreased urine volume and difficulty urinating.  Musculoskeletal: Negative for myalgias, back pain, joint swelling, arthralgias and gait problem.  Skin: Negative for color change, pallor and rash.  Neurological: Positive for dizziness. Negative for tremors, seizures, syncope, speech difficulty, weakness, light-headedness, numbness and headaches.  Hematological: Negative for adenopathy. Does not bruise/bleed easily.  Psychiatric/Behavioral: Negative for confusion, sleep disturbance and agitation. The patient is not nervous/anxious.        Objective:   Physical Exam Filed Vitals:   02/21/12 0923  BP: 120/58  Pulse: 119  Temp: 97.8 F (36.6 C)  TempSrc: Oral  Height: 5\' 8"  (1.727 m)  Weight: 213 lb 9.6 oz (96.888 kg)  SpO2: 92%    Gen: Pleasant, well-nourished, in no distress,  normal affect  ENT: No lesions,  mouth clear,  oropharynx clear, no postnasal drip  Neck: No JVD, no TMG, no carotid bruits  Lungs: No use of accessory muscles, no dullness  to percussion, distant breath sounds  Cardiovascular: RRR, heart sounds normal, no murmur or gallops, no peripheral edema  Abdomen: soft and NT, no HSM,  BS normal  Musculoskeletal: No deformities, no cyanosis or clubbing  Neuro: alert, non focal  Skin: Warm, no lesions or rashes  No results found.  Spirometry 02/21/2012:   FEV1 58% predicted,     FEV1 to FVC ratio 51% predicted      Assessment & Plan:   Obstructive chronic bronchitis with exacerbation Golds C COPD Gold stage C. COPD with acute exacerbation Plan Prednisone  Take 4 tablets daily for 5 days then stop Spiriva daily  Return 6 weeks   Lung cancer History of non-small cell carcinoma lung status post recurrence and ongoing chemotherapy Prior history of radiation therapy and he will therapy for same Plan Per oncology Once the patient completes current cycle of chemotherapy may wish to consider referral to pulmonary rehabilitation   Updated Medication List Outpatient Encounter Prescriptions as of 02/21/2012  Medication  Sig Dispense Refill  . enalapril (VASOTEC) 5 MG tablet Take 5 mg by mouth daily.      Marland Kitchen glimepiride (AMARYL) 4 MG tablet 2 tabs daily with breakfast      . latanoprost (XALATAN) 0.005 % ophthalmic solution 1 drop at bedtime.      . lovastatin (MEVACOR) 20 MG tablet Take 20 mg by mouth at bedtime.      . NON FORMULARY Pt is currently receiving CHemotherapy      . pioglitazone (ACTOS) 30 MG tablet Take 30 mg by mouth daily.       . prochlorperazine (COMPAZINE) 10 MG tablet Take 10 mg by mouth every 6 (six) hours as needed.      . sitaGLIPtin (JANUVIA) 100 MG tablet Take 100 mg by mouth daily.      Marland Kitchen testosterone cypionate (DEPOTESTOTERONE CYPIONATE) 200 MG/ML injection Inject 2 mg as directed Once every 2 weeks.      . predniSONE (DELTASONE) 10 MG tablet Take 4 tablets daily for 5 days then stop  20 tablet  0  . tiotropium (SPIRIVA HANDIHALER) 18 MCG inhalation capsule Place 1 capsule (18 mcg  total) into inhaler and inhale daily.  30 capsule  6   Facility-Administered Encounter Medications as of 02/21/2012  Medication Dose Route Frequency Provider Last Rate Last Dose  . sodium chloride 0.9 % injection 10 mL  10 mL Intracatheter PRN Si Gaul, MD   10 mL at 11/24/11 1637

## 2012-02-21 NOTE — Assessment & Plan Note (Signed)
Gold stage C. COPD with acute exacerbation Plan Prednisone  Take 4 tablets daily for 5 days then stop Spiriva daily  Return 6 weeks

## 2012-02-21 NOTE — Patient Instructions (Addendum)
Prednisone  Take 4 tablets daily for 5 days then stop Spiriva daily  Return 6 weeks

## 2012-02-22 ENCOUNTER — Telehealth: Payer: Self-pay | Admitting: Medical Oncology

## 2012-02-22 DIAGNOSIS — C3490 Malignant neoplasm of unspecified part of unspecified bronchus or lung: Secondary | ICD-10-CM

## 2012-02-22 NOTE — Telephone Encounter (Signed)
Labs entered for tomorrow. 

## 2012-02-23 ENCOUNTER — Ambulatory Visit (HOSPITAL_BASED_OUTPATIENT_CLINIC_OR_DEPARTMENT_OTHER): Payer: Medicare Other | Admitting: Physician Assistant

## 2012-02-23 ENCOUNTER — Ambulatory Visit (HOSPITAL_COMMUNITY)
Admission: RE | Admit: 2012-02-23 | Discharge: 2012-02-23 | Disposition: A | Discharge status: Home / Self Care | Payer: Medicare Other | Source: Ambulatory Visit | Attending: Physician Assistant | Admitting: Physician Assistant

## 2012-02-23 ENCOUNTER — Ambulatory Visit (HOSPITAL_BASED_OUTPATIENT_CLINIC_OR_DEPARTMENT_OTHER): Payer: Medicare Other

## 2012-02-23 ENCOUNTER — Telehealth: Payer: Self-pay | Admitting: Internal Medicine

## 2012-02-23 ENCOUNTER — Other Ambulatory Visit (HOSPITAL_BASED_OUTPATIENT_CLINIC_OR_DEPARTMENT_OTHER): Payer: Medicare Other | Admitting: Lab

## 2012-02-23 VITALS — BP 135/66 | HR 100 | Temp 96.7°F | Resp 18 | Ht 68.0 in | Wt 215.9 lb

## 2012-02-23 DIAGNOSIS — C3490 Malignant neoplasm of unspecified part of unspecified bronchus or lung: Secondary | ICD-10-CM

## 2012-02-23 DIAGNOSIS — C3410 Malignant neoplasm of upper lobe, unspecified bronchus or lung: Secondary | ICD-10-CM

## 2012-02-23 DIAGNOSIS — Z5111 Encounter for antineoplastic chemotherapy: Secondary | ICD-10-CM

## 2012-02-23 DIAGNOSIS — C3430 Malignant neoplasm of lower lobe, unspecified bronchus or lung: Secondary | ICD-10-CM

## 2012-02-23 DIAGNOSIS — M25519 Pain in unspecified shoulder: Secondary | ICD-10-CM | POA: Insufficient documentation

## 2012-02-23 DIAGNOSIS — M79609 Pain in unspecified limb: Secondary | ICD-10-CM | POA: Insufficient documentation

## 2012-02-23 LAB — CBC WITH DIFFERENTIAL/PLATELET
Eosinophils Absolute: 0 10*3/uL (ref 0.0–0.5)
LYMPH%: 12.6 % — ABNORMAL LOW (ref 14.0–49.0)
MCV: 100.4 fL — ABNORMAL HIGH (ref 79.3–98.0)
MONO%: 17.5 % — ABNORMAL HIGH (ref 0.0–14.0)
NEUT#: 3.6 10*3/uL (ref 1.5–6.5)
Platelets: 102 10*3/uL — ABNORMAL LOW (ref 140–400)
RBC: 2.57 10*6/uL — ABNORMAL LOW (ref 4.20–5.82)
nRBC: 1 % — ABNORMAL HIGH (ref 0–0)

## 2012-02-23 LAB — COMPREHENSIVE METABOLIC PANEL (CC13)
AST: 12 U/L (ref 5–34)
Albumin: 3.5 g/dL (ref 3.5–5.0)
Alkaline Phosphatase: 56 U/L (ref 40–150)
BUN: 21 mg/dL (ref 7.0–26.0)
Potassium: 4.5 mEq/L (ref 3.5–5.1)
Sodium: 136 mEq/L (ref 136–145)

## 2012-02-23 MED ORDER — CARBOPLATIN CHEMO INTRADERMAL TEST DOSE 100MCG/0.02ML
100.0000 ug | Freq: Once | INTRADERMAL | Status: DC
  Filled 2012-02-23: qty 0.02

## 2012-02-23 MED ORDER — SODIUM CHLORIDE 0.9 % IJ SOLN
10.0000 mL | INTRAMUSCULAR | Status: DC | PRN
  Administered 2012-02-23: 10 mL
  Filled 2012-02-23: qty 10

## 2012-02-23 MED ORDER — ONDANSETRON 16 MG/50ML IVPB (CHCC)
16.0000 mg | Freq: Once | INTRAVENOUS | Status: AC
  Administered 2012-02-23: 16 mg via INTRAVENOUS

## 2012-02-23 MED ORDER — SODIUM CHLORIDE 0.9 % IV SOLN
336.0000 mg | Freq: Once | INTRAVENOUS | Status: AC
  Administered 2012-02-23: 340 mg via INTRAVENOUS
  Filled 2012-02-23: qty 34

## 2012-02-23 MED ORDER — HEPARIN SOD (PORK) LOCK FLUSH 100 UNIT/ML IV SOLN
500.0000 [IU] | Freq: Once | INTRAVENOUS | Status: AC | PRN
  Administered 2012-02-23: 500 [IU]
  Filled 2012-02-23: qty 5

## 2012-02-23 MED ORDER — SODIUM CHLORIDE 0.9 % IV SOLN
Freq: Once | INTRAVENOUS | Status: AC
  Administered 2012-02-23: 13:00:00 via INTRAVENOUS

## 2012-02-23 MED ORDER — SODIUM CHLORIDE 0.9 % IV SOLN
800.0000 mg/m2 | Freq: Once | INTRAVENOUS | Status: AC
  Administered 2012-02-23: 1748 mg via INTRAVENOUS
  Filled 2012-02-23: qty 46

## 2012-02-23 MED ORDER — DEXAMETHASONE SODIUM PHOSPHATE 4 MG/ML IJ SOLN
20.0000 mg | Freq: Once | INTRAMUSCULAR | Status: AC
  Administered 2012-02-23: 20 mg via INTRAVENOUS

## 2012-02-23 NOTE — Patient Instructions (Addendum)
Continue weekly labs  Follow up in 3 weeks prior to the start of your next cycle of chemotherapy

## 2012-02-23 NOTE — Telephone Encounter (Signed)
appts made and printed for pt [name] °

## 2012-02-26 NOTE — Progress Notes (Signed)
[facility] [phone]   [phone]  OFFICE PROGRESS NOTE  [name], MD [address] [place] [address]  PRINCIPAL DIAGNOSIS: Stage IIIB/IV non-small cell lung cancer diagnosed in June 2012.   PRIOR THERAPY: Status post a course of concurrent chemoradiation with weekly carboplatin and paclitaxel. Last dose of chemotherapy was given on November 01, 2010.   CURRENT THERAPY: Systemic chemotherapy with carboplatin for an AUC of 5 given on day 1 and gemcitabine 1000 mg per meter square given on days 1 and 8 every 3 weeks status post 1 cycle. However due to thrombocytopenia from cycle 2 forward he will proceed with carboplatin for an AUC of 5 given on day 1 and gemcitabine at 800 mg per meter squared given on days 1 and 8 every 3 weeks. Status post 4 cycles   INTERVAL HISTORY: Sohum T Wygant 76 y.o. male returns to the clinic today for a followup visit accompanied by his wife. His recently saw Dr. Delford Field who placed him on Spiriva and a prednisone taper. The patient states that Dr. Delford Field told him he would consider him for pulmonary rehabilitation program and non-cleft he finishes chemotherapy. He also recently was seen by dermatologist and had some lesions on his face "frozen". He complains of bilateral shoulder and upper arm pain. The pain can last for one to 2 hours and be described as negative. Ibuprofen helps he is to pain somewhat He continues to complain of shortness of breath with exertion. He voiced no other specific complaints today. Overall he is tolerating his systemic chemotherapy with carboplatin and gemcitabine relatively well. He had no problems with overt bleeding or bruising. He denied fever or chills.   MEDICAL HISTORY: Past Medical History  Diagnosis Date  . Lung cancer 2012    recurrence 12/2011  . Diabetes mellitus   . HTN (hypertension)   . High cholesterol     ALLERGIES:  is allergic to codeine and  penicillins.  MEDICATIONS:  Current Outpatient Prescriptions  Medication Sig Dispense Refill  . enalapril (VASOTEC) 5 MG tablet Take 5 mg by mouth daily.      Marland Kitchen glimepiride (AMARYL) 4 MG tablet 2 tabs daily with breakfast      . latanoprost (XALATAN) 0.005 % ophthalmic solution 1 drop at bedtime.      . lovastatin (MEVACOR) 20 MG tablet Take 20 mg by mouth at bedtime.      . NON FORMULARY Pt is currently receiving CHemotherapy      . pioglitazone (ACTOS) 30 MG tablet Take 30 mg by mouth daily.       . predniSONE (DELTASONE) 10 MG tablet Take 4 tablets daily for 5 days then stop  20 tablet  0  . prochlorperazine (COMPAZINE) 10 MG tablet Take 10 mg by mouth every 6 (six) hours as needed.      . sitaGLIPtin (JANUVIA) 100 MG tablet Take 100 mg by mouth daily.      Marland Kitchen testosterone cypionate (DEPOTESTOTERONE CYPIONATE) 200 MG/ML injection Inject 2 mg as directed Once every 2 weeks.      Marland Kitchen tiotropium (SPIRIVA HANDIHALER) 18 MCG inhalation capsule Place 1 capsule (18 mcg total) into inhaler and inhale daily.  30 capsule  6   No current facility-administered medications for this visit.   Facility-Administered Medications Ordered in Other Visits  Medication Dose Route Frequency Provider Last Rate Last Dose  . sodium chloride 0.9 % injection 10 mL  10 mL Intracatheter PRN Arbutus Ped  Mohamed, MD   10 mL at 11/24/11 1637    REVIEW OF SYSTEMS:  Pertinent items are noted in HPI.   PHYSICAL EXAMINATION: General appearance: alert, cooperative and no distress Head: Normocephalic, without obvious abnormality, atraumatic Neck: no adenopathy Lymph nodes: Cervical, supraclavicular, and axillary nodes normal. Resp: clear to auscultation bilaterally Cardio: regular rate and rhythm, S1, S2 normal, no murmur, click, rub or gallop GI: soft, non-tender; bowel sounds normal; no masses,  no organomegaly Extremities: extremities normal, atraumatic, no cyanosis or edema Neurologic: Alert and oriented X 3, normal  strength and tone. Normal symmetric reflexes. Normal coordination and gait  ECOG PERFORMANCE STATUS: 1 - Symptomatic but completely ambulatory  Blood pressure 135/66, pulse 100, temperature 96.7 F (35.9 C), temperature source Oral, resp. rate 18, height 5\' 8"  (1.727 m), weight 215 lb 14.4 oz (97.932 kg).  LABORATORY DATA: Lab Results  Component Value Date   WBC 5.3 02/23/2012   HGB 8.3* 02/23/2012   HCT 25.8* 02/23/2012   MCV 100.4* 02/23/2012   PLT 102* 02/23/2012      Chemistry      Component Value Date/Time   NA 136 02/23/2012 1048   NA 134 10/19/2011 0817   NA 138 12/09/2010 0531   K 4.5 02/23/2012 1048   K 4.5 10/19/2011 0817   K 4.0 12/09/2010 0531   CL 103 02/23/2012 1048   CL 99 10/19/2011 0817   CL 103 12/09/2010 0531   CO2 24 02/23/2012 1048   CO2 27 10/19/2011 0817   CO2 27 12/09/2010 0531   BUN 21.0 02/23/2012 1048   BUN 13 10/19/2011 0817   BUN 12 12/09/2010 0531   CREATININE 1.9* 02/23/2012 1048   CREATININE 1.4* 10/19/2011 0817   CREATININE 1.01 12/09/2010 0531      Component Value Date/Time   CALCIUM 8.5 02/23/2012 1048   CALCIUM 9.3 10/19/2011 0817   CALCIUM 9.6 12/09/2010 0531   ALKPHOS 56 02/23/2012 1048   ALKPHOS 48 10/19/2011 0817   ALKPHOS 73 12/08/2010 0600   AST 12 02/23/2012 1048   AST 19 10/19/2011 0817   AST 17 12/08/2010 0600   ALT 9 02/23/2012 1048   ALT 15 12/08/2010 0600   BILITOT 0.49 02/23/2012 1048   BILITOT 0.90 10/19/2011 0817   BILITOT 0.4 12/08/2010 0600       RADIOGRAPHIC STUDIES: Ct Chest W Contrast  10/19/2011  *RADIOLOGY REPORT*  Clinical Data: Lung cancer with shortness of breath.  CT CHEST WITH CONTRAST  Technique:  Multidetector CT imaging of the chest was performed following the standard protocol during bolus administration of intravenous contrast.  Contrast: 80mL OMNIPAQUE IOHEXOL 300 MG/ML  SOLN  Comparison: 07/14/2011  Findings: The right-sided Port-A-Cath tip is in the distal SVC.  No axillary, mediastinal, or hilar lymphadenopathy.  The  11 mm short-axis subcarinal lymph node is stable.  The heart size is normal. Coronary artery calcification is noted.  No pericardial effusion.  Lung windows again demonstrate central lobular emphysema. Irregular pleural thickening in the lower left hemithorax is stable and there is some persistent Volume loss and airspace opacification in the left perihilar region, presumably related to post treatment/radiation changes.  A new 3.2 cm round low density lesion is identified in the left lower lobe.  This area was essentially obscured by the postradiation change on the previous study. The new finding is concerning for a recurrent lesion with central necrosis.  Bone windows reveal no worrisome lytic or sclerotic osseous lesions.  IMPRESSION: 3.2 cm necrotic lesion in  the central left lower lobe has imaging features consistent with recurrent neoplasm.  Stable borderline enlarged subcarinal lymph node.  Original Report Authenticated By: ERIC A. MANSELL, M.D.   Nm Pet Image Restag (ps) Skull Base To Thigh  10/31/2011  *RADIOLOGY REPORT*  Clinical Data: Subsequent treatment strategy for lung cancer. New left lung mass.  NUCLEAR MEDICINE PET SKULL BASE TO THIGH  Fasting Blood Glucose:  02/30/2018  Technique:  18.7 mCi F-18 FDG was injected intravenously. CT data was obtained and used for attenuation correction and anatomic localization only.  (This was not acquired as a diagnostic CT examination.) Additional exam technical data entered on technologist worksheet.  Comparison:  PET CT dated 08/23/2010.  CT chest dated 10/19/2011.  Findings:  Neck: No hypermetabolic lymph nodes in the neck.  Chest:  3.1 x 2.9 cm left lower lobe mass, corresponding to the necrotic lesion on prior CT chest, max SUV 6.6.  Radiation changes in the left paramediastinal region/lower lobe. Trace left pleural effusion/pleural thickening.  Underlying severe emphysematous changes.  No hypermetabolic mediastinal or hilar nodes.  Focal metabolism  adjacent to the patient's right chest port within the SVC (PET image 92), max SUV 10.7, without convincing CT correlate to suggest recurrent tumor.  Abdomen/Pelvis:  No abnormal hypermetabolic activity within the liver, pancreas, adrenal glands, or spleen.  Pancreatic calcifications, likely sequela of prior/chronic pancreatitis.  No hypermetabolic lymph nodes in the abdomen or pelvis.  Extensive colonic diverticulosis.  Normal appendix.  Skeleton:  Degenerative changes.  No focal hypermetabolic activity to suggest skeletal metastasis.  IMPRESSION: 3.1 cm left lower lobe mass, max SUV 6.6, compatible with recurrent tumor.   Original Report Authenticated By: Charline Bills, M.D. ( 10/31/2011 09:47:14 )     ASSESSMENT/PLAN: This is a very pleasant 76 years old white male with history of stage IIIB/4 non-small cell lung cancer status post concurrent chemoradiation completed almost a year ago. The patient has some evidence for disease progression in the left lower lobe mass which is hypermetabolic on the PET scan. He is currently being treated with systemic chemotherapy in the form of carboplatin for an AUC of 5 gemcitabine at 1000 mg per meter square given on days 1 and 8 every 3 weeks status post 4 cycles. The CT of the chest abdomen and pelvis revealed mild enlargement of the left lower lobe necrotic nodule which was hypermetabolic on comparison PET CT scan. The remainder of the CT the chest was stable. CT of the abdomen and pelvis reveal no evidence of metastasis. There were some findings to suggest chronic pancreatitis as well as well as small duodenal diverticulum.  Overall the CT scan revealed relatively stable disease. Patient was discussed with Dr. Arbutus Ped. He'll proceed with cycle #5 of his systemic chemotherapy with carboplatin and gemcitabine. He'll continue with weekly labs consisting of a CBC differential and C. met. He'll return in 3 weeks prior to cycle #6 with a repeat CBC differential and C. met.    Laural Benes, Marielouise Amey E, PA-C   All questions were answered. The patient knows to call the clinic with any problems, questions or concerns. We can certainly see the patient much sooner if necessary.  I spent counseling the patient face to face. The total time spent in the appointment was 30 minutes.

## 2012-03-01 ENCOUNTER — Other Ambulatory Visit (HOSPITAL_BASED_OUTPATIENT_CLINIC_OR_DEPARTMENT_OTHER): Payer: Medicare Other | Admitting: Lab

## 2012-03-01 ENCOUNTER — Ambulatory Visit: Payer: Medicare Other

## 2012-03-01 DIAGNOSIS — C3410 Malignant neoplasm of upper lobe, unspecified bronchus or lung: Secondary | ICD-10-CM

## 2012-03-01 DIAGNOSIS — C3490 Malignant neoplasm of unspecified part of unspecified bronchus or lung: Secondary | ICD-10-CM

## 2012-03-01 LAB — CBC WITH DIFFERENTIAL/PLATELET
BASO%: 0.2 % (ref 0.0–2.0)
HCT: 24.6 % — ABNORMAL LOW (ref 38.4–49.9)
LYMPH%: 18.4 % (ref 14.0–49.0)
MCH: 32.5 pg (ref 27.2–33.4)
MCHC: 33.3 g/dL (ref 32.0–36.0)
MCV: 97.6 fL (ref 79.3–98.0)
MONO#: 0.2 10*3/uL (ref 0.1–0.9)
MONO%: 5.5 % (ref 0.0–14.0)
NEUT%: 74.8 % (ref 39.0–75.0)
Platelets: 84 10*3/uL — ABNORMAL LOW (ref 140–400)
WBC: 4.4 10*3/uL (ref 4.0–10.3)

## 2012-03-01 NOTE — Progress Notes (Signed)
1430-Pt.'s platelet count 84.  Tiana Loft PA notified and order received to hold Gemzar for today and to have patient return to [facility] on 03/08/12 for labs as ordered. Pt and pt.'s wife have have no questions at this time.

## 2012-03-08 ENCOUNTER — Other Ambulatory Visit (HOSPITAL_BASED_OUTPATIENT_CLINIC_OR_DEPARTMENT_OTHER): Payer: Medicare Other

## 2012-03-08 ENCOUNTER — Encounter: Payer: Self-pay | Admitting: Physician Assistant

## 2012-03-08 DIAGNOSIS — C3410 Malignant neoplasm of upper lobe, unspecified bronchus or lung: Secondary | ICD-10-CM

## 2012-03-08 DIAGNOSIS — C3490 Malignant neoplasm of unspecified part of unspecified bronchus or lung: Secondary | ICD-10-CM

## 2012-03-08 LAB — COMPREHENSIVE METABOLIC PANEL (CC13)
Albumin: 3.5 g/dL (ref 3.5–5.0)
BUN: 29 mg/dL — ABNORMAL HIGH (ref 7.0–26.0)
Calcium: 9.2 mg/dL (ref 8.4–10.4)
Chloride: 105 mEq/L (ref 98–107)
Creatinine: 2 mg/dL — ABNORMAL HIGH (ref 0.7–1.3)
Glucose: 144 mg/dl — ABNORMAL HIGH (ref 70–99)
Potassium: 5 mEq/L (ref 3.5–5.1)

## 2012-03-08 LAB — CBC WITH DIFFERENTIAL/PLATELET
Basophils Absolute: 0.1 10*3/uL (ref 0.0–0.1)
EOS%: 1.8 % (ref 0.0–7.0)
Eosinophils Absolute: 0.1 10*3/uL (ref 0.0–0.5)
HCT: 24.3 % — ABNORMAL LOW (ref 38.4–49.9)
HGB: 8.2 g/dL — ABNORMAL LOW (ref 13.0–17.1)
MCH: 35.1 pg — ABNORMAL HIGH (ref 27.2–33.4)
MCV: 104.2 fL — ABNORMAL HIGH (ref 79.3–98.0)
MONO%: 15.7 % — ABNORMAL HIGH (ref 0.0–14.0)
NEUT#: 3.3 10*3/uL (ref 1.5–6.5)
NEUT%: 70.6 % (ref 39.0–75.0)
RDW: 20.4 % — ABNORMAL HIGH (ref 11.0–14.6)
lymph#: 0.5 10*3/uL — ABNORMAL LOW (ref 0.9–3.3)

## 2012-03-08 NOTE — Progress Notes (Signed)
Left voice message for patient to call to discuss results of lab tests. When he returns call - his creatinine is elevated. He needs to push po fluids.  Laural Benes, Madasyn Heath E, PA-C

## 2012-03-09 NOTE — Progress Notes (Signed)
Pt's wife called back, informed her of what Tiana Loft wanted to inform pt regarding creatinine level being elevated.  She verbalized understanding and will have pt push po fluids.  SLJ

## 2012-03-15 ENCOUNTER — Other Ambulatory Visit (HOSPITAL_BASED_OUTPATIENT_CLINIC_OR_DEPARTMENT_OTHER): Payer: Self-pay | Admitting: Lab

## 2012-03-15 ENCOUNTER — Ambulatory Visit (HOSPITAL_BASED_OUTPATIENT_CLINIC_OR_DEPARTMENT_OTHER): Payer: Medicare Other | Admitting: Physician Assistant

## 2012-03-15 ENCOUNTER — Encounter: Payer: Self-pay | Admitting: Physician Assistant

## 2012-03-15 ENCOUNTER — Ambulatory Visit (HOSPITAL_BASED_OUTPATIENT_CLINIC_OR_DEPARTMENT_OTHER): Payer: Medicare Other

## 2012-03-15 ENCOUNTER — Telehealth: Payer: Self-pay | Admitting: *Deleted

## 2012-03-15 VITALS — BP 129/69 | HR 82 | Temp 96.9°F | Resp 18 | Ht 68.0 in | Wt 207.1 lb

## 2012-03-15 DIAGNOSIS — C3430 Malignant neoplasm of lower lobe, unspecified bronchus or lung: Secondary | ICD-10-CM

## 2012-03-15 DIAGNOSIS — C3490 Malignant neoplasm of unspecified part of unspecified bronchus or lung: Secondary | ICD-10-CM

## 2012-03-15 DIAGNOSIS — C3410 Malignant neoplasm of upper lobe, unspecified bronchus or lung: Secondary | ICD-10-CM

## 2012-03-15 DIAGNOSIS — Z5111 Encounter for antineoplastic chemotherapy: Secondary | ICD-10-CM

## 2012-03-15 LAB — COMPREHENSIVE METABOLIC PANEL (CC13)
Albumin: 3.4 g/dL — ABNORMAL LOW (ref 3.5–5.0)
Alkaline Phosphatase: 69 U/L (ref 40–150)
CO2: 21 mEq/L — ABNORMAL LOW (ref 22–29)
Calcium: 8.5 mg/dL (ref 8.4–10.4)
Chloride: 107 mEq/L (ref 98–107)
Glucose: 167 mg/dl — ABNORMAL HIGH (ref 70–99)
Potassium: 4.3 mEq/L (ref 3.5–5.1)
Sodium: 136 mEq/L (ref 136–145)
Total Protein: 7.2 g/dL (ref 6.4–8.3)

## 2012-03-15 LAB — CBC WITH DIFFERENTIAL/PLATELET
Basophils Absolute: 0.1 10*3/uL (ref 0.0–0.1)
Eosinophils Absolute: 0.3 10*3/uL (ref 0.0–0.5)
HCT: 29 % — ABNORMAL LOW (ref 38.4–49.9)
HGB: 9.4 g/dL — ABNORMAL LOW (ref 13.0–17.1)
MONO#: 0.6 10*3/uL (ref 0.1–0.9)
NEUT#: 3.9 10*3/uL (ref 1.5–6.5)
NEUT%: 65.8 % (ref 39.0–75.0)
RDW: 18.5 % — ABNORMAL HIGH (ref 11.0–14.6)
lymph#: 1.1 10*3/uL (ref 0.9–3.3)

## 2012-03-15 MED ORDER — DEXAMETHASONE SODIUM PHOSPHATE 10 MG/ML IJ SOLN
20.0000 mg | Freq: Once | INTRAMUSCULAR | Status: AC
  Administered 2012-03-15: 20 mg via INTRAVENOUS

## 2012-03-15 MED ORDER — ONDANSETRON 16 MG/50ML IVPB (CHCC)
16.0000 mg | Freq: Once | INTRAVENOUS | Status: AC
  Administered 2012-03-15: 16 mg via INTRAVENOUS

## 2012-03-15 MED ORDER — SODIUM CHLORIDE 0.9 % IV SOLN
Freq: Once | INTRAVENOUS | Status: AC
  Administered 2012-03-15: 13:00:00 via INTRAVENOUS

## 2012-03-15 MED ORDER — HEPARIN SOD (PORK) LOCK FLUSH 100 UNIT/ML IV SOLN
500.0000 [IU] | Freq: Once | INTRAVENOUS | Status: AC | PRN
  Administered 2012-03-15: 500 [IU]
  Filled 2012-03-15: qty 5

## 2012-03-15 MED ORDER — CARBOPLATIN CHEMO INTRADERMAL TEST DOSE 100MCG/0.02ML
100.0000 ug | Freq: Once | INTRADERMAL | Status: AC
  Administered 2012-03-15: 100 ug via INTRADERMAL
  Filled 2012-03-15: qty 0.01

## 2012-03-15 MED ORDER — SODIUM CHLORIDE 0.9 % IV SOLN
800.0000 mg/m2 | Freq: Once | INTRAVENOUS | Status: AC
  Administered 2012-03-15: 1748 mg via INTRAVENOUS
  Filled 2012-03-15: qty 46

## 2012-03-15 MED ORDER — SODIUM CHLORIDE 0.9 % IJ SOLN
10.0000 mL | INTRAMUSCULAR | Status: DC | PRN
  Administered 2012-03-15: 10 mL
  Filled 2012-03-15: qty 10

## 2012-03-15 MED ORDER — SODIUM CHLORIDE 0.9 % IV SOLN
346.5000 mg | Freq: Once | INTRAVENOUS | Status: AC
  Administered 2012-03-15: 350 mg via INTRAVENOUS
  Filled 2012-03-15: qty 35

## 2012-03-15 NOTE — Progress Notes (Signed)
Carbo skin test applied at  1405 in left anterior forearm. 1410  -   Negative. 1425  -   Negative. 1455  -   Negative.

## 2012-03-15 NOTE — Patient Instructions (Addendum)
Follow up with Dr. Arbutus Ped in 3 weeks with restaging CT scan of your chest to re-evaluate your disease

## 2012-03-15 NOTE — Patient Instructions (Addendum)
[facility] Discharge Instructions for Patients Receiving Chemotherapy  Today you received the following chemotherapy agents :  Gemzar,  Carboplatin.  To help prevent nausea and vomiting after your treatment, we encourage you to take your nausea medication as instructed by your physician.    If you develop nausea and vomiting that is not controlled by your nausea medication, call the clinic. If it is after clinic hours your family physician or the after hours number for the clinic or go to the Emergency Department.   BELOW ARE SYMPTOMS THAT SHOULD BE REPORTED IMMEDIATELY:  *FEVER GREATER THAN 100.5 F  *CHILLS WITH OR WITHOUT FEVER  NAUSEA AND VOMITING THAT IS NOT CONTROLLED WITH YOUR NAUSEA MEDICATION  *UNUSUAL SHORTNESS OF BREATH  *UNUSUAL BRUISING OR BLEEDING  TENDERNESS IN MOUTH AND THROAT WITH OR WITHOUT PRESENCE OF ULCERS  *URINARY PROBLEMS  *BOWEL PROBLEMS  UNUSUAL RASH Items with * indicate a potential emergency and should be followed up as soon as possible.  One of the nurses will contact you 24 hours after your treatment. Please let the nurse know about any problems that you may have experienced. Feel free to call the clinic you have any questions or concerns. The clinic phone number is [phone].   I have been informed and understand all the instructions given to me. I know to contact the clinic, my physician, or go to the Emergency Department if any problems should occur. I do not have any questions at this time, but understand that I may call the clinic during office hours   should I have any questions or need assistance in obtaining follow up care.    __________________________________________  _____________  __________ Signature of Patient or Authorized Representative            Date                   Time    __________________________________________ Nurse's Signature

## 2012-03-15 NOTE — Telephone Encounter (Signed)
Per staff message and POF I have scheduled appts.  JMW  

## 2012-03-17 NOTE — Progress Notes (Signed)
[facility] [phone]   Fax:([phone]  OFFICE PROGRESS NOTE  [name], MD [address] [place] [address]  PRINCIPAL DIAGNOSIS: Stage IIIB/IV non-small cell lung cancer diagnosed in June 2012.   PRIOR THERAPY: Status post a course of concurrent chemoradiation with weekly carboplatin and paclitaxel. Last dose of chemotherapy was given on November 01, 2010.   CURRENT THERAPY: Systemic chemotherapy with carboplatin for an AUC of 5 given on day 1 and gemcitabine 1000 mg per meter square given on days 1 and 8 every 3 weeks status post 1 cycle. However due to thrombocytopenia from cycle 2 forward he will proceed with carboplatin for an AUC of 5 given on day 1 and gemcitabine at 800 mg per meter squared given on days 1 and 8 every 3 weeks. Status post 5 cycles   INTERVAL HISTORY: [name] 77 y.o. male returns to the clinic today for a followup visit accompanied by his wife. He continues to complain of shortness of breath with exertion. He voiced no other specific complaints today. Overall he is tolerating his systemic chemotherapy with carboplatin and gemcitabine relatively well. He denies any problems with bleeding or bruising. He denied fever or chills.   MEDICAL HISTORY: Past Medical History  Diagnosis Date  . Lung cancer 2012    recurrence 12/2011  . Diabetes mellitus   . HTN (hypertension)   . High cholesterol     ALLERGIES:  is allergic to codeine and penicillins.  MEDICATIONS:  Current Outpatient Prescriptions  Medication Sig Dispense Refill  . enalapril (VASOTEC) 5 MG tablet Take 5 mg by mouth daily.      Marland Kitchen glimepiride (AMARYL) 4 MG tablet 2 tabs daily with breakfast      . latanoprost (XALATAN) 0.005 % ophthalmic solution 1 drop at bedtime.      . lovastatin (MEVACOR) 20 MG tablet Take 20 mg by mouth at bedtime.      . NON FORMULARY Pt is currently receiving CHemotherapy      . pioglitazone (ACTOS) 30 MG tablet  Take 30 mg by mouth daily.       . predniSONE (DELTASONE) 10 MG tablet Take 4 tablets daily for 5 days then stop  20 tablet  0  . prochlorperazine (COMPAZINE) 10 MG tablet Take 10 mg by mouth every 6 (six) hours as needed.      . sitaGLIPtin (JANUVIA) 100 MG tablet Take 100 mg by mouth daily.      Marland Kitchen tiotropium (SPIRIVA HANDIHALER) 18 MCG inhalation capsule Place 1 capsule (18 mcg total) into inhaler and inhale daily.  30 capsule  6   No current facility-administered medications for this visit.   Facility-Administered Medications Ordered in Other Visits  Medication Dose Route Frequency Provider Last Rate Last Dose  . sodium chloride 0.9 % injection 10 mL  10 mL Intracatheter PRN Si Gaul, MD   10 mL at 11/24/11 1637    REVIEW OF SYSTEMS:  A comprehensive review of systems was negative except for: Respiratory: positive for dyspnea on exertion   PHYSICAL EXAMINATION: General appearance: alert, cooperative and no distress Head: Normocephalic, without obvious abnormality, atraumatic Neck: no adenopathy Lymph nodes: Cervical, supraclavicular, and axillary nodes normal. Resp: clear to auscultation bilaterally Cardio: regular rate and rhythm, S1, S2 normal, no murmur, click, rub or gallop GI: soft, non-tender; bowel sounds normal; no masses,  no organomegaly Extremities: extremities normal, atraumatic, no cyanosis or edema Neurologic: Alert and oriented X 3,  normal strength and tone. Normal symmetric reflexes. Normal coordination and gait  ECOG PERFORMANCE STATUS: 1 - Symptomatic but completely ambulatory  Blood pressure 129/69, pulse 82, temperature 96.9 F (36.1 C), temperature source Oral, resp. rate 18, height 5\' 8"  (1.727 m), weight 207 lb 1.6 oz (93.94 kg).  LABORATORY DATA: Lab Results  Component Value Date   WBC 6.0 03/15/2012   HGB 9.4* 03/15/2012   HCT 29.0* 03/15/2012   MCV 101.4* 03/15/2012   PLT 235 03/15/2012      Chemistry      Component Value Date/Time   NA 136  03/15/2012 1058   NA 134 10/19/2011 0817   NA 138 12/09/2010 0531   K 4.3 03/15/2012 1058   K 4.5 10/19/2011 0817   K 4.0 12/09/2010 0531   CL 107 03/15/2012 1058   CL 99 10/19/2011 0817   CL 103 12/09/2010 0531   CO2 21* 03/15/2012 1058   CO2 27 10/19/2011 0817   CO2 27 12/09/2010 0531   BUN 27.0* 03/15/2012 1058   BUN 13 10/19/2011 0817   BUN 12 12/09/2010 0531   CREATININE 1.4* 03/15/2012 1058   CREATININE 1.4* 10/19/2011 0817   CREATININE 1.01 12/09/2010 0531      Component Value Date/Time   CALCIUM 8.5 03/15/2012 1058   CALCIUM 9.3 10/19/2011 0817   CALCIUM 9.6 12/09/2010 0531   ALKPHOS 69 03/15/2012 1058   ALKPHOS 48 10/19/2011 0817   ALKPHOS 73 12/08/2010 0600   AST 13 03/15/2012 1058   AST 19 10/19/2011 0817   AST 17 12/08/2010 0600   ALT 7 03/15/2012 1058   ALT 15 12/08/2010 0600   BILITOT 0.38 03/15/2012 1058   BILITOT 0.90 10/19/2011 0817   BILITOT 0.4 12/08/2010 0600       RADIOGRAPHIC STUDIES: Ct Chest W Contrast  10/19/2011  *RADIOLOGY REPORT*  Clinical Data: Lung cancer with shortness of breath.  CT CHEST WITH CONTRAST  Technique:  Multidetector CT imaging of the chest was performed following the standard protocol during bolus administration of intravenous contrast.  Contrast: 80mL OMNIPAQUE IOHEXOL 300 MG/ML  SOLN  Comparison: 07/14/2011  Findings: The right-sided Port-A-Cath tip is in the distal SVC.  No axillary, mediastinal, or hilar lymphadenopathy.  The 11 mm short-axis subcarinal lymph node is stable.  The heart size is normal. Coronary artery calcification is noted.  No pericardial effusion.  Lung windows again demonstrate central lobular emphysema. Irregular pleural thickening in the lower left hemithorax is stable and there is some persistent Volume loss and airspace opacification in the left perihilar region, presumably related to post treatment/radiation changes.  A new 3.2 cm round low density lesion is identified in the left lower lobe.  This area was essentially obscured by the postradiation change  on the previous study. The new finding is concerning for a recurrent lesion with central necrosis.  Bone windows reveal no worrisome lytic or sclerotic osseous lesions.  IMPRESSION: 3.2 cm necrotic lesion in the central left lower lobe has imaging features consistent with recurrent neoplasm.  Stable borderline enlarged subcarinal lymph node.  Original Report Authenticated By: ERIC A. MANSELL, M.D.   Nm Pet Image Restag (ps) Skull Base To Thigh  10/31/2011  *RADIOLOGY REPORT*  Clinical Data: Subsequent treatment strategy for lung cancer. New left lung mass.  NUCLEAR MEDICINE PET SKULL BASE TO THIGH  Fasting Blood Glucose:  02/30/2018  Technique:  18.7 mCi F-18 FDG was injected intravenously. CT data was obtained and used for attenuation correction and anatomic localization only.  (  This was not acquired as a diagnostic CT examination.) Additional exam technical data entered on technologist worksheet.  Comparison:  PET CT dated 08/23/2010.  CT chest dated 10/19/2011.  Findings:  Neck: No hypermetabolic lymph nodes in the neck.  Chest:  3.1 x 2.9 cm left lower lobe mass, corresponding to the necrotic lesion on prior CT chest, max SUV 6.6.  Radiation changes in the left paramediastinal region/lower lobe. Trace left pleural effusion/pleural thickening.  Underlying severe emphysematous changes.  No hypermetabolic mediastinal or hilar nodes.  Focal metabolism adjacent to the patient's right chest port within the SVC (PET image 92), max SUV 10.7, without convincing CT correlate to suggest recurrent tumor.  Abdomen/Pelvis:  No abnormal hypermetabolic activity within the liver, pancreas, adrenal glands, or spleen.  Pancreatic calcifications, likely sequela of prior/chronic pancreatitis.  No hypermetabolic lymph nodes in the abdomen or pelvis.  Extensive colonic diverticulosis.  Normal appendix.  Skeleton:  Degenerative changes.  No focal hypermetabolic activity to suggest skeletal metastasis.  IMPRESSION: 3.1 cm left lower  lobe mass, max SUV 6.6, compatible with recurrent tumor.   Original Report Authenticated By: Charline Bills, M.D. ( 10/31/2011 09:47:14 )     ASSESSMENT/PLAN: This is a very pleasant 77 years old white male with history of stage IIIB/4 non-small cell lung cancer status post concurrent chemoradiation completed almost a year ago. The patient has some evidence for disease progression in the left lower lobe mass which is hypermetabolic on the PET scan. He is currently being treated with systemic chemotherapy in the form of carboplatin for an AUC of 5 gemcitabine at 1000 mg per meter square given on days 1 and 8 every 3 weeks status post 4 cycles. The CT of the chest abdomen and pelvis revealed mild enlargement of the left lower lobe necrotic nodule which was hypermetabolic on comparison PET CT scan. The remainder of the CT the chest was stable. CT of the abdomen and pelvis reveal no evidence of metastasis. There were some findings to suggest chronic pancreatitis as well as well as small duodenal diverticulum.  Overall the last CT scan revealed relatively stable disease. Patient was discussed with Dr. Arbutus Ped. He'll proceed with cycle #6 of his systemic chemotherapy with carboplatin and gemcitabine. He'll continue with weekly labs consisting of a CBC differential and C. met. He'll follow up with Dr. Arbutus Ped in 3 weeks with a restaging CT scan of the chest with contrast to re-evaluate his disease.  Laural Benes, Neesha Langton E, PA-C   All questions were answered. The patient knows to call the clinic with any problems, questions or concerns. We can certainly see the patient much sooner if necessary.  I spent counseling the patient face to face. The total time spent in the appointment was 30 minutes.

## 2012-03-30 ENCOUNTER — Telehealth: Payer: Self-pay | Admitting: *Deleted

## 2012-03-30 ENCOUNTER — Ambulatory Visit (HOSPITAL_COMMUNITY)
Admission: RE | Admit: 2012-03-30 | Discharge: 2012-03-30 | Disposition: A | Discharge status: Home / Self Care | Payer: Medicare Other | Source: Ambulatory Visit | Attending: Physician Assistant | Admitting: Physician Assistant

## 2012-03-30 DIAGNOSIS — K8689 Other specified diseases of pancreas: Secondary | ICD-10-CM | POA: Insufficient documentation

## 2012-03-30 DIAGNOSIS — J90 Pleural effusion, not elsewhere classified: Secondary | ICD-10-CM | POA: Insufficient documentation

## 2012-03-30 DIAGNOSIS — C3490 Malignant neoplasm of unspecified part of unspecified bronchus or lung: Secondary | ICD-10-CM

## 2012-03-30 DIAGNOSIS — R222 Localized swelling, mass and lump, trunk: Secondary | ICD-10-CM | POA: Insufficient documentation

## 2012-03-30 MED ORDER — IOHEXOL 300 MG/ML  SOLN
80.0000 mL | Freq: Once | INTRAMUSCULAR | Status: AC | PRN
  Administered 2012-03-30: 80 mL via INTRAVENOUS

## 2012-03-30 NOTE — Telephone Encounter (Addendum)
RECEIVED A CALL AND A FAX CONCERNING PT.'S CT CHEST REPORT. THIS REPORT WAS GIVEN TO DR.MOHAMED.

## 2012-04-04 ENCOUNTER — Encounter: Payer: Self-pay | Admitting: Internal Medicine

## 2012-04-04 ENCOUNTER — Other Ambulatory Visit: Payer: Self-pay | Admitting: Medical Oncology

## 2012-04-04 ENCOUNTER — Ambulatory Visit (HOSPITAL_BASED_OUTPATIENT_CLINIC_OR_DEPARTMENT_OTHER): Payer: Medicare Other | Admitting: Internal Medicine

## 2012-04-04 ENCOUNTER — Telehealth: Payer: Self-pay | Admitting: Internal Medicine

## 2012-04-04 ENCOUNTER — Encounter: Payer: Self-pay | Admitting: Critical Care Medicine

## 2012-04-04 ENCOUNTER — Other Ambulatory Visit (HOSPITAL_BASED_OUTPATIENT_CLINIC_OR_DEPARTMENT_OTHER): Payer: Medicare Other | Admitting: Lab

## 2012-04-04 ENCOUNTER — Ambulatory Visit (INDEPENDENT_AMBULATORY_CARE_PROVIDER_SITE_OTHER): Payer: Medicare Other | Admitting: Critical Care Medicine

## 2012-04-04 VITALS — BP 127/59 | HR 92 | Temp 97.2°F | Resp 20 | Ht 71.0 in | Wt 204.0 lb

## 2012-04-04 VITALS — BP 102/50 | HR 89 | Temp 98.2°F | Ht 71.0 in | Wt 206.0 lb

## 2012-04-04 DIAGNOSIS — C3490 Malignant neoplasm of unspecified part of unspecified bronchus or lung: Secondary | ICD-10-CM

## 2012-04-04 DIAGNOSIS — C3430 Malignant neoplasm of lower lobe, unspecified bronchus or lung: Secondary | ICD-10-CM

## 2012-04-04 DIAGNOSIS — J441 Chronic obstructive pulmonary disease with (acute) exacerbation: Secondary | ICD-10-CM

## 2012-04-04 DIAGNOSIS — R112 Nausea with vomiting, unspecified: Secondary | ICD-10-CM

## 2012-04-04 LAB — CBC WITH DIFFERENTIAL/PLATELET
BASO%: 2 % (ref 0.0–2.0)
EOS%: 7 % (ref 0.0–7.0)
HCT: 29 % — ABNORMAL LOW (ref 38.4–49.9)
LYMPH%: 13.8 % — ABNORMAL LOW (ref 14.0–49.0)
MCH: 33.4 pg (ref 27.2–33.4)
MCHC: 33.6 g/dL (ref 32.0–36.0)
MCV: 99.4 fL — ABNORMAL HIGH (ref 79.3–98.0)
MONO#: 0.7 10*3/uL (ref 0.1–0.9)
MONO%: 11.4 % (ref 0.0–14.0)
NEUT%: 65.8 % (ref 39.0–75.0)
Platelets: 200 10*3/uL (ref 140–400)

## 2012-04-04 LAB — COMPREHENSIVE METABOLIC PANEL (CC13)
ALT: 11 U/L (ref 0–55)
Alkaline Phosphatase: 68 U/L (ref 40–150)
CO2: 24 mEq/L (ref 22–29)
Creatinine: 1.6 mg/dL — ABNORMAL HIGH (ref 0.7–1.3)
Total Bilirubin: 0.45 mg/dL (ref 0.20–1.20)

## 2012-04-04 MED ORDER — PROCHLORPERAZINE MALEATE 10 MG PO TABS: 10.0000 mg | ORAL_TABLET | Freq: Four times a day (QID) | ORAL | Status: DC | PRN

## 2012-04-04 NOTE — Progress Notes (Signed)
[facility] Telephone:(336) [phone]   Fax:([phone]  OFFICE PROGRESS NOTE  [name], MD [address] [place] [address]  PRINCIPAL DIAGNOSIS: Stage IIIB/IV non-small cell lung cancer diagnosed in June 2012.   PRIOR THERAPY: Status post a course of concurrent chemoradiation with weekly carboplatin and paclitaxel. Last dose of chemotherapy was given on November 01, 2010.   CURRENT THERAPY: Systemic chemotherapy with carboplatin for an AUC of 5 given on day 1 and gemcitabine 1000 mg per meter square given on days 1 and 8 every 3 weeks status post 1 cycle. However due to thrombocytopenia from cycle 2 forward he will proceed with carboplatin for an AUC of 5 given on day 1 and gemcitabine at 800 mg per meter squared given on days 1 and 8 every 3 weeks. Status post 6 cycles   INTERVAL HISTORY: Kolbe T Steel 77 y.o. male returns to the clinic today for followup visit accompanied his wife. The patient is feeling fine today with no specific complaints. He tolerated the last cycle of his chemotherapy fairly well with no significant adverse effects. He denied having any significant nausea or vomiting, no fever or chills. He denied having any significant weight loss or night sweats. The patient denied having any chest pain, shortness breath, cough or hemoptysis. He has mild peripheral neuropathy in the fingers and toes. He had repeat CT scan of the chest performed recently and he is here for evaluation and discussion of his scan results.  MEDICAL HISTORY: Past Medical History  Diagnosis Date  . Lung cancer 2012    recurrence 12/2011  . Diabetes mellitus   . HTN (hypertension)   . High cholesterol     ALLERGIES:  is allergic to codeine and penicillins.  MEDICATIONS:  Current Outpatient Prescriptions  Medication Sig Dispense Refill  . enalapril (VASOTEC) 5 MG tablet Take 5 mg by mouth daily.      Marland Kitchen glimepiride (AMARYL) 4 MG tablet 2 tabs daily with  breakfast      . latanoprost (XALATAN) 0.005 % ophthalmic solution 1 drop at bedtime.      . lovastatin (MEVACOR) 20 MG tablet Take 20 mg by mouth at bedtime.      . metFORMIN (GLUCOPHAGE) 1000 MG tablet Take 1 tablet by mouth daily.      . NON FORMULARY Pt is currently receiving CHemotherapy      . pioglitazone (ACTOS) 30 MG tablet Take 30 mg by mouth daily.       . sitaGLIPtin (JANUVIA) 100 MG tablet Take 100 mg by mouth daily.      Marland Kitchen tiotropium (SPIRIVA HANDIHALER) 18 MCG inhalation capsule Place 1 capsule (18 mcg total) into inhaler and inhale daily.  30 capsule  6  . prochlorperazine (COMPAZINE) 10 MG tablet Take 1 tablet (10 mg total) by mouth every 6 (six) hours as needed.  30 tablet  0   No current facility-administered medications for this visit.   Facility-Administered Medications Ordered in Other Visits  Medication Dose Route Frequency Provider Last Rate Last Dose  . sodium chloride 0.9 % injection 10 mL  10 mL Intracatheter PRN Si Gaul, MD   10 mL at 11/24/11 1637    SURGICAL HISTORY:  Past Surgical History  Procedure Date  . Tonsillectomy   . Gastrostomy tube placement 2011    has been removed  . Portacath placement 08/2010    REVIEW OF SYSTEMS:  A comprehensive review of systems was negative except for:  Constitutional: positive for fatigue Neurological: positive for paresthesia   PHYSICAL EXAMINATION: General appearance: alert, cooperative and no distress Head: Normocephalic, without obvious abnormality, atraumatic Neck: no adenopathy Lymph nodes: Cervical, supraclavicular, and axillary nodes normal. Resp: clear to auscultation bilaterally Cardio: regular rate and rhythm, S1, S2 normal, no murmur, click, rub or gallop GI: soft, non-tender; bowel sounds normal; no masses,  no organomegaly Extremities: extremities normal, atraumatic, no cyanosis or edema Neurologic: Alert and oriented X 3, normal strength and tone. Normal symmetric reflexes. Normal coordination  and gait  ECOG PERFORMANCE STATUS: 1 - Symptomatic but completely ambulatory  Blood pressure 127/59, pulse 92, temperature 97.2 F (36.2 C), temperature source Oral, resp. rate 20, height 5\' 11"  (1.803 m), weight 204 lb (92.534 kg).  LABORATORY DATA: Lab Results  Component Value Date   WBC 5.9 04/04/2012   HGB 9.8* 04/04/2012   HCT 29.0* 04/04/2012   MCV 99.4* 04/04/2012   PLT 200 04/04/2012      Chemistry      Component Value Date/Time   NA 138 04/04/2012 1433   NA 134 10/19/2011 0817   NA 138 12/09/2010 0531   K 4.5 04/04/2012 1433   K 4.5 10/19/2011 0817   K 4.0 12/09/2010 0531   CL 103 04/04/2012 1433   CL 99 10/19/2011 0817   CL 103 12/09/2010 0531   CO2 24 04/04/2012 1433   CO2 27 10/19/2011 0817   CO2 27 12/09/2010 0531   BUN 30.0* 04/04/2012 1433   BUN 13 10/19/2011 0817   BUN 12 12/09/2010 0531   CREATININE 1.6* 04/04/2012 1433   CREATININE 1.4* 10/19/2011 0817   CREATININE 1.01 12/09/2010 0531      Component Value Date/Time   CALCIUM 10.2 04/04/2012 1433   CALCIUM 9.3 10/19/2011 0817   CALCIUM 9.6 12/09/2010 0531   ALKPHOS 68 04/04/2012 1433   ALKPHOS 48 10/19/2011 0817   ALKPHOS 73 12/08/2010 0600   AST 15 04/04/2012 1433   AST 19 10/19/2011 0817   AST 17 12/08/2010 0600   ALT 11 04/04/2012 1433   ALT 15 12/08/2010 0600   BILITOT 0.45 04/04/2012 1433   BILITOT 0.90 10/19/2011 0817   BILITOT 0.4 12/08/2010 0600       RADIOGRAPHIC STUDIES: Ct Chest W Contrast  03/30/2012  *RADIOLOGY REPORT*  Clinical Data: History of lung cancer diagnosed with recurrence in September 2012.  Chemotherapy ongoing.  Radiation therapy completed.  CT CHEST WITH CONTRAST  Technique:  Multidetector CT imaging of the chest was performed following the standard protocol during bolus administration of intravenous contrast.  Contrast: 80mL OMNIPAQUE IOHEXOL 300 MG/ML  SOLN  Comparison:  Chest CT 01/17/2012.  Findings:  Mediastinum: Right internal jugular single lumen Port-A-Cath with tip terminating in the distal superior  vena cava. Heart size is normal. There is no significant pericardial fluid, thickening or pericardial calcification. There is atherosclerosis of the thoracic aorta, the great vessels of the mediastinum and the coronary arteries, including calcified atherosclerotic plaque in the left anterior descending coronary artery. No pathologically enlarged mediastinal or hilar lymph nodes. The esophagus is unremarkable in appearance.  Lungs/Pleura: Compared to the prior examination in the low attenuation mass in the left lower lobe has significantly increased in size, currently measuring 3.3 x 4.1 cm.  Chronic postradiation changes are again noted in the infrahilar aspect of the left lung, particularly in the left lower lobe where there is a chronic mass- like area of fibrosis with associated bronchiectasis.  Chronic left- sided pleural thickening and  small chronic left sided pleural effusion is unchanged compared to prior examinations.  There is a background of mild diffuse bronchial wall thickening with moderate centrilobular emphysema which is most pronounced in the lung apices.  A small amount of peripheral subpleural reticulation is again noted throughout the lungs bilaterally, particularly in the periphery of the right lower lobe and right middle lobe (unchanged).  Upper Abdomen: The gallbladder is nearly completely contracted, but otherwise unremarkable in appearance.  Numerous calcifications in the head of the pancreas are similar to the prior examination, likely sequelae of chronic pancreatitis.  The remainder the pancreas is otherwise atrophic.  Musculoskeletal: There are no aggressive appearing lytic or blastic lesions noted in the visualized portions of the skeleton.  IMPRESSION: 1.  Interval enlargement of a low attenuation mass in the left lower lobe.  Given that this mass was previously hypermetabolic PET CT 10/31/2011, this is highly concerning for local recurrence of disease, and this is clearly progressed  compared to the prior examination. 2.  Additional incidental findings, as above, similar to prior studies.  These results will be called to the ordering clinician or representative by the Radiologist Assistant, and communication documented in the PACS Dashboard.   Original Report Authenticated By: Trudie Reed, M.D.     ASSESSMENT: This is a very pleasant 77 years old white male with metastatic non-small cell lung cancer most recently treated with 6 cycles of systemic chemotherapy with carboplatin and gemcitabine. The patient tolerated his treatment fairly well but he has some evidence for interval enlargement of the mass in the left lower lobe concerning for disease progression.   PLAN: I discussed the scan results with the patient and his wife today. I recommended for him to see Dr. Kathrynn Running for consideration of palliative radiotherapy to the enlarging left lower lobe mass. I would see him back for followup visit in one month's for reevaluation and discussion of treatment options after radiation. The patient agreed to the current plan. I discussed the case with Dr. Kathrynn Running and he plans to see him in the next few days The patient was advised to call if he has any concerning symptoms in the interval.  All questions were answered. The patient knows to call the clinic with any problems, questions or concerns. We can certainly see the patient much sooner if necessary.  I spent 15 minutes counseling the patient face to face. The total time spent in the appointment was 25 minutes.

## 2012-04-04 NOTE — Telephone Encounter (Signed)
Gave pt appt Radiation Oncology with Dr. Kathrynn Running on 04/09/12 pm appt. Pt will see Dr. Arbutus Ped on February 2014

## 2012-04-04 NOTE — Assessment & Plan Note (Signed)
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

## 2012-04-04 NOTE — Patient Instructions (Signed)
Your scan showed some evidence for disease progression in the left lower lobe lung mass. I will refer you to Dr. Kathrynn Running for consideration of palliative radiotherapy to this area. Followup in one month.

## 2012-04-04 NOTE — Patient Instructions (Signed)
No change in medications. Return in         4 months 

## 2012-04-04 NOTE — Telephone Encounter (Signed)
Called in compazine refil to pharmacy -pt notified.

## 2012-04-04 NOTE — Progress Notes (Signed)
Subjective:    Patient ID: [name], male    DOB: [birth year], 77 y.o.   MRN: [id number]  HPI Data from Oncology Dept: PRINCIPAL DIAGNOSIS: Stage IIIB/IV non-small cell lung cancer diagnosed in June 2012.  PRIOR THERAPY: Status post a course of concurrent chemoradiation with weekly carboplatin and paclitaxel. Last dose of chemotherapy was given on November 01, 2010.  CURRENT THERAPY: Systemic chemotherapy with carboplatin for an AUC of 5 given on day 1 and gemcitabine 1000 mg per meter square given on days 1 and 8 every 3 weeks status post 1 cycle. However due to thrombocytopenia from cycle 2 forward he will proceed with carboplatin for an AUC of 5 given on day 1 and gemcitabine at 800 mg per meter squared given on days 1 and 8 every 3 weeks. Status post 3 cycles   04/04/2012 Pt still on chemorx.  Breathing is better. Not much cough. No real wheeze.  No edema in feet. No real nasal drainage.    Pt denies any significant sore throat, nasal congestion or excess secretions, fever, chills, sweats, unintended weight loss, pleurtic or exertional chest pain, orthopnea PND, or leg swelling Pt denies any increase in rescue therapy over baseline, denies waking up needing it or having any early am or nocturnal exacerbations of coughing/wheezing/or dyspnea. Pt also denies any obvious fluctuation in symptoms with  weather or environmental change or other alleviating or aggravating factors   Past Medical History  Diagnosis Date  . Lung cancer 2012    recurrence 12/2011  . Diabetes mellitus   . HTN (hypertension)   . High cholesterol      Family History  Problem Relation Age of Onset  . Heart disease Mother   . Heart disease Father   . Breast cancer Paternal Aunt      History   Social History  . Marital Status: Married    Spouse Name: N/A    Number of Children: N/A  . Years of Education: N/A   Occupational History  . retired    Social History Main Topics  . Smoking status: Former  Smoker -- 4.0 packs/day for 34 years    Types: Cigarettes    Quit date: 03/15/1987  . Smokeless tobacco: Not on file  . Alcohol Use: No  . Drug Use: No  . Sexually Active: Not on file   Other Topics Concern  . Not on file   Social History Narrative  . No narrative on file     Allergies  Allergen Reactions  . Codeine Other (See Comments)    Made pt unrepsonsive  . Penicillins      Outpatient Prescriptions Prior to Visit  Medication Sig Dispense Refill  . enalapril (VASOTEC) 5 MG tablet Take 5 mg by mouth daily.      Marland Kitchen glimepiride (AMARYL) 4 MG tablet 2 tabs daily with breakfast      . latanoprost (XALATAN) 0.005 % ophthalmic solution 1 drop at bedtime.      . lovastatin (MEVACOR) 20 MG tablet Take 20 mg by mouth at bedtime.      . NON FORMULARY Pt is currently receiving CHemotherapy      . pioglitazone (ACTOS) 30 MG tablet Take 30 mg by mouth daily.       . prochlorperazine (COMPAZINE) 10 MG tablet Take 10 mg by mouth every 6 (six) hours as needed.      . sitaGLIPtin (JANUVIA) 100 MG tablet Take 100 mg by mouth daily.      Marland Kitchen  tiotropium (SPIRIVA HANDIHALER) 18 MCG inhalation capsule Place 1 capsule (18 mcg total) into inhaler and inhale daily.  30 capsule  6  . [DISCONTINUED] predniSONE (DELTASONE) 10 MG tablet Take 4 tablets daily for 5 days then stop  20 tablet  0   Facility-Administered Medications Prior to Visit  Medication Dose Route Frequency Provider Last Rate Last Dose  . sodium chloride 0.9 % injection 10 mL  10 mL Intracatheter PRN Si Gaul, MD   10 mL at 11/24/11 1637  Last reviewed on 04/04/2012  2:12 PM by Storm Frisk, MD    Review of Systems  Constitutional: Positive for chills and fatigue. Negative for diaphoresis, activity change, appetite change and unexpected weight change.  HENT: Positive for congestion. Negative for hearing loss, nosebleeds, facial swelling, sneezing, mouth sores, trouble swallowing, neck stiffness, dental problem, voice  change, postnasal drip, sinus pressure, tinnitus and ear discharge.   Eyes: Negative for photophobia, discharge, itching and visual disturbance.  Respiratory: Positive for cough. Negative for apnea, choking, chest tightness and stridor.   Cardiovascular: Negative for palpitations.  Gastrointestinal: Positive for nausea. Negative for constipation, blood in stool and abdominal distention.  Genitourinary: Negative for dysuria, urgency, frequency, hematuria, flank pain, decreased urine volume and difficulty urinating.  Musculoskeletal: Negative for myalgias, back pain, joint swelling, arthralgias and gait problem.  Skin: Negative for color change and pallor.  Neurological: Positive for dizziness. Negative for tremors, seizures, syncope, speech difficulty, weakness, light-headedness and numbness.  Hematological: Negative for adenopathy. Does not bruise/bleed easily.  Psychiatric/Behavioral: Negative for confusion, sleep disturbance and agitation. The patient is not nervous/anxious.        Objective:   Physical Exam  Filed Vitals:   04/04/12 1402  BP: 102/50  Pulse: 89  Temp: 98.2 F (36.8 C)  TempSrc: Oral  Height: 5\' 11"  (1.803 m)  Weight: 206 lb (93.441 kg)  SpO2: 100%    Gen: Pleasant, well-nourished, in no distress,  normal affect  ENT: No lesions,  mouth clear,  oropharynx clear, no postnasal drip  Neck: No JVD, no TMG, no carotid bruits  Lungs: No use of accessory muscles, no dullness to percussion, distant breath sounds  Cardiovascular: RRR, heart sounds normal, no murmur or gallops, no peripheral edema  Abdomen: soft and NT, no HSM,  BS normal  Musculoskeletal: No deformities, no cyanosis or clubbing  Neuro: alert, non focal  Skin: Warm, no lesions or rashes        Assessment & Plan:   Obstructive chronic bronchitis with exacerbation Golds C COPD Gold stage C. COPD with chronic recurrent bronchitis stable at this time Plan Maintain Spiriva daily Return 6  months    Updated Medication List Outpatient Encounter Prescriptions as of 04/04/2012  Medication Sig Dispense Refill  . enalapril (VASOTEC) 5 MG tablet Take 5 mg by mouth daily.      Marland Kitchen glimepiride (AMARYL) 4 MG tablet 2 tabs daily with breakfast      . latanoprost (XALATAN) 0.005 % ophthalmic solution 1 drop at bedtime.      . lovastatin (MEVACOR) 20 MG tablet Take 20 mg by mouth at bedtime.      . metFORMIN (GLUCOPHAGE) 1000 MG tablet Take 1 tablet by mouth daily.      . NON FORMULARY Pt is currently receiving CHemotherapy      . pioglitazone (ACTOS) 30 MG tablet Take 30 mg by mouth daily.       . prochlorperazine (COMPAZINE) 10 MG tablet Take 10 mg by mouth every  6 (six) hours as needed.      . sitaGLIPtin (JANUVIA) 100 MG tablet Take 100 mg by mouth daily.      Marland Kitchen tiotropium (SPIRIVA HANDIHALER) 18 MCG inhalation capsule Place 1 capsule (18 mcg total) into inhaler and inhale daily.  30 capsule  6  . [DISCONTINUED] predniSONE (DELTASONE) 10 MG tablet Take 4 tablets daily for 5 days then stop  20 tablet  0   Facility-Administered Encounter Medications as of 04/04/2012  Medication Dose Route Frequency Provider Last Rate Last Dose  . sodium chloride 0.9 % injection 10 mL  10 mL Intracatheter PRN Si Gaul, MD   10 mL at 11/24/11 1637

## 2012-04-05 ENCOUNTER — Ambulatory Visit: Payer: Self-pay

## 2012-04-06 ENCOUNTER — Encounter: Payer: Self-pay | Admitting: Radiation Oncology

## 2012-04-06 ENCOUNTER — Telehealth: Payer: Self-pay | Admitting: Medical Oncology

## 2012-04-06 DIAGNOSIS — Z9221 Personal history of antineoplastic chemotherapy: Secondary | ICD-10-CM | POA: Insufficient documentation

## 2012-04-06 DIAGNOSIS — E7800 Pure hypercholesterolemia, unspecified: Secondary | ICD-10-CM | POA: Insufficient documentation

## 2012-04-06 DIAGNOSIS — Z923 Personal history of irradiation: Secondary | ICD-10-CM | POA: Insufficient documentation

## 2012-04-06 NOTE — Telephone Encounter (Signed)
Wife notified to tellpt to push fluids.

## 2012-04-06 NOTE — Telephone Encounter (Signed)
Message copied by Charma Igo on Fri Apr 06, 2012  4:32 PM ------      Message from: Conni Slipper      Created: Fri Apr 06, 2012  3:32 PM       Abnormal results, please call  Patient and encourage him to push po fluids

## 2012-04-09 ENCOUNTER — Ambulatory Visit
Admission: RE | Admit: 2012-04-09 | Discharge: 2012-04-09 | Disposition: A | Discharge status: Home / Self Care | Payer: Medicare Other | Source: Ambulatory Visit | Attending: Radiation Oncology | Admitting: Radiation Oncology

## 2012-04-09 ENCOUNTER — Encounter: Payer: Self-pay | Admitting: Radiation Oncology

## 2012-04-09 VITALS — BP 117/59 | HR 87 | Temp 97.6°F | Resp 22

## 2012-04-09 DIAGNOSIS — C3490 Malignant neoplasm of unspecified part of unspecified bronchus or lung: Secondary | ICD-10-CM

## 2012-04-09 DIAGNOSIS — R5381 Other malaise: Secondary | ICD-10-CM | POA: Insufficient documentation

## 2012-04-09 DIAGNOSIS — Z79899 Other long term (current) drug therapy: Secondary | ICD-10-CM | POA: Insufficient documentation

## 2012-04-09 DIAGNOSIS — R5383 Other fatigue: Secondary | ICD-10-CM | POA: Insufficient documentation

## 2012-04-09 DIAGNOSIS — C3430 Malignant neoplasm of lower lobe, unspecified bronchus or lung: Secondary | ICD-10-CM | POA: Insufficient documentation

## 2012-04-09 DIAGNOSIS — Z510 Encounter for antineoplastic radiation therapy: Secondary | ICD-10-CM | POA: Insufficient documentation

## 2012-04-09 DIAGNOSIS — R0602 Shortness of breath: Secondary | ICD-10-CM | POA: Insufficient documentation

## 2012-04-09 NOTE — Progress Notes (Signed)
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 Arbutus Ped.  He has enlargement and PET activity at the site of his treated left lower lung primary tumor.  His previous radiation to 66 Gy included the current growing tumor:                 He has been referred to discuss treatment options.   ALLERGIES:  is allergic to codeine and penicillins.  Meds: Current Outpatient Prescriptions  Medication Sig Dispense Refill  . enalapril (VASOTEC) 5 MG tablet Take 5 mg by mouth daily.      Marland Kitchen glimepiride (AMARYL) 4 MG tablet 2 tabs daily with breakfast      . latanoprost (XALATAN) 0.005 % ophthalmic solution Place 1 drop into both eyes at bedtime.       . lovastatin (MEVACOR) 20 MG tablet Take 20 mg by mouth at bedtime.      . pioglitazone (ACTOS) 30 MG tablet Take 30 mg by mouth daily.       . prochlorperazine (COMPAZINE) 10 MG tablet Take 1 tablet (10 mg total) by mouth every 6 (six) hours as needed.  30 tablet  0  . tiotropium (SPIRIVA HANDIHALER) 18 MCG inhalation capsule Place 1 capsule (18 mcg total) into inhaler and inhale daily.  30 capsule  6  . metFORMIN (GLUCOPHAGE) 1000 MG tablet Take 1 tablet by mouth daily.      . NON FORMULARY Pt is currently receiving CHemotherapy      . sitaGLIPtin (JANUVIA) 100 MG tablet Take 100 mg by mouth daily.       No current facility-administered medications for this encounter.   Facility-Administered  Medications Ordered in Other Encounters  Medication Dose Route Frequency Provider Last Rate Last Dose  . sodium chloride 0.9 % injection 10 mL  10 mL Intracatheter PRN Si Gaul, MD   10 mL at 11/24/11 1637    Physical Findings: The patient is in no acute distress. Patient is alert and oriented.  oral temperature is 97.6 F (36.4 C). His blood pressure is 117/59 and his pulse is 87. His respiration is 22 and oxygen saturation is 98%.   Respiratory effort unremarkable.  No significant changes.  Lab Findings: Lab Results  Component Value Date   WBC 5.9 04/04/2012   HGB 9.8* 04/04/2012   HCT 29.0* 04/04/2012   MCV 99.4* 04/04/2012   PLT 200 04/04/2012   Radiographic Findings: Ct Chest W Contrast  03/30/2012  *RADIOLOGY REPORT*  Clinical Data: History of lung cancer diagnosed with recurrence in September 2012.  Chemotherapy ongoing.  Radiation therapy completed.  CT CHEST WITH CONTRAST  Technique:  Multidetector CT imaging of the chest was performed following the standard protocol during bolus administration of intravenous contrast.  Contrast: 80mL OMNIPAQUE IOHEXOL 300 MG/ML  SOLN  Comparison: Chest CT 01/17/2012.  Findings:  Mediastinum: Right internal jugular single lumen Port-A-Cath with tip terminating in the distal superior vena  cava. Heart size is normal. There is no significant pericardial fluid, thickening or pericardial calcification. There is atherosclerosis of the thoracic aorta, the great vessels of the mediastinum and the coronary arteries, including calcified atherosclerotic plaque in the left anterior descending coronary artery. No pathologically enlarged mediastinal or hilar lymph nodes. The esophagus is unremarkable in appearance.  Lungs/Pleura: Compared to the prior examination in the low attenuation mass in the left lower lobe has significantly increased in size, currently measuring 3.3 x 4.1 cm.  Chronic postradiation changes are again noted in the infrahilar aspect of the left  lung, particularly in the left lower lobe where there is a chronic mass- like area of fibrosis with associated bronchiectasis.  Chronic left- sided pleural thickening and small chronic left sided pleural effusion is unchanged compared to prior examinations.  There is a background of mild diffuse bronchial wall thickening with moderate centrilobular emphysema which is most pronounced in the lung apices.  A small amount of peripheral subpleural reticulation is again noted throughout the lungs bilaterally, particularly in the periphery of the right lower lobe and right middle lobe (unchanged).  Upper Abdomen: The gallbladder is nearly completely contracted, but otherwise unremarkable in appearance.  Numerous calcifications in the head of the pancreas are similar to the prior examination, likely sequelae of chronic pancreatitis.  The remainder the pancreas is otherwise atrophic.  Musculoskeletal: There are no aggressive appearing lytic or blastic lesions noted in the visualized portions of the skeleton.  IMPRESSION: 1.  Interval enlargement of a low attenuation mass in the left lower lobe.  Given that this mass was previously hypermetabolic PET CT 10/31/2011, this is highly concerning for local recurrence of disease, and this is clearly progressed compared to the prior examination. 2.  Additional incidental findings, as above, similar to prior studies.  These results will be called to the ordering clinician or representative by the Radiologist Assistant, and communication documented in the PACS Dashboard.   Original Report Authenticated By: Trudie Reed, M.D.    Impression:  The patient has local recurrence, and may benefit from re-irradiation.  There are risks associated with re-irradiation which would need to be carefully accounted.    Plan:  Today, I talked to the patient and family about the findings and work-up thus far.  We discussed the natural history of disease and general treatment, highlighting the  role of reirradiation in the management.  We discussed the available radiation techniques, and focused on the details of logistics and delivery.  We reviewed the anticipated acute and late sequelae associated with re-irradiation in this setting.  The patient was encouraged to ask questions that I answered to the best of my ability.  I filled out a patient counseling form during our discussion including treatment diagrams.  We retained a copy for our records.  The patient is willing to accept the potential risks of re-irradiation and would like to proceed with re-irradiation and will be scheduled for CT simulation Friday 1/31.  I spent 30 minutes minutes face to face with the patient and more than 50% of that time was spent in counseling and/or coordination of care.   _____________________________________  Artist Pais. Kathrynn Running, M.D.

## 2012-04-09 NOTE — Progress Notes (Signed)
Recon, LLL lung ca stage IV  rad txs then 09/22/10-11/05/10 Alert,oriented x3, patient denys pain, nausea, coughs once in a while pheglm mostly clear, yellow tinge at times, ,appetite good, drinking gator ade more  S/p chemotherapy 2 weeks now,  Her for possible palliative radiation enlarging  left lower mass

## 2012-04-13 ENCOUNTER — Encounter: Payer: Self-pay | Admitting: Radiation Oncology

## 2012-04-13 ENCOUNTER — Ambulatory Visit
Admission: RE | Admit: 2012-04-13 | Discharge: 2012-04-13 | Disposition: A | Discharge status: Home / Self Care | Payer: Medicare Other | Source: Ambulatory Visit | Attending: Radiation Oncology | Admitting: Radiation Oncology

## 2012-04-13 ENCOUNTER — Telehealth: Payer: Self-pay | Admitting: Radiation Oncology

## 2012-04-13 DIAGNOSIS — C3490 Malignant neoplasm of unspecified part of unspecified bronchus or lung: Secondary | ICD-10-CM

## 2012-04-13 DIAGNOSIS — Z923 Personal history of irradiation: Secondary | ICD-10-CM

## 2012-04-13 NOTE — Progress Notes (Signed)
  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  Identity was confirmed.  All relevant records and images related to the planned course of therapy were reviewed.  The patient freely provided informed written consent to proceed with treatment after reviewing the details related to the planned course of therapy. The consent form was witnessed and verified by the simulation staff.  Then, the patient was set-up in a stable reproducible  supine position for radiation therapy.  CT images were obtained.  Surface markings were placed.  The CT images were loaded into the planning software.  Then the target and avoidance structures were contoured.  Treatment planning then occurred.  The radiation prescription was entered and confirmed.  Then, I designed and supervised the construction of a total of one medically necessary complex treatment device in the form of a vacuum LOC pillow position her for arm immobilization and reproducible setup.  I have requested : Intensity Modulated Radiotherapy (IMRT) is medically necessary for this case for the following reason:  Previous treatment to this area..  I have ordered:Nutrition Consult  RESPIRATORY MOTION MANAGEMENT SIMULATION:  In order to account for effect of respiratory motion on target structures and other organs in the planning and delivery of radiotherapy, this patient underwent respiratory motion management simulation.  To accomplish this, when the patient was brought to the CT  simulation planning suite, 4D respiratoy motion management CT images were obtained.  The CT images were loaded into the planning software.  Then, using a variety of tools including Cine, MIP, and standard views, the target volume and planning target volumes (PTV) were delineated.  Avoidance structures were contoured.  Treatment planning will occurr.  Dose volume histograms will be generated and reviewed for each of the requested structure.  SPECIAL TREATMENT PROCEDURE:  The planned course of therapy using radiation constitutes a special treatment procedure. Special care is required in the management of this patient for the following reasons.  This treatment constitutes a Special Treatment Procedure for the following reason: [ Retreatment in a previously radiated area requiring careful monitoring of increased risk of toxicity due to overlap of previous treatment.  Initially, this will require careful sparing of retreated organs, and perhaps multiple iterations to achieve an acceptable radiation plan following composite accumulative radiation evaluation incorporating the patient's previous treatment and current treatment. He'll also require close clinical monitoring for the potential of severe toxicity to pulmonary vessels and airways.  PLAN:  The patient will receive 35 Gy in 14 fractions for a total cumulative nominal dose of 101 Gy.  ________________________________  Artist Pais. Kathrynn Running, M.D.

## 2012-04-13 NOTE — Telephone Encounter (Signed)
Met w patient to discuss RO billing. Pt has no financial concerns today. However, advised he has a pymt plan already in process w [facility] Patient Billing and he will add this bal, once Rad Onc billing is complete and final.   Dx:  Lung cancer - Primary 162.9  Attending Rad:  MM   Rad Tx:  IMRT x20

## 2012-04-16 ENCOUNTER — Encounter: Payer: Self-pay | Admitting: Radiation Oncology

## 2012-04-16 NOTE — Progress Notes (Signed)
  Radiation Oncology         [phone] ________________________________  Name: [name] MRN: [id number]  Date: 04/16/2012  DOB: 06-Jan-1936  IMRT PLANNING NOTE  NARRATIVE:  Using the CT images that were loaded into the planning software, an inverse optimized plan was generated.  Target and avoidance structures were contoured and assigned goal dose parameters with priorities.  Treatment planning then occurred.  I reviewed the plan today.  It meets my goals and institutional parameters for approval.  Intensity Modulated Radiotherapy (IMRT) is medically necessary for this case for the following reason:  Previous treatment to this area.  I supervised the design and construction of an IMRT treatment device.  I supervised and approved one dose calculation.  RESPIRATORY MOTION MANAGEMENT SIMULATION:  In order to account for effect of respiratory motion on target structures and other organs in the planning and delivery of radiotherapy, this patient underwent respiratory motion management simulation.  To accomplish this, when the patient was brought to the CT simulation planning suite, 4D respiratoy motion management CT images were obtained.  The CT images were loaded into the planning software.  Then, using a variety of tools including Cine, MIP, and standard views, the target volume and planning target volumes (PTV) were delineated.  Avoidance structures were contoured.  Treatment planning then occurred.  Dose volume histograms were generated and reviewed for each of the requested structure.  The resulting plan was carefully reviewed and approved today.  ________________________________  Artist Pais. Kathrynn Running, M.D.

## 2012-04-24 ENCOUNTER — Ambulatory Visit
Admission: RE | Admit: 2012-04-24 | Discharge: 2012-04-24 | Disposition: A | Discharge status: Home / Self Care | Payer: Medicare Other | Source: Ambulatory Visit | Attending: Radiation Oncology | Admitting: Radiation Oncology

## 2012-04-25 ENCOUNTER — Ambulatory Visit
Admission: RE | Admit: 2012-04-25 | Discharge: 2012-04-25 | Disposition: A | Discharge status: Home / Self Care | Payer: Medicare Other | Source: Ambulatory Visit | Attending: Radiation Oncology | Admitting: Radiation Oncology

## 2012-04-26 ENCOUNTER — Ambulatory Visit: Payer: Medicare Other

## 2012-04-26 ENCOUNTER — Ambulatory Visit: Admission: RE | Admit: 2012-04-26 | Payer: Medicare Other | Source: Ambulatory Visit | Admitting: Radiation Oncology

## 2012-04-27 ENCOUNTER — Ambulatory Visit: Payer: Medicare Other

## 2012-04-30 ENCOUNTER — Ambulatory Visit
Admission: RE | Admit: 2012-04-30 | Discharge: 2012-04-30 | Disposition: A | Discharge status: Home / Self Care | Payer: Medicare Other | Source: Ambulatory Visit | Attending: Radiation Oncology | Admitting: Radiation Oncology

## 2012-05-01 ENCOUNTER — Ambulatory Visit
Admission: RE | Admit: 2012-05-01 | Discharge: 2012-05-01 | Disposition: A | Discharge status: Home / Self Care | Payer: Medicare Other | Source: Ambulatory Visit | Attending: Radiation Oncology | Admitting: Radiation Oncology

## 2012-05-02 ENCOUNTER — Ambulatory Visit
Admission: RE | Admit: 2012-05-02 | Discharge: 2012-05-02 | Disposition: A | Discharge status: Home / Self Care | Payer: Medicare Other | Source: Ambulatory Visit | Attending: Radiation Oncology | Admitting: Radiation Oncology

## 2012-05-02 ENCOUNTER — Encounter: Payer: Self-pay | Admitting: Radiation Oncology

## 2012-05-02 VITALS — BP 109/71 | HR 90 | Temp 97.5°F | Resp 20 | Ht 70.5 in | Wt 199.2 lb

## 2012-05-02 NOTE — Progress Notes (Signed)
Patient  Here rad tx chest 5 completed , vitals wnl, room air sat 100% ,alertoriented x3. No c/o pain , eating and drinking well, occasionally clear phelgm, not on chemo any more stated patient, Patient Education done, "radiation therapy and you book given, and my business card, can call for any questions /concerns, patient treated 17 months ago , reviewed signs/symptoms to report, sees MD weekly and prn  12:13 PM

## 2012-05-02 NOTE — Progress Notes (Signed)
  Radiation Oncology         [phone] ________________________________  Name: [name] MRN: [id number]  Date: 05/02/2012  DOB: 02-28-1936  Weekly Radiation Therapy Management  Current Dose: 12.5 Gy     Planned Dose:  35 Gy  Narrative . . . . . . . . The patient presents for routine under treatment assessment.                                                      The patient is without complaint.                                 Set-up films were reviewed.                                 The chart was checked. Physical Findings. . .  height is 5' 10.5" (1.791 m) and weight is 199 lb 3.2 oz (90.357 kg). His oral temperature is 97.5 F (36.4 C). His blood pressure is 109/71 and his pulse is 90. His respiration is 20. . Weight essentially stable.  No significant changes. Impression . . . . . . . The patient is  tolerating radiation. Plan . . . . . . . . . . . . Continue treatment as planned.  ________________________________  Artist Pais. Kathrynn Running, M.D.

## 2012-05-03 ENCOUNTER — Ambulatory Visit
Admission: RE | Admit: 2012-05-03 | Discharge: 2012-05-03 | Disposition: A | Discharge status: Home / Self Care | Payer: Medicare Other | Source: Ambulatory Visit | Attending: Radiation Oncology | Admitting: Radiation Oncology

## 2012-05-04 ENCOUNTER — Ambulatory Visit
Admission: RE | Admit: 2012-05-04 | Discharge: 2012-05-04 | Disposition: A | Discharge status: Home / Self Care | Payer: Medicare Other | Source: Ambulatory Visit | Attending: Radiation Oncology | Admitting: Radiation Oncology

## 2012-05-04 ENCOUNTER — Encounter: Payer: Self-pay | Admitting: Radiation Oncology

## 2012-05-04 ENCOUNTER — Ambulatory Visit: Admission: RE | Admit: 2012-05-04 | Payer: Medicare Other | Source: Ambulatory Visit

## 2012-05-04 VITALS — BP 127/56 | HR 94 | Temp 97.6°F | Wt 199.3 lb

## 2012-05-04 NOTE — Progress Notes (Signed)
  Radiation Oncology         [phone] ________________________________  Name: [name] MRN: [id number]  Date: 05/04/2012  DOB: Jan 30, 1936  Weekly Radiation Therapy Management  Current Dose: 17.5 Gy     Planned Dose:  35 Gy  Narrative . . . . . . . . The patient presents for routine under treatment assessment.                                                     The patient is without complaint.                                 Set-up films were reviewed.                                 The chart was checked. Physical Findings. . .  weight is 199 lb 4.8 oz (90.402 kg). His temperature is 97.6 F (36.4 C). His blood pressure is 127/56 and his pulse is 94. . Weight essentially stable.  No significant changes. Impression . . . . . . . The patient is  tolerating radiation. Plan . . . . . . . . . . . . Continue treatment as planned.  ________________________________  Artist Pais. Kathrynn Running, M.D.

## 2012-05-07 ENCOUNTER — Ambulatory Visit: Payer: Self-pay | Admitting: Internal Medicine

## 2012-05-07 ENCOUNTER — Ambulatory Visit
Admission: RE | Admit: 2012-05-07 | Discharge: 2012-05-07 | Disposition: A | Discharge status: Home / Self Care | Payer: Medicare Other | Source: Ambulatory Visit | Attending: Radiation Oncology | Admitting: Radiation Oncology

## 2012-05-08 ENCOUNTER — Ambulatory Visit
Admission: RE | Admit: 2012-05-08 | Discharge: 2012-05-08 | Disposition: A | Discharge status: Home / Self Care | Payer: Medicare Other | Source: Ambulatory Visit | Attending: Radiation Oncology | Admitting: Radiation Oncology

## 2012-05-09 ENCOUNTER — Ambulatory Visit
Admission: RE | Admit: 2012-05-09 | Discharge: 2012-05-09 | Disposition: A | Discharge status: Home / Self Care | Payer: Medicare Other | Source: Ambulatory Visit | Attending: Radiation Oncology | Admitting: Radiation Oncology

## 2012-05-09 ENCOUNTER — Other Ambulatory Visit (HOSPITAL_BASED_OUTPATIENT_CLINIC_OR_DEPARTMENT_OTHER): Payer: Medicare Other | Admitting: Lab

## 2012-05-09 ENCOUNTER — Encounter: Payer: Self-pay | Admitting: Radiation Oncology

## 2012-05-09 ENCOUNTER — Telehealth: Payer: Self-pay | Admitting: Internal Medicine

## 2012-05-09 ENCOUNTER — Encounter: Payer: Self-pay | Admitting: Internal Medicine

## 2012-05-09 ENCOUNTER — Encounter: Payer: Self-pay | Admitting: *Deleted

## 2012-05-09 ENCOUNTER — Ambulatory Visit (HOSPITAL_BASED_OUTPATIENT_CLINIC_OR_DEPARTMENT_OTHER): Payer: Medicare Other | Admitting: Internal Medicine

## 2012-05-09 DIAGNOSIS — C3430 Malignant neoplasm of lower lobe, unspecified bronchus or lung: Secondary | ICD-10-CM

## 2012-05-09 LAB — COMPREHENSIVE METABOLIC PANEL (CC13)
AST: 11 U/L (ref 5–34)
BUN: 29.7 mg/dL — ABNORMAL HIGH (ref 7.0–26.0)
Calcium: 9.7 mg/dL (ref 8.4–10.4)
Chloride: 102 mEq/L (ref 98–107)
Creatinine: 1.6 mg/dL — ABNORMAL HIGH (ref 0.7–1.3)
Glucose: 156 mg/dl — ABNORMAL HIGH (ref 70–99)

## 2012-05-09 LAB — CBC WITH DIFFERENTIAL/PLATELET
BASO%: 1 % (ref 0.0–2.0)
Eosinophils Absolute: 0.4 10*3/uL (ref 0.0–0.5)
HCT: 29.7 % — ABNORMAL LOW (ref 38.4–49.9)
LYMPH%: 9.5 % — ABNORMAL LOW (ref 14.0–49.0)
MCHC: 34.1 g/dL (ref 32.0–36.0)
MONO#: 0.4 10*3/uL (ref 0.1–0.9)
NEUT%: 76.7 % — ABNORMAL HIGH (ref 39.0–75.0)
Platelets: 198 10*3/uL (ref 140–400)
WBC: 6.5 10*3/uL (ref 4.0–10.3)

## 2012-05-09 NOTE — Progress Notes (Signed)
Spoke with pt and wife at [facility] today.  No questions or concerns at this time

## 2012-05-09 NOTE — Telephone Encounter (Signed)
, °

## 2012-05-09 NOTE — Progress Notes (Signed)
[facility] [phone]   [phone]  OFFICE PROGRESS NOTE  [name], MD [address] [place] [address]  PRINCIPAL DIAGNOSIS: Stage IIIB/IV non-small cell lung cancer diagnosed in June 2012.   PRIOR THERAPY:  1) Status post a course of concurrent chemoradiation with weekly carboplatin and paclitaxel. Last dose of chemotherapy was given on November 01, 2010.  2) Systemic chemotherapy with carboplatin for an AUC of 5 given on day 1 and gemcitabine 1000 mg per meter square given on days 1 and 8 every 3 weeks status post 1 cycle. However due to thrombocytopenia from cycle 2 forward he will proceed with carboplatin for an AUC of 5 given on day 1 and gemcitabine at 800 mg per meter squared given on days 1 and 8 every 3 weeks. Status post 6 cycles with evidence for disease progression after cycle #6.  CURRENT THERAPY: Palliative radiotherapy to the enlarging left lower lobe lung mass under the care of Dr. Kathrynn Running expected to be completed next week.  INTERVAL HISTORY: [name] 77 y.o. male returns to the clinic today for followup visit accompanied his wife. The patient is tolerating his palliative radiotherapy to the left lower lobe lung mass fairly well with no significant adverse effects. He denied having any significant esophagitis. He denied having any chest pain, shortness breath, cough or hemoptysis. He lost a few pounds recently secondary to lack of appetite. The patient denied having any significant nausea or vomiting, no fever or chills.  MEDICAL HISTORY: Past Medical History  Diagnosis Date  . Lung cancer 2012    recurrence 12/2011  . Diabetes mellitus   . HTN (hypertension)   . High cholesterol   . Hx of radiation therapy 09/22/10 - 11/05/10    LLL lung  . History of chemotherapy     ALLERGIES:  is allergic to codeine and penicillins.  MEDICATIONS:  Current Outpatient Prescriptions  Medication Sig Dispense  Refill  . enalapril (VASOTEC) 5 MG tablet Take 5 mg by mouth daily.      Marland Kitchen glimepiride (AMARYL) 4 MG tablet 2 tabs daily with breakfast      . latanoprost (XALATAN) 0.005 % ophthalmic solution Place 1 drop into both eyes at bedtime.       . lovastatin (MEVACOR) 20 MG tablet Take 20 mg by mouth at bedtime.      . metFORMIN (GLUCOPHAGE) 1000 MG tablet Take 1 tablet by mouth daily.      . NON FORMULARY Pt is currently receiving CHemotherapy      . pioglitazone (ACTOS) 30 MG tablet Take 30 mg by mouth daily.       . prochlorperazine (COMPAZINE) 10 MG tablet Take 1 tablet (10 mg total) by mouth every 6 (six) hours as needed.  30 tablet  0  . sitaGLIPtin (JANUVIA) 100 MG tablet Take 100 mg by mouth daily.      Marland Kitchen tiotropium (SPIRIVA HANDIHALER) 18 MCG inhalation capsule Place 1 capsule (18 mcg total) into inhaler and inhale daily.  30 capsule  6   No current facility-administered medications for this visit.   Facility-Administered Medications Ordered in Other Visits  Medication Dose Route Frequency Provider Last Rate Last Dose  . sodium chloride 0.9 % injection 10 mL  10 mL Intracatheter PRN Si Gaul, MD   10 mL at 11/24/11 1637    SURGICAL HISTORY:  Past Surgical History  Procedure Laterality Date  . Tonsillectomy    . Gastrostomy  tube placement  2011    has been removed  . Portacath placement  08/2010    REVIEW OF SYSTEMS:  A comprehensive review of systems was negative except for: Constitutional: positive for anorexia and weight loss   PHYSICAL EXAMINATION: General appearance: alert, cooperative and no distress Head: Normocephalic, without obvious abnormality, atraumatic Neck: no adenopathy Resp: clear to auscultation bilaterally Cardio: regular rate and rhythm, S1, S2 normal, no murmur, click, rub or gallop GI: soft, non-tender; bowel sounds normal; no masses,  no organomegaly Extremities: extremities normal, atraumatic, no cyanosis or edema  ECOG PERFORMANCE STATUS: 1 -  Symptomatic but completely ambulatory  Blood pressure 104/55, pulse 99, temperature 98.4 F (36.9 C), temperature source Oral, resp. rate 20, height 5' 10.5" (1.791 m), weight 197 lb 14.4 oz (89.767 kg).  LABORATORY DATA: Lab Results  Component Value Date   WBC 6.5 05/09/2012   HGB 10.1* 05/09/2012   HCT 29.7* 05/09/2012   MCV 93.2 05/09/2012   PLT 198 05/09/2012      Chemistry      Component Value Date/Time   NA 137 05/09/2012 1321   NA 134 10/19/2011 0817   NA 138 12/09/2010 0531   K 4.7 05/09/2012 1321   K 4.5 10/19/2011 0817   K 4.0 12/09/2010 0531   CL 102 05/09/2012 1321   CL 99 10/19/2011 0817   CL 103 12/09/2010 0531   CO2 24 05/09/2012 1321   CO2 27 10/19/2011 0817   CO2 27 12/09/2010 0531   BUN 29.7* 05/09/2012 1321   BUN 13 10/19/2011 0817   BUN 12 12/09/2010 0531   CREATININE 1.6* 05/09/2012 1321   CREATININE 1.4* 10/19/2011 0817   CREATININE 1.01 12/09/2010 0531      Component Value Date/Time   CALCIUM 9.7 05/09/2012 1321   CALCIUM 9.3 10/19/2011 0817   CALCIUM 9.6 12/09/2010 0531   ALKPHOS 80 05/09/2012 1321   ALKPHOS 48 10/19/2011 0817   ALKPHOS 73 12/08/2010 0600   AST 11 05/09/2012 1321   AST 19 10/19/2011 0817   AST 17 12/08/2010 0600   ALT 8 05/09/2012 1321   ALT 15 12/08/2010 0600   BILITOT 0.46 05/09/2012 1321   BILITOT 0.90 10/19/2011 0817   BILITOT 0.4 12/08/2010 0600       RADIOGRAPHIC STUDIES: No results found.  ASSESSMENT: This is a very pleasant 77 years old white male with history of stage IIIB/4 non-small cell lung cancer status post concurrent chemoradiation followed by systemic chemotherapy with carboplatin and gemcitabine for evidence of disease progression this was discontinued in January of 2014 after further evidence of disease progression in the left lower lobe lung mass. The patient is currently undergoing palliative radiotherapy to this area. He is tolerating it fairly well.  PLAN: I recommended for the patient to complete his radiotherapy as scheduled next week. I  would see him back for followup visit in one month with repeat CT scan of the chest, abdomen and pelvis for reevaluation of his disease. If he has any evidence for further disease progression on the upcoming scan, I would consider the patient for systemic chemotherapy or treatment with oral Tarceva. He was advised to call immediately if she has any concerning symptoms in the interval. All questions were answered. The patient knows to call the clinic with any problems, questions or concerns. We can certainly see the patient much sooner if necessary.

## 2012-05-09 NOTE — Progress Notes (Signed)
  Radiation Oncology         [phone] ________________________________  Name: [name] MRN: [id number]  Date: 05/09/2012  DOB: Jun 22, 1935  Weekly Radiation Therapy Management  Current Dose: 25 Gy     Planned Dose:  35 Gy  Narrative . . . . . . . . The patient presents for routine under treatment assessment.                                                   Patient completed 10 rac tx to the chest,alert,oriented x3, coughs up clear pheglm at times, 99% room air sats, gets sob with steps/hils, no c/o pain or discomfort, eating well                                 Set-up films were reviewed.                                 The chart was checked. Physical Findings. . .  weight is 197 lb 4.8 oz (89.495 kg). His oral temperature is 98.7 F (37.1 C). His blood pressure is 104/54 and his pulse is 88. His respiration is 20 and oxygen saturation is 99%. . Weight essentially stable.  No significant changes. Impression . . . . . . . The patient is  tolerating radiation. Plan . . . . . . . . . . . . Continue treatment as planned.  ________________________________  Artist Pais. Kathrynn Running, M.D.

## 2012-05-09 NOTE — Progress Notes (Signed)
Patient completed 10  rac tx to the chest,alert,oriented x3, coughs up clear pheglm at times, 99% room air sats, gets sob with steps/hils, no c/o pain or discomfort, eating well 12:02 PM

## 2012-05-09 NOTE — Patient Instructions (Signed)
Continue radiation as scheduled. Followup visit in one month with repeat CT scan of the chest, abdomen and pelvis.

## 2012-05-10 ENCOUNTER — Ambulatory Visit
Admission: RE | Admit: 2012-05-10 | Discharge: 2012-05-10 | Disposition: A | Discharge status: Home / Self Care | Payer: Medicare Other | Source: Ambulatory Visit | Attending: Radiation Oncology | Admitting: Radiation Oncology

## 2012-05-11 ENCOUNTER — Ambulatory Visit
Admission: RE | Admit: 2012-05-11 | Discharge: 2012-05-11 | Disposition: A | Discharge status: Home / Self Care | Payer: Medicare Other | Source: Ambulatory Visit | Attending: Radiation Oncology | Admitting: Radiation Oncology

## 2012-05-14 ENCOUNTER — Ambulatory Visit
Admission: RE | Admit: 2012-05-14 | Discharge: 2012-05-14 | Disposition: A | Discharge status: Home / Self Care | Payer: Medicare Other | Source: Ambulatory Visit | Attending: Radiation Oncology | Admitting: Radiation Oncology

## 2012-05-14 ENCOUNTER — Ambulatory Visit: Payer: Medicare Other

## 2012-05-15 ENCOUNTER — Encounter: Payer: Self-pay | Admitting: Radiation Oncology

## 2012-05-15 ENCOUNTER — Ambulatory Visit
Admission: RE | Admit: 2012-05-15 | Discharge: 2012-05-15 | Disposition: A | Discharge status: Home / Self Care | Payer: Medicare Other | Source: Ambulatory Visit | Attending: Radiation Oncology | Admitting: Radiation Oncology

## 2012-05-15 ENCOUNTER — Ambulatory Visit: Payer: Medicare Other

## 2012-05-15 NOTE — Progress Notes (Addendum)
Patient here weekly rad tx last treatment today, 14/14 chest, patient alert,oriented x3, no c/o pain, nausea, sob, room air sats=98% , vitals wnl, eating and drinking well, energy level  Down some"It could be a little better,, fatigued some, " patient is pale, haven't walked lately due to weather being so cold", needs to schedule to have cataract surgery , gave 1 month f/u appt card to patient  11:27 AM

## 2012-05-15 NOTE — Progress Notes (Signed)
Weekly Management Note Current Dose: 35 Gy  Projected Dose: 35 Gy   Narrative:  The patient presents for routine under treatment assessment.  CBCT/MVCT images/Port film x-rays were reviewed.  The chart was checked. Doing well. No complaints. No dysphagia or pain. Fatigue stable.   Physical Findings: Weight:  . Unchanged. Alert and oriented.   Impression:  The patient is tolerating radiation.  Plan:  Continue treatment as planned. Follow up in 1 month. Call sooner with questions or concerns.

## 2012-05-17 NOTE — Progress Notes (Signed)
  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  Beams/energy:   Due to the nature of the patient's reirradiation, special care was taken to minimize reirradiation of the spinal cord and esophagus great vessels heart and chest wall. In order to accomplish this, helical IM RT was delivered on the TomoTherapy machine.  This included image guidance with daily megavoltage CT studies.  Narrative: The patient tolerated radiation treatment relatively well.   He did not experience any acute complications related to his treatment.  Plan: The patient has completed radiation treatment. The patient will return to radiation oncology clinic for routine followup in one month. I advised them to call or return sooner if they have any questions or concerns related to their recovery or treatment. ________________________________  Artist Pais. Kathrynn Running, M.D.

## 2012-05-21 ENCOUNTER — Other Ambulatory Visit: Payer: Self-pay | Admitting: Dermatology

## 2012-06-06 ENCOUNTER — Encounter (HOSPITAL_COMMUNITY): Payer: Self-pay

## 2012-06-06 ENCOUNTER — Other Ambulatory Visit (HOSPITAL_BASED_OUTPATIENT_CLINIC_OR_DEPARTMENT_OTHER): Payer: Medicare Other

## 2012-06-06 ENCOUNTER — Ambulatory Visit (HOSPITAL_COMMUNITY)
Admission: RE | Admit: 2012-06-06 | Discharge: 2012-06-06 | Disposition: A | Discharge status: Home / Self Care | Payer: Medicare Other | Source: Ambulatory Visit | Attending: Internal Medicine | Admitting: Internal Medicine

## 2012-06-06 DIAGNOSIS — C3490 Malignant neoplasm of unspecified part of unspecified bronchus or lung: Secondary | ICD-10-CM | POA: Insufficient documentation

## 2012-06-06 DIAGNOSIS — R911 Solitary pulmonary nodule: Secondary | ICD-10-CM | POA: Insufficient documentation

## 2012-06-06 DIAGNOSIS — C3430 Malignant neoplasm of lower lobe, unspecified bronchus or lung: Secondary | ICD-10-CM

## 2012-06-06 LAB — CBC WITH DIFFERENTIAL/PLATELET
BASO%: 1.3 % (ref 0.0–2.0)
EOS%: 7.3 % — ABNORMAL HIGH (ref 0.0–7.0)
HCT: 30.7 % — ABNORMAL LOW (ref 38.4–49.9)
LYMPH%: 11.9 % — ABNORMAL LOW (ref 14.0–49.0)
MCH: 30 pg (ref 27.2–33.4)
MCHC: 33.2 g/dL (ref 32.0–36.0)
MCV: 90.5 fL (ref 79.3–98.0)
MONO%: 11.8 % (ref 0.0–14.0)
NEUT%: 67.7 % (ref 39.0–75.0)
Platelets: 170 10*3/uL (ref 140–400)
RBC: 3.39 10*6/uL — ABNORMAL LOW (ref 4.20–5.82)
WBC: 4.7 10*3/uL (ref 4.0–10.3)

## 2012-06-07 ENCOUNTER — Telehealth: Payer: Self-pay | Admitting: Internal Medicine

## 2012-06-07 ENCOUNTER — Telehealth: Payer: Self-pay | Admitting: *Deleted

## 2012-06-07 ENCOUNTER — Ambulatory Visit (HOSPITAL_BASED_OUTPATIENT_CLINIC_OR_DEPARTMENT_OTHER): Payer: Medicare Other | Admitting: Internal Medicine

## 2012-06-07 ENCOUNTER — Encounter: Payer: Self-pay | Admitting: Internal Medicine

## 2012-06-07 DIAGNOSIS — C3430 Malignant neoplasm of lower lobe, unspecified bronchus or lung: Secondary | ICD-10-CM

## 2012-06-07 NOTE — Progress Notes (Signed)
[facility] [phone]   [phone]  OFFICE PROGRESS NOTE  [name], MD [address] [place] [address]  PRINCIPAL DIAGNOSIS: Stage IIIB/IV non-small cell lung cancer, squamous cell carcinoma diagnosed in June 2012.   PRIOR THERAPY:  1) Status post a course of concurrent chemoradiation with weekly carboplatin and paclitaxel. Last dose of chemotherapy was given on November 01, 2010.  2) Systemic chemotherapy with carboplatin for an AUC of 5 given on day 1 and gemcitabine 1000 mg per meter square given on days 1 and 8 every 3 weeks status post 1 cycle. However due to thrombocytopenia from cycle 2 forward he will proceed with carboplatin for an AUC of 5 given on day 1 and gemcitabine at 800 mg per meter squared given on days 1 and 8 every 3 weeks. Status post 6 cycles with evidence for disease progression after cycle #6. 3) Palliative radiotherapy to the enlarging left lower lobe lung mass under the care of Dr. Kathrynn Running.  CURRENT THERAPY: The patient will start next week the first cycle of systemic chemotherapy with docetaxel 75 mg/M2 every 3 weeks with Neulasta support.  INTERVAL HISTORY: [name] 77 y.o. male returns to the clinic today for followup visit accompanied by his wife. The patient is feeling fine today with no specific complaints. He denied having any significant chest pain, shortness breath, cough or hemoptysis. Has no significant weight loss or night sweats. Has no headache or blurry vision. The patient had repeat CT scan of the chest, abdomen and pelvis performed recently and he is here today for evaluation and discussion of his scan results.  MEDICAL HISTORY: Past Medical History  Diagnosis Date  . Lung cancer 2012    recurrence 12/2011  . Diabetes mellitus   . HTN (hypertension)   . High cholesterol   . Hx of radiation therapy 09/22/10 - 11/05/10    LLL lung  . History of chemotherapy     ALLERGIES:  is  allergic to codeine and penicillins.  MEDICATIONS:  Current Outpatient Prescriptions  Medication Sig Dispense Refill  . enalapril (VASOTEC) 5 MG tablet Take 5 mg by mouth daily.      Marland Kitchen glimepiride (AMARYL) 4 MG tablet 2 tabs daily with breakfast      . latanoprost (XALATAN) 0.005 % ophthalmic solution Place 1 drop into both eyes at bedtime.       . lovastatin (MEVACOR) 20 MG tablet Take 20 mg by mouth at bedtime.      . NON FORMULARY Pt is currently receiving CHemotherapy      . pioglitazone (ACTOS) 30 MG tablet Take 30 mg by mouth daily.       . prochlorperazine (COMPAZINE) 10 MG tablet Take 1 tablet (10 mg total) by mouth every 6 (six) hours as needed.  30 tablet  0  . sitaGLIPtin (JANUVIA) 100 MG tablet Take 100 mg by mouth daily.      Marland Kitchen tiotropium (SPIRIVA HANDIHALER) 18 MCG inhalation capsule Place 1 capsule (18 mcg total) into inhaler and inhale daily.  30 capsule  6   No current facility-administered medications for this visit.   Facility-Administered Medications Ordered in Other Visits  Medication Dose Route Frequency Provider Last Rate Last Dose  . sodium chloride 0.9 % injection 10 mL  10 mL Intracatheter PRN Si Gaul, MD   10 mL at 11/24/11 1637    SURGICAL HISTORY:  Past Surgical History  Procedure Laterality Date  . Tonsillectomy    .  Gastrostomy tube placement  2011    has been removed  . Portacath placement  08/2010    REVIEW OF SYSTEMS:  A comprehensive review of systems was negative.   PHYSICAL EXAMINATION: General appearance: alert, cooperative and no distress Head: Normocephalic, without obvious abnormality, atraumatic Neck: no adenopathy Lymph nodes: Cervical, supraclavicular, and axillary nodes normal. Resp: clear to auscultation bilaterally Cardio: regular rate and rhythm, S1, S2 normal, no murmur, click, rub or gallop GI: soft, non-tender; bowel sounds normal; no masses,  no organomegaly Extremities: extremities normal, atraumatic, no cyanosis or  edema Neurologic: Alert and oriented X 3, normal strength and tone. Normal symmetric reflexes. Normal coordination and gait  ECOG PERFORMANCE STATUS: 1 - Symptomatic but completely ambulatory  Blood pressure 122/67, pulse 98, temperature 97 F (36.1 C), temperature source Oral, resp. rate 20, height 5' 10.5" (1.791 m), weight 193 lb 8 oz (87.771 kg).  LABORATORY DATA: Lab Results  Component Value Date   WBC 4.7 06/06/2012   HGB 10.2* 06/06/2012   HCT 30.7* 06/06/2012   MCV 90.5 06/06/2012   PLT 170 06/06/2012      Chemistry      Component Value Date/Time   NA 137 05/09/2012 1321   NA 134 10/19/2011 0817   NA 138 12/09/2010 0531   K 4.7 05/09/2012 1321   K 4.5 10/19/2011 0817   K 4.0 12/09/2010 0531   CL 102 05/09/2012 1321   CL 99 10/19/2011 0817   CL 103 12/09/2010 0531   CO2 24 05/09/2012 1321   CO2 27 10/19/2011 0817   CO2 27 12/09/2010 0531   BUN 29.7* 05/09/2012 1321   BUN 13 10/19/2011 0817   BUN 12 12/09/2010 0531   CREATININE 1.6* 05/09/2012 1321   CREATININE 1.4* 10/19/2011 0817   CREATININE 1.01 12/09/2010 0531      Component Value Date/Time   CALCIUM 9.7 05/09/2012 1321   CALCIUM 9.3 10/19/2011 0817   CALCIUM 9.6 12/09/2010 0531   ALKPHOS 80 05/09/2012 1321   ALKPHOS 48 10/19/2011 0817   ALKPHOS 73 12/08/2010 0600   AST 11 05/09/2012 1321   AST 19 10/19/2011 0817   AST 17 12/08/2010 0600   ALT 8 05/09/2012 1321   ALT 15 12/08/2010 0600   BILITOT 0.46 05/09/2012 1321   BILITOT 0.90 10/19/2011 0817   BILITOT 0.4 12/08/2010 0600       RADIOGRAPHIC STUDIES: Ct Chest Wo Contrast  06/06/2012  *RADIOLOGY REPORT*  Clinical Data:  Lung cancer with recurrence  CT CHEST, ABDOMEN AND PELVIS WITHOUT CONTRAST  Technique:  Multidetector CT imaging of the chest, abdomen and pelvis was performed following the standard protocol without IV contrast.  Comparison:  CT 01/17/2012   CT CHEST  Findings:  Within the left lower lobe,  continued enlargement of the rounded centrally necrotic mass most consistent with  disease progression. Lesion measures 3.8 x 4.0 cm compared to 3.4 x 2.9 cm on 01/16/2029.  There consolidation and air bronchograms surrounding the  mass which are similar to comparison exam.  Extensive central lobular emphysema in upper lobes.  Within the right upper lobe, 4 mm nodule (image 32) is similar to 3 mm on prior.  No axillary or supraclavicular lymphadenopathy.  There is a port in the right chest wall.  No mediastinal or hilar lymphadenopathy.  No pericardial fluid.  IMPRESSION:  1.  Interval enlargement of low density mass within the left lower lobe consistent with tumor progression. 2.  Stable right upper lobe pulmonary nodule.  CT ABDOMEN AND PELVIS  Findings:  No focal hepatic lesion.  The gallbladder, pancreas, spleen, adrenal glands, and kidneys are normal.  The stomach, small bowel, appendix, cecum are normal.  The colon and rectosigmoid colon are normal.  Abdominal aorta is normal caliber.  No retroperitoneal periportal lymphadenopathy.  No mesenteric disease.  Prostate gland bladder normal.  No pelvic lymphadenopathy. Review of  bone windows demonstrates no aggressive osseous lesions.  IMPRESSION: No evidence metastasis in the abdomen or pelvis.   Original Report Authenticated By: Genevive Bi, M.D.     ASSESSMENT: This is a very pleasant 77 years old white male with stage IIIB/4 non-small cell lung cancer, squamous cell carcinoma status post concurrent chemoradiation as well as systemic chemotherapy with carboplatin and gemcitabine and recently palliative radiotherapy to the enlarging left lower lobe lung mass but unfortunately the mass continues to increase in size even after the radiotherapy.   PLAN: I discussed the scan results with the patient and his wife today. I recommended for him to consider systemic chemotherapy with single agent docetaxel 75 mg/M2 every 3 weeks with Neulasta support. I discussed with the patient adverse effect of this treatment including but not limited to  alopecia, myelosuppression, nausea and vomiting, peripheral neuropathy, liver or renal dysfunction. The patient would like to proceed with treatment as planned. He is expected to start the first cycle of this treatment next week. I will send a prescription to his pharmacy with Decadron 8 mg by mouth twice a day the day before, day of and day after the chemotherapy. He would come back for followup visit in 3 weeks with the second cycle of his chemotherapy. He was advised to call immediately if he has any concerning symptoms in the interval.  All questions were answered. The patient knows to call the clinic with any problems, questions or concerns. We can certainly see the patient much sooner if necessary.  I spent 15 minutes counseling the patient face to face. The total time spent in the appointment was 25 minutes.

## 2012-06-07 NOTE — Telephone Encounter (Signed)
Per staff phone call and POF I have schedueld appts.  JMW  

## 2012-06-08 ENCOUNTER — Other Ambulatory Visit: Payer: Self-pay | Admitting: *Deleted

## 2012-06-08 MED ORDER — DEXAMETHASONE 4 MG PO TABS: 4.0000 mg | ORAL_TABLET | ORAL | Status: DC

## 2012-06-09 NOTE — Patient Instructions (Signed)
Unfortunately your recent scan showed evidence for disease progression. We discussed systemic chemotherapy including single agent docetaxel. He would be to the current plan and this does expected next week. Followup visit in 4 weeks with the second cycle of chemotherapy.

## 2012-06-12 ENCOUNTER — Other Ambulatory Visit (HOSPITAL_BASED_OUTPATIENT_CLINIC_OR_DEPARTMENT_OTHER): Payer: Medicare Other | Admitting: Lab

## 2012-06-12 ENCOUNTER — Encounter: Payer: Self-pay | Admitting: Radiation Oncology

## 2012-06-12 ENCOUNTER — Ambulatory Visit (HOSPITAL_BASED_OUTPATIENT_CLINIC_OR_DEPARTMENT_OTHER): Payer: Medicare Other

## 2012-06-12 DIAGNOSIS — C3430 Malignant neoplasm of lower lobe, unspecified bronchus or lung: Secondary | ICD-10-CM

## 2012-06-12 DIAGNOSIS — Z5111 Encounter for antineoplastic chemotherapy: Secondary | ICD-10-CM

## 2012-06-12 LAB — CBC WITH DIFFERENTIAL/PLATELET
BASO%: 0.1 % (ref 0.0–2.0)
EOS%: 0 % (ref 0.0–7.0)
LYMPH%: 7.2 % — ABNORMAL LOW (ref 14.0–49.0)
MCH: 30.1 pg (ref 27.2–33.4)
MCHC: 33.3 g/dL (ref 32.0–36.0)
MONO#: 0.3 10*3/uL (ref 0.1–0.9)
NEUT%: 88.5 % — ABNORMAL HIGH (ref 39.0–75.0)
RBC: 3.36 10*6/uL — ABNORMAL LOW (ref 4.20–5.82)
WBC: 7.8 10*3/uL (ref 4.0–10.3)
lymph#: 0.6 10*3/uL — ABNORMAL LOW (ref 0.9–3.3)
nRBC: 0 % (ref 0–0)

## 2012-06-12 LAB — COMPREHENSIVE METABOLIC PANEL (CC13)
ALT: 10 U/L (ref 0–55)
AST: 10 U/L (ref 5–34)
Alkaline Phosphatase: 70 U/L (ref 40–150)
BUN: 32.2 mg/dL — ABNORMAL HIGH (ref 7.0–26.0)
Calcium: 9.5 mg/dL (ref 8.4–10.4)
Chloride: 102 mEq/L (ref 98–107)
Creatinine: 1.4 mg/dL — ABNORMAL HIGH (ref 0.7–1.3)
Potassium: 4.7 mEq/L (ref 3.5–5.1)

## 2012-06-12 MED ORDER — SODIUM CHLORIDE 0.9 % IJ SOLN
10.0000 mL | INTRAMUSCULAR | Status: DC | PRN
  Administered 2012-06-12: 10 mL
  Filled 2012-06-12: qty 10

## 2012-06-12 MED ORDER — HEPARIN SOD (PORK) LOCK FLUSH 100 UNIT/ML IV SOLN
500.0000 [IU] | Freq: Once | INTRAVENOUS | Status: AC | PRN
  Administered 2012-06-12: 500 [IU]
  Filled 2012-06-12: qty 5

## 2012-06-12 MED ORDER — ONDANSETRON 8 MG/50ML IVPB (CHCC)
8.0000 mg | Freq: Once | INTRAVENOUS | Status: AC
  Administered 2012-06-12: 8 mg via INTRAVENOUS

## 2012-06-12 MED ORDER — DEXAMETHASONE SODIUM PHOSPHATE 10 MG/ML IJ SOLN
10.0000 mg | Freq: Once | INTRAMUSCULAR | Status: AC
  Administered 2012-06-12: 10 mg via INTRAVENOUS

## 2012-06-12 MED ORDER — SODIUM CHLORIDE 0.9 % IV SOLN
75.0000 mg/m2 | Freq: Once | INTRAVENOUS | Status: AC
  Administered 2012-06-12: 160 mg via INTRAVENOUS
  Filled 2012-06-12: qty 16

## 2012-06-12 MED ORDER — SODIUM CHLORIDE 0.9 % IV SOLN
Freq: Once | INTRAVENOUS | Status: AC
  Administered 2012-06-12: 10:00:00 via INTRAVENOUS

## 2012-06-12 NOTE — Patient Instructions (Signed)
[facility] Discharge Instructions for Patients Receiving Chemotherapy  Today you received the following chemotherapy agents Taxotere  To help prevent nausea and vomiting after your treatment, we encourage you to take your nausea medication as directed.   If you develop nausea and vomiting that is not controlled by your nausea medication, call the clinic. If it is after clinic hours your family physician or the after hours number for the clinic or go to the Emergency Department.   BELOW ARE SYMPTOMS THAT SHOULD BE REPORTED IMMEDIATELY:  *FEVER GREATER THAN 100.5 F  *CHILLS WITH OR WITHOUT FEVER  NAUSEA AND VOMITING THAT IS NOT CONTROLLED WITH YOUR NAUSEA MEDICATION  *UNUSUAL SHORTNESS OF BREATH  *UNUSUAL BRUISING OR BLEEDING  TENDERNESS IN MOUTH AND THROAT WITH OR WITHOUT PRESENCE OF ULCERS  *URINARY PROBLEMS  *BOWEL PROBLEMS  UNUSUAL RASH Items with * indicate a potential emergency and should be followed up as soon as possible.  One of the nurses will contact you 24 hours after your treatment. Please let the nurse know about any problems that you may have experienced. Feel free to call the clinic you have any questions or concerns. The clinic phone number is [phone].   I have been informed and understand all the instructions given to me. I know to contact the clinic, my physician, or go to the Emergency Department if any problems should occur. I do not have any questions at this time, but understand that I may call the clinic during office hours   should I have any questions or need assistance in obtaining follow up care.    __________________________________________  _____________  __________ Signature of Patient or Authorized Representative            Date                   Time    __________________________________________ Nurse's Signature

## 2012-06-13 ENCOUNTER — Telehealth: Payer: Self-pay | Admitting: *Deleted

## 2012-06-13 ENCOUNTER — Ambulatory Visit (HOSPITAL_BASED_OUTPATIENT_CLINIC_OR_DEPARTMENT_OTHER): Payer: Medicare Other

## 2012-06-13 DIAGNOSIS — Z5189 Encounter for other specified aftercare: Secondary | ICD-10-CM

## 2012-06-13 DIAGNOSIS — C3430 Malignant neoplasm of lower lobe, unspecified bronchus or lung: Secondary | ICD-10-CM

## 2012-06-13 MED ORDER — PEGFILGRASTIM INJECTION 6 MG/0.6ML
6.0000 mg | Freq: Once | SUBCUTANEOUS | Status: AC
  Administered 2012-06-13: 6 mg via SUBCUTANEOUS
  Filled 2012-06-13: qty 0.6

## 2012-06-13 NOTE — Patient Instructions (Addendum)

## 2012-06-13 NOTE — Telephone Encounter (Signed)
[name] here for Neulasta injection following 1st taxot chemo treatment.  States that he is doing great, no nausea, vomiting, or diarrhea.  He is eating and drinking lots of fluids.  All questions answered.  Knows to call the office if he has any problems or questions.

## 2012-06-14 ENCOUNTER — Ambulatory Visit
Admission: RE | Admit: 2012-06-14 | Discharge: 2012-06-14 | Disposition: A | Discharge status: Home / Self Care | Payer: Medicare Other | Source: Ambulatory Visit | Attending: Radiation Oncology | Admitting: Radiation Oncology

## 2012-06-14 ENCOUNTER — Encounter: Payer: Self-pay | Admitting: Radiation Oncology

## 2012-06-14 HISTORY — DX: Personal history of irradiation: Z92.3

## 2012-06-14 NOTE — Progress Notes (Signed)
Follow up  appt s/p rad tx  04/24/12-05/15/12 LLLung 35Gy/14 fx CT done chest  06/06/12 progression   !st Taxol given 06/12/12 with Neulasta support,Decadron per Chemotherapy protocol Decadron per chemotherapy protocol, Claritin daily as well,Ibuprofen prn Alert,oriented x3, patient denys pain,coughing,nausea, does have shortness breath walking down here , low b/p, took sitting and standing, hasn't taken his vasotec today,  Vitals Sitting= T=98.2, b/p=99/53,p=98,rr=24, oxygen sats 98%room auir after sitting a few minutes up from 92%  Standing b/p=95/61,p=97

## 2012-06-14 NOTE — Progress Notes (Signed)
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 Initial curative, chemoradiotherapy from September 22, 2010, through November 05, 2010 to 66 Gy in 33 fractions using IMRT with tomotherapy,  2. Local recurrence treated for Palliation, local control 04/24/2012-05/15/2012  treated to 35 gray in 14 fractions   Interval Since Last Radiation:  1  months  Narrative:  The patient returns today for routine follow-up.  He is essentially without complaint.                              ALLERGIES:  is allergic to codeine and penicillins.  Meds: Current Outpatient Prescriptions  Medication Sig Dispense Refill  . dexamethasone (DECADRON) 4 MG tablet Take 1 tablet (4 mg total) by mouth as directed. 2 tablets BID day before, day of, day after chemotherapy.  40 tablet  1  . glimepiride (AMARYL) 4 MG tablet 2 tabs daily with breakfast      . ibuprofen (ADVIL,MOTRIN) 400 MG tablet Take 400 mg by mouth every 8 (eight) hours as needed for pain.      Marland Kitchen latanoprost (XALATAN) 0.005 % ophthalmic solution Place 1 drop into both eyes at bedtime.       Marland Kitchen loratadine (CLARITIN) 10 MG tablet Take 10 mg by mouth daily.      Marland Kitchen lovastatin (MEVACOR) 20 MG tablet Take 20 mg by mouth at bedtime.      . NON FORMULARY Pt is currently receiving CHemotherapy      . pioglitazone (ACTOS) 30 MG tablet Take 30 mg by mouth daily.       . prochlorperazine (COMPAZINE) 10 MG tablet Take 1 tablet (10 mg total) by mouth every 6 (six) hours as needed.  30 tablet  0  . sitaGLIPtin (JANUVIA) 100 MG tablet Take 100 mg by mouth daily.      Marland Kitchen tiotropium (SPIRIVA HANDIHALER) 18 MCG inhalation capsule Place 1 capsule (18 mcg total) into inhaler and  inhale daily.  30 capsule  6  . enalapril (VASOTEC) 5 MG tablet Take 5 mg by mouth daily.       No current facility-administered medications for this encounter.   Facility-Administered Medications Ordered in Other Encounters  Medication Dose Route Frequency Provider Last Rate Last Dose  . sodium chloride 0.9 % injection 10 mL  10 mL Intracatheter PRN Si Gaul, MD   10 mL at 11/24/11 1637    Physical Findings: The patient is in no acute distress. Patient is alert and oriented.  height is 5' 10.5" (1.791 m) and weight is 199 lb 4.8 oz (90.402 kg). His oral temperature is 98.2 F (36.8 C). His blood pressure is 95/61 and his pulse is 98. His respiration is 24 and oxygen saturation is 98%. .  No significant changes.  Radiographic Findings: Ct Abdomen Pelvis Wo Contrast  06/06/2012  *RADIOLOGY REPORT*  Clinical Data:  Lung cancer with recurrence  CT CHEST, ABDOMEN AND PELVIS WITHOUT CONTRAST  Technique:  Multidetector CT imaging of the chest, abdomen and pelvis was performed following the standard protocol without IV contrast.  Comparison:  CT 01/17/2012  CT CHEST  Findings:  Within the  left lower lobe,  continued enlargement of the rounded centrally necrotic mass most consistent with disease progression. Lesion measures 3.8 x 4.0 cm compared to 3.4 x 2.9 cm on 01/16/2029.  There consolidation and air bronchograms surrounding the  mass which are similar to comparison exam.  Extensive central lobular emphysema in upper lobes.  Within the right upper lobe, 4 mm nodule (image 32) is similar to 3 mm on prior.  No axillary or supraclavicular lymphadenopathy.  There is a port in the right chest wall.  No mediastinal or hilar lymphadenopathy.  No pericardial fluid.  IMPRESSION:  1.  Interval enlargement of low density mass within the left lower lobe consistent with tumor progression. 2.  Stable right upper lobe pulmonary nodule.  CT ABDOMEN AND PELVIS  Findings:  No focal hepatic lesion.  The  gallbladder, pancreas, spleen, adrenal glands, and kidneys are normal.  The stomach, small bowel, appendix, cecum are normal.  The colon and rectosigmoid colon are normal.  Abdominal aorta is normal caliber.  No retroperitoneal periportal lymphadenopathy.  No mesenteric disease.  Prostate gland bladder normal.  No pelvic lymphadenopathy. Review of  bone windows demonstrates no aggressive osseous lesions.  IMPRESSION: No evidence metastasis in the abdomen or pelvis.   Original Report Authenticated By: Genevive Bi, M.D.    Ct Chest Wo Contrast  06/06/2012  *RADIOLOGY REPORT*  Clinical Data:  Lung cancer with recurrence  CT CHEST, ABDOMEN AND PELVIS WITHOUT CONTRAST  Technique:  Multidetector CT imaging of the chest, abdomen and pelvis was performed following the standard protocol without IV contrast.  Comparison:  CT 01/17/2012  CT CHEST  Findings:  Within the left lower lobe,  continued enlargement of the rounded centrally necrotic mass most consistent with disease progression. Lesion measures 3.8 x 4.0 cm compared to 3.4 x 2.9 cm on 01/16/2029.  There consolidation and air bronchograms surrounding the  mass which are similar to comparison exam.  Extensive central lobular emphysema in upper lobes.  Within the right upper lobe, 4 mm nodule (image 32) is similar to 3 mm on prior.  No axillary or supraclavicular lymphadenopathy.  There is a port in the right chest wall.  No mediastinal or hilar lymphadenopathy.  No pericardial fluid.  IMPRESSION:  1.  Interval enlargement of low density mass within the left lower lobe consistent with tumor progression. 2.  Stable right upper lobe pulmonary nodule.  CT ABDOMEN AND PELVIS  Findings:  No focal hepatic lesion.  The gallbladder, pancreas, spleen, adrenal glands, and kidneys are normal.  The stomach, small bowel, appendix, cecum are normal.  The colon and rectosigmoid colon are normal.  Abdominal aorta is normal caliber.  No retroperitoneal periportal lymphadenopathy.   No mesenteric disease.  Prostate gland bladder normal.  No pelvic lymphadenopathy. Review of  bone windows demonstrates no aggressive osseous lesions.  IMPRESSION: No evidence metastasis in the abdomen or pelvis.   Original Report Authenticated By: Genevive Bi, M.D.    Impression:  The patient is recovering from the effects of radiation.  His followup chest CT looks relatively stable with some mild enlargement on the axial plane and possible reduction on the coronal plane. He does have postradiation soft tissue density with air bronchograms surrounding the target suggesting radiation change.  Plan:  The patient will continue to follow under the care of Dr. Arbutus Ped and returned to our office on an as-needed basis.  _____________________________________  Artist Pais Kathrynn Running, M.D.

## 2012-06-18 ENCOUNTER — Telehealth: Payer: Self-pay | Admitting: *Deleted

## 2012-06-18 MED ORDER — ONDANSETRON HCL 8 MG PO TABS: 8.0000 mg | ORAL_TABLET | Freq: Three times a day (TID) | ORAL | Status: DC | PRN

## 2012-06-18 NOTE — Telephone Encounter (Signed)
Pt's wife called stating that he started vomiting last night and he has taken compazine with no relief.  He also is c/o no appetite.  Pt does not have nausea associated with his vomiting or lack of appetite.  Pt's wife has started giving pt boost.  Per Dr Donnald Garre, okay to call in rx for zofran 8mg  every 8hrprn.  Encouraged pt's wife to increase fluid intake and have pt to take ensure, boost, or protein powder in milkshakes to help with caloric intake.  Will make nutrition consult.  They are to call back with any further issues.  SLJ

## 2012-06-19 ENCOUNTER — Other Ambulatory Visit: Payer: Self-pay | Admitting: *Deleted

## 2012-06-19 ENCOUNTER — Ambulatory Visit: Payer: Medicare Other | Admitting: Nutrition

## 2012-06-19 ENCOUNTER — Ambulatory Visit (HOSPITAL_BASED_OUTPATIENT_CLINIC_OR_DEPARTMENT_OTHER): Payer: Medicare Other

## 2012-06-19 ENCOUNTER — Other Ambulatory Visit (HOSPITAL_BASED_OUTPATIENT_CLINIC_OR_DEPARTMENT_OTHER): Payer: Medicare Other | Admitting: Lab

## 2012-06-19 DIAGNOSIS — C3490 Malignant neoplasm of unspecified part of unspecified bronchus or lung: Secondary | ICD-10-CM

## 2012-06-19 DIAGNOSIS — E860 Dehydration: Secondary | ICD-10-CM

## 2012-06-19 DIAGNOSIS — R110 Nausea: Secondary | ICD-10-CM

## 2012-06-19 DIAGNOSIS — C3430 Malignant neoplasm of lower lobe, unspecified bronchus or lung: Secondary | ICD-10-CM

## 2012-06-19 LAB — CBC WITH DIFFERENTIAL/PLATELET
Basophils Absolute: 0 10*3/uL (ref 0.0–0.1)
Eosinophils Absolute: 0 10*3/uL (ref 0.0–0.5)
HCT: 27.2 % — ABNORMAL LOW (ref 38.4–49.9)
HGB: 9.1 g/dL — ABNORMAL LOW (ref 13.0–17.1)
MCH: 29.1 pg (ref 27.2–33.4)
MCV: 86.9 fL (ref 79.3–98.0)
MONO%: 3.9 % (ref 0.0–14.0)
NEUT#: 5.2 10*3/uL (ref 1.5–6.5)
NEUT%: 89.4 % — ABNORMAL HIGH (ref 39.0–75.0)
RDW: 16.1 % — ABNORMAL HIGH (ref 11.0–14.6)
lymph#: 0.4 10*3/uL — ABNORMAL LOW (ref 0.9–3.3)

## 2012-06-19 LAB — COMPREHENSIVE METABOLIC PANEL (CC13)
ALT: 19 U/L (ref 0–55)
AST: 18 U/L (ref 5–34)
Creatinine: 2 mg/dL — ABNORMAL HIGH (ref 0.7–1.3)
Total Bilirubin: 1.2 mg/dL (ref 0.20–1.20)

## 2012-06-19 MED ORDER — SODIUM CHLORIDE 0.9 % IV SOLN
1000.0000 mL | Freq: Once | INTRAVENOUS | Status: DC
  Administered 2012-06-19: 1000 mL via INTRAVENOUS

## 2012-06-19 MED ORDER — ONDANSETRON 8 MG/50ML IVPB (CHCC)
8.0000 mg | Freq: Once | INTRAVENOUS | Status: DC
  Administered 2012-06-19: 8 mg via INTRAVENOUS

## 2012-06-19 MED ORDER — HEPARIN SOD (PORK) LOCK FLUSH 100 UNIT/ML IV SOLN
500.0000 [IU] | Freq: Once | INTRAVENOUS | Status: AC
  Administered 2012-06-19: 500 [IU] via INTRAVENOUS
  Filled 2012-06-19: qty 5

## 2012-06-19 MED ORDER — SODIUM CHLORIDE 0.9 % IJ SOLN
10.0000 mL | INTRAMUSCULAR | Status: DC | PRN
  Administered 2012-06-19: 10 mL via INTRAVENOUS
  Filled 2012-06-19: qty 10

## 2012-06-19 NOTE — Patient Instructions (Signed)
Dehydration, Adult Dehydration is when you lose more fluids from the body than you take in. Vital organs like the kidneys, brain, and heart cannot function without a proper amount of fluids and salt. Any loss of fluids from the body can cause dehydration.  CAUSES   Vomiting.  Diarrhea.  Excessive sweating.  Excessive urine output.  Fever. SYMPTOMS  Mild dehydration  Thirst.  Dry lips.  Slightly dry mouth. Moderate dehydration  Very dry mouth.  Sunken eyes.  Skin does not bounce back quickly when lightly pinched and released.  Dark urine and decreased urine production.  Decreased tear production.  Headache. Severe dehydration  Very dry mouth.  Extreme thirst.  Rapid, weak pulse (more than 100 beats per minute at rest).  Cold hands and feet.  Not able to sweat in spite of heat and temperature.  Rapid breathing.  Blue lips.  Confusion and lethargy.  Difficulty being awakened.  Minimal urine production.  No tears. DIAGNOSIS  Your caregiver will diagnose dehydration based on your symptoms and your exam. Blood and urine tests will help confirm the diagnosis. The diagnostic evaluation should also identify the cause of dehydration. TREATMENT  Treatment of mild or moderate dehydration can often be done at home by increasing the amount of fluids that you drink. It is best to drink small amounts of fluid more often. Drinking too much at one time can make vomiting worse. Refer to the home care instructions below. Severe dehydration needs to be treated at the hospital where you will probably be given intravenous (IV) fluids that contain water and electrolytes. HOME CARE INSTRUCTIONS   Ask your caregiver about specific rehydration instructions.  Drink enough fluids to keep your urine clear or pale yellow.  Drink small amounts frequently if you have nausea and vomiting.  Eat as you normally do.  Avoid:  Foods or drinks high in sugar.  Carbonated  drinks.  Juice.  Extremely hot or cold fluids.  Drinks with caffeine.  Fatty, greasy foods.  Alcohol.  Tobacco.  Overeating.  Gelatin desserts.  Wash your hands well to avoid spreading bacteria and viruses.  Only take over-the-counter or prescription medicines for pain, discomfort, or fever as directed by your caregiver.  Ask your caregiver if you should continue all prescribed and over-the-counter medicines.  Keep all follow-up appointments with your caregiver. SEEK MEDICAL CARE IF:  You have abdominal pain and it increases or stays in one area (localizes).  You have a rash, stiff neck, or severe headache.  You are irritable, sleepy, or difficult to awaken.  You are weak, dizzy, or extremely thirsty. SEEK IMMEDIATE MEDICAL CARE IF:   You are unable to keep fluids down or you get worse despite treatment.  You have frequent episodes of vomiting or diarrhea.  You have blood or green matter (bile) in your vomit.  You have blood in your stool or your stool looks black and tarry.  You have not urinated in 6 to 8 hours, or you have only urinated a small amount of very dark urine.  You have a fever.  You faint. MAKE SURE YOU:   Understand these instructions.  Will watch your condition.  Will get help right away if you are not doing well or get worse. Document Released: 02/28/2005 Document Revised: 05/23/2011 Document Reviewed: 10/18/2010 [employer] Patient Information 2013 [employer].  

## 2012-06-19 NOTE — Progress Notes (Signed)
Pt in for weekly lab work, BP 77/50.  Pt is still dry heaving at home, not eating, only drinking.  Per Tiana Loft, okay to give 1 L IVF with 8mg  zofran.  Pt verbalized understanding regarding the plan.  SLJ

## 2012-06-19 NOTE — Progress Notes (Signed)
This is a 77 year old male patient diagnosed with lung cancer status post chemoradiation therapy.  Past medical history includes COPD, hypercholesterolemia, and hypertension.  Medications include Decadron, Amaryl, Zofran, Actos, Compazine, and Januvia.  Labs were reviewed.  Height: 5 feet 10-1/2 inches. Weight: 199.3 pounds. Usual body weight 215.9 pounds in December 2013. BMI: 28.18.  Patient reports that recent chemotherapy caused nausea and dry heaving. Patient denies vomiting food or liquids. He does have decreased appetite. He is drinking boost to supplement his oral intake. His weight has been stable approximately one month.  Nutrition diagnosis: Inadequate oral intake related to diagnosis of lung cancer and associated treatments as evidenced by 8 percent weight loss in 3 and half months.  Intervention: I have educated patient and wife on strategies for eating with nausea. I've encouraged him to take nausea medications as prescribed. I've encouraged increased fluids as patient seems to tolerate these quite well. I've recommended patient increase boost when he is unable to consume foods. We discussed strategies for oral intake with nausea. Patient was encouraged to eat small, frequent, bland meals. Goal of weight maintenance discussed. Teach back method used. Fact sheets provided.  Monitoring, evaluation, goals: Patient will tolerate adequate calories and protein to promote weight maintenance.   Next visit: Tuesday, April 22 during chemotherapy.

## 2012-06-21 ENCOUNTER — Other Ambulatory Visit: Payer: Self-pay | Admitting: *Deleted

## 2012-06-21 ENCOUNTER — Ambulatory Visit (HOSPITAL_BASED_OUTPATIENT_CLINIC_OR_DEPARTMENT_OTHER): Payer: Medicare Other

## 2012-06-21 ENCOUNTER — Telehealth: Payer: Self-pay | Admitting: *Deleted

## 2012-06-21 DIAGNOSIS — R197 Diarrhea, unspecified: Secondary | ICD-10-CM

## 2012-06-21 DIAGNOSIS — C3430 Malignant neoplasm of lower lobe, unspecified bronchus or lung: Secondary | ICD-10-CM

## 2012-06-21 MED ORDER — METHYLPREDNISOLONE 4 MG PO KIT: PACK | ORAL | Status: DC

## 2012-06-21 MED ORDER — SODIUM CHLORIDE 0.9 % IV SOLN
Freq: Once | INTRAVENOUS | Status: AC
  Administered 2012-06-21: 14:00:00 via INTRAVENOUS

## 2012-06-21 NOTE — Patient Instructions (Addendum)
Dehydration, Adult Dehydration is when you lose more fluids from the body than you take in. Vital organs like the kidneys, brain, and heart cannot function without a proper amount of fluids and salt. Any loss of fluids from the body can cause dehydration.  CAUSES   Vomiting.  Diarrhea.  Excessive sweating.  Excessive urine output.  Fever. SYMPTOMS  Mild dehydration  Thirst.  Dry lips.  Slightly dry mouth. Moderate dehydration  Very dry mouth.  Sunken eyes.  Skin does not bounce back quickly when lightly pinched and released.  Dark urine and decreased urine production.  Decreased tear production.  Headache. Severe dehydration  Very dry mouth.  Extreme thirst.  Rapid, weak pulse (more than 100 beats per minute at rest).  Cold hands and feet.  Not able to sweat in spite of heat and temperature.  Rapid breathing.  Blue lips.  Confusion and lethargy.  Difficulty being awakened.  Minimal urine production.  No tears. DIAGNOSIS  Your caregiver will diagnose dehydration based on your symptoms and your exam. Blood and urine tests will help confirm the diagnosis. The diagnostic evaluation should also identify the cause of dehydration. TREATMENT  Treatment of mild or moderate dehydration can often be done at home by increasing the amount of fluids that you drink. It is best to drink small amounts of fluid more often. Drinking too much at one time can make vomiting worse. Refer to the home care instructions below. Severe dehydration needs to be treated at the hospital where you will probably be given intravenous (IV) fluids that contain water and electrolytes. HOME CARE INSTRUCTIONS   Ask your caregiver about specific rehydration instructions.  Drink enough fluids to keep your urine clear or pale yellow.  Drink small amounts frequently if you have nausea and vomiting.  Eat as you normally do.  Avoid:  Foods or drinks high in sugar.  Carbonated  drinks.  Juice.  Extremely hot or cold fluids.  Drinks with caffeine.  Fatty, greasy foods.  Alcohol.  Tobacco.  Overeating.  Gelatin desserts.  Wash your hands well to avoid spreading bacteria and viruses.  Only take over-the-counter or prescription medicines for pain, discomfort, or fever as directed by your caregiver.  Ask your caregiver if you should continue all prescribed and over-the-counter medicines.  Keep all follow-up appointments with your caregiver. SEEK MEDICAL CARE IF:  You have abdominal pain and it increases or stays in one area (localizes).  You have a rash, stiff neck, or severe headache.  You are irritable, sleepy, or difficult to awaken.  You are weak, dizzy, or extremely thirsty. SEEK IMMEDIATE MEDICAL CARE IF:   You are unable to keep fluids down or you get worse despite treatment.  You have frequent episodes of vomiting or diarrhea.  You have blood or green matter (bile) in your vomit.  You have blood in your stool or your stool looks black and tarry.  You have not urinated in 6 to 8 hours, or you have only urinated a small amount of very dark urine.  You have a fever.  You faint. MAKE SURE YOU:   Understand these instructions.  Will watch your condition.  Will get help right away if you are not doing well or get worse. Document Released: 02/28/2005 Document Revised: 05/23/2011 Document Reviewed: 10/18/2010 [employer] Patient Information 2013 [employer].  

## 2012-06-21 NOTE — Telephone Encounter (Signed)
Pt's wife called stating that pt is still not eating, only drinking liquids.  He has also started having diarrhea.  Instructions given regarding taking imodium.  He has had 2 loose stools.  Per Dr Donnald Garre, okay to give 1 L IVF today and call in a medrol dose pack.  Pt's wife verbalized understanding and is aware of the time of appt today.  SLJ

## 2012-06-21 NOTE — Progress Notes (Signed)
1526- Pt reports sharp pain in the middle of his chest at sternum, radiating upwards.  Denies SOB, pain in arm shoulder or neck,  heaviness or pressure in chest.  VS stable - see [facility].   Notified Tiana Loft, [place].    [phone] - Per [facility] [place] - patient can be dc'd home and told to go immediately to ED for evaluation if there is any recurrence of the pain.  Patient denies any additional pain and verbalized understanding of instructions.

## 2012-06-26 ENCOUNTER — Other Ambulatory Visit (HOSPITAL_BASED_OUTPATIENT_CLINIC_OR_DEPARTMENT_OTHER): Payer: Medicare Other | Admitting: Lab

## 2012-06-26 DIAGNOSIS — C3430 Malignant neoplasm of lower lobe, unspecified bronchus or lung: Secondary | ICD-10-CM

## 2012-06-26 LAB — COMPREHENSIVE METABOLIC PANEL (CC13)
ALT: 15 U/L (ref 0–55)
CO2: 23 mEq/L (ref 22–29)
Calcium: 8.8 mg/dL (ref 8.4–10.4)
Chloride: 98 mEq/L (ref 98–107)
Potassium: 3.9 mEq/L (ref 3.5–5.1)
Sodium: 133 mEq/L — ABNORMAL LOW (ref 136–145)
Total Protein: 6.7 g/dL (ref 6.4–8.3)

## 2012-06-26 LAB — CBC WITH DIFFERENTIAL/PLATELET
BASO%: 0.5 % (ref 0.0–2.0)
EOS%: 0.2 % (ref 0.0–7.0)
HCT: 25.3 % — ABNORMAL LOW (ref 38.4–49.9)
LYMPH%: 6.2 % — ABNORMAL LOW (ref 14.0–49.0)
MCH: 30.4 pg (ref 27.2–33.4)
MCHC: 34.7 g/dL (ref 32.0–36.0)
MCV: 87.6 fL (ref 79.3–98.0)
MONO#: 0.5 10*3/uL (ref 0.1–0.9)
NEUT%: 87.4 % — ABNORMAL HIGH (ref 39.0–75.0)
Platelets: 116 10*3/uL — ABNORMAL LOW (ref 140–400)

## 2012-07-03 ENCOUNTER — Encounter (HOSPITAL_COMMUNITY)
Admission: RE | Admit: 2012-07-03 | Discharge: 2012-07-03 | Disposition: A | Discharge status: Home / Self Care | Payer: Medicare Other | Source: Ambulatory Visit | Attending: Internal Medicine | Admitting: Internal Medicine

## 2012-07-03 ENCOUNTER — Ambulatory Visit (HOSPITAL_BASED_OUTPATIENT_CLINIC_OR_DEPARTMENT_OTHER): Payer: Medicare Other | Admitting: Internal Medicine

## 2012-07-03 ENCOUNTER — Other Ambulatory Visit (HOSPITAL_BASED_OUTPATIENT_CLINIC_OR_DEPARTMENT_OTHER): Payer: Medicare Other

## 2012-07-03 ENCOUNTER — Other Ambulatory Visit: Payer: Self-pay | Admitting: *Deleted

## 2012-07-03 ENCOUNTER — Ambulatory Visit (HOSPITAL_BASED_OUTPATIENT_CLINIC_OR_DEPARTMENT_OTHER): Payer: Medicare Other

## 2012-07-03 ENCOUNTER — Ambulatory Visit: Payer: Medicare Other | Admitting: Nutrition

## 2012-07-03 ENCOUNTER — Encounter: Payer: Self-pay | Admitting: Internal Medicine

## 2012-07-03 ENCOUNTER — Telehealth: Payer: Self-pay | Admitting: Internal Medicine

## 2012-07-03 DIAGNOSIS — C3490 Malignant neoplasm of unspecified part of unspecified bronchus or lung: Secondary | ICD-10-CM

## 2012-07-03 DIAGNOSIS — T451X5A Adverse effect of antineoplastic and immunosuppressive drugs, initial encounter: Secondary | ICD-10-CM

## 2012-07-03 DIAGNOSIS — C3430 Malignant neoplasm of lower lobe, unspecified bronchus or lung: Secondary | ICD-10-CM

## 2012-07-03 DIAGNOSIS — Z5111 Encounter for antineoplastic chemotherapy: Secondary | ICD-10-CM

## 2012-07-03 DIAGNOSIS — D6481 Anemia due to antineoplastic chemotherapy: Secondary | ICD-10-CM | POA: Insufficient documentation

## 2012-07-03 LAB — COMPREHENSIVE METABOLIC PANEL (CC13)
ALT: 22 U/L (ref 0–55)
AST: 12 U/L (ref 5–34)
Alkaline Phosphatase: 80 U/L (ref 40–150)
Calcium: 9.5 mg/dL (ref 8.4–10.4)
Chloride: 99 mEq/L (ref 98–107)
Creatinine: 1.6 mg/dL — ABNORMAL HIGH (ref 0.7–1.3)

## 2012-07-03 LAB — CBC WITH DIFFERENTIAL/PLATELET
BASO%: 0.2 % (ref 0.0–2.0)
EOS%: 0 % (ref 0.0–7.0)
LYMPH%: 5.2 % — ABNORMAL LOW (ref 14.0–49.0)
MCH: 27.6 pg (ref 27.2–33.4)
MCHC: 31.2 g/dL — ABNORMAL LOW (ref 32.0–36.0)
MONO#: 0.2 10*3/uL (ref 0.1–0.9)
Platelets: 206 10*3/uL (ref 140–400)
RBC: 2.94 10*6/uL — ABNORMAL LOW (ref 4.20–5.82)
WBC: 9.8 10*3/uL (ref 4.0–10.3)
lymph#: 0.5 10*3/uL — ABNORMAL LOW (ref 0.9–3.3)
nRBC: 0 % (ref 0–0)

## 2012-07-03 LAB — PREPARE RBC (CROSSMATCH)

## 2012-07-03 MED ORDER — ONDANSETRON 8 MG/50ML IVPB (CHCC)
8.0000 mg | Freq: Once | INTRAVENOUS | Status: AC
  Administered 2012-07-03: 8 mg via INTRAVENOUS

## 2012-07-03 MED ORDER — SODIUM CHLORIDE 0.9 % IV SOLN
Freq: Once | INTRAVENOUS | Status: AC
  Administered 2012-07-03: 10:00:00 via INTRAVENOUS

## 2012-07-03 MED ORDER — DEXAMETHASONE SODIUM PHOSPHATE 10 MG/ML IJ SOLN
10.0000 mg | Freq: Once | INTRAMUSCULAR | Status: AC
  Administered 2012-07-03: 10 mg via INTRAVENOUS

## 2012-07-03 MED ORDER — SODIUM CHLORIDE 0.9 % IV SOLN
65.0000 mg/m2 | Freq: Once | INTRAVENOUS | Status: AC
  Administered 2012-07-03: 140 mg via INTRAVENOUS
  Filled 2012-07-03: qty 14

## 2012-07-03 MED ORDER — HEPARIN SOD (PORK) LOCK FLUSH 100 UNIT/ML IV SOLN
500.0000 [IU] | Freq: Once | INTRAVENOUS | Status: AC | PRN
  Administered 2012-07-03: 500 [IU]
  Filled 2012-07-03: qty 5

## 2012-07-03 MED ORDER — SODIUM CHLORIDE 0.9 % IJ SOLN
10.0000 mL | INTRAMUSCULAR | Status: DC | PRN
  Administered 2012-07-03: 10 mL
  Filled 2012-07-03: qty 10

## 2012-07-03 NOTE — Progress Notes (Signed)
Patient reports nausea, vomiting, and diarrhea after his last chemotherapy. His weight declined to 182.3 pounds April 22nd from 199.3 pounds April 3. Patient reports he continues to try to drink boost +3-4 daily. He has early satiety. Patient has experienced 9% weight loss in 3 weeks.  Nutrition diagnosis: Inadequate oral intake continues.  Intervention: I've educated patient on ways to add calories to foods in his eating without increasing volume. I've recommended patient increase boost + to 4 times a day daily. I've educated him again on strategies for eating if continued nausea vomiting and diarrhea.  Monitoring, evaluation, goals: Patient will tolerate increased calories and protein in 6 mini-meals or snacks daily along with oral nutrition supplements 4 times a day to prevent further weight loss.  Next visit: Tuesday, May 13, during chemotherapy.

## 2012-07-03 NOTE — Progress Notes (Signed)
[facility] [phone]   [phone]  OFFICE PROGRESS NOTE  [name], MD [address] [place] [address]  PRINCIPAL DIAGNOSIS: Stage IIIB/IV non-small cell lung cancer, squamous cell carcinoma diagnosed in June 2012.   PRIOR THERAPY:  1) Status post a course of concurrent chemoradiation with weekly carboplatin and paclitaxel. Last dose of chemotherapy was given on November 01, 2010.  2) Systemic chemotherapy with carboplatin for an AUC of 5 given on day 1 and gemcitabine 1000 mg per meter square given on days 1 and 8 every 3 weeks status post 1 cycle. However due to thrombocytopenia from cycle 2 forward he will proceed with carboplatin for an AUC of 5 given on day 1 and gemcitabine at 800 mg per meter squared given on days 1 and 8 every 3 weeks. Status post 6 cycles with evidence for disease progression after cycle #6.  3) Palliative radiotherapy to the enlarging left lower lobe lung mass under the care of Dr. Kathrynn Running.   CURRENT THERAPY: Systemic chemotherapy with docetaxel 75 mg/M2 every 3 weeks with Neulasta support, status post 1 cycle, first cycle was started on 06/12/2012.   INTERVAL HISTORY: [name] 77 y.o. male returns to the clinic today for followup visit accompanied by his wife. The patient has significant fatigue and weakness as well as mild nausea and vomiting and diarrhea after the first cycle of his chemotherapy especially after the Neulasta injection. He denied having any significant chest pain or shortness breath, no cough or hemoptysis. He also started having mild peripheral neuropathy especially in the fingers. He is here today to start cycle #2 of his chemotherapy.  MEDICAL HISTORY: Past Medical History  Diagnosis Date  . Lung cancer 2012    recurrence 12/2011  . Diabetes mellitus   . HTN (hypertension)   . High cholesterol   . Hx of radiation therapy 09/22/10 - 11/05/10    LLL lung  . History of  chemotherapy   . History of radiation therapy 04/24/12-05/15/12    lllung 35Gy/14dx    ALLERGIES:  is allergic to codeine and penicillins.  MEDICATIONS:  Current Outpatient Prescriptions  Medication Sig Dispense Refill  . dexamethasone (DECADRON) 4 MG tablet Take 1 tablet (4 mg total) by mouth as directed. 2 tablets BID day before, day of, day after chemotherapy.  40 tablet  1  . enalapril (VASOTEC) 5 MG tablet Take 5 mg by mouth daily.      Marland Kitchen glimepiride (AMARYL) 4 MG tablet 2 tabs daily with breakfast      . ibuprofen (ADVIL,MOTRIN) 400 MG tablet Take 400 mg by mouth every 8 (eight) hours as needed for pain.      Marland Kitchen latanoprost (XALATAN) 0.005 % ophthalmic solution Place 1 drop into both eyes at bedtime.       Marland Kitchen loratadine (CLARITIN) 10 MG tablet Take 10 mg by mouth daily.      Marland Kitchen lovastatin (MEVACOR) 20 MG tablet Take 20 mg by mouth at bedtime.      . methylPREDNISolone (MEDROL, PAK,) 4 MG tablet follow package directions  21 tablet  0  . NON FORMULARY Pt is currently receiving CHemotherapy      . ondansetron (ZOFRAN) 8 MG tablet Take 1 tablet (8 mg total) by mouth every 8 (eight) hours as needed (as needed for nausea and vomiting).  30 tablet  1  . pioglitazone (ACTOS) 30 MG tablet Take 30 mg by mouth daily.       Marland Kitchen  prochlorperazine (COMPAZINE) 10 MG tablet Take 1 tablet (10 mg total) by mouth every 6 (six) hours as needed.  30 tablet  0  . sitaGLIPtin (JANUVIA) 100 MG tablet Take 100 mg by mouth daily.      Marland Kitchen tiotropium (SPIRIVA HANDIHALER) 18 MCG inhalation capsule Place 1 capsule (18 mcg total) into inhaler and inhale daily.  30 capsule  6   No current facility-administered medications for this visit.   Facility-Administered Medications Ordered in Other Visits  Medication Dose Route Frequency Provider Last Rate Last Dose  . sodium chloride 0.9 % injection 10 mL  10 mL Intracatheter PRN Si Gaul, MD   10 mL at 11/24/11 1637    SURGICAL HISTORY:  Past Surgical History    Procedure Laterality Date  . Tonsillectomy    . Gastrostomy tube placement  2011    has been removed  . Portacath placement  08/2010    REVIEW OF SYSTEMS:  A comprehensive review of systems was negative except for: Constitutional: positive for fatigue and weight loss Respiratory: positive for dyspnea on exertion Gastrointestinal: positive for diarrhea and nausea Musculoskeletal: positive for muscle weakness Neurological: positive for paresthesia   PHYSICAL EXAMINATION: General appearance: alert, cooperative, fatigued and no distress Head: Normocephalic, without obvious abnormality, atraumatic Neck: no adenopathy Lymph nodes: Cervical, supraclavicular, and axillary nodes normal. Resp: clear to auscultation bilaterally Cardio: regular rate and rhythm, S1, S2 normal, no murmur, click, rub or gallop GI: soft, non-tender; bowel sounds normal; no masses,  no organomegaly Extremities: extremities normal, atraumatic, no cyanosis or edema Neurologic: Alert and oriented X 3, normal strength and tone. Normal symmetric reflexes. Normal coordination and gait  ECOG PERFORMANCE STATUS: 1 - Symptomatic but completely ambulatory  Blood pressure 110/65, pulse 98, temperature 96.7 F (35.9 C), temperature source Oral, resp. rate 18, height 5' 10.5" (1.791 m), weight 182 lb 4.8 oz (82.691 kg).  LABORATORY DATA: Lab Results  Component Value Date   WBC 9.8 07/03/2012   HGB 8.1* 07/03/2012   HCT 26.0* 07/03/2012   MCV 88.4 07/03/2012   PLT 206 07/03/2012      Chemistry      Component Value Date/Time   NA 133* 06/26/2012 0839   NA 134 10/19/2011 0817   NA 138 12/09/2010 0531   K 3.9 06/26/2012 0839   K 4.5 10/19/2011 0817   K 4.0 12/09/2010 0531   CL 98 06/26/2012 0839   CL 99 10/19/2011 0817   CL 103 12/09/2010 0531   CO2 23 06/26/2012 0839   CO2 27 10/19/2011 0817   CO2 27 12/09/2010 0531   BUN 26.5* 06/26/2012 0839   BUN 13 10/19/2011 0817   BUN 12 12/09/2010 0531   CREATININE 1.3 06/26/2012 0839    CREATININE 1.4* 10/19/2011 0817   CREATININE 1.01 12/09/2010 0531      Component Value Date/Time   CALCIUM 8.8 06/26/2012 0839   CALCIUM 9.3 10/19/2011 0817   CALCIUM 9.6 12/09/2010 0531   ALKPHOS 82 06/26/2012 0839   ALKPHOS 48 10/19/2011 0817   ALKPHOS 73 12/08/2010 0600   AST 16 06/26/2012 0839   AST 19 10/19/2011 0817   AST 17 12/08/2010 0600   ALT 15 06/26/2012 0839   ALT 15 12/08/2010 0600   BILITOT 0.69 06/26/2012 0839   BILITOT 0.90 10/19/2011 0817   BILITOT 0.4 12/08/2010 0600       ASSESSMENT: This is a very pleasant 77 years old white male with metastatic non-small cell lung cancer currently on systemic chemotherapy with  single agent docetaxel status post 1 cycle complicated by mild nausea and vomiting as well as diarrhea and fatigue after the Neulasta injection.   PLAN: I had a lengthy discussion with the patient today about his condition. I recommended for him to continue with his current chemotherapy but I would reduce the dose of docetaxel to 65 mg/M2 every 3 weeks with Neulasta support. I discussed the patient take Claritin and premedication after the Neulasta injection. For the chemotherapy-induced anemia, I will arrange for the patient received 2 units of packed rbc's transfusion. He would come back for followup visit in 3 weeks with the next cycle of his chemotherapy. If he continues to have trouble with the same weekly dose of docetaxel I may consider switching him to weekly reduced dose without Neulasta injection. The patient and his wife agreed to the current plan. He was advised to call immediately if he has any concerning symptoms in the interval.  All questions were answered. The patient knows to call the clinic with any problems, questions or concerns. We can certainly see the patient much sooner if necessary.  I spent 15 minutes counseling the patient face to face. The total time spent in the appointment was 25 minutes.

## 2012-07-03 NOTE — Patient Instructions (Signed)
Continue chemotherapy with the second cycle today as scheduled. For the chemotherapy-induced anemia, I would consider you for packed rbc's transfusion. Followup in 3 weeks with the next cycle of chemotherapy.

## 2012-07-03 NOTE — Patient Instructions (Addendum)
[facility] Discharge Instructions for Patients Receiving Chemotherapy  Today you received the following chemotherapy agents Taxotere.  To help prevent nausea and vomiting after your treatment, we encourage you to take your nausea medication as prescribed.    If you develop nausea and vomiting that is not controlled by your nausea medication, call the clinic. If it is after clinic hours your family physician or the after hours number for the clinic or go to the Emergency Department.   BELOW ARE SYMPTOMS THAT SHOULD BE REPORTED IMMEDIATELY:  *FEVER GREATER THAN 100.5 F  *CHILLS WITH OR WITHOUT FEVER  NAUSEA AND VOMITING THAT IS NOT CONTROLLED WITH YOUR NAUSEA MEDICATION  *UNUSUAL SHORTNESS OF BREATH  *UNUSUAL BRUISING OR BLEEDING  TENDERNESS IN MOUTH AND THROAT WITH OR WITHOUT PRESENCE OF ULCERS  *URINARY PROBLEMS  *BOWEL PROBLEMS  UNUSUAL RASH Items with * indicate a potential emergency and should be followed up as soon as possible.  Please let the nurse know about any problems that you may have experienced. Feel free to call the clinic you have any questions or concerns. The clinic phone number is [phone].   I have been informed and understand all the instructions given to me. I know to contact the clinic, my physician, or go to the Emergency Department if any problems should occur. I do not have any questions at this time, but understand that I may call the clinic during office hours   should I have any questions or need assistance in obtaining follow up care.    __________________________________________  _____________  __________ Signature of Patient or Authorized Representative            Date                   Time    __________________________________________ Nurse's Signature

## 2012-07-04 ENCOUNTER — Ambulatory Visit: Payer: Medicare Other

## 2012-07-04 ENCOUNTER — Ambulatory Visit (HOSPITAL_BASED_OUTPATIENT_CLINIC_OR_DEPARTMENT_OTHER): Payer: Medicare Other

## 2012-07-04 VITALS — BP 104/55 | HR 72 | Temp 97.4°F | Resp 20

## 2012-07-04 DIAGNOSIS — D6481 Anemia due to antineoplastic chemotherapy: Secondary | ICD-10-CM

## 2012-07-04 DIAGNOSIS — T451X5A Adverse effect of antineoplastic and immunosuppressive drugs, initial encounter: Secondary | ICD-10-CM

## 2012-07-04 DIAGNOSIS — C3430 Malignant neoplasm of lower lobe, unspecified bronchus or lung: Secondary | ICD-10-CM

## 2012-07-04 DIAGNOSIS — Z5189 Encounter for other specified aftercare: Secondary | ICD-10-CM

## 2012-07-04 MED ORDER — ACETAMINOPHEN 325 MG PO TABS
650.0000 mg | ORAL_TABLET | Freq: Once | ORAL | Status: AC
  Administered 2012-07-04: 650 mg via ORAL

## 2012-07-04 MED ORDER — PEGFILGRASTIM INJECTION 6 MG/0.6ML
6.0000 mg | Freq: Once | SUBCUTANEOUS | Status: AC
  Administered 2012-07-04: 6 mg via SUBCUTANEOUS
  Filled 2012-07-04: qty 0.6

## 2012-07-04 MED ORDER — DIPHENHYDRAMINE HCL 25 MG PO CAPS
25.0000 mg | ORAL_CAPSULE | Freq: Once | ORAL | Status: AC
  Administered 2012-07-04: 25 mg via ORAL

## 2012-07-04 MED ORDER — SODIUM CHLORIDE 0.9 % IJ SOLN
10.0000 mL | INTRAMUSCULAR | Status: AC | PRN
  Administered 2012-07-04: 10 mL
  Filled 2012-07-04: qty 10

## 2012-07-04 MED ORDER — SODIUM CHLORIDE 0.9 % IV SOLN
250.0000 mL | Freq: Once | INTRAVENOUS | Status: AC
  Administered 2012-07-04: 250 mL via INTRAVENOUS

## 2012-07-04 MED ORDER — HEPARIN SOD (PORK) LOCK FLUSH 100 UNIT/ML IV SOLN
500.0000 [IU] | Freq: Every day | INTRAVENOUS | Status: AC | PRN
  Administered 2012-07-04: 500 [IU]
  Filled 2012-07-04: qty 5

## 2012-07-04 NOTE — Patient Instructions (Addendum)
Blood Transfusion Information WHAT IS A BLOOD TRANSFUSION? A transfusion is the replacement of blood or some of its parts. Blood is made up of multiple cells which provide different functions.  Red blood cells carry oxygen and are used for blood loss replacement.  White blood cells fight against infection.  Platelets control bleeding.  Plasma helps clot blood.  Other blood products are available for specialized needs, such as hemophilia or other clotting disorders. BEFORE THE TRANSFUSION  Who gives blood for transfusions?   You may be able to donate blood to be used at a later date on yourself (autologous donation).  Relatives can be asked to donate blood. This is generally not any safer than if you have received blood from a stranger. The same precautions are taken to ensure safety when a relative's blood is donated.  Healthy volunteers who are fully evaluated to make sure their blood is safe. This is blood bank blood. Transfusion therapy is the safest it has ever been in the practice of medicine. Before blood is taken from a donor, a complete history is taken to make sure that person has no history of diseases nor engages in risky social behavior (examples are intravenous drug use or sexual activity with multiple partners). The donor's travel history is screened to minimize risk of transmitting infections, such as malaria. The donated blood is tested for signs of infectious diseases, such as HIV and hepatitis. The blood is then tested to be sure it is compatible with you in order to minimize the chance of a transfusion reaction. If you or a relative donates blood, this is often done in anticipation of surgery and is not appropriate for emergency situations. It takes many days to process the donated blood. RISKS AND COMPLICATIONS Although transfusion therapy is very safe and saves many lives, the main dangers of transfusion include:   Getting an infectious disease.  Developing a  transfusion reaction. This is an allergic reaction to something in the blood you were given. Every precaution is taken to prevent this. The decision to have a blood transfusion has been considered carefully by your caregiver before blood is given. Blood is not given unless the benefits outweigh the risks. AFTER THE TRANSFUSION  Right after receiving a blood transfusion, you will usually feel much better and more energetic. This is especially true if your red blood cells have gotten low (anemic). The transfusion raises the level of the red blood cells which carry oxygen, and this usually causes an energy increase.  The nurse administering the transfusion will monitor you carefully for complications. HOME CARE INSTRUCTIONS  No special instructions are needed after a transfusion. You may find your energy is better. Speak with your caregiver about any limitations on activity for underlying diseases you may have. SEEK MEDICAL CARE IF:   Your condition is not improving after your transfusion.  You develop redness or irritation at the intravenous (IV) site. SEEK IMMEDIATE MEDICAL CARE IF:  Any of the following symptoms occur over the next 12 hours:  Shaking chills.  You have a temperature by mouth above 102 F (38.9 C), not controlled by medicine.  Chest, back, or muscle pain.  People around you feel you are not acting correctly or are confused.  Shortness of breath or difficulty breathing.  Dizziness and fainting.  You get a rash or develop hives.  You have a decrease in urine output.  Your urine turns a dark color or changes to pink, red, or brown. Any of the following   symptoms occur over the next 10 days:  You have a temperature by mouth above 102 F (38.9 C), not controlled by medicine.  Shortness of breath.  Weakness after normal activity.  The white part of the eye turns yellow (jaundice).  You have a decrease in the amount of urine or are urinating less often.  Your  urine turns a dark color or changes to pink, red, or brown. Document Released: 02/26/2000 Document Revised: 05/23/2011 Document Reviewed: 10/15/2007 [facility] Patient Information 2013 [place], [place]. Pegfilgrastim injection What is this medicine? PEGFILGRASTIM (peg fil GRA stim) helps the body make more white blood cells. It is used to prevent infection in people with low amounts of white blood cells following cancer treatment. This medicine may be used for other purposes; ask your health care provider or pharmacist if you have questions. What should I tell my health care provider before I take this medicine? They need to know if you have any of these conditions: -sickle cell disease -an unusual or allergic reaction to pegfilgrastim, filgrastim, E.coli protein, other medicines, foods, dyes, or preservatives -pregnant or trying to get pregnant -breast-feeding How should I use this medicine? This medicine is for injection under the skin. It is usually given by a health care professional in a hospital or clinic setting. If you get this medicine at home, you will be taught how to prepare and give this medicine. Do not shake this medicine. Use exactly as directed. Take your medicine at regular intervals. Do not take your medicine more often than directed. It is important that you put your used needles and syringes in a special sharps container. Do not put them in a trash can. If you do not have a sharps container, call your pharmacist or healthcare provider to get one. Talk to your pediatrician regarding the use of this medicine in children. While this drug may be prescribed for children who weigh more than 45 kg for selected conditions, precautions do apply Overdosage: If you think you have taken too much of this medicine contact a poison control center or emergency room at once. NOTE: This medicine is only for you. Do not share this medicine with others. What if I miss a dose? If you miss a dose,  take it as soon as you can. If it is almost time for your next dose, take only that dose. Do not take double or extra doses. What may interact with this medicine? -lithium -medicines for growth therapy This list may not describe all possible interactions. Give your health care provider a list of all the medicines, herbs, non-prescription drugs, or dietary supplements you use. Also tell them if you smoke, drink alcohol, or use illegal drugs. Some items may interact with your medicine. What should I watch for while using this medicine? Visit your doctor for regular check ups. You will need important blood work done while you are taking this medicine. What side effects may I notice from receiving this medicine? Side effects that you should report to your doctor or health care professional as soon as possible: -allergic reactions like skin rash, itching or hives, swelling of the face, lips, or tongue -breathing problems -fever -pain, redness, or swelling where injected -shoulder pain -stomach or side pain Side effects that usually do not require medical attention (report to your doctor or health care professional if they continue or are bothersome): -aches, pains -headache -loss of appetite -nausea, vomiting -unusually tired This list may not describe all possible side effects. Call your doctor  for medical advice about side effects. You may report side effects to FDA at 1-800-FDA-1088. Where should I keep my medicine? Keep out of the reach of children. Store in a refrigerator between 2 and 8 degrees C (36 and 46 degrees F). Do not freeze. Keep in carton to protect from light. Throw away this medicine if it is left out of the refrigerator for more than 48 hours. Throw away any unused medicine after the expiration date. NOTE: This sheet is a summary. It may not cover all possible information. If you have questions about this medicine, talk to your doctor, pharmacist, or health care provider.   2013, [employer]. (10/01/2007 3:41:44 PM)

## 2012-07-05 ENCOUNTER — Other Ambulatory Visit: Payer: Self-pay | Admitting: Certified Registered Nurse Anesthetist

## 2012-07-05 LAB — TYPE AND SCREEN

## 2012-07-10 ENCOUNTER — Other Ambulatory Visit (HOSPITAL_BASED_OUTPATIENT_CLINIC_OR_DEPARTMENT_OTHER): Payer: Medicare Other | Admitting: Lab

## 2012-07-10 DIAGNOSIS — C3490 Malignant neoplasm of unspecified part of unspecified bronchus or lung: Secondary | ICD-10-CM

## 2012-07-10 DIAGNOSIS — C3430 Malignant neoplasm of lower lobe, unspecified bronchus or lung: Secondary | ICD-10-CM

## 2012-07-10 LAB — COMPREHENSIVE METABOLIC PANEL (CC13)
Albumin: 2.7 g/dL — ABNORMAL LOW (ref 3.5–5.0)
Alkaline Phosphatase: 81 U/L (ref 40–150)
BUN: 39.6 mg/dL — ABNORMAL HIGH (ref 7.0–26.0)
CO2: 21 mEq/L — ABNORMAL LOW (ref 22–29)
Calcium: 9.2 mg/dL (ref 8.4–10.4)
Chloride: 99 mEq/L (ref 98–107)
Glucose: 330 mg/dl — ABNORMAL HIGH (ref 70–99)
Potassium: 4.8 mEq/L (ref 3.5–5.1)
Sodium: 133 mEq/L — ABNORMAL LOW (ref 136–145)
Total Protein: 6.6 g/dL (ref 6.4–8.3)

## 2012-07-10 LAB — CBC WITH DIFFERENTIAL/PLATELET
Basophils Absolute: 0 10*3/uL (ref 0.0–0.1)
Eosinophils Absolute: 0 10*3/uL (ref 0.0–0.5)
HCT: 31.5 % — ABNORMAL LOW (ref 38.4–49.9)
HGB: 10.6 g/dL — ABNORMAL LOW (ref 13.0–17.1)
MCH: 28.7 pg (ref 27.2–33.4)
MONO#: 0.2 10*3/uL (ref 0.1–0.9)
NEUT#: 7.3 10*3/uL — ABNORMAL HIGH (ref 1.5–6.5)
NEUT%: 88 % — ABNORMAL HIGH (ref 39.0–75.0)
RDW: 16.3 % — ABNORMAL HIGH (ref 11.0–14.6)
lymph#: 0.7 10*3/uL — ABNORMAL LOW (ref 0.9–3.3)

## 2012-07-11 ENCOUNTER — Telehealth: Payer: Self-pay | Admitting: Medical Oncology

## 2012-07-11 ENCOUNTER — Other Ambulatory Visit: Payer: Self-pay

## 2012-07-11 ENCOUNTER — Emergency Department (HOSPITAL_COMMUNITY): Payer: Medicare Other

## 2012-07-11 ENCOUNTER — Inpatient Hospital Stay (HOSPITAL_COMMUNITY)
Admission: EM | Admit: 2012-07-11 | Discharge: 2012-07-22 | DRG: 291 | Disposition: A | Discharge status: Home Health | Payer: Medicare Other | Attending: Internal Medicine | Admitting: Internal Medicine

## 2012-07-11 ENCOUNTER — Encounter (HOSPITAL_COMMUNITY): Payer: Self-pay | Admitting: *Deleted

## 2012-07-11 DIAGNOSIS — Z923 Personal history of irradiation: Secondary | ICD-10-CM

## 2012-07-11 DIAGNOSIS — R531 Weakness: Secondary | ICD-10-CM

## 2012-07-11 DIAGNOSIS — Z9221 Personal history of antineoplastic chemotherapy: Secondary | ICD-10-CM

## 2012-07-11 DIAGNOSIS — E119 Type 2 diabetes mellitus without complications: Secondary | ICD-10-CM | POA: Diagnosis present

## 2012-07-11 DIAGNOSIS — IMO0002 Reserved for concepts with insufficient information to code with codable children: Secondary | ICD-10-CM | POA: Diagnosis present

## 2012-07-11 DIAGNOSIS — E871 Hypo-osmolality and hyponatremia: Secondary | ICD-10-CM | POA: Diagnosis present

## 2012-07-11 DIAGNOSIS — C3490 Malignant neoplasm of unspecified part of unspecified bronchus or lung: Secondary | ICD-10-CM | POA: Diagnosis present

## 2012-07-11 DIAGNOSIS — R9431 Abnormal electrocardiogram [ECG] [EKG]: Secondary | ICD-10-CM | POA: Diagnosis present

## 2012-07-11 DIAGNOSIS — D649 Anemia, unspecified: Secondary | ICD-10-CM | POA: Diagnosis present

## 2012-07-11 DIAGNOSIS — D72829 Elevated white blood cell count, unspecified: Secondary | ICD-10-CM | POA: Diagnosis present

## 2012-07-11 DIAGNOSIS — J4489 Other specified chronic obstructive pulmonary disease: Secondary | ICD-10-CM | POA: Diagnosis present

## 2012-07-11 DIAGNOSIS — N289 Disorder of kidney and ureter, unspecified: Secondary | ICD-10-CM

## 2012-07-11 DIAGNOSIS — I5031 Acute diastolic (congestive) heart failure: Principal | ICD-10-CM

## 2012-07-11 DIAGNOSIS — E46 Unspecified protein-calorie malnutrition: Secondary | ICD-10-CM | POA: Diagnosis present

## 2012-07-11 DIAGNOSIS — E860 Dehydration: Secondary | ICD-10-CM | POA: Diagnosis present

## 2012-07-11 DIAGNOSIS — R5381 Other malaise: Secondary | ICD-10-CM | POA: Diagnosis present

## 2012-07-11 DIAGNOSIS — I5032 Chronic diastolic (congestive) heart failure: Secondary | ICD-10-CM | POA: Diagnosis not present

## 2012-07-11 DIAGNOSIS — Z79899 Other long term (current) drug therapy: Secondary | ICD-10-CM

## 2012-07-11 DIAGNOSIS — J189 Pneumonia, unspecified organism: Secondary | ICD-10-CM

## 2012-07-11 DIAGNOSIS — R627 Adult failure to thrive: Secondary | ICD-10-CM | POA: Diagnosis present

## 2012-07-11 DIAGNOSIS — R509 Fever, unspecified: Secondary | ICD-10-CM

## 2012-07-11 DIAGNOSIS — I959 Hypotension, unspecified: Secondary | ICD-10-CM | POA: Diagnosis present

## 2012-07-11 DIAGNOSIS — I509 Heart failure, unspecified: Secondary | ICD-10-CM | POA: Diagnosis present

## 2012-07-11 DIAGNOSIS — I248 Other forms of acute ischemic heart disease: Secondary | ICD-10-CM | POA: Diagnosis present

## 2012-07-11 DIAGNOSIS — B370 Candidal stomatitis: Secondary | ICD-10-CM | POA: Diagnosis present

## 2012-07-11 DIAGNOSIS — I498 Other specified cardiac arrhythmias: Secondary | ICD-10-CM | POA: Diagnosis present

## 2012-07-11 DIAGNOSIS — J449 Chronic obstructive pulmonary disease, unspecified: Secondary | ICD-10-CM | POA: Diagnosis present

## 2012-07-11 DIAGNOSIS — E7800 Pure hypercholesterolemia, unspecified: Secondary | ICD-10-CM | POA: Diagnosis present

## 2012-07-11 DIAGNOSIS — E876 Hypokalemia: Secondary | ICD-10-CM

## 2012-07-11 DIAGNOSIS — J441 Chronic obstructive pulmonary disease with (acute) exacerbation: Secondary | ICD-10-CM | POA: Diagnosis present

## 2012-07-11 DIAGNOSIS — I2489 Other forms of acute ischemic heart disease: Secondary | ICD-10-CM | POA: Diagnosis present

## 2012-07-11 LAB — CBC WITH DIFFERENTIAL/PLATELET
Basophils Relative: 0 % (ref 0–1)
Eosinophils Absolute: 0 10*3/uL (ref 0.0–0.7)
Eosinophils Relative: 0 % (ref 0–5)
HCT: 27.9 % — ABNORMAL LOW (ref 39.0–52.0)
Hemoglobin: 9.5 g/dL — ABNORMAL LOW (ref 13.0–17.0)
MCH: 29.1 pg (ref 26.0–34.0)
MCHC: 34.1 g/dL (ref 30.0–36.0)
Monocytes Absolute: 0.6 10*3/uL (ref 0.1–1.0)
Monocytes Relative: 5 % (ref 3–12)
RDW: 16.5 % — ABNORMAL HIGH (ref 11.5–15.5)

## 2012-07-11 LAB — URINALYSIS, ROUTINE W REFLEX MICROSCOPIC
Hgb urine dipstick: NEGATIVE
Protein, ur: 30 mg/dL — AB
Urobilinogen, UA: 1 mg/dL (ref 0.0–1.0)

## 2012-07-11 LAB — COMPREHENSIVE METABOLIC PANEL
AST: 24 U/L (ref 0–37)
BUN: 39 mg/dL — ABNORMAL HIGH (ref 6–23)
CO2: 23 mEq/L (ref 19–32)
Chloride: 95 mEq/L — ABNORMAL LOW (ref 96–112)
Creatinine, Ser: 1.36 mg/dL — ABNORMAL HIGH (ref 0.50–1.35)
GFR calc Af Amer: 57 mL/min — ABNORMAL LOW (ref >90)
GFR calc non Af Amer: 49 mL/min — ABNORMAL LOW (ref >90)
Glucose, Bld: 222 mg/dL — ABNORMAL HIGH (ref 70–99)
Total Bilirubin: 0.7 mg/dL (ref 0.3–1.2)

## 2012-07-11 LAB — LACTIC ACID, PLASMA: Lactic Acid, Venous: 2.2 mmol/L (ref 0.5–2.2)

## 2012-07-11 LAB — URINE MICROSCOPIC-ADD ON

## 2012-07-11 MED ORDER — SODIUM CHLORIDE 0.9 % IV BOLUS (SEPSIS)
1000.0000 mL | Freq: Once | INTRAVENOUS | Status: AC
  Administered 2012-07-11: 1000 mL via INTRAVENOUS

## 2012-07-11 MED ORDER — SODIUM CHLORIDE 0.9 % IV SOLN
1000.0000 mL | Freq: Once | INTRAVENOUS | Status: AC
  Administered 2012-07-11: 1000 mL via INTRAVENOUS

## 2012-07-11 MED ORDER — SODIUM CHLORIDE 0.9 % IV SOLN
1000.0000 mL | INTRAVENOUS | Status: DC
  Administered 2012-07-11 – 2012-07-15 (×9): 1000 mL via INTRAVENOUS

## 2012-07-11 NOTE — ED Provider Notes (Addendum)
History     CSN: [id number]  Arrival date & time 07/11/12  1705   First MD Initiated Contact with Patient 07/11/12 1744      Chief Complaint  Patient presents with  . Weakness    (Consider location/radiation/quality/duration/timing/severity/associated sxs/prior treatment) Patient is a 77 y.o. male presenting with weakness. The history is provided by the patient, the spouse and a relative.  Weakness  He is being treated for lung cancer and was started on a new chemotherapy agent four weeks ago. Several days after that, he started feeling very weak and "out of it". He received a second dose of one week ago. His weakness seems to be getting worse. One week ago, he was also given him some blood because of anemia related to his chemotherapy. He has not had fever or chills. He denies any pain anywhere. He did initially have nausea and vomiting. Currently he denies nausea. He did have blood work done yesterday but does not know what the results of that blood work was. He denies dyspnea. He denies chest pain.  Past Medical History  Diagnosis Date  . Lung cancer 2012    recurrence 12/2011  . Diabetes mellitus   . HTN (hypertension)   . High cholesterol   . Hx of radiation therapy 09/22/10 - 11/05/10    LLL lung  . History of chemotherapy   . History of radiation therapy 04/24/12-05/15/12    lllung 35Gy/14dx    Past Surgical History  Procedure Laterality Date  . Tonsillectomy    . Gastrostomy tube placement  2011    has been removed  . Portacath placement  08/2010    Family History  Problem Relation Age of Onset  . Heart disease Mother   . Heart disease Father   . Breast cancer Paternal Aunt     History  Substance Use Topics  . Smoking status: Former Smoker -- 4.00 packs/day for 34 years    Types: Cigarettes    Quit date: 03/15/1987  . Smokeless tobacco: Not on file  . Alcohol Use: No      Review of Systems  Neurological: Positive for weakness.  All other systems  reviewed and are negative.    Allergies  Codeine and Penicillins  Home Medications   Current Outpatient Rx  Name  Route  Sig  Dispense  Refill  . dexamethasone (DECADRON) 4 MG tablet   Oral   Take 1 tablet (4 mg total) by mouth as directed. 2 tablets BID day before, day of, day after chemotherapy.   40 tablet   1   . enalapril (VASOTEC) 5 MG tablet   Oral   Take 5 mg by mouth daily.         Marland Kitchen glimepiride (AMARYL) 4 MG tablet      2 tabs daily with breakfast         . ibuprofen (ADVIL,MOTRIN) 400 MG tablet   Oral   Take 400 mg by mouth every 8 (eight) hours as needed for pain.         Marland Kitchen latanoprost (XALATAN) 0.005 % ophthalmic solution   Both Eyes   Place 1 drop into both eyes at bedtime.          Marland Kitchen loratadine (CLARITIN) 10 MG tablet   Oral   Take 10 mg by mouth daily.         Marland Kitchen lovastatin (MEVACOR) 20 MG tablet   Oral   Take 20 mg by mouth at bedtime.         Marland Kitchen  NON FORMULARY      Pt is currently receiving CHemotherapy         . ondansetron (ZOFRAN) 8 MG tablet   Oral   Take 1 tablet (8 mg total) by mouth every 8 (eight) hours as needed (as needed for nausea and vomiting).   30 tablet   1   . pioglitazone (ACTOS) 30 MG tablet   Oral   Take 30 mg by mouth daily.          . prochlorperazine (COMPAZINE) 10 MG tablet   Oral   Take 1 tablet (10 mg total) by mouth every 6 (six) hours as needed.   30 tablet   0   . sitaGLIPtin (JANUVIA) 100 MG tablet   Oral   Take 100 mg by mouth daily.         Marland Kitchen tiotropium (SPIRIVA HANDIHALER) 18 MCG inhalation capsule   Inhalation   Place 1 capsule (18 mcg total) into inhaler and inhale daily.   30 capsule   6     BP 87/61  Pulse 106  Temp(Src) 98.9 F (37.2 C) (Oral)  Resp 26  SpO2 93%  Physical Exam  Nursing note and vitals reviewed.  77 year old male, who appears pale and slightly lethargic, but is in no acute distress. Vital signs are  Significant for tachycardia with heart rate 106,  and hypertension with blood pressure 87/61, and tachypnea with respiratory rate of 26. Oxygen saturation is 93%, which is normal. After coming back into a patient care room, blood pressure was repeated and was normal. Head is normocephalic and atraumatic. PERRLA, EOMI. Oropharynx is clear. Conjunctiva appear pale. Neck is nontender and supple without adenopathy or JVD. Back is nontender and there is no CVA tenderness. Lungs have diffuse inspiratory and expiratory rhonchi. Chest is nontender. Mediport is present in the right anterior chest. Heart has regular rate and rhythm without murmur. Abdomen is soft, flat, nontender without masses or hepatosplenomegaly and peristalsis is normoactive. Extremities have no cyanosis or edema, full range of motion is present. Skin is warm and dry without rash. Neurologic: Mental status is normal, cranial nerves are intact, there are no motor or sensory deficits.  ED Course  Procedures (including critical care time)  Results for orders placed during the hospital encounter of 07/11/12  CBC WITH DIFFERENTIAL      Result Value Range   WBC 12.6 (*) 4.0 - 10.5 K/uL   RBC 3.26 (*) 4.22 - 5.81 MIL/uL   Hemoglobin 9.5 (*) 13.0 - 17.0 g/dL   HCT 21.3 (*) 08.6 - 57.8 %   MCV 85.6  78.0 - 100.0 fL   MCH 29.1  26.0 - 34.0 pg   MCHC 34.1  30.0 - 36.0 g/dL   RDW 46.9 (*) 62.9 - 52.8 %   Platelets 135 (*) 150 - 400 K/uL   Neutrophils Relative 91 (*) 43 - 77 %   Neutro Abs 11.5 (*) 1.7 - 7.7 K/uL   Lymphocytes Relative 4 (*) 12 - 46 %   Lymphs Abs 0.5 (*) 0.7 - 4.0 K/uL   Monocytes Relative 5  3 - 12 %   Monocytes Absolute 0.6  0.1 - 1.0 K/uL   Eosinophils Relative 0  0 - 5 %   Eosinophils Absolute 0.0  0.0 - 0.7 K/uL   Basophils Relative 0  0 - 1 %   Basophils Absolute 0.0  0.0 - 0.1 K/uL   WBC Morphology MILD LEFT SHIFT (1-5% METAS, OCC MYELO,  OCC BANDS)     RBC Morphology POLYCHROMASIA PRESENT     Smear Review LARGE PLATELETS PRESENT    COMPREHENSIVE  METABOLIC PANEL      Result Value Range   Sodium 132 (*) 135 - 145 mEq/L   Potassium 4.3  3.5 - 5.1 mEq/L   Chloride 95 (*) 96 - 112 mEq/L   CO2 23  19 - 32 mEq/L   Glucose, Bld 222 (*) 70 - 99 mg/dL   BUN 39 (*) 6 - 23 mg/dL   Creatinine, Ser 1.61 (*) 0.50 - 1.35 mg/dL   Calcium 9.2  8.4 - 09.6 mg/dL   Total Protein 6.6  6.0 - 8.3 g/dL   Albumin 2.7 (*) 3.5 - 5.2 g/dL   AST 24  0 - 37 U/L   ALT 18  0 - 53 U/L   Alkaline Phosphatase 136 (*) 39 - 117 U/L   Total Bilirubin 0.7  0.3 - 1.2 mg/dL   GFR calc non Af Amer 49 (*) >90 mL/min   GFR calc Af Amer 57 (*) >90 mL/min  URINALYSIS, ROUTINE W REFLEX MICROSCOPIC      Result Value Range   Color, Urine AMBER (*) YELLOW   APPearance CLEAR  CLEAR   Specific Gravity, Urine 1.029  1.005 - 1.030   pH 5.0  5.0 - 8.0   Glucose, UA >1000 (*) NEGATIVE mg/dL   Hgb urine dipstick NEGATIVE  NEGATIVE   Bilirubin Urine NEGATIVE  NEGATIVE   Ketones, ur NEGATIVE  NEGATIVE mg/dL   Protein, ur 30 (*) NEGATIVE mg/dL   Urobilinogen, UA 1.0  0.0 - 1.0 mg/dL   Nitrite NEGATIVE  NEGATIVE   Leukocytes, UA NEGATIVE  NEGATIVE  LACTIC ACID, PLASMA      Result Value Range   Lactic Acid, Venous 2.2  0.5 - 2.2 mmol/L  CK      Result Value Range   Total CK 21  7 - 232 U/L  TROPONIN I      Result Value Range   Troponin I <0.30  <0.30 ng/mL  GLUCOSE, CAPILLARY      Result Value Range   Glucose-Capillary 213 (*) 70 - 99 mg/dL  URINE MICROSCOPIC-ADD ON      Result Value Range   Urine-Other MUCOUS PRESENT    TROPONIN I      Result Value Range   Troponin I <0.30  <0.30 ng/mL   Dg Chest Portable 1 View  07/11/2012  *RADIOLOGY REPORT*  Clinical Data: Weakness.  History of lung cancer.  Diabetes.  COPD.  PORTABLE CHEST - 1 VIEW  Comparison: 06/06/2012.  Findings: Left lower lobe collapse / consolidation associated with mass lesion.  Allowing for differences in technique, this appears little changed compared to 06/06/2012.  The right lung appears clear.  The  right IJ Port-A-Cath. Monitoring leads are projected over the chest. No pneumothorax.  Bilateral AC joint osteoarthritis.  IMPRESSION:  1.  No definite acute abnormality. 2.  Mass effect and consolidation at the left lung base appears similar to prior CT when allowing for differences in technique.  No definite superimposed acute abnormality.  Postobstructive pneumonia associated with left lower lobe mass merits consideration if the patient has an elevated white blood cell count.   Original Report Authenticated By: Andreas Newport, M.D.       Date: 07/11/2012  Rate: 101  Rhythm: sinus tachycardia  QRS Axis: normal  Intervals: normal  ST/T Wave abnormalities: ST depression and 1T wave inversion in the injury  lateral leads, not T wave inversions in leads 1 and 2   Conduction Disutrbances:none  Narrative Interpretation: ST and T changes worrisome for ischemia. When compared with ECG of 12/07/2010, ST and T changes are now present suggestive of ischemia.  Old EKG Reviewed: changes noted    1. Weakness   2. Hypotension   3. Anemia   4. Renal insufficiency    CRITICAL CARE Performed by: Dione Booze   Total critical care time: 40 minutes  Critical care time was exclusive of separately billable procedures and treating other patients.  Critical care was necessary to treat or prevent imminent or life-threatening deterioration.  Critical care was time spent personally by me on the following activities: development of treatment plan with patient and/or surrogate as well as nursing, discussions with consultants, evaluation of patient's response to treatment, examination of patient, obtaining history from patient or surrogate, ordering and performing treatments and interventions, ordering and review of laboratory studies, ordering and review of radiographic studies, pulse oximetry and re-evaluation of patient's condition.   MDM  Weakness of uncertain cause. His records have been reviewed and his  chemotherapy agent was Taxotere and Neulasta. Both of those can have significant weakness as side effects. Laboratory work yesterday showed hemoglobin of 10.6 following blood transfusion on April 22. Blood pressure was initially low but has come up with just be taking it. However, he probably is somewhat dehydrated and will be given IV fluids. ECG is worrisome for ischemia and cardiac markers will be checked.  Cardiac markers at come back normal. Blood pressure came up with fluids but then dropped again. He was given additional fluids with pressure again and her returning to normal. However, given hemodynamic instability, it was decided to admit the patient. Case is discussed with Dr. Kara Pacer of [facility] who agrees to admit the patient.      Dione Booze, MD 07/12/12 1610  Dione Booze, MD 07/12/12 [id number]

## 2012-07-11 NOTE — ED Notes (Signed)
Pt c/o weakness, fatigue and "out of it" since 2nd chemo on last Tuesday. Reports he is being treated for left lower lung cancer.

## 2012-07-11 NOTE — Telephone Encounter (Signed)
Wife left message that pt not doing well ,"about to pass out" . I called back and pt answered phone . His voice was weak and he said they were getting ready to go to [facility] ED. I told him to call 911 if needed and he said " i believe I can make it in [place]".

## 2012-07-11 NOTE — ED Notes (Signed)
Pt sts "I just started feeling bad this morning.  I feel like I can pass out at any time."  Sts he did not eat well yesterday or today.

## 2012-07-12 ENCOUNTER — Encounter (HOSPITAL_COMMUNITY): Payer: Self-pay | Admitting: Internal Medicine

## 2012-07-12 DIAGNOSIS — R5381 Other malaise: Secondary | ICD-10-CM | POA: Diagnosis present

## 2012-07-12 DIAGNOSIS — R9431 Abnormal electrocardiogram [ECG] [EKG]: Secondary | ICD-10-CM | POA: Diagnosis present

## 2012-07-12 DIAGNOSIS — R5383 Other fatigue: Secondary | ICD-10-CM

## 2012-07-12 DIAGNOSIS — D649 Anemia, unspecified: Secondary | ICD-10-CM

## 2012-07-12 DIAGNOSIS — R509 Fever, unspecified: Secondary | ICD-10-CM

## 2012-07-12 DIAGNOSIS — IMO0002 Reserved for concepts with insufficient information to code with codable children: Secondary | ICD-10-CM | POA: Diagnosis present

## 2012-07-12 DIAGNOSIS — E871 Hypo-osmolality and hyponatremia: Secondary | ICD-10-CM | POA: Diagnosis present

## 2012-07-12 DIAGNOSIS — E860 Dehydration: Secondary | ICD-10-CM

## 2012-07-12 DIAGNOSIS — I519 Heart disease, unspecified: Secondary | ICD-10-CM

## 2012-07-12 DIAGNOSIS — E119 Type 2 diabetes mellitus without complications: Secondary | ICD-10-CM | POA: Diagnosis present

## 2012-07-12 DIAGNOSIS — I959 Hypotension, unspecified: Secondary | ICD-10-CM | POA: Diagnosis present

## 2012-07-12 LAB — COMPREHENSIVE METABOLIC PANEL
AST: 12 U/L (ref 0–37)
CO2: 23 mEq/L (ref 19–32)
Calcium: 7.9 mg/dL — ABNORMAL LOW (ref 8.4–10.5)
Creatinine, Ser: 1.07 mg/dL (ref 0.50–1.35)
GFR calc Af Amer: 76 mL/min — ABNORMAL LOW (ref >90)
GFR calc non Af Amer: 66 mL/min — ABNORMAL LOW (ref >90)
Sodium: 135 mEq/L (ref 135–145)
Total Protein: 5.4 g/dL — ABNORMAL LOW (ref 6.0–8.3)

## 2012-07-12 LAB — TROPONIN I
Troponin I: <0.3 ng/mL (ref <0.30)
Troponin I: <0.3 ng/mL (ref <0.30)
Troponin I: <0.3 ng/mL (ref <0.30)

## 2012-07-12 LAB — MAGNESIUM: Magnesium: 1.7 mg/dL (ref 1.5–2.5)

## 2012-07-12 LAB — CBC
HCT: 24.8 % — ABNORMAL LOW (ref 39.0–52.0)
Hemoglobin: 8.2 g/dL — ABNORMAL LOW (ref 13.0–17.0)
MCH: 28.8 pg (ref 26.0–34.0)
RBC: 2.85 MIL/uL — ABNORMAL LOW (ref 4.22–5.81)

## 2012-07-12 LAB — TSH: TSH: 1.104 u[IU]/mL (ref 0.350–4.500)

## 2012-07-12 LAB — GLUCOSE, CAPILLARY
Glucose-Capillary: 112 mg/dL — ABNORMAL HIGH (ref 70–99)
Glucose-Capillary: 158 mg/dL — ABNORMAL HIGH (ref 70–99)
Glucose-Capillary: 124 mg/dL — ABNORMAL HIGH (ref 70–99)

## 2012-07-12 MED ORDER — INSULIN ASPART 100 UNIT/ML ~~LOC~~ SOLN
0.0000 [IU] | SUBCUTANEOUS | Status: DC
  Administered 2012-07-12: 1 [IU] via SUBCUTANEOUS
  Administered 2012-07-12: 2 [IU] via SUBCUTANEOUS

## 2012-07-12 MED ORDER — ACETAMINOPHEN 650 MG RE SUPP: 650.0000 mg | Freq: Four times a day (QID) | RECTAL | Status: DC | PRN

## 2012-07-12 MED ORDER — SODIUM CHLORIDE 0.9 % IJ SOLN
3.0000 mL | Freq: Two times a day (BID) | INTRAMUSCULAR | Status: DC
  Administered 2012-07-16 – 2012-07-21 (×4): 3 mL via INTRAVENOUS

## 2012-07-12 MED ORDER — INSULIN ASPART 100 UNIT/ML ~~LOC~~ SOLN
0.0000 [IU] | Freq: Three times a day (TID) | SUBCUTANEOUS | Status: DC
  Administered 2012-07-12: 1 [IU] via SUBCUTANEOUS
  Administered 2012-07-12: 3 [IU] via SUBCUTANEOUS
  Administered 2012-07-13 (×2): 2 [IU] via SUBCUTANEOUS
  Administered 2012-07-13 – 2012-07-14 (×2): 1 [IU] via SUBCUTANEOUS
  Administered 2012-07-14: 3 [IU] via SUBCUTANEOUS
  Administered 2012-07-15: 1 [IU] via SUBCUTANEOUS
  Administered 2012-07-15: 2 [IU] via SUBCUTANEOUS
  Administered 2012-07-15 (×2): 1 [IU] via SUBCUTANEOUS
  Administered 2012-07-16: 2 [IU] via SUBCUTANEOUS
  Administered 2012-07-16: 1 [IU] via SUBCUTANEOUS
  Administered 2012-07-16: 2 [IU] via SUBCUTANEOUS
  Administered 2012-07-17: 18:00:00 via SUBCUTANEOUS
  Administered 2012-07-17: 2 [IU] via SUBCUTANEOUS
  Administered 2012-07-17: 1 [IU] via SUBCUTANEOUS
  Administered 2012-07-18: 3 [IU] via SUBCUTANEOUS
  Administered 2012-07-19 – 2012-07-20 (×2): 2 [IU] via SUBCUTANEOUS
  Administered 2012-07-21: 1 [IU] via SUBCUTANEOUS
  Administered 2012-07-21 – 2012-07-22 (×4): 2 [IU] via SUBCUTANEOUS

## 2012-07-12 MED ORDER — ONDANSETRON HCL 4 MG/2ML IJ SOLN
4.0000 mg | Freq: Four times a day (QID) | INTRAMUSCULAR | Status: DC | PRN
  Administered 2012-07-15 – 2012-07-21 (×4): 4 mg via INTRAVENOUS
  Filled 2012-07-12 (×4): qty 2

## 2012-07-12 MED ORDER — IPRATROPIUM BROMIDE 0.02 % IN SOLN
0.5000 mg | Freq: Four times a day (QID) | RESPIRATORY_TRACT | Status: DC
  Administered 2012-07-12 – 2012-07-14 (×12): 0.5 mg via RESPIRATORY_TRACT
  Filled 2012-07-12 (×12): qty 2.5

## 2012-07-12 MED ORDER — NYSTATIN 100000 UNIT/ML MT SUSP
5.0000 mL | Freq: Four times a day (QID) | OROMUCOSAL | Status: DC
  Administered 2012-07-12 – 2012-07-22 (×40): 500000 [IU] via OROMUCOSAL
  Filled 2012-07-12 (×45): qty 5

## 2012-07-12 MED ORDER — ACETAMINOPHEN 325 MG PO TABS
650.0000 mg | ORAL_TABLET | Freq: Four times a day (QID) | ORAL | Status: DC | PRN
  Administered 2012-07-12 (×2): 650 mg via ORAL
  Filled 2012-07-12 (×2): qty 2

## 2012-07-12 MED ORDER — ENOXAPARIN SODIUM 40 MG/0.4ML ~~LOC~~ SOLN
40.0000 mg | SUBCUTANEOUS | Status: DC
  Administered 2012-07-12 – 2012-07-22 (×11): 40 mg via SUBCUTANEOUS
  Filled 2012-07-12 (×12): qty 0.4

## 2012-07-12 MED ORDER — ALBUTEROL SULFATE (5 MG/ML) 0.5% IN NEBU
2.5000 mg | INHALATION_SOLUTION | RESPIRATORY_TRACT | Status: DC | PRN
  Administered 2012-07-14: 2.5 mg via RESPIRATORY_TRACT
  Filled 2012-07-12 (×2): qty 0.5

## 2012-07-12 MED ORDER — SIMVASTATIN 20 MG PO TABS
20.0000 mg | ORAL_TABLET | Freq: Every day | ORAL | Status: DC
  Administered 2012-07-12 – 2012-07-21 (×10): 20 mg via ORAL
  Filled 2012-07-12 (×12): qty 1

## 2012-07-12 MED ORDER — LATANOPROST 0.005 % OP SOLN
1.0000 [drp] | Freq: Every day | OPHTHALMIC | Status: DC
  Administered 2012-07-12 – 2012-07-21 (×10): 1 [drp] via OPHTHALMIC
  Filled 2012-07-12 (×2): qty 2.5

## 2012-07-12 MED ORDER — ENSURE COMPLETE PO LIQD
237.0000 mL | Freq: Four times a day (QID) | ORAL | Status: DC
  Administered 2012-07-12 – 2012-07-18 (×17): 237 mL via ORAL

## 2012-07-12 MED ORDER — VANCOMYCIN HCL IN DEXTROSE 1-5 GM/200ML-% IV SOLN
1000.0000 mg | Freq: Two times a day (BID) | INTRAVENOUS | Status: DC
  Administered 2012-07-12 – 2012-07-18 (×12): 1000 mg via INTRAVENOUS
  Filled 2012-07-12 (×12): qty 200

## 2012-07-12 MED ORDER — ONDANSETRON HCL 4 MG PO TABS: 4.0000 mg | ORAL_TABLET | Freq: Four times a day (QID) | ORAL | Status: DC | PRN

## 2012-07-12 MED ORDER — GUAIFENESIN ER 600 MG PO TB12
600.0000 mg | ORAL_TABLET | Freq: Two times a day (BID) | ORAL | Status: DC
  Administered 2012-07-12 – 2012-07-22 (×21): 600 mg via ORAL
  Filled 2012-07-12 (×24): qty 1

## 2012-07-12 MED ORDER — DOCUSATE SODIUM 100 MG PO CAPS
100.0000 mg | ORAL_CAPSULE | Freq: Two times a day (BID) | ORAL | Status: DC
  Administered 2012-07-12 – 2012-07-17 (×8): 100 mg via ORAL
  Filled 2012-07-12 (×12): qty 1

## 2012-07-12 MED ORDER — PROCHLORPERAZINE MALEATE 10 MG PO TABS
10.0000 mg | ORAL_TABLET | Freq: Four times a day (QID) | ORAL | Status: DC | PRN
  Filled 2012-07-12: qty 1

## 2012-07-12 MED ORDER — HYDROCODONE-ACETAMINOPHEN 5-325 MG PO TABS: 1.0000 | ORAL_TABLET | ORAL | Status: DC | PRN

## 2012-07-12 MED ORDER — DEXTROSE 5 % IV SOLN
1.0000 g | Freq: Two times a day (BID) | INTRAVENOUS | Status: DC
  Administered 2012-07-12 – 2012-07-17 (×10): 1 g via INTRAVENOUS
  Administered 2012-07-17: 01:00:00 via INTRAVENOUS
  Administered 2012-07-18: 1 g via INTRAVENOUS
  Filled 2012-07-12 (×12): qty 1

## 2012-07-12 NOTE — Progress Notes (Signed)
PRINCIPAL DIAGNOSIS: Stage IIIB/IV non-small cell lung cancer, squamous cell carcinoma diagnosed in June 2012.   PRIOR THERAPY:  1) Status post a course of concurrent chemoradiation with weekly carboplatin and paclitaxel. Last dose of chemotherapy was given on November 01, 2010.  2) Systemic chemotherapy with carboplatin for an AUC of 5 given on day 1 and gemcitabine 1000 mg per meter square given on days 1 and 8 every 3 weeks status post 1 cycle. However due to thrombocytopenia from cycle 2 forward he will proceed with carboplatin for an AUC of 5 given on day 1 and gemcitabine at 800 mg per meter squared given on days 1 and 8 every 3 weeks. Status post 6 cycles with evidence for disease progression after cycle #6.  3) Palliative radiotherapy to the enlarging left lower lobe lung mass under the care of Dr. Kathrynn Running.   CURRENT THERAPY: Systemic chemotherapy with docetaxel 75 mg/M2 every 3 weeks with Neulasta support, status post 2 cycles, first cycle was started on 06/12/2012.   Subjective: The patient is seen and examined today. His wife was at the bedside. He was admitted yesterday with significant fatigue and weakness as well as lack of appetite and weight loss. He is feeling a little bit better today after he received IV hydration. His appetite is still poor. He denied having any significant nausea or vomiting. He has no chest pain but continues to have shortness breath with exertion.  Objective: Vital signs in last 24 hours: Temp:  [98.2 F (36.8 C)-101.8 F (38.8 C)] 98.2 F (36.8 C) (05/01 1041) Pulse Rate:  [48-118] 106 (05/01 1041) Resp:  [18-27] 20 (05/01 1041) BP: (85-123)/(38-75) 100/38 mmHg (05/01 1041) SpO2:  [91 %-97 %] 94 % (05/01 1041) Weight:  [180 lb 5.4 oz (81.8 kg)] 180 lb 5.4 oz (81.8 kg) (05/01 0335)  Intake/Output from previous day: 04/30 0701 - 05/01 0700 In: 2475 [P.O.:100; I.V.:2375] Out: -  Intake/Output this shift:    General appearance: alert, cooperative and  no distress Resp: clear to auscultation bilaterally Cardio: regular rate and rhythm, S1, S2 normal, no murmur, click, rub or gallop GI: soft, non-tender; bowel sounds normal; no masses,  no organomegaly Extremities: extremities normal, atraumatic, no cyanosis or edema  Lab Results:   Recent Labs  07/11/12 1729 07/12/12 0520  WBC 12.6* 9.5  HGB 9.5* 8.2*  HCT 27.9* 24.8*  PLT 135* 107*   BMET  Recent Labs  07/11/12 1729 07/12/12 0520  NA 132* 135  K 4.3 3.6  CL 95* 102  CO2 23 23  GLUCOSE 222* 137*  BUN 39* 24*  CREATININE 1.36* 1.07  CALCIUM 9.2 7.9*    Studies/Results: Dg Chest Portable 1 View  07/11/2012  *RADIOLOGY REPORT*  Clinical Data: Weakness.  History of lung cancer.  Diabetes.  COPD.  PORTABLE CHEST - 1 VIEW  Comparison: 06/06/2012.  Findings: Left lower lobe collapse / consolidation associated with mass lesion.  Allowing for differences in technique, this appears little changed compared to 06/06/2012.  The right lung appears clear.  The right IJ Port-A-Cath. Monitoring leads are projected over the chest. No pneumothorax.  Bilateral AC joint osteoarthritis.  IMPRESSION:  1.  No definite acute abnormality. 2.  Mass effect and consolidation at the left lung base appears similar to prior CT when allowing for differences in technique.  No definite superimposed acute abnormality.  Postobstructive pneumonia associated with left lower lobe mass merits consideration if the patient has an elevated white blood cell count.   Original  Report Authenticated By: Andreas Newport, M.D.     Medications: I have reviewed the patient's current medications.  Assessment/Plan: This is a very pleasant 77 years old white male with history of stage IIIB/4 non-small cell lung cancer, squamous cell carcinoma status post several regimen of treatment including concurrent chemoradiation as well as chemotherapy with carboplatin and gemcitabine and currently on single agent docetaxel. The patient has  rough time with his current treatment with significant fatigue and weakness as well as dehydration and weight loss. I would consider changing his docetaxel from every 3 weeks as scheduled to reduced weekly doses. Continue IV hydration and the current care. Thank you so much for taking good care of [name], I will continue to follow up with you and assist in his management on as-needed basis.   LOS: 1 day    Rachid Parham K. 07/12/2012

## 2012-07-12 NOTE — Progress Notes (Signed)
ANTIBIOTIC CONSULT NOTE - INITIAL  Pharmacy Consult for Vancomycin, Cefepime Indication: suspected pneumonia  Allergies  Allergen Reactions  . Codeine Other (See Comments)    Made pt unrepsonsive  . Penicillins     Patient Measurements: Height: 5\' 10"  (177.8 cm) Weight: 180 lb 5.4 oz (81.8 kg) IBW/kg (Calculated) : 73  Vital Signs: Temp: 98.2 F (36.8 C) (05/01 1041) Temp src: Oral (05/01 1041) BP: 100/38 mmHg (05/01 1041) Pulse Rate: 106 (05/01 1041) Intake/Output from previous day: 04/30 0701 - 05/01 0700 In: 2475 [P.O.:100; I.V.:2375] Out: -  Intake/Output from this shift:    Labs:  Recent Labs  07/10/12 0834 07/11/12 1729 07/12/12 0520  WBC 8.2 12.6* 9.5  HGB 10.6* 9.5* 8.2*  PLT 152 135* 107*  CREATININE 1.6* 1.36* 1.07   Estimated Creatinine Clearance: 61.6 ml/min (by C-G formula based on Cr of 1.07). No results found for this basename: VANCOTROUGH, VANCOPEAK, VANCORANDOM, GENTTROUGH, GENTPEAK, GENTRANDOM, TOBRATROUGH, TOBRAPEAK, TOBRARND, AMIKACINPEAK, AMIKACINTROU, AMIKACIN,  in the last 72 hours    Medical History: Past Medical History  Diagnosis Date  . Lung cancer 2012    recurrence 12/2011  . Diabetes mellitus   . HTN (hypertension)   . High cholesterol   . Hx of radiation therapy 09/22/10 - 11/05/10    LLL lung  . History of chemotherapy   . History of radiation therapy 04/24/12-05/15/12    lllung 35Gy/14dx    Assessment: 73 yom with h/o NSCLC currently receiving chemotherapy with Taxotere q21d (C2 completed 4/22 with Neulasta on 4/23) presented 4/30 with c/o weakness.  CXR 4/30 reads postobstructive pneumonia associated with left lower lobe mass merits consideration if the patient has an elevated white blood cell count.  Today, 5/1, patient is admitted, note Tmax of 101.8 and MD would like to start Vancomycin and Cefepime for suspected PNA.  Tmax 101.8, WBC wnl, Scr 1.07 for CG Crcl 62, normalized CrCl 61 ml/min. 5/1 blood culture pending.    Goal of Therapy:  Vancomycin trough level 15-20 mcg/ml Appropriate renal dosing of Cefepime Eradication of infection  Plan:   Vancomycin 1gm IV q12h  Cefepime 1gm IV q12h  Pharmacy will f/u  Geoffry Paradise, PharmD, BCPS Pager: [phone] 12:56 PM Pharmacy #: [id number]

## 2012-07-12 NOTE — Evaluation (Signed)
Physical Therapy Evaluation Patient Details Name: [name] MRN: [id number] DOB: 1935-10-15 Today's Date: 07/12/2012 Time: 4098-1191 PT Time Calculation (min): 19 min  PT Assessment / Plan / Recommendation Clinical Impression  Pt is a 77 year old male admitted with hypotension and PMHx including chemotherapy for Stage IIIB/IV non-small cell lung cancer.  Pt with low BP today however denies dizziness with sitting and standing so ambulated short distance only for safety.  Pt would benefit from acute PT services in order to improve independence with transfers, ambulation and stairs to prepare for d/c to next venue.  Pt will need at least min assist upon d/c for ambulation for safety and if this is not available then recommend ST-SNF for improving generalized weakness, balance, and mobility.    PT Assessment  Patient needs continued PT services    Follow Up Recommendations  Home health PT;Supervision/Assistance - 24 hour    Does the patient have the potential to tolerate intense rehabilitation      Barriers to Discharge        Equipment Recommendations  None recommended by PT    Recommendations for Other Services     Frequency Min 3X/week    Precautions / Restrictions Precautions Precautions: Fall Precaution Comments: monitor BP Restrictions Weight Bearing Restrictions: No   Pertinent Vitals/Pain BP: supine 98/41, sitting 96/41, after ambulation 101/41 HR 85 SaO2 at rest on 2L 93%      Mobility  Bed Mobility Bed Mobility: Supine to Sit Supine to Sit: 4: Min assist Details for Bed Mobility Assistance: assist for trunk upright Transfers Transfers: Sit to Stand;Stand to Sit Sit to Stand: 4: Min assist;With upper extremity assist;From bed Stand to Sit: 4: Min guard;With upper extremity assist;To chair/3-in-1 Details for Transfer Assistance: verbal cues for safe technique Ambulation/Gait Ambulation/Gait Assistance: 4: Min assist Ambulation Distance (Feet): 24  Feet Assistive device: Rolling walker Ambulation/Gait Assistance Details: pt ambulated out into hallway then back into room, kept short distance due to BP, daughter assisted with IV pole and O2 tank, pt demonstrates unsteadiness esp with turning requiring assist Gait Pattern: Step-through pattern Gait velocity: decreased    Exercises     PT Diagnosis: Difficulty walking;Generalized weakness  PT Problem List: Decreased strength;Decreased activity tolerance;Decreased balance;Decreased mobility;Decreased knowledge of use of DME;Cardiopulmonary status limiting activity PT Treatment Interventions: Gait training;DME instruction;Therapeutic activities;Functional mobility training;Patient/family education;Therapeutic exercise;Balance training   PT Goals Acute Rehab PT Goals PT Goal Formulation: With patient Time For Goal Achievement: 07/26/12 Potential to Achieve Goals: Good Pt will go Supine/Side to Sit: with supervision PT Goal: Supine/Side to Sit - Progress: Goal set today Pt will go Sit to Stand: with supervision PT Goal: Sit to Stand - Progress: Goal set today Pt will go Stand to Sit: with supervision PT Goal: Stand to Sit - Progress: Goal set today Pt will Ambulate: >150 feet;with supervision;with least restrictive assistive device PT Goal: Ambulate - Progress: Goal set today Pt will Go Up / Down Stairs: 6-9 stairs;with supervision;with rail(s);with least restrictive assistive device PT Goal: Up/Down Stairs - Progress: Goal set today  Visit Information  Last PT Received On: 07/12/12 Assistance Needed: +2 (safety, equip)    Subjective Data  Subjective: I guess. Patient Stated Goal: agreeable to therapy   Prior Functioning  Home Living Lives With: Spouse Available Help at Discharge: Family Type of Home: House Home Layout: Multi-level Alternate Level Stairs-Number of Steps: 5-6 throughout house all with one rail Alternate Level Stairs-Rails: Right Home Adaptive Equipment:  Walker - rolling;Straight cane  Prior Function Level of Independence: Independent Comments: usually independent but has been using RW due to weakness for past week Communication Communication: No difficulties    Cognition  Cognition Arousal/Alertness: Awake/alert Behavior During Therapy: WFL for tasks assessed/performed Overall Cognitive Status: Within Functional Limits for tasks assessed    Extremity/Trunk Assessment Right Lower Extremity Assessment RLE ROM/Strength/Tone: [facility] for tasks assessed Left Lower Extremity Assessment LLE ROM/Strength/Tone: [facility] for tasks assessed   Balance    End of Session PT - End of Session Equipment Utilized During Treatment: Gait belt;Oxygen Activity Tolerance: Patient tolerated treatment well Patient left: in chair;with call bell/phone within reach;with family/visitor present Nurse Communication:  (RN aware pt up in recliner, BP)  GP     Ruthel Martine,KATHrine E 07/12/2012, 12:17 PM Zenovia Jarred, PT, DPT 07/12/2012 Pager: [phone]

## 2012-07-12 NOTE — Progress Notes (Addendum)
[facility] PROGRESS NOTE  [name] EXB:[id number] DOB: 1935/08/26 DOA: 07/11/2012 PCP: [name], MD  I have seen and examined pt admitted this a.m. per Dr. Adela Glimpse with a past medical history of Lung cancer (2012); Diabetes mellitus; HTN (hypertension); High cholesterol; radiation therapy (09/22/10 - 11/05/10); History of chemotherapy; and History of radiation therapy (04/24/12-05/15/12). He currently undergoing chemotherapy for Stage IIIB/IV non-small cell lung cancer presented with increasing nausea or vomiting and worsening generalized weakness. This noted that patient was hypotensive in the ED but this responded to IV fluids. Urinalysis in the ED was negative for infection, and chest x-ray with mass effect in consultation at the left lung base similar to prior x-rays but possible post obstructive pneumonia associated with left lower lobe mass not ruled out. His blood pressure stable this a.m. but patient had a fever to 101.8, and given the above x-ray findings we'll empirically start on antibiotics for possible postobstructive PNA versus HCAP, and followup on blood cultures obtained this a.m. he states nausea vomiting better this a.m. but still decreased appetite. We'll otherwise continue current management plan as per Dr. Adela Glimpse this a.m. and follow. Dr. Arbutus Ped to follow for further recommendations.   Kela Millin  [facility] Pager [phone]. If 7PM-7AM, please contact night-coverage at [email or web address], password [facility] 07/12/2012, 12:42 PM  LOS: 1 day

## 2012-07-12 NOTE — ED Notes (Signed)
Floor Unit RN unavailable to take report on pt at this time. 

## 2012-07-12 NOTE — Care Management Note (Addendum)
    Page 1 of 2   07/19/2012     5:46:03 PM   CARE MANAGEMENT NOTE 07/19/2012  Patient:  [name], [name]   Account Number:  [id number]  Date Initiated:  07/12/2012  Documentation initiated by:  Lanier Clam  Subjective/Objective Assessment:   ADMITTED W/WEAKNESS,HYPOTENSION,DEHYDRATION,POOR PO INTAKE.LUNG CA.     Action/Plan:   FROM HOME W/SPOUSE.HAS PCP,PHARMACY,& RW(NOT HIS),SHOWER CHAIR.   Anticipated DC Date:  07/20/2012   Anticipated DC Plan:  HOME W HOME HEALTH SERVICES      DC Planning Services  CM consult      Choice offered to / List presented to:  C-3 Spouse        HH arranged  HH-2 PT  HH-3 OT      [facility] agency  [facility]   Status of service:  In process, will continue to follow Medicare Important Message given?   (If response is "NO", the following Medicare IM given date fields will be blank) Date Medicare IM given:   Date Additional Medicare IM given:    Discharge Disposition:    Per UR Regulation:  Reviewed for med. necessity/level of care/duration of stay  If discussed at Long Length of Stay Meetings, dates discussed:   07/17/2012  07/19/2012    Comments:  07/19/12 Tykira Wachs RN,BSN NCM [phone] SOB,CHECKING SATS.DIURESING.D/C PLAN HOME W/HH.  07/18/12 Shaquaya Wuellner RN,BSN NCM [phone] PT/OT-HH.[facility] CHOSEN.PATIENT HE DOES NOT HAVE HIS OWN RW,& IF QUALIFIES WOULD LIKE ONE.NOTED LOW 02 SATS.IF QUALIFIES FOR HOME 02 CAN ARRANGE.WILL NEED 02 SATS-REST ON RA,AMBULATING W/O 02,RECOVERY W/02 DOCUMENTED.  07/16/12 Amiri Riechers RN,BSN NCM [phone] IV ABX.D/C PLAN [facility] HHPT.  07/12/12 Chiquita Heckert RN,BSN NCM [phone] ONC CONS.NUTRITION FOLLOWING.PT-HH.[facility] CHOSEN BY SPOUSE FOR HHPT.[facility] KRISTEN(REP) CONTACTED,& AWARE OF REFERRAL.

## 2012-07-12 NOTE — Progress Notes (Signed)
INITIAL NUTRITION ASSESSMENT  DOCUMENTATION CODES Per approved criteria  -Not Applicable   INTERVENTION: Provide Ensure Complete po QID, each supplement provides 350 kcal and 13 grams of protein. Provide Magic Cup once daily Encourage PO intake   NUTRITION DIAGNOSIS: Inadequate oral intake related to nausea/vomiting with chemotherapy as evidenced by pt's report of poor po intake 9% wt loss in the past month.   Goal: Pt to meet >/= 90% of their estimated nutrition needs  Monitor:  PO intake Wt N/V/D Labs  Reason for Assessment: Consult  77 y.o. male  Admitting Dx: Hypotension  ASSESSMENT: 77 y.o. male with a past medical history of Lung cancer (2012); Diabetes mellitus, HTN, High cholesterol, radiation therapy (09/22/10 - 11/05/10); History of chemotherapy; and History of radiation therapy (04/24/12-05/15/12).  He is currently undergoing chemotherapy for Stage IIIB/IV non-small cell lung cancer, squamous cell carcinoma diagnosed in June 2012. He has had some fatigue and weakness and has hard time ambulating due to this for some time now. He has had some nausea and vomiting as well as diarrhea associated with taking his chemo. Wife states patient felt so weak he almost fainted but no LOC. He has had severe nausea and vomiting unable to tolerate much of any PO.  Pt reports that he usually weighs 210 lbs but, has lost weight with treatment. Pt states that he has had very little solid food since Wednesday 4/23 due to nausea and vomiting but, he has been drinking 3-5 Boost Plus supplements daily. Before 4/23 pt was eating fairly well. Encouraged pt to order foods and at least take a few bites as tolerated as well as continuation of Ensure/Boost intake.  Height: Ht Readings from Last 1 Encounters:  07/12/12 5\' 10"  (1.778 m)    Weight: Wt Readings from Last 1 Encounters:  07/12/12 180 lb 5.4 oz (81.8 kg)    Ideal Body Weight: 166 lbs   % Ideal Body Weight: 108%  Wt Readings from  Last 10 Encounters:  07/12/12 180 lb 5.4 oz (81.8 kg)  07/03/12 182 lb 4.8 oz (82.691 kg)  06/14/12 199 lb 4.8 oz (90.402 kg)  06/07/12 193 lb 8 oz (87.771 kg)  05/09/12 197 lb 14.4 oz (89.767 kg)  05/09/12 197 lb 4.8 oz (89.495 kg)  05/04/12 199 lb 4.8 oz (90.402 kg)  05/02/12 199 lb 3.2 oz (90.357 kg)  04/04/12 204 lb (92.534 kg)  04/04/12 206 lb (93.441 kg)    Usual Body Weight: 210 lbs  % Usual Body Weight: 86%  BMI:  Body mass index is 25.88 kg/(m^2).  Estimated Nutritional Needs: Kcal: 2190-2500 Protein: 98-123 grams Fluid: 2.4 L  Skin: intact  Diet Order: Carb Control  EDUCATION NEEDS: -No education needs identified at this time   Intake/Output Summary (Last 24 hours) at 07/12/12 1008 Last data filed at 07/12/12 0630  Gross per 24 hour  Intake   2475 ml  Output      0 ml  Net   2475 ml    Last BM: 4/30  Labs:   Recent Labs Lab 07/10/12 0834 07/11/12 1729 07/12/12 0520  NA 133* 132* 135  K 4.8 4.3 3.6  CL 99 95* 102  CO2 21* 23 23  BUN 39.6* 39* 24*  CREATININE 1.6* 1.36* 1.07  CALCIUM 9.2 9.2 7.9*  MG  --   --  1.7  PHOS  --   --  2.1*  GLUCOSE 330* 222* 137*    CBG (last 3)   Recent Labs  07/11/12  1821 07/12/12 0455 07/12/12 0746  GLUCAP 213* 130* 112*    Scheduled Meds: . docusate sodium  100 mg Oral BID  . enoxaparin (LOVENOX) injection  40 mg Subcutaneous Q24H  . guaiFENesin  600 mg Oral BID  . insulin aspart  0-9 Units Subcutaneous Q4H  . ipratropium  0.5 mg Nebulization Q6H  . latanoprost  1 drop Both Eyes QHS  . nystatin  5 mL Mouth/Throat QID  . simvastatin  20 mg Oral q1800  . sodium chloride  3 mL Intravenous Q12H    Continuous Infusions: . sodium chloride 1,000 mL (07/12/12 1610)    Past Medical History  Diagnosis Date  . Lung cancer 2012    recurrence 12/2011  . Diabetes mellitus   . HTN (hypertension)   . High cholesterol   . Hx of radiation therapy 09/22/10 - 11/05/10    LLL lung  . History of  chemotherapy   . History of radiation therapy 04/24/12-05/15/12    lllung 35Gy/14dx    Past Surgical History  Procedure Laterality Date  . Tonsillectomy    . Gastrostomy tube placement  2011    has been removed  . Portacath placement  08/2010    Ian Malkin RD, LDN Inpatient Clinical Dietitian Pager: [phone] After Hours Pager: [phone]

## 2012-07-12 NOTE — Progress Notes (Signed)
Echocardiogram 2D Echocardiogram has been performed.  [name] 07/12/2012, 1:11 PM

## 2012-07-12 NOTE — H&P (Signed)
PCP:  [name], MD  [facility] Write  Chief Complaint:   Weak all over  HPI: [name] is a 77 y.o. male   has a past medical history of Lung cancer (2012); Diabetes mellitus; HTN (hypertension); High cholesterol; radiation therapy (09/22/10 - 11/05/10); History of chemotherapy; and History of radiation therapy (04/24/12-05/15/12).   Presented with  He is currently undergoing chemotherapy for Stage IIIB/IV non-small cell lung cancer, squamous cell carcinoma diagnosed in June 2012. He is now on docetaxel  every 3 weeks with Neulasta first cycle was given on 06/12/2012.   He have had some fatigue and weakness and has hard time ambulating due to this for some time now.   He has had some nausea and vomiting as well as diarrhea associated with taking his chemo. Patient have required IV rehydration in the past. Wife states patient felt so weak he almost fainted but no LOC. HE have had severe nausea and vomiting unable to tolerate much of any PO. ON presentation to ED he was found to be hypotensive which has improved with IVF.  Denies any fever or chills but states he stays cold all the time. Patient have hx of thrush and is currently on medication for this. Denies any chest pain but have had some shortness of breath on exertion. ECG was worrisome for lateral ST segment depressions that are new. Troponin was WNL.  Hospitalist was called for an admssion  Review of Systems:    Pertinent positives include: fatigue, nausea, vomiting, diarrhea, productive cough,  shortness of breath at rest.   dyspnea on exertion  Constitutional:  No weight loss, night sweats, Fevers, chills,  weight loss  HEENT:  No headaches, Difficulty swallowing,Tooth/dental problems,Sore throat,  No sneezing, itching, ear ache, nasal congestion, post nasal drip,  Cardio-vascular:  No chest pain, Orthopnea, PND, anasarca, dizziness, palpitations.no Bilateral lower extremity swelling  GI:  No  heartburn, indigestion, abdominal pain, change in bowel habits, loss of appetite, melena, blood in stool, hematemesis Resp:  no, No excess mucus, no  No non-productive cough, No coughing up of blood.No change in color of mucus.No wheezing. Skin:  no rash or lesions. No jaundice GU:  no dysuria, change in color of urine, no urgency or frequency. No straining to urinate.  No flank pain.  Musculoskeletal:  No joint pain or no joint swelling. No decreased range of motion. No back pain.  Psych:  No change in mood or affect. No depression or anxiety. No memory loss.  Neuro: no localizing neurological complaints, no tingling, no weakness, no double vision, no gait abnormality, no slurred speech, no confusion  Otherwise ROS are negative except for above, 10 systems were reviewed  Past Medical History: Past Medical History  Diagnosis Date  . Lung cancer 2012    recurrence 12/2011  . Diabetes mellitus   . HTN (hypertension)   . High cholesterol   . Hx of radiation therapy 09/22/10 - 11/05/10    LLL lung  . History of chemotherapy   . History of radiation therapy 04/24/12-05/15/12    lllung 35Gy/14dx   Past Surgical History  Procedure Laterality Date  . Tonsillectomy    . Gastrostomy tube placement  2011    has been removed  . Portacath placement  08/2010     Medications: Prior to Admission medications   Medication Sig Start Date End Date Taking? Authorizing Provider  dexamethasone (DECADRON) 4 MG tablet Take 1 tablet (4 mg total) by mouth as directed. 2 tablets  BID day before, day of, day after chemotherapy. 06/08/12  Yes Si Gaul, MD  enalapril (VASOTEC) 5 MG tablet Take 5 mg by mouth daily.   Yes Historical Provider, MD  glimepiride (AMARYL) 4 MG tablet 2 tabs daily with breakfast   Yes Historical Provider, MD  ibuprofen (ADVIL,MOTRIN) 400 MG tablet Take 400 mg by mouth every 8 (eight) hours as needed for pain.   Yes Historical Provider, MD  latanoprost (XALATAN) 0.005 %  ophthalmic solution Place 1 drop into both eyes at bedtime.    Yes Historical Provider, MD  loratadine (CLARITIN) 10 MG tablet Take 10 mg by mouth as needed.    Yes Historical Provider, MD  lovastatin (MEVACOR) 20 MG tablet Take 20 mg by mouth at bedtime.   Yes Historical Provider, MD  NON FORMULARY Pt is currently receiving CHemotherapy   Yes Historical Provider, MD  ondansetron (ZOFRAN) 8 MG tablet Take 1 tablet (8 mg total) by mouth every 8 (eight) hours as needed (as needed for nausea and vomiting). 06/18/12  Yes Si Gaul, MD  pioglitazone (ACTOS) 30 MG tablet Take 30 mg by mouth daily.  12/31/11  Yes Historical Provider, MD  prochlorperazine (COMPAZINE) 10 MG tablet Take 1 tablet (10 mg total) by mouth every 6 (six) hours as needed. 04/04/12  Yes Si Gaul, MD  sitaGLIPtin (JANUVIA) 100 MG tablet Take 100 mg by mouth daily.   Yes Historical Provider, MD  tiotropium (SPIRIVA HANDIHALER) 18 MCG inhalation capsule Place 1 capsule (18 mcg total) into inhaler and inhale daily. 02/21/12  Yes Storm Frisk, MD    Allergies:   Allergies  Allergen Reactions  . Codeine Other (See Comments)    Made pt unrepsonsive  . Penicillins     Social History:  Ambulatory  Walker  Lives at  Home with family   reports that he quit smoking about 25 years ago. His smoking use included Cigarettes. He has a 136 pack-year smoking history. He does not have any smokeless tobacco history on file. He reports that he does not drink alcohol or use illicit drugs.   Family History: family history includes Breast cancer in his paternal aunt and Heart disease in his father and mother.    Physical Exam: Patient Vitals for the past 24 hrs:  BP Temp Temp src Pulse Resp SpO2  07/11/12 2308 104/54 mmHg - - 105 27 92 %  07/11/12 2058 85/42 mmHg - - 118 - -  07/11/12 2055 89/44 mmHg - - 111 - -  07/11/12 2054 104/49 mmHg 99.3 F (37.4 C) Oral 103 25 91 %  07/11/12 1759 123/66 mmHg 98.5 F (36.9 C) Oral  100 18 94 %  07/11/12 1722 87/61 mmHg 98.9 F (37.2 C) Oral 106 26 93 %    1. General:  in No Acute distress 2. Psychological: Alert and  Oriented 3. Head/ENT:     Dry Mucous Membranes                          Head Non traumatic, neck supple                          Normal  Dentition 4. SKIN:   decreased Skin turgor,  Skin clean Dry and intact no rash 5. Heart: rapid but Regular rate and rhythm no Murmur, Rub or gallop 6. Lungs: occasional wheezes but no crackles   7. Abdomen: Soft, non-tender, Non distended 8.  Lower extremities: no clubbing, cyanosis, or edema 9. Neurologically Grossly intact, moving all 4 extremities equally 10. MSK: Normal range of motion  body mass index is unknown because there is no weight on file.   Labs on Admission:   Recent Labs  07/10/12 0834 07/11/12 1729  NA 133* 132*  K 4.8 4.3  CL 99 95*  CO2 21* 23  GLUCOSE 330* 222*  BUN 39.6* 39*  CREATININE 1.6* 1.36*  CALCIUM 9.2 9.2    Recent Labs  07/10/12 0834 07/11/12 1729  AST 11 24  ALT 17 18  ALKPHOS 81 136*  BILITOT 1.19 0.7  PROT 6.6 6.6  ALBUMIN 2.7* 2.7*   No results found for this basename: LIPASE, AMYLASE,  in the last 72 hours  Recent Labs  07/10/12 0834 07/11/12 1729  WBC 8.2 12.6*  NEUTROABS 7.3* 11.5*  HGB 10.6* 9.5*  HCT 31.5* 27.9*  MCV 85.4 85.6  PLT 152 135*    Recent Labs  07/11/12 1815  CKTOTAL 21  TROPONINI <0.30   No results found for this basename: TSH, T4TOTAL, FREET3, T3FREE, THYROIDAB,  in the last 72 hours No results found for this basename: VITAMINB12, FOLATE, FERRITIN, TIBC, IRON, RETICCTPCT,  in the last 72 hours Lab Results  Component Value Date   HGBA1C 6.2* 11/30/2010    The CrCl is unknown because both a height and weight (above a minimum accepted value) are required for this calculation. ABG    Component Value Date/Time   TCO2 23 11/13/2010 1344     No results found for this basename: DDIMER     Other results:  I have  pearsonaly reviewed this: ECG REPORT  Rate: 101  Rhythm: sinus tachycardia ST&T Change: new ST depression in leads v3-v6  UA no evidence of UTI   Cultures:    Component Value Date/Time   SDES BRONCHIAL WASHINGS 09/02/2010 1218   SDES BRONCHIAL WASHINGS 09/02/2010 1218   SDES BRONCHIAL WASHINGS 09/02/2010 1218   SPECREQUEST NONE 09/02/2010 1218   SPECREQUEST NONE 09/02/2010 1218   SPECREQUEST NONE 09/02/2010 1218   CULT Non-Pathogenic Oropharyngeal-type Flora Isolated. 09/02/2010 1218   CULT PENICILLIUM SPECIES 09/02/2010 1218   CULT NO ACID FAST BACILLI ISOLATED IN 6 WEEKS 09/02/2010 1218   REPTSTATUS 09/04/2010 FINAL 09/02/2010 1218   REPTSTATUS 10/01/2010 FINAL 09/02/2010 1218   REPTSTATUS 10/15/2010 FINAL 09/02/2010 1218       Radiological Exams on Admission: Dg Chest Portable 1 View  07/11/2012  *RADIOLOGY REPORT*  Clinical Data: Weakness.  History of lung cancer.  Diabetes.  COPD.  PORTABLE CHEST - 1 VIEW  Comparison: 06/06/2012.  Findings: Left lower lobe collapse / consolidation associated with mass lesion.  Allowing for differences in technique, this appears little changed compared to 06/06/2012.  The right lung appears clear.  The right IJ Port-A-Cath. Monitoring leads are projected over the chest. No pneumothorax.  Bilateral AC joint osteoarthritis.  IMPRESSION:  1.  No definite acute abnormality. 2.  Mass effect and consolidation at the left lung base appears similar to prior CT when allowing for differences in technique.  No definite superimposed acute abnormality.  Postobstructive pneumonia associated with left lower lobe mass merits consideration if the patient has an elevated white blood cell count.   Original Report Authenticated By: Andreas Newport, M.D.     Chart has been reviewed  Assessment/Plan  This is a 77 year old gentleman with history of non-small cell lung cancer currently on chemotherapy presenting with dehydration and failure to thrive.  Present  on Admission:   . Hypotension - most likely secondary to dehydration seems to improve with IV fluids. Sepsis less likely. Patient does have elevated WBCs but he had received Neulasta and Decadron. Chest x-ray and UA is unremarkable for any evidence of infection. . Lung cancer - as per Dr. Sofie Hartigan  . 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  . Abnormal ECG - denies any chest pain but will cycle cardiac markers, so far troponins are negative, obtained serial EKG monitor on telemetry. Obtain echogram in a.m. to evaluate for any full motion a mildly. Most likely EKG changes are due to demand ischemia due to hypertension  . Dehydration - IV fluids  . Failure to thrive - patient had head repeated episodes of nausea vomiting poor by mouth intake. We'll check albumin order nutritional consult  . Diabetes - since the sliding scale hold his by mouth medications as he have had not had there for past one week and have had very poor by mouth intake  . Debility - will have physical therapy and outpatient therapy evaluate the patient Ginette Pitman - seems improving will write for nystatin   Prophylaxis: Lovenox, Protonix  CODE STATUS: FULL CODE  Other plan as per orders.  I have spent a total of 55 min on this admission  [name] 07/12/2012, 12:25 AM

## 2012-07-13 DIAGNOSIS — J189 Pneumonia, unspecified organism: Secondary | ICD-10-CM

## 2012-07-13 DIAGNOSIS — E119 Type 2 diabetes mellitus without complications: Secondary | ICD-10-CM

## 2012-07-13 LAB — BASIC METABOLIC PANEL
BUN: 16 mg/dL (ref 6–23)
Calcium: 7.5 mg/dL — ABNORMAL LOW (ref 8.4–10.5)
Creatinine, Ser: 0.94 mg/dL (ref 0.50–1.35)
GFR calc Af Amer: >90 mL/min (ref >90)
GFR calc non Af Amer: 80 mL/min — ABNORMAL LOW (ref >90)

## 2012-07-13 LAB — GLUCOSE, CAPILLARY: Glucose-Capillary: 156 mg/dL — ABNORMAL HIGH (ref 70–99)

## 2012-07-13 MED ORDER — SODIUM CHLORIDE 0.9 % IJ SOLN
10.0000 mL | INTRAMUSCULAR | Status: DC | PRN
  Administered 2012-07-17 – 2012-07-22 (×2): 10 mL

## 2012-07-13 NOTE — Progress Notes (Signed)
[facility] PROGRESS NOTE  [name]:[id number] DOB: Aug 30, 1935 DOA: 07/11/2012 PCP: [name], MD Assessment/Plan: Present on Admission:  . Probable postobstructive PNA versus HCAP -continue empiric abx with with vanc and cefepime -pt still ferile, follow cultures .Recent Thrush - on nystatin, add diflucan  .Fevers -persisting even on abx as above -follow on added diflucan . Hypotension - on admission thought to be 2/2 hypovolemia, improved with IVF. BP now stable but still soft and infection may be factor here as well given fevers . Lung cancer - as per Dr. Arbutus Ped  . Hyponatremia - this is chronic likely combination of SIADH and dehydration with follow on IV fluids.  . Abnormal ECG  -troponins are negative, follow up EKG  On 5/1 still with ST depression -continue monitoring on telemetry. Most likely EKG changes are due to demand ischemia due to hypotension on admission -echo with no wall motion abnormalities, EF 50-55% . Dehydration - IV fluids  . Failure to thrive/Protein Calorie malnutrition - likely 2/2 above, treat as above -appreciate nutrition recommendations, ensure complete supplements . Diabetes - continue covering with sliding scale insuline,  holding his po medications pending improvement of po intake . Debility - PT/OT recommending HH services    Code Status: full Family Communication: wife and daughter at bedside Disposition Plan: pending clinical courese   Consultants:  onc  Procedures:  Echo  Study Conclusions  - Left ventricle: Systolic function was normal. The estimated ejection fraction was in the range of 50% to 55%. Wall motion was normal; there were no regional wall motion abnormalities. Doppler parameters are consistent with abnormal left ventricular relaxation (grade 1 diastolic dysfunction). Doppler parameters are consistent with high ventricular filling pressure. - Atrial septum: No defect or patent foramen ovale  was identified.   Antibiotics:  Vanc and cefepime - started on 5/1  HPI/Subjective: Pt sitting up in chair, daughter feeding him. Still poor po intake  Objective: Filed Vitals:   07/13/12 1107 07/13/12 1130 07/13/12 1341 07/13/12 1449  BP:    103/49  Pulse: 116 123  98  Temp: 102.4 F (39.1 C) 100.4 F (38 C)  99.8 F (37.7 C)  TempSrc:    Oral  Resp:    18  Height:      Weight:      SpO2:   89% 95%    Intake/Output Summary (Last 24 hours) at 07/13/12 1907 Last data filed at 07/13/12 1830  Gross per 24 hour  Intake 3757.5 ml  Output      0 ml  Net 3757.5 ml   Filed Weights   07/12/12 0335  Weight: 81.8 kg (180 lb 5.4 oz)    Exam:   General:  Frail elderly male in NAD, alert and appropriate  Cardiovascular: RRR  Respiratory: few rhonchi, no wheezes  Abdomen: soft, +BS NT/ND  Extremities: no cyanosis and no edema   Data Reviewed: Basic Metabolic Panel:  Recent Labs Lab 07/10/12 0834 07/11/12 1729 07/12/12 0520 07/13/12 0325  NA 133* 132* 135 132*  K 4.8 4.3 3.6 3.2*  CL 99 95* 102 100  CO2 21* 23 23 24   GLUCOSE 330* 222* 137* 178*  BUN 39.6* 39* 24* 16  CREATININE 1.6* 1.36* 1.07 0.94  CALCIUM 9.2 9.2 7.9* 7.5*  MG  --   --  1.7  --   PHOS  --   --  2.1*  --    Liver Function Tests:  Recent Labs Lab 07/10/12 0834 07/11/12 1729 07/12/12 0520  AST 11 24 12   ALT 17 18 13   ALKPHOS 81 136* 77  BILITOT 1.19 0.7 0.6  PROT 6.6 6.6 5.4*  ALBUMIN 2.7* 2.7* 2.2*   No results found for this basename: LIPASE, AMYLASE,  in the last 168 hours No results found for this basename: AMMONIA,  in the last 168 hours CBC:  Recent Labs Lab 07/10/12 0834 07/11/12 1729 07/12/12 0520  WBC 8.2 12.6* 9.5  NEUTROABS 7.3* 11.5*  --   HGB 10.6* 9.5* 8.2*  HCT 31.5* 27.9* 24.8*  MCV 85.4 85.6 87.0  PLT 152 135* 107*   Cardiac Enzymes:  Recent Labs Lab 07/11/12 1815 07/11/12 2337 07/12/12 0520 07/12/12 1046 07/12/12 1725  CKTOTAL 21  --   --    --   --   TROPONINI <0.30 <0.30 <0.30 <0.30 <0.30   BNP (last 3 results) No results found for this basename: PROBNP,  in the last 8760 hours CBG:  Recent Labs Lab 07/12/12 1648 07/12/12 2126 07/13/12 0741 07/13/12 1147 07/13/12 1705  GLUCAP 201* 124* 137* 174* 156*    Recent Results (from the past 240 hour(s))  CULTURE, BLOOD (ROUTINE X 2)     Status: None   Collection Time    07/12/12  5:10 AM      Result Value Range Status   Specimen Description BLOOD LEFT HAND   Final   Special Requests BOTTLES DRAWN AEROBIC AND ANAEROBIC 5CC   Final   Culture  Setup Time 07/12/2012 10:05   Final   Culture     Final   Value:        BLOOD CULTURE RECEIVED NO GROWTH TO DATE CULTURE WILL BE HELD FOR 5 DAYS BEFORE ISSUING A FINAL NEGATIVE REPORT   Report Status PENDING   Incomplete  CULTURE, BLOOD (ROUTINE X 2)     Status: None   Collection Time    07/12/12  5:15 AM      Result Value Range Status   Specimen Description BLOOD RIGHT ANTECUBITAL   Final   Special Requests BOTTLES DRAWN AEROBIC ONLY 2CC   Final   Culture  Setup Time 07/12/2012 10:07   Final   Culture     Final   Value:        BLOOD CULTURE RECEIVED NO GROWTH TO DATE CULTURE WILL BE HELD FOR 5 DAYS BEFORE ISSUING A FINAL NEGATIVE REPORT   Report Status PENDING   Incomplete     Studies: No results found.  Scheduled Meds: . ceFEPime (MAXIPIME) IV  1 g Intravenous Q12H  . docusate sodium  100 mg Oral BID  . enoxaparin (LOVENOX) injection  40 mg Subcutaneous Q24H  . feeding supplement  237 mL Oral QID  . guaiFENesin  600 mg Oral BID  . insulin aspart  0-9 Units Subcutaneous TID AC & HS  . ipratropium  0.5 mg Nebulization Q6H  . latanoprost  1 drop Both Eyes QHS  . nystatin  5 mL Mouth/Throat QID  . simvastatin  20 mg Oral q1800  . sodium chloride  3 mL Intravenous Q12H  . vancomycin  1,000 mg Intravenous Q12H   Continuous Infusions: . sodium chloride 1,000 mL (07/13/12 1019)    Active Problems:   Lung cancer    Hypotension   Hyponatremia   Abnormal ECG   Dehydration   Failure to thrive   Diabetes   Debility    Time spent:      [facility]  [facility] Pager [phone]. If 7PM-7AM, please  contact night-coverage at [email or web address], password [facility] 07/13/2012, 10:56 AM  LOS: 2 days

## 2012-07-13 NOTE — Progress Notes (Signed)
Physical Therapy Treatment Patient Details Name: [name] MRN: [id number] DOB: [birth year] Today's Date: 07/13/2012 Time: 4098-1191 PT Time Calculation (min): 33 min  PT Assessment / Plan / Recommendation Comments on Treatment Session  Family in room concerned that pt has an increased cough and generally not feeling well.  Pt did feel worm so took his temp 102.4.  Pt on 3 lts O2 at 96% and rersting HR 115.  Assisted pt OOB to mobilize with + 2 assist.  Pt only able to tolerate amb 14' vs 40 feet the last session.  Instructed pt on purse lip breathing and amb with 3lts O2 avg sats with excesion was low 80's.  Temp taken again after activity it decreased to 100.4.  Reported to RN.  Positioned in recliner.  Pt wanted to get back to bed however advised him to stay up until after lunch.  Family is hopeful to have pt D/C to home.  Pt has a SW at home.  Will need a RW(family agree).  SATURATION QUALIFICATIONS: (This note is used to comply with regulatory documentation for home oxygen)  Patient Saturations on Room Air at Rest = 78%  Patient Saturations on Room Air while Ambulating = 74%  Patient Saturations on 3 Liters of oxygen while Ambulating = 88%  Please briefly explain why patient needs home oxygen: Pt has a hx COPD   Follow Up Recommendations  Home health PT;Supervision/Assistance - 24 hour     Does the patient have the potential to tolerate intense rehabilitation     Barriers to Discharge        Equipment Recommendations  Rolling walker with 5" wheels    Recommendations for Other Services    Frequency Min 3X/week   Plan      Precautions / Restrictions Precautions Precautions: Fall Precaution Comments: monitor O2 sats Restrictions Weight Bearing Restrictions: No       Mobility  Bed Mobility Bed Mobility: Supine to Sit Supine to Sit: 3: Mod assist Details for Bed Mobility Assistance: assist for trunk upright and increased time Transfers Transfers: Sit to Stand;Stand  to Sit Sit to Stand: 3: Mod assist;4: Min assist;From bed Stand to Sit: 3: Mod assist;4: Min assist;To chair/3-in-1 Details for Transfer Assistance: VC's for proper tech and hand placement Ambulation/Gait Ambulation/Gait Assistance: 4: Min assist Ambulation Distance (Feet): 14 Feet (7' x 2) Assistive device: Rolling walker Ambulation/Gait Assistance Details: Amb pt on 3 lts O2 sats decreased to upper 70's with HR avg 123 and max c/o fatigue and SOB.  Pt required cueing for purse lip breathing and demon 3/4 DOE. Gait Pattern: Step-to pattern;Trunk flexed;Step-through pattern Gait velocity: decreased     PT Goals                                              progressing    Visit Information  Last PT Received On: 07/13/12 Assistance Needed: +2    Subjective Data   I feel bad   Cognition    good   Balance   fair  End of Session PT - End of Session Equipment Utilized During Treatment: Gait belt;Oxygen Activity Tolerance: Patient limited by fatigue Patient left: in chair;with call bell/phone within reach;with family/visitor present Nurse Communication: Other (comment) (increased temp and HR)   Felecia Shelling  PTA [facility]  Acute  Rehab Pager      [phone]

## 2012-07-13 NOTE — Evaluation (Signed)
Occupational Therapy Evaluation Patient Details Name: [name] MRN: [id number] DOB: November 17, 1935 Today's Date: 07/13/2012 Time: 1478-2956 OT Time Calculation (min): 44 min  OT Assessment / Plan / Recommendation Clinical Impression   This 77 y.o. Male with h/o stage IIIB/IV NSCLCA admitted with nausea, vomiting and generalized weakness, and hypotension.  Pt has had a signficant decline in function over the past 6 weeks that family attributes to side effects of chemo.  Wife has been assisting him with almost all BADLs.   Pt does fatigue quickly with activity.  02 sats remained >92% on 4L for entire evaluation.  He will benefit from OT to maximize safety and independence with BADLs, to reduced burden of care on caregiver.       OT Assessment  Patient needs continued OT Services    Follow Up Recommendations  Home health OT;Supervision/Assistance - 24 hour    Barriers to Discharge None    Equipment Recommendations  Tub/shower bench    Recommendations for Other Services    Frequency  Min 2X/week    Precautions / Restrictions Precautions Precautions: Fall Precaution Comments: monitor BP Restrictions Weight Bearing Restrictions: No       ADL  Eating/Feeding: Independent Where Assessed - Eating/Feeding: Edge of bed;Chair Grooming: Wash/dry hands;Wash/dry face;Teeth care;Supervision/safety Where Assessed - Grooming: Unsupported sitting Upper Body Bathing: Minimal assistance Where Assessed - Upper Body Bathing: Unsupported sitting Lower Body Bathing: Maximal assistance Where Assessed - Lower Body Bathing: Supported sit to stand Upper Body Dressing: Moderate assistance Where Assessed - Upper Body Dressing: Unsupported sitting Lower Body Dressing: +1 Total assistance Where Assessed - Lower Body Dressing: Supported sit to stand Toilet Transfer: Minimal assistance Toilet Transfer Method: Sit to stand;Stand pivot Acupuncturist: Bedside commode Toileting - Clothing  Manipulation and Hygiene: Moderate assistance Where Assessed - Toileting Clothing Manipulation and Hygiene: Standing Equipment Used: Rolling walker Transfers/Ambulation Related to ADLs: Min a stand pivot transfer ADL Comments: Pt. reports feeling better this pm than he did earlier in day.  Pt. performed reaching activities both sitting and standing then fatigued.  Pt dtr anxious about sats; however, sats stayed 92% or greater on 4L    OT Diagnosis: Generalized weakness  OT Problem List: Decreased strength;Decreased activity tolerance;Impaired balance (sitting and/or standing);Decreased knowledge of use of DME or AE;Cardiopulmonary status limiting activity OT Treatment Interventions: Self-care/ADL training;DME and/or AE instruction;Therapeutic activities;Patient/family education;Balance training   OT Goals Acute Rehab OT Goals OT Goal Formulation: With patient/family Time For Goal Achievement: 07/27/12 Potential to Achieve Goals: Good ADL Goals Pt Will Perform Grooming: with supervision;Standing at sink ADL Goal: Grooming - Progress: Goal set today Pt Will Perform Lower Body Bathing: with min assist;Sit to stand from chair;Sit to stand from bed ADL Goal: Lower Body Bathing - Progress: Goal set today Pt Will Perform Upper Body Dressing: with supervision;Sitting, chair;Sitting, bed ADL Goal: Upper Body Dressing - Progress: Goal set today Pt Will Perform Lower Body Dressing: with min assist;Sit to stand from chair;Sit to stand from bed ADL Goal: Lower Body Dressing - Progress: Goal set today Pt Will Transfer to Toilet: Ambulation;with supervision;Comfort height toilet;3-in-1 ADL Goal: Toilet Transfer - Progress: Goal set today Pt Will Perform Toileting - Clothing Manipulation: with supervision;Standing ADL Goal: Toileting - Clothing Manipulation - Progress: Goal set today Pt Will Perform Tub/Shower Transfer: with supervision;Ambulation;Transfer tub bench ADL Goal: Tub/Shower Transfer -  Progress: Goal set today Additional ADL Goal #1: Pt will participate in 25 min therapeutic activities with no more than 2 rest breaks to  increase activity tolerance needed for ADLs ADL Goal: Additional Goal #1 - Progress: Goal set today  Visit Information  Last OT Received On: 07/13/12 Assistance Needed: +2 (for ambulation)    Subjective Data  Subjective: "I feel pretty good" Patient Stated Goal: To get better   Prior Functioning     Home Living Lives With: Spouse Available Help at Discharge: Family;Available 24 hours/day Type of Home: House Home Access: Stairs to enter Home Layout: Multi-level Alternate Level Stairs-Number of Steps: 5-6 throughout house all with one rail Alternate Level Stairs-Rails: Right Bathroom Shower/Tub: Tub/shower unit;Curtain Bathroom Toilet: Standard Home Adaptive Equipment: Walker - rolling;Straight cane;Shower chair with back;Bedside commode/3-in-1 Additional Comments: Able to borrow a wheelchair Prior Function Level of Independence: Needs assistance Needs Assistance: Bathing;Dressing;Meal Prep;Light Housekeeping;Toileting Bath: Maximal Dressing: Maximal Toileting: Minimal Meal Prep: Total Light Housekeeping: Total Able to Take Stairs?: Yes Comments: Pt with 2 recent falls; Wife assists with ADLs some days because he can't do it, and some days because it is more convenient.  Family and pt report fluctuations in status due to chemo side effects the past 6 weeks Communication Communication: No difficulties Dominant Hand: Right         Vision/Perception     Cognition  Cognition Arousal/Alertness: Awake/alert Behavior During Therapy: WFL for tasks assessed/performed Overall Cognitive Status: Within Functional Limits for tasks assessed    Extremity/Trunk Assessment Right Upper Extremity Assessment RUE ROM/Strength/Tone: Within functional levels RUE Coordination: WFL - gross/fine motor Left Upper Extremity Assessment LUE  ROM/Strength/Tone: Within functional levels LUE Coordination: WFL - gross/fine motor Trunk Assessment Trunk Assessment: Normal     Mobility Bed Mobility Bed Mobility: Supine to Sit;Sitting - Scoot to Edge of Bed Supine to Sit: With rails;4: Min assist Sitting - Scoot to [place] of Bed: 4: Min guard Details for Bed Mobility Assistance: assist to lift shoulders Transfers Transfers: Sit to Stand;Stand to Sit Sit to Stand: 4: Min assist;With upper extremity assist;From bed Stand to Sit: 4: Min assist;With upper extremity assist;To chair/3-in-1 Details for Transfer Assistance: Vc's for hand placement     Exercise     Balance Balance Balance Assessed: Yes Static Sitting Balance Static Sitting - Balance Support: Feet supported Static Sitting - Level of Assistance: 5: Stand by assistance Static Sitting - Comment/# of Minutes: 10 Dynamic Sitting Balance Dynamic Sitting - Balance Support: Feet supported Dynamic Sitting - Level of Assistance: 5: Stand by assistance Dynamic Sitting - Balance Activities: Reaching for objects Dynamic Standing Balance Dynamic Standing - Balance Support: Left upper extremity supported;Right upper extremity supported Dynamic Standing - Level of Assistance: 4: Min assist Dynamic Standing - Balance Activities: Reaching across midline;Reaching for objects Dynamic Standing - Comments: 5   End of Session OT - End of Session Activity Tolerance: Patient limited by fatigue Patient left: in chair;with call bell/phone within reach;with family/visitor present  GO     Jarrah Seher M 07/13/2012, 5:05 PM

## 2012-07-14 DIAGNOSIS — E871 Hypo-osmolality and hyponatremia: Secondary | ICD-10-CM

## 2012-07-14 DIAGNOSIS — E876 Hypokalemia: Secondary | ICD-10-CM

## 2012-07-14 LAB — BASIC METABOLIC PANEL
CO2: 24 mEq/L (ref 19–32)
Chloride: 99 mEq/L (ref 96–112)
Creatinine, Ser: 0.87 mg/dL (ref 0.50–1.35)
Sodium: 130 mEq/L — ABNORMAL LOW (ref 135–145)

## 2012-07-14 LAB — GLUCOSE, CAPILLARY
Glucose-Capillary: 106 mg/dL — ABNORMAL HIGH (ref 70–99)
Glucose-Capillary: 111 mg/dL — ABNORMAL HIGH (ref 70–99)
Glucose-Capillary: 208 mg/dL — ABNORMAL HIGH (ref 70–99)
Glucose-Capillary: 169 mg/dL — ABNORMAL HIGH (ref 70–99)

## 2012-07-14 MED ORDER — FLUCONAZOLE 200 MG PO TABS
200.0000 mg | ORAL_TABLET | Freq: Once | ORAL | Status: AC
  Administered 2012-07-14: 200 mg via ORAL
  Filled 2012-07-14: qty 1

## 2012-07-14 MED ORDER — POTASSIUM CHLORIDE CRYS ER 20 MEQ PO TBCR
40.0000 meq | EXTENDED_RELEASE_TABLET | ORAL | Status: AC
  Administered 2012-07-14 (×3): 40 meq via ORAL
  Filled 2012-07-14 (×5): qty 2

## 2012-07-14 MED ORDER — FLUCONAZOLE 100 MG PO TABS
100.0000 mg | ORAL_TABLET | Freq: Every day | ORAL | Status: AC
  Administered 2012-07-15 – 2012-07-18 (×4): 100 mg via ORAL
  Filled 2012-07-14 (×4): qty 1

## 2012-07-14 NOTE — Progress Notes (Signed)
[facility] PROGRESS NOTE  [name]:[id number] DOB: 10-30-1935 DOA: 07/11/2012 PCP: [name], MD Assessment/Plan: Present on Admission:  . Probable postobstructive PNA versus HCAP -continue empiric abx with with vanc and cefepime -no afebrile, cultures so far NGTD .Recent Thrush - on nystatin, add diflucan  .Fevers - Continue abx as above and diflucan -no further fevers overnight, follow . Hypotension - on admission thought to be 2/2 hypovolemia, improved with IVF.  infection may be factor here as well given fevers. bp stbale so far today and no further fevers . Lung cancer - as per Dr. Arbutus Ped  . Hyponatremia - this is chronic likely combination of SIADH and dehydration with follow on IV fluids.  . Abnormal ECG  -troponins are negative, follow up EKG  On 5/1 still with ST depression -continue monitoring on telemetry. Most likely EKG changes are due to demand ischemia due to hypotension on admission -echo with no wall motion abnormalities, EF 50-55% . Dehydration - IV fluids  . Failure to thrive/Protein Calorie malnutrition - likely 2/2 above, treat as above -appreciate nutrition recommendations, ensure complete supplements . Diabetes - continue covering with sliding scale insuline,  holding his po medications pending improvement of po intake . Debility - PT/OT recommending HH services .Hypokalemia -replace k    Code Status: full Family Communication: wife at bedside Disposition Plan: pending clinical course   Consultants:  onc  Procedures:  Echo  Study Conclusions  - Left ventricle: Systolic function was normal. The estimated ejection fraction was in the range of 50% to 55%. Wall motion was normal; there were no regional wall motion abnormalities. Doppler parameters are consistent with abnormal left ventricular relaxation (grade 1 diastolic dysfunction). Doppler parameters are consistent with high ventricular filling pressure. - Atrial  septum: No defect or patent foramen ovale was identified.   Antibiotics:  Vanc and cefepime - started on 5/1  HPI/Subjective: Pt denies any new c/o, per wife he ate a little more last pm  Objective: Filed Vitals:   07/14/12 0418 07/14/12 0753 07/14/12 1314 07/14/12 1948  BP: 101/47  118/41   Pulse: 99  109   Temp: 98.6 F (37 C)  99.5 F (37.5 C)   TempSrc: Oral  Oral   Resp: 18  18   Height:      Weight:      SpO2: 94% 90% 92% 92%    Intake/Output Summary (Last 24 hours) at 07/14/12 2027 Last data filed at 07/14/12 1818  Gross per 24 hour  Intake   3715 ml  Output      0 ml  Net   3715 ml   Filed Weights   07/12/12 0335  Weight: 81.8 kg (180 lb 5.4 oz)    Exam:   General:  Frail elderly male in NAD, alert and appropriate  Cardiovascular: RRR  Respiratory: decreased BS at bases, no wheezes  Abdomen: soft, +BS NT/ND  Extremities: no cyanosis and no edema   Data Reviewed: Basic Metabolic Panel:  Recent Labs Lab 07/10/12 0834 07/11/12 1729 07/12/12 0520 07/13/12 0325 07/14/12 0445  NA 133* 132* 135 132* 130*  K 4.8 4.3 3.6 3.2* 3.0*  CL 99 95* 102 100 99  CO2 21* 23 23 24 24   GLUCOSE 330* 222* 137* 178* 126*  BUN 39.6* 39* 24* 16 11  CREATININE 1.6* 1.36* 1.07 0.94 0.87  CALCIUM 9.2 9.2 7.9* 7.5* 7.3*  MG  --   --  1.7  --   --   PHOS  --   --  2.1*  --   --    Liver Function Tests:  Recent Labs Lab 07/10/12 0834 07/11/12 1729 07/12/12 0520  AST 11 24 12   ALT 17 18 13   ALKPHOS 81 136* 77  BILITOT 1.19 0.7 0.6  PROT 6.6 6.6 5.4*  ALBUMIN 2.7* 2.7* 2.2*   No results found for this basename: LIPASE, AMYLASE,  in the last 168 hours No results found for this basename: AMMONIA,  in the last 168 hours CBC:  Recent Labs Lab 07/10/12 0834 07/11/12 1729 07/12/12 0520  WBC 8.2 12.6* 9.5  NEUTROABS 7.3* 11.5*  --   HGB 10.6* 9.5* 8.2*  HCT 31.5* 27.9* 24.8*  MCV 85.4 85.6 87.0  PLT 152 135* 107*   Cardiac Enzymes:  Recent  Labs Lab 07/11/12 1815 07/11/12 2337 07/12/12 0520 07/12/12 1046 07/12/12 1725  CKTOTAL 21  --   --   --   --   TROPONINI <0.30 <0.30 <0.30 <0.30 <0.30   BNP (last 3 results) No results found for this basename: PROBNP,  in the last 8760 hours CBG:  Recent Labs Lab 07/13/12 1705 07/13/12 2134 07/14/12 0744 07/14/12 1135 07/14/12 1638  GLUCAP 156* 106* 111* 147* 208*    Recent Results (from the past 240 hour(s))  CULTURE, BLOOD (ROUTINE X 2)     Status: None   Collection Time    07/12/12  5:10 AM      Result Value Range Status   Specimen Description BLOOD LEFT HAND   Final   Special Requests BOTTLES DRAWN AEROBIC AND ANAEROBIC 5CC   Final   Culture  Setup Time 07/12/2012 10:05   Final   Culture     Final   Value:        BLOOD CULTURE RECEIVED NO GROWTH TO DATE CULTURE WILL BE HELD FOR 5 DAYS BEFORE ISSUING A FINAL NEGATIVE REPORT   Report Status PENDING   Incomplete  CULTURE, BLOOD (ROUTINE X 2)     Status: None   Collection Time    07/12/12  5:15 AM      Result Value Range Status   Specimen Description BLOOD RIGHT ANTECUBITAL   Final   Special Requests BOTTLES DRAWN AEROBIC ONLY 2CC   Final   Culture  Setup Time 07/12/2012 10:07   Final   Culture     Final   Value:        BLOOD CULTURE RECEIVED NO GROWTH TO DATE CULTURE WILL BE HELD FOR 5 DAYS BEFORE ISSUING A FINAL NEGATIVE REPORT   Report Status PENDING   Incomplete     Studies: No results found.  Scheduled Meds: . ceFEPime (MAXIPIME) IV  1 g Intravenous Q12H  . docusate sodium  100 mg Oral BID  . enoxaparin (LOVENOX) injection  40 mg Subcutaneous Q24H  . feeding supplement  237 mL Oral QID  . [START ON 07/15/2012] fluconazole  100 mg Oral Daily  . guaiFENesin  600 mg Oral BID  . insulin aspart  0-9 Units Subcutaneous TID AC & HS  . ipratropium  0.5 mg Nebulization Q6H  . latanoprost  1 drop Both Eyes QHS  . nystatin  5 mL Mouth/Throat QID  . simvastatin  20 mg Oral q1800  . sodium chloride  3 mL  Intravenous Q12H  . vancomycin  1,000 mg Intravenous Q12H   Continuous Infusions: . sodium chloride 1,000 mL (07/14/12 1431)    Active Problems:   Lung cancer   Hypotension   Hyponatremia   Abnormal ECG  Dehydration   Failure to thrive   Diabetes   Debility    Time spent:      Kela Millin  [facility] Pager [phone]. If 7PM-7AM, please contact night-coverage at [email or web address], password [facility] 07/14/2012, 8:27 PM  LOS: 3 days

## 2012-07-15 LAB — GLUCOSE, CAPILLARY
Glucose-Capillary: 160 mg/dL — ABNORMAL HIGH (ref 70–99)
Glucose-Capillary: 148 mg/dL — ABNORMAL HIGH (ref 70–99)
Glucose-Capillary: 128 mg/dL — ABNORMAL HIGH (ref 70–99)

## 2012-07-15 LAB — BASIC METABOLIC PANEL
Calcium: 7.5 mg/dL — ABNORMAL LOW (ref 8.4–10.5)
GFR calc non Af Amer: 84 mL/min — ABNORMAL LOW (ref >90)
Sodium: 131 mEq/L — ABNORMAL LOW (ref 135–145)

## 2012-07-15 LAB — VANCOMYCIN, TROUGH: Vancomycin Tr: 15.7 ug/mL (ref 10.0–20.0)

## 2012-07-15 MED ORDER — GI COCKTAIL ~~LOC~~
30.0000 mL | Freq: Two times a day (BID) | ORAL | Status: DC | PRN
  Administered 2012-07-15 – 2012-07-17 (×2): 30 mL via ORAL
  Filled 2012-07-15 (×2): qty 30

## 2012-07-15 MED ORDER — PANTOPRAZOLE SODIUM 40 MG PO TBEC
40.0000 mg | DELAYED_RELEASE_TABLET | Freq: Every day | ORAL | Status: DC
  Administered 2012-07-15 – 2012-07-22 (×8): 40 mg via ORAL
  Filled 2012-07-15 (×9): qty 1

## 2012-07-15 MED ORDER — FUROSEMIDE 10 MG/ML IJ SOLN
20.0000 mg | Freq: Once | INTRAMUSCULAR | Status: AC
  Administered 2012-07-15: 20 mg via INTRAVENOUS

## 2012-07-15 MED ORDER — SODIUM CHLORIDE 0.9 % IV SOLN
1000.0000 mL | INTRAVENOUS | Status: DC
  Administered 2012-07-15: 1000 mL via INTRAVENOUS

## 2012-07-15 MED ORDER — ALBUTEROL SULFATE (5 MG/ML) 0.5% IN NEBU
2.5000 mg | INHALATION_SOLUTION | Freq: Two times a day (BID) | RESPIRATORY_TRACT | Status: DC
  Administered 2012-07-15 – 2012-07-20 (×12): 2.5 mg via RESPIRATORY_TRACT
  Filled 2012-07-15 (×11): qty 0.5

## 2012-07-15 MED ORDER — SODIUM CHLORIDE 0.9 % IV SOLN
1000.0000 mL | INTRAVENOUS | Status: DC
  Administered 2012-07-15 – 2012-07-18 (×6): 1000 mL via INTRAVENOUS

## 2012-07-15 MED ORDER — FUROSEMIDE 10 MG/ML IJ SOLN
INTRAMUSCULAR | Status: AC
  Filled 2012-07-15: qty 4

## 2012-07-15 MED ORDER — TIOTROPIUM BROMIDE MONOHYDRATE 18 MCG IN CAPS
18.0000 ug | ORAL_CAPSULE | Freq: Every day | RESPIRATORY_TRACT | Status: DC
  Administered 2012-07-15 – 2012-07-22 (×8): 18 ug via RESPIRATORY_TRACT
  Filled 2012-07-15 (×2): qty 5

## 2012-07-15 NOTE — Progress Notes (Addendum)
ANTIBIOTIC CONSULT NOTE - Follow Up  Pharmacy Consult for Vancomycin, Cefepime Indication: Suspected Pneumonia  Allergies  Allergen Reactions  . Codeine Other (See Comments)    Made pt unrepsonsive  . Penicillins     Patient Measurements: Height: 5\' 10"  (177.8 cm) Weight: 180 lb 5.4 oz (81.8 kg) IBW/kg (Calculated) : 73  Vital Signs: Temp: 98.4 F (36.9 C) (05/04 0421) Temp src: Oral (05/04 0421) BP: 103/55 mmHg (05/04 0421) Pulse Rate: 102 (05/04 0421)  Labs:  Recent Labs  07/13/12 0325 07/14/12 0445 07/15/12 0350  CREATININE 0.94 0.87 0.82   Estimated Creatinine Clearance: 80.4 ml/min (by C-G formula based on Cr of 0.82).  Assessment: 77 yo M with h/o NSCLC currently receiving chemotherapy with Taxotere q21d (C2 completed 4/22 with Neulasta on 4/23) presented 4/30 with c/o weakness.  CXR 4/30 reads postobstructive pneumonia associated with left lower lobe mass. Patient started on Vanco and cefepime on 07/12/12.  Today is Day #4 Vanco 1g IV q12h and cefepime 1g IV q12h  Will check Vanco trough at 13:30 today   Goal of Therapy:  Vancomycin trough level 15-20 mcg/ml  Plan:  Vanco trough at 13:30 today - pharmacy will f/u  Darrol Angel, PharmD Pager: [phone] 07/15/2012 11:53 AM  ADDENDUM: Vanco trough therapeutic (15.7). Continue current regimen.  Darrol Angel, PharmD Pager: [phone] 07/15/2012 3:16 PM

## 2012-07-15 NOTE — Progress Notes (Signed)
[facility] PROGRESS NOTE  [name]:[id number] DOB: Dec 03, 1935 DOA: 07/11/2012 PCP: [name], MD Assessment/Plan: Present on Admission:  . Probable postobstructive PNA versus HCAP -continue empiric abx with with vanc and cefepime -no afebrile, cultures so far NGTD -give dose of lasix and decrease IVF for now given SOB and follow .Recent Thrush - on nystatin, add diflucan  .Fevers -secondary to above - Continue abx as above and diflucan -no further fevers overnight, follow . Hypotension - on admission thought to be 2/2 hypovolemia, improved with IVF.  infection may be factor here as well given fevers. bp stbale so far today and no further fevers . Lung cancer - as per Dr. Arbutus Ped  . Hyponatremia - this is chronic likely combination of SIADH and dehydration with follow on IV fluids.  . Abnormal ECG  -troponins are negative, follow up EKG  On 5/1 still with ST depression -continue monitoring on telemetry. Most likely EKG changes are due to demand ischemia due to hypotension on admission -echo with no wall motion abnormalities, EF 50-55% . Dehydration - IV fluids as above . Failure to thrive/Protein Calorie malnutrition - likely 2/2 above, treat as above -appreciate nutrition recommendations, ensure complete supplements . Diabetes - continue covering with sliding scale insuline,  holding his po medications pending improvement of po intake . Debility - PT/OT recommending HH services .Hypokalemia -resolved    Code Status: full Family Communication: wife at bedside Disposition Plan: pending clinical course   Consultants:  onc  Procedures:  Echo  Study Conclusions  - Left ventricle: Systolic function was normal. The estimated ejection fraction was in the range of 50% to 55%. Wall motion was normal; there were no regional wall motion abnormalities. Doppler parameters are consistent with abnormal left ventricular relaxation (grade 1 diastolic  dysfunction). Doppler parameters are consistent with high ventricular filling pressure. - Atrial septum: No defect or patent foramen ovale was identified.   Antibiotics:  Vanc and cefepime - started on 5/1  HPI/Subjective: Some SOB this am, seems to be doing better with full liquids per wife  Objective: Filed Vitals:   07/14/12 1948 07/14/12 2038 07/15/12 0421 07/15/12 0758  BP:  97/47 103/55   Pulse:  95 102   Temp:  98.1 F (36.7 C) 98.4 F (36.9 C)   TempSrc:  Oral Oral   Resp:  18 18   Height:      Weight:      SpO2: 92% 95% 95% 94%    Intake/Output Summary (Last 24 hours) at 07/15/12 0940 Last data filed at 07/15/12 0600  Gross per 24 hour  Intake 3452.5 ml  Output      0 ml  Net 3452.5 ml   Filed Weights   07/12/12 0335  Weight: 81.8 kg (180 lb 5.4 oz)    Exam:   General:  Frail elderly male in NAD, alert and appropriate  Cardiovascular: RRR  Respiratory: decreased BS at bases, no wheezes  Abdomen: soft, +BS NT/ND  Extremities: no cyanosis and no edema   Data Reviewed: Basic Metabolic Panel:  Recent Labs Lab 07/11/12 1729 07/12/12 0520 07/13/12 0325 07/14/12 0445 07/15/12 0350  NA 132* 135 132* 130* 131*  K 4.3 3.6 3.2* 3.0* 4.0  CL 95* 102 100 99 98  CO2 23 23 24 24 23   GLUCOSE 222* 137* 178* 126* 188*  BUN 39* 24* 16 11 10   CREATININE 1.36* 1.07 0.94 0.87 0.82  CALCIUM 9.2 7.9* 7.5* 7.3* 7.5*  MG  --  1.7  --   --   --  PHOS  --  2.1*  --   --   --    Liver Function Tests:  Recent Labs Lab 07/10/12 0834 07/11/12 1729 07/12/12 0520  AST 11 24 12   ALT 17 18 13   ALKPHOS 81 136* 77  BILITOT 1.19 0.7 0.6  PROT 6.6 6.6 5.4*  ALBUMIN 2.7* 2.7* 2.2*   No results found for this basename: LIPASE, AMYLASE,  in the last 168 hours No results found for this basename: AMMONIA,  in the last 168 hours CBC:  Recent Labs Lab 07/10/12 0834 07/11/12 1729 07/12/12 0520  WBC 8.2 12.6* 9.5  NEUTROABS 7.3* 11.5*  --   HGB 10.6* 9.5*  8.2*  HCT 31.5* 27.9* 24.8*  MCV 85.4 85.6 87.0  PLT 152 135* 107*   Cardiac Enzymes:  Recent Labs Lab 07/11/12 1815 07/11/12 2337 07/12/12 0520 07/12/12 1046 07/12/12 1725  CKTOTAL 21  --   --   --   --   TROPONINI <0.30 <0.30 <0.30 <0.30 <0.30   BNP (last 3 results) No results found for this basename: PROBNP,  in the last 8760 hours CBG:  Recent Labs Lab 07/14/12 0744 07/14/12 1135 07/14/12 1638 07/14/12 2036 07/15/12 0740  GLUCAP 111* 147* 208* 169* 160*    Recent Results (from the past 240 hour(s))  CULTURE, BLOOD (ROUTINE X 2)     Status: None   Collection Time    07/12/12  5:10 AM      Result Value Range Status   Specimen Description BLOOD LEFT HAND   Final   Special Requests BOTTLES DRAWN AEROBIC AND ANAEROBIC 5CC   Final   Culture  Setup Time 07/12/2012 10:05   Final   Culture     Final   Value:        BLOOD CULTURE RECEIVED NO GROWTH TO DATE CULTURE WILL BE HELD FOR 5 DAYS BEFORE ISSUING A FINAL NEGATIVE REPORT   Report Status PENDING   Incomplete  CULTURE, BLOOD (ROUTINE X 2)     Status: None   Collection Time    07/12/12  5:15 AM      Result Value Range Status   Specimen Description BLOOD RIGHT ANTECUBITAL   Final   Special Requests BOTTLES DRAWN AEROBIC ONLY 2CC   Final   Culture  Setup Time 07/12/2012 10:07   Final   Culture     Final   Value:        BLOOD CULTURE RECEIVED NO GROWTH TO DATE CULTURE WILL BE HELD FOR 5 DAYS BEFORE ISSUING A FINAL NEGATIVE REPORT   Report Status PENDING   Incomplete     Studies: No results found.  Scheduled Meds: . albuterol  2.5 mg Nebulization BID  . ceFEPime (MAXIPIME) IV  1 g Intravenous Q12H  . docusate sodium  100 mg Oral BID  . enoxaparin (LOVENOX) injection  40 mg Subcutaneous Q24H  . feeding supplement  237 mL Oral QID  . fluconazole  100 mg Oral Daily  . guaiFENesin  600 mg Oral BID  . insulin aspart  0-9 Units Subcutaneous TID AC & HS  . latanoprost  1 drop Both Eyes QHS  . nystatin  5 mL  Mouth/Throat QID  . simvastatin  20 mg Oral q1800  . sodium chloride  3 mL Intravenous Q12H  . tiotropium  18 mcg Inhalation Daily  . vancomycin  1,000 mg Intravenous Q12H   Continuous Infusions: . sodium chloride 1,000 mL (07/15/12 0000)    Active Problems:   Lung  cancer   Hypotension   Hyponatremia   Abnormal ECG   Dehydration   Failure to thrive   Diabetes   Debility    Time spent:      Kela Millin  [facility] Pager [phone]. If 7PM-7AM, please contact night-coverage at [email or web address], password [facility] 07/15/2012, 9:40 AM  LOS: 4 days

## 2012-07-15 NOTE — Progress Notes (Signed)
Pt with new onset of multiple episodes of diarrhea this am. MD notified. Contact precautions initiated, C Diff PCR ordered. Bonham Zingale, Lavone Orn, RN

## 2012-07-16 LAB — BASIC METABOLIC PANEL
Calcium: 7.4 mg/dL — ABNORMAL LOW (ref 8.4–10.5)
GFR calc Af Amer: >90 mL/min (ref >90)
GFR calc non Af Amer: 84 mL/min — ABNORMAL LOW (ref >90)
Glucose, Bld: 137 mg/dL — ABNORMAL HIGH (ref 70–99)
Sodium: 133 mEq/L — ABNORMAL LOW (ref 135–145)

## 2012-07-16 LAB — CBC
MCV: 86.2 fL (ref 78.0–100.0)
Platelets: 102 10*3/uL — ABNORMAL LOW (ref 150–400)
RDW: 17.3 % — ABNORMAL HIGH (ref 11.5–15.5)
WBC: 5.5 10*3/uL (ref 4.0–10.5)

## 2012-07-16 LAB — CLOSTRIDIUM DIFFICILE BY PCR: Toxigenic C. Difficile by PCR: NEGATIVE

## 2012-07-16 LAB — PREPARE RBC (CROSSMATCH)

## 2012-07-16 MED ORDER — DIPHENOXYLATE-ATROPINE 2.5-0.025 MG PO TABS
1.0000 | ORAL_TABLET | Freq: Four times a day (QID) | ORAL | Status: DC
  Administered 2012-07-16 – 2012-07-17 (×7): 1 via ORAL
  Filled 2012-07-16 (×7): qty 1

## 2012-07-16 MED ORDER — POTASSIUM CHLORIDE 20 MEQ/15ML (10%) PO LIQD
40.0000 meq | ORAL | Status: AC
  Administered 2012-07-16 (×2): 40 meq via ORAL
  Filled 2012-07-16 (×2): qty 30

## 2012-07-16 NOTE — Progress Notes (Signed)
PT Cancellation Note  _X_Treatment cancelled today due to medical issues with patient which prohibited therapy.......Marland Kitchenhemoglobin 6.4  ___ Treatment cancelled today due to patient receiving procedure or test   ___ Treatment cancelled today due to patient's refusal to participate   ___ Treatment cancelled today due to   [name]  PTA [facility]  Acute  Rehab Pager      [phone]

## 2012-07-16 NOTE — Progress Notes (Signed)
[facility] PROGRESS NOTE  [name] RUE:[id number] DOB: Apr 23, 1935 DOA: 07/11/2012 PCP: [name], MD Assessment/Plan: Present on Admission:  . Probable postobstructive PNA versus HCAP -continue empiric abx with with vanc and cefepime -no afebrile, cultures so far NGTD -give dose of lasix and decrease IVF for now given SOB and follow .Recent Thrush - continue nystatin, diflucan  .Fevers -secondary to above - Continue abx as above and diflucan -no further fevers pm of 5/2, follow . Hypotension - on admission thought to be 2/2 hypovolemia, improved with IVF.  infection may be factor here as well given fevers. bp stable so far today and no further fevers . Lung cancer - as per Dr. Arbutus Ped  . 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  . Abnormal ECG  -troponins are negative, follow up EKG  On 5/1 still with ST depression -continue monitoring on telemetry. Most likely EKG changes are due to demand ischemia due to hypotension on admission -echo with no wall motion abnormalities, EF 50-55% . Dehydration - IV fluids as above . Failure to thrive/Protein Calorie malnutrition - likely 2/2 above, treat as above -appreciate nutrition recommendations, ensure complete supplements . Diabetes - continue covering with sliding scale insulin,  holding his po medications pending improvement of po intake . Debility - PT/OT recommending HH services .Hypokalemia -replace k    Code Status: full Family Communication: wife at bedside Disposition Plan: pending clinical course   Consultants:  onc  Procedures:  Echo  Study Conclusions  - Left ventricle: Systolic function was normal. The estimated ejection fraction was in the range of 50% to 55%. Wall motion was normal; there were no regional wall motion abnormalities. Doppler parameters are consistent with abnormal left ventricular relaxation (grade 1 diastolic  dysfunction). Doppler parameters are consistent with high ventricular filling pressure. - Atrial septum: No defect or patent foramen ovale was identified.   Antibiotics:  Vanc and cefepime - started on 5/1  HPI/Subjective: Breathing better this a.m., some diarrhea but less today. Requesting diet change to solids.  Objective: Filed Vitals:   07/15/12 2157 07/16/12 0500 07/16/12 0950 07/16/12 1346  BP: 98/51 101/51  94/50  Pulse: 92 92  91  Temp: 98 F (36.7 C) 98.1 F (36.7 C)  99.4 F (37.4 C)  TempSrc: Oral Oral  Oral  Resp: 20 20  18   Height:      Weight:      SpO2: 95% 97% 97% 95%    Intake/Output Summary (Last 24 hours) at 07/16/12 1525 Last data filed at 07/16/12 0700  Gross per 24 hour  Intake 1645.42 ml  Output    175 ml  Net 1470.42 ml   Filed Weights   07/12/12 0335  Weight: 81.8 kg (180 lb 5.4 oz)    Exam:   General:  Frail elderly male in NAD, alert and appropriate  Cardiovascular: RRR  Respiratory: decreased BS at bases, no wheezes  Abdomen: soft, +BS NT/ND  Extremities: no cyanosis and no edema   Data Reviewed: Basic Metabolic Panel:  Recent Labs Lab 07/12/12 0520 07/13/12 0325 07/14/12 0445 07/15/12 0350 07/16/12 0345  NA 135 132* 130* 131* 133*  K 3.6 3.2* 3.0* 4.0 3.3*  CL 102 100 99 98 100  CO2 23 24 24 23 26   GLUCOSE 137* 178* 126* 188* 137*  BUN 24* 16 11 10 9   CREATININE 1.07 0.94 0.87 0.82 0.84  CALCIUM 7.9* 7.5* 7.3* 7.5* 7.4*  MG 1.7  --   --   --   --  PHOS 2.1*  --   --   --   --    Liver Function Tests:  Recent Labs Lab 07/10/12 0834 07/11/12 1729 07/12/12 0520  AST 11 24 12   ALT 17 18 13   ALKPHOS 81 136* 77  BILITOT 1.19 0.7 0.6  PROT 6.6 6.6 5.4*  ALBUMIN 2.7* 2.7* 2.2*   No results found for this basename: LIPASE, AMYLASE,  in the last 168 hours No results found for this basename: AMMONIA,  in the last 168 hours CBC:  Recent Labs Lab 07/10/12 0834 07/11/12 1729 07/12/12 0520 07/16/12 0345   WBC 8.2 12.6* 9.5 5.5  NEUTROABS 7.3* 11.5*  --   --   HGB 10.6* 9.5* 8.2* 6.4*  HCT 31.5* 27.9* 24.8* 19.3*  MCV 85.4 85.6 87.0 86.2  PLT 152 135* 107* 102*   Cardiac Enzymes:  Recent Labs Lab 07/11/12 1815 07/11/12 2337 07/12/12 0520 07/12/12 1046 07/12/12 1725  CKTOTAL 21  --   --   --   --   TROPONINI <0.30 <0.30 <0.30 <0.30 <0.30   BNP (last 3 results) No results found for this basename: PROBNP,  in the last 8760 hours CBG:  Recent Labs Lab 07/15/12 1209 07/15/12 1706 07/15/12 2156 07/16/12 0755 07/16/12 1128  GLUCAP 148* 146* 128* 115* 155*    Recent Results (from the past 240 hour(s))  CULTURE, BLOOD (ROUTINE X 2)     Status: None   Collection Time    07/12/12  5:10 AM      Result Value Range Status   Specimen Description BLOOD LEFT HAND   Final   Special Requests BOTTLES DRAWN AEROBIC AND ANAEROBIC 5CC   Final   Culture  Setup Time 07/12/2012 10:05   Final   Culture     Final   Value:        BLOOD CULTURE RECEIVED NO GROWTH TO DATE CULTURE WILL BE HELD FOR 5 DAYS BEFORE ISSUING A FINAL NEGATIVE REPORT   Report Status PENDING   Incomplete  CULTURE, BLOOD (ROUTINE X 2)     Status: None   Collection Time    07/12/12  5:15 AM      Result Value Range Status   Specimen Description BLOOD RIGHT ANTECUBITAL   Final   Special Requests BOTTLES DRAWN AEROBIC ONLY 2CC   Final   Culture  Setup Time 07/12/2012 10:07   Final   Culture     Final   Value:        BLOOD CULTURE RECEIVED NO GROWTH TO DATE CULTURE WILL BE HELD FOR 5 DAYS BEFORE ISSUING A FINAL NEGATIVE REPORT   Report Status PENDING   Incomplete  CLOSTRIDIUM DIFFICILE BY PCR     Status: None   Collection Time    07/15/12  4:39 PM      Result Value Range Status   C difficile by pcr NEGATIVE  NEGATIVE Final     Studies: No results found.  Scheduled Meds: . albuterol  2.5 mg Nebulization BID  . ceFEPime (MAXIPIME) IV  1 g Intravenous Q12H  . diphenoxylate-atropine  1 tablet Oral QID  . docusate  sodium  100 mg Oral BID  . enoxaparin (LOVENOX) injection  40 mg Subcutaneous Q24H  . feeding supplement  237 mL Oral QID  . fluconazole  100 mg Oral Daily  . guaiFENesin  600 mg Oral BID  . insulin aspart  0-9 Units Subcutaneous TID AC & HS  . latanoprost  1 drop Both Eyes QHS  .  nystatin  5 mL Mouth/Throat QID  . pantoprazole  40 mg Oral Daily  . simvastatin  20 mg Oral q1800  . sodium chloride  3 mL Intravenous Q12H  . tiotropium  18 mcg Inhalation Daily  . vancomycin  1,000 mg Intravenous Q12H   Continuous Infusions: . sodium chloride 1,000 mL (07/16/12 0806)    Active Problems:   Lung cancer   Hypotension   Hyponatremia   Abnormal ECG   Dehydration   Failure to thrive   Diabetes   Debility    Time spent:      Kela Millin  [facility] Pager [phone]. If 7PM-7AM, please contact night-coverage at [email or web address], password [facility] 07/16/2012, 3:25 PM  LOS: 5 days

## 2012-07-17 ENCOUNTER — Other Ambulatory Visit: Payer: Medicare Other

## 2012-07-17 LAB — BASIC METABOLIC PANEL
BUN: 6 mg/dL (ref 6–23)
Calcium: 7.5 mg/dL — ABNORMAL LOW (ref 8.4–10.5)
GFR calc non Af Amer: 85 mL/min — ABNORMAL LOW (ref >90)
Glucose, Bld: 151 mg/dL — ABNORMAL HIGH (ref 70–99)

## 2012-07-17 LAB — GLUCOSE, CAPILLARY
Glucose-Capillary: 161 mg/dL — ABNORMAL HIGH (ref 70–99)
Glucose-Capillary: 132 mg/dL — ABNORMAL HIGH (ref 70–99)

## 2012-07-17 NOTE — Progress Notes (Signed)
OT Cancellation Note  Patient Details Name: [name] MRN: [id number] DOB: June 21, 1935   Cancelled Treatment:    Reason Eval/Treat Not Completed: Medical issues which prohibited therapy;Other (comment) (hemoglobin 6.4)  Nadalie Laughner, [place]ung 07/17/2012, 10:52 AM

## 2012-07-17 NOTE — Progress Notes (Signed)
Physical Therapy Treatment Patient Details Name: [name] MRN: [id number] DOB: 24-Jan-1936 Today's Date: 07/17/2012 Time: 5621-3086 PT Time Calculation (min): 29 min  PT Assessment / Plan / Recommendation Comments on Treatment Session  Pt received blood last night/early this am and feeling better.  Pt eager to "go home".  Pt sat EOB at Supervision level. Amb limited distance 2nd SOB and decrease in sats.  Amb pt on 4 lts today vs 3 the day before. Pt requires freq rest breaks and cueing on purse lip breathing and coughing.Pt plans to D/C to home with family support.   Pt has a SW at home but will need a RW (pt/family agree). Pt will need home O2.    Follow Up Recommendations  Home health PT;Supervision/Assistance - 24 hour     Does the patient have the potential to tolerate intense rehabilitation     Barriers to Discharge        Equipment Recommendations  Rolling walker with 5" wheels    Recommendations for Other Services    Frequency Min 3X/week   Plan      Precautions / Restrictions Precautions Precautions: Fall Restrictions Weight Bearing Restrictions: No   Pertinent Vitals/Pain No c/o pain    Mobility  Bed Mobility Bed Mobility: Supine to Sit Sitting - Scoot to Edge of Bed: 5: Supervision Details for Bed Mobility Assistance: Pt able to get to EOB @ Supervision level Transfers Transfers: Sit to Stand;Stand to Sit Sit to Stand: 4: Min assist;4: Min guard;From bed Stand to Sit: 4: Min guard;4: Min assist Details for Transfer Assistance: 25% VC's for hand placement esp for stand to sit Ambulation/Gait Ambulation/Gait Assistance: 4: Min assist;4: Min [employer] social research officer (Feet): 65 Feet (40' then 25") Assistive device: Rolling walker Ambulation/Gait Assistance Details: Amb pt on 4 lts O2 sats avg 85% lowest 81% with HR 109 and pt demon 3/4 DOE.  Amb pt twice with one sitting rest break. Pt instructed on purse lip breathing and coughing. Gait Pattern: Step-to  pattern;Trunk flexed;Step-through pattern Gait velocity: decreased     PT Goals                                         progressing    Visit Information  Last PT Received On: 07/17/12 Assistance Needed: +2    Subjective Data      Cognition    good   Balance   fair depending on level of fatigue  End of Session PT - End of Session Equipment Utilized During Treatment: Gait belt;Oxygen Activity Tolerance: Patient limited by fatigue   Felecia Shelling  PTA [facility]  Acute  Rehab Pager      [phone]

## 2012-07-17 NOTE — Progress Notes (Addendum)
[facility] PROGRESS NOTE  [name] NWG:[id number] DOB: 02/15/1936 DOA: 07/11/2012 PCP: [name], MD  Interim Summary: Pt is a 77yo with past medical history of Lung cancer (2012); Diabetes mellitus; HTN (hypertension); High cholesterol; radiation therapy (09/22/10 - 11/05/10); History of chemotherapy; and History of radiation therapy (04/24/12-05/15/12) currently undergoing chemotherapy for Stage IIIB/IV non-small cell lung cancer and presented with increasing nausea or vomiting and worsening generalized weakness. On admission it was noted that patient was hypotensive in the ED but this responded to IV fluids. Urinalysis in the ED was negative for infection, and chest x-ray showed mass effect and consolidation at the left lung base similar to prior x-rays but possible post obstructive pneumonia associated with left lower lobe mass not ruled out. Following admission he developed fever to 101.8, and given the above x-ray findings was empirically started on antibiotics for possible postobstructive PNA versus HCAP.  Pt had lat ST depression on EKG with no CP, CEs were cycled and came back neg>>echo with no wall motion abnormality.      Assessment/Plan: Present on Admission:  . Probable postobstructive PNA versus HCAP -pt was placed empiric abx with with vanc and cefepime following admission and also received dose of lasix and decrease IVF -he defervesed, cultures so far NGTD -will narrow abx, change to levaquin PO for days of abx -if remains afebrile on PO abx plan d/c soon .Recent Thrush  - continue nystatin, diflucan for(47more day) .Fevers -secondary to above, resolved - Continue abx for days as above and diflucan for day -no further fevers since pm of 5/2, follow . Hypotension - on admission thought to be 2/2 hypovolemia, improved with IVF.  infection may be factor here as well given fevers. bp stable so far today and no further fevers . Lung cancer - as  per Dr. Arbutus Ped  . 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  . Abnormal ECG  -troponins are negative, follow up EKG  On 5/1 still with ST depression -continue monitoring on telemetry. Most likely EKG changes are due to demand ischemia due to hypotension on admission -echo with no wall motion abnormalities, EF 50-55% . Dehydration - IV fluids as above . Failure to thrive/Protein Calorie malnutrition - likely 2/2 above, treat as above -appreciate nutrition recommendations, ensure complete supplements . Diabetes - continue covering with sliding scale insulin,  holding his po medications pending improvement of po intake . Debility - PT/OT recommending HH services .Hypokalemia -likely secondary to GI losses, resolved .Diarrhea -C.diff neg, continue antidiarrheal as needed only     Code Status: full Family Communication: wife and daughter at bedside Disposition Plan: pending clinical course   Consultants:  onc  Procedures:  Echo  Study Conclusions  - Left ventricle: Systolic function was normal. The estimated ejection fraction was in the range of 50% to 55%. Wall motion was normal; there were no regional wall motion abnormalities. Doppler parameters are consistent with abnormal left ventricular relaxation (grade 1 diastolic dysfunction). Doppler parameters are consistent with high ventricular filling pressure. - Atrial septum: No defect or patent foramen ovale was identified.   Antibiotics:  Vanc and cefepime - started on 5/1  diflucan 5/3-5/7  HPI/Subjective: Denies diarrhea today, per family eating better  Objective: Filed Vitals:   07/17/12 0645 07/17/12 0743 07/17/12 0800 07/17/12 1435  BP: 98/53  95/54 107/57  Pulse: 86  92 90  Temp: 98.5 F (36.9 C)  98.3 F (36.8 C) 98.7  F (37.1 C)  TempSrc: Oral  Oral Oral  Resp: 18  18 16   Height:      Weight:      SpO2:  96%  100%    Intake/Output  Summary (Last 24 hours) at 07/17/12 1522 Last data filed at 07/17/12 1300  Gross per 24 hour  Intake 2553.75 ml  Output    100 ml  Net 2453.75 ml   Filed Weights   07/12/12 0335  Weight: 81.8 kg (180 lb 5.4 oz)    Exam:   General:  Frail elderly male in NAD, alert and appropriate  Cardiovascular: RRR  Respiratory: decreased BS at bases, no wheezes  Abdomen: soft, +BS NT/ND  Extremities: no cyanosis and no edema   Data Reviewed: Basic Metabolic Panel:  Recent Labs Lab 07/12/12 0520 07/13/12 0325 07/14/12 0445 07/15/12 0350 07/16/12 0345 07/17/12 0435  NA 135 132* 130* 131* 133* 133*  K 3.6 3.2* 3.0* 4.0 3.3* 3.8  CL 102 100 99 98 100 100  CO2 23 24 24 23 26 26   GLUCOSE 137* 178* 126* 188* 137* 151*  BUN 24* 16 11 10 9 6   CREATININE 1.07 0.94 0.87 0.82 0.84 0.80  CALCIUM 7.9* 7.5* 7.3* 7.5* 7.4* 7.5*  MG 1.7  --   --   --   --   --   PHOS 2.1*  --   --   --   --   --    Liver Function Tests:  Recent Labs Lab 07/11/12 1729 07/12/12 0520  AST 24 12  ALT 18 13  ALKPHOS 136* 77  BILITOT 0.7 0.6  PROT 6.6 5.4*  ALBUMIN 2.7* 2.2*   No results found for this basename: LIPASE, AMYLASE,  in the last 168 hours No results found for this basename: AMMONIA,  in the last 168 hours CBC:  Recent Labs Lab 07/11/12 1729 07/12/12 0520 07/16/12 0345  WBC 12.6* 9.5 5.5  NEUTROABS 11.5*  --   --   HGB 9.5* 8.2* 6.4*  HCT 27.9* 24.8* 19.3*  MCV 85.6 87.0 86.2  PLT 135* 107* 102*   Cardiac Enzymes:  Recent Labs Lab 07/11/12 1815 07/11/12 2337 07/12/12 0520 07/12/12 1046 07/12/12 1725  CKTOTAL 21  --   --   --   --   TROPONINI <0.30 <0.30 <0.30 <0.30 <0.30   BNP (last 3 results) No results found for this basename: PROBNP,  in the last 8760 hours CBG:  Recent Labs Lab 07/16/12 1128 07/16/12 1724 07/16/12 2320 07/17/12 0822 07/17/12 1216  GLUCAP 155* 154* 147* 129* 193*    Recent Results (from the past 240 hour(s))  CULTURE, BLOOD (ROUTINE X  2)     Status: None   Collection Time    07/12/12  5:10 AM      Result Value Range Status   Specimen Description BLOOD LEFT HAND   Final   Special Requests BOTTLES DRAWN AEROBIC AND ANAEROBIC 5CC   Final   Culture  Setup Time 07/12/2012 10:05   Final   Culture     Final   Value:        BLOOD CULTURE RECEIVED NO GROWTH TO DATE CULTURE WILL BE HELD FOR 5 DAYS BEFORE ISSUING A FINAL NEGATIVE REPORT   Report Status PENDING   Incomplete  CULTURE, BLOOD (ROUTINE X 2)     Status: None   Collection Time    07/12/12  5:15 AM      Result Value Range Status   Specimen Description  BLOOD RIGHT ANTECUBITAL   Final   Special Requests BOTTLES DRAWN AEROBIC ONLY 2CC   Final   Culture  Setup Time 07/12/2012 10:07   Final   Culture     Final   Value:        BLOOD CULTURE RECEIVED NO GROWTH TO DATE CULTURE WILL BE HELD FOR 5 DAYS BEFORE ISSUING A FINAL NEGATIVE REPORT   Report Status PENDING   Incomplete  CLOSTRIDIUM DIFFICILE BY PCR     Status: None   Collection Time    07/15/12  4:39 PM      Result Value Range Status   C difficile by pcr NEGATIVE  NEGATIVE Final     Studies: No results found.  Scheduled Meds: . albuterol  2.5 mg Nebulization BID  . ceFEPime (MAXIPIME) IV  1 g Intravenous Q12H  . diphenoxylate-atropine  1 tablet Oral QID  . docusate sodium  100 mg Oral BID  . enoxaparin (LOVENOX) injection  40 mg Subcutaneous Q24H  . feeding supplement  237 mL Oral QID  . fluconazole  100 mg Oral Daily  . guaiFENesin  600 mg Oral BID  . insulin aspart  0-9 Units Subcutaneous TID AC & HS  . latanoprost  1 drop Both Eyes QHS  . nystatin  5 mL Mouth/Throat QID  . pantoprazole  40 mg Oral Daily  . simvastatin  20 mg Oral q1800  . sodium chloride  3 mL Intravenous Q12H  . tiotropium  18 mcg Inhalation Daily  . vancomycin  1,000 mg Intravenous Q12H   Continuous Infusions: . sodium chloride 1,000 mL (07/17/12 0251)    Active Problems:   Lung cancer   Hypotension   Hyponatremia    Abnormal ECG   Dehydration   Failure to thrive   Diabetes   Debility    Time spent:      Kela Millin  [facility] Pager [phone]. If 7PM-7AM, please contact night-coverage at [email or web address], password [facility] 07/17/2012, 3:22 PM  LOS: 6 days

## 2012-07-18 DIAGNOSIS — I5032 Chronic diastolic (congestive) heart failure: Secondary | ICD-10-CM | POA: Diagnosis not present

## 2012-07-18 DIAGNOSIS — I5031 Acute diastolic (congestive) heart failure: Principal | ICD-10-CM

## 2012-07-18 LAB — TYPE AND SCREEN
Antibody Screen: NEGATIVE
Unit division: 0

## 2012-07-18 LAB — CBC
MCHC: 31.8 g/dL (ref 30.0–36.0)
RDW: 17.1 % — ABNORMAL HIGH (ref 11.5–15.5)

## 2012-07-18 LAB — CULTURE, BLOOD (ROUTINE X 2): Culture: NO GROWTH

## 2012-07-18 LAB — GLUCOSE, CAPILLARY
Glucose-Capillary: 111 mg/dL — ABNORMAL HIGH (ref 70–99)
Glucose-Capillary: 115 mg/dL — ABNORMAL HIGH (ref 70–99)

## 2012-07-18 MED ORDER — FUROSEMIDE 10 MG/ML IJ SOLN
20.0000 mg | Freq: Two times a day (BID) | INTRAMUSCULAR | Status: DC
  Administered 2012-07-18 – 2012-07-19 (×2): 20 mg via INTRAVENOUS
  Filled 2012-07-18 (×5): qty 2

## 2012-07-18 MED ORDER — ASPIRIN 81 MG PO CHEW
81.0000 mg | CHEWABLE_TABLET | Freq: Every day | ORAL | Status: DC
  Administered 2012-07-18 – 2012-07-22 (×5): 81 mg via ORAL
  Filled 2012-07-18 (×6): qty 1

## 2012-07-18 MED ORDER — DIPHENOXYLATE-ATROPINE 2.5-0.025 MG PO TABS: 1.0000 | ORAL_TABLET | Freq: Four times a day (QID) | ORAL | Status: DC | PRN

## 2012-07-18 MED ORDER — LEVOFLOXACIN 750 MG PO TABS
750.0000 mg | ORAL_TABLET | Freq: Every day | ORAL | Status: AC
  Administered 2012-07-18 – 2012-07-21 (×4): 750 mg via ORAL
  Filled 2012-07-18 (×7): qty 1

## 2012-07-18 NOTE — Progress Notes (Signed)
[facility] PROGRESS NOTE  [name] ZOX:[id number] DOB: 1936/01/30 DOA: 07/11/2012 PCP: [name], MD  Interim Summary: Pt is a 77yo with past medical history of Lung cancer (2012); Diabetes mellitus; HTN (hypertension); High cholesterol; radiation therapy (09/22/10 - 11/05/10); History of chemotherapy; and History of radiation therapy (04/24/12-05/15/12) currently undergoing chemotherapy for Stage IIIB/IV non-small cell lung cancer and presented with increasing nausea or vomiting and worsening generalized weakness. On admission it was noted that patient was hypotensive in the ED but this responded to IV fluids. Urinalysis in the ED was negative for infection, and chest x-ray showed mass effect and consolidation at the left lung base similar to prior x-rays but possible post obstructive pneumonia associated with left lower lobe mass not ruled out. Following admission he developed fever to 101.8, and given the above x-ray findings was empirically started on antibiotics for possible postobstructive PNA versus HCAP.  Pt had lat ST depression on EKG with no CP, CEs were cycled and came back neg>>echo with no wall motion abnormality.      Assessment/Plan: Present on Admission:  . Acute diastolic heart failure: Echocardiogram done on 5/1 noted grade 1 diastolic dysfunction. Because of continued IV antibiotics and hypotension, patient has received a large amount of IV fluids. He is positive for 20 L. Have started low dose Lasix and stopped his IV fluids. Low dose because of his hypotension.  Probable postobstructive PNA versus HCAP -pt was placed empiric abx with with vanc and cefepime following admission and also received dose of lasix and decrease IVF -he defervesed, cultures so far NGTD -will narrow abx, change to levaquin PO for days of abx -if remains afebrile on PO abx plan d/c soon  .Recent Thrush  - continue nystatin, received Diflucan and stopped  yesterday .Fevers -secondary to above, resolved - Continue abx for 3 more days as above -no further fevers since pm of 5/2, follow  . Hypotension - on admission thought to be 2/2 hypovolemia, improved with IVF.  infection may be factor here as well given fevers. bp stable so far today and no further fevers  . Lung cancer - as per Dr. Arbutus Ped   . 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   . Abnormal ECG  -troponins are negative, follow up EKG  On 5/1 still with ST depression -continue monitoring on telemetry. Most likely EKG changes are due to demand ischemia due to hypotension on admission -echo with no wall motion abnormalities, EF 50-55%  . Dehydration - IV fluids as above, resolved  . Failure to thrive/Protein Calorie malnutrition - likely 2/2 above, treat as above -appreciate nutrition recommendations, ensure complete supplements  . Diabetes - continue covering with sliding scale insulin,  holding his po medications pending improvement of po intake  . Debility - PT/OT recommending HH services  .Hypokalemia -likely secondary to GI losses, resolved  .Diarrhea -C.diff neg, continue antidiarrheal as needed only     Code Status: full Family Communication: wife and daughter at bedside Disposition Plan: pending clinical course   Consultants:  onc  Procedures: Echocardiogram done 5/1:Doppler parameters are consistent with abnormal left ventricular relaxation (grade 1 diastolic dysfunction).     Antibiotics:  Vanc and cefepime - started on 5/1and stopped on 5/6   diflucan 5/3-5/7   Levaquin started on 5/7-5/10   HPI/Subjective:  Still somewhat short of breath with exertion. Fatigued. No pain.   Objective: Filed Vitals:   07/18/12 1421 07/18/12 1508 07/18/12 1509  07/18/12 1639  BP: 93/46 103/56  96/55  Pulse: 91     Temp: 98 F (36.7 C)     TempSrc: Oral     Resp: 18     Height:      Weight:       SpO2: 98% 88% 98%     Intake/Output Summary (Last 24 hours) at 07/18/12 1640 Last data filed at 07/18/12 1422  Gross per 24 hour  Intake 3602.5 ml  Output   1200 ml  Net 2402.5 ml   Filed Weights   07/12/12 0335  Weight: 81.8 kg (180 lb 5.4 oz)    Exam:   General:  Frail elderly male in NAD, alert and appropriate  Cardiovascular:  Regular rate and rhythm, S1-S2   Respiratory:  Scattered rhonchi   Abdomen: soft, +BS NT/ND  Extremities: no cyanosis, trace pitting edema  Data Reviewed: Basic Metabolic Panel:  Recent Labs Lab 07/12/12 0520 07/13/12 0325 07/14/12 0445 07/15/12 0350 07/16/12 0345 07/17/12 0435  NA 135 132* 130* 131* 133* 133*  K 3.6 3.2* 3.0* 4.0 3.3* 3.8  CL 102 100 99 98 100 100  CO2 23 24 24 23 26 26   GLUCOSE 137* 178* 126* 188* 137* 151*  BUN 24* 16 11 10 9 6   CREATININE 1.07 0.94 0.87 0.82 0.84 0.80  CALCIUM 7.9* 7.5* 7.3* 7.5* 7.4* 7.5*  MG 1.7  --   --   --   --   --   PHOS 2.1*  --   --   --   --   --    Liver Function Tests:  Recent Labs Lab 07/11/12 1729 07/12/12 0520  AST 24 12  ALT 18 13  ALKPHOS 136* 77  BILITOT 0.7 0.6  PROT 6.6 5.4*  ALBUMIN 2.7* 2.2*   CBC:  Recent Labs Lab 07/11/12 1729 07/12/12 0520 07/16/12 0345 07/18/12 0545  WBC 12.6* 9.5 5.5 5.7  NEUTROABS 11.5*  --   --   --   HGB 9.5* 8.2* 6.4* 7.5*  HCT 27.9* 24.8* 19.3* 23.6*  MCV 85.6 87.0 86.2 87.4  PLT 135* 107* 102* 104*   Cardiac Enzymes:  Recent Labs Lab 07/11/12 1815 07/11/12 2337 07/12/12 0520 07/12/12 1046 07/12/12 1725  CKTOTAL 21  --   --   --   --   TROPONINI <0.30 <0.30 <0.30 <0.30 <0.30   BNP (last 3 results)  Recent Labs  07/18/12 1535  PROBNP 2615.0*   CBG:  Recent Labs Lab 07/17/12 1751 07/17/12 2127 07/18/12 0728 07/18/12 1147 07/18/12 1628  GLUCAP 161* 132* 111* 203* 120*    Recent Results (from the past 240 hour(s))  CULTURE, BLOOD (ROUTINE X 2)     Status: None   Collection Time    07/12/12   5:10 AM      Result Value Range Status   Specimen Description BLOOD LEFT HAND   Final   Special Requests BOTTLES DRAWN AEROBIC AND ANAEROBIC 5CC   Final   Culture  Setup Time 07/12/2012 10:05   Final   Culture NO GROWTH 5 DAYS   Final   Report Status 07/18/2012 FINAL   Final  CULTURE, BLOOD (ROUTINE X 2)     Status: None   Collection Time    07/12/12  5:15 AM      Result Value Range Status   Specimen Description BLOOD RIGHT ANTECUBITAL   Final   Special Requests BOTTLES DRAWN AEROBIC ONLY 2CC   Final   Culture  Setup Time  07/12/2012 10:07   Final   Culture NO GROWTH 5 DAYS   Final   Report Status 07/18/2012 FINAL   Final  CLOSTRIDIUM DIFFICILE BY PCR     Status: None   Collection Time    07/15/12  4:39 PM      Result Value Range Status   C difficile by pcr NEGATIVE  NEGATIVE Final     Studies: No results found.  Scheduled Meds: . albuterol  2.5 mg Nebulization BID  . aspirin  81 mg Oral Daily  . enoxaparin (LOVENOX) injection  40 mg Subcutaneous Q24H  . feeding supplement  237 mL Oral QID  . furosemide  20 mg Intravenous Q12H  . guaiFENesin  600 mg Oral BID  . insulin aspart  0-9 Units Subcutaneous TID AC & HS  . latanoprost  1 drop Both Eyes QHS  . levofloxacin  750 mg Oral Daily  . nystatin  5 mL Mouth/Throat QID  . pantoprazole  40 mg Oral Daily  . simvastatin  20 mg Oral q1800  . sodium chloride  3 mL Intravenous Q12H  . tiotropium  18 mcg Inhalation Daily   Continuous Infusions:    Principal Problem:   Acute diastolic heart failure Active Problems:   Lung cancer   Hypotension   Hyponatremia   Abnormal ECG   Dehydration   Failure to thrive   Diabetes   Debility    Time spent:  30 minutes       [name] K  [facility] Pager [phone] If 7PM-7AM, please contact night-coverage at [email or web address], password [facility] 07/18/2012, 4:40 PM  LOS: 7 days

## 2012-07-18 NOTE — Progress Notes (Signed)
Physical Therapy Treatment Patient Details Name: [name] MRN: [id number] DOB: 04/14/1935 Today's Date: 07/18/2012 Time: 4098-1191 PT Time Calculation (min): 31 min  PT Assessment / Plan / Recommendation Comments on Treatment Session  Pt in bed on 4 lts with Os sats at 89%.  Hgb 7.5 today. Assisted pt OOB to amb limited distance 2nd low HgB and monitoring O2 sats. Lowest stas on 4 lts 74%.Amb twice with one sitting rest breaks. Family still plans to D/C pt to home.    Follow Up Recommendations  Home health PT;Supervision/Assistance - 24 hour     Does the patient have the potential to tolerate intense rehabilitation     Barriers to Discharge        Equipment Recommendations  Rolling walker with 5" wheels    Recommendations for Other Services    Frequency Min 3X/week   Plan Discharge plan remains appropriate;Frequency remains appropriate    Precautions / Restrictions   monitor O2 sats  Pertinent Vitals/Pain No c/o pain    Mobility  Bed Mobility Bed Mobility: Supine to Sit Supine to Sit: 5: Supervision Details for Bed Mobility Assistance: Pt able to get to EOB @ Supervision level Transfers Transfers: Sit to Stand;Stand to Sit Sit to Stand: 4: Min guard;From bed;From elevated surface Stand to Sit: 4: Min guard;4: Min assist;To chair/3-in-1 Details for Transfer Assistance: 25% VC's for hand placement esp for stand to sit Ambulation/Gait Ambulation/Gait Assistance: 4: Min guard Ambulation Distance (Feet): 75 Feet (45' then 56' with one sitting rest break) Assistive device: Rolling walker Ambulation/Gait Assistance Details: Amb on 4 lts O2 sats lowest 74% so stop activity to rest and purse lip breath.  Pt has to be instructed on limited activity as he pushes self too far.  Spouse and daughter assisted. Gait Pattern: Step-to pattern;Trunk flexed;Step-through pattern Gait velocity: decreased     PT Goals                                                 progressing     Visit Information  Last PT Received On: 07/18/12 Assistance Needed: +2    Subjective Data      Cognition    good   Balance   fair with RW  End of Session PT - End of Session Equipment Utilized During Treatment: Gait belt;Oxygen Activity Tolerance: Patient limited by fatigue Patient left: in chair;with call bell/phone within reach;with family/visitor present   Felecia Shelling  PTA [facility]  Acute  Rehab Pager      [phone]

## 2012-07-18 NOTE — Progress Notes (Signed)
Occupational Therapy Treatment Patient Details Name: [name] MRN: [id number] DOB: 1936/02/23 Today's Date: 07/18/2012 Time: 4403-4742 OT Time Calculation (min): 18 min  OT Assessment / Plan / Recommendation Comments on Treatment Session  Pt very fatigued this afternoon.  Focused on energy conservation techniques and breathing exercises, but pt fatigues quickly with these.  Family continues to be supportive.     Follow Up Recommendations  Home health OT;Supervision/Assistance - 24 hour    Barriers to Discharge       Equipment Recommendations  Tub/shower bench    Recommendations for Other Services    Frequency Min 2X/week   Plan Discharge plan remains appropriate    Precautions / Restrictions Precautions Precautions: Fall Restrictions Weight Bearing Restrictions: No   Pertinent Vitals/Pain     ADL  ADL Comments: Pt sitting up in recliner with wife present.  He reports fatigue from PT.  Dyspnea 2-3/4 at rest on 4L.  Discussed energy conservation techniques with him, and worked on breathing exercises.  When pt focuses on his breathing, respirations do become deeper and more controlled, but pt with significant difficulty coordinating breathing and performing pursed lip breathing.  When he starts to think about the process, he requires max cues to breath correctly.    Pt unable to participate in further physical activity this pm due to fatigue.  Encouraged wife to continue to coach pt on breathing techniques and she and pt agreed.     OT Diagnosis:    OT Problem List:   OT Treatment Interventions:     OT Goals Acute Rehab OT Goals OT Goal Formulation: With patient/family Time For Goal Achievement: 07/27/12 Potential to Achieve Goals: Good ADL Goals Additional ADL Goal #2: Pt and family will be independent with energy conservation techniques ADL Goal: Additional Goal #2 - Progress: Goal set today  Visit Information  Last OT Received On: 07/18/12 Assistance Needed: +2     Subjective Data      Prior Functioning       Cognition  Cognition Arousal/Alertness: Awake/alert Behavior During Therapy: WFL for tasks assessed/performed Overall Cognitive Status: Within Functional Limits for tasks assessed    Mobility  Bed Mobility Bed Mobility: Not assessed Supine to Sit: 5: Supervision Details for Bed Mobility Assistance: Pt able to get to EOB @ Supervision level Transfers Sit to Stand: 4: Min guard;From bed;From elevated surface Stand to Sit: 4: Min guard;4: Min assist;To chair/3-in-1 Details for Transfer Assistance: 25% VC's for hand placement esp for stand to sit    Exercises      Balance     End of Session OT - End of Session Activity Tolerance: Patient limited by fatigue Patient left: in chair;with call bell/phone within reach;with family/visitor present  GO     Finleigh Cheong, Ursula Alert M 07/18/2012, 1:24 PM

## 2012-07-19 DIAGNOSIS — J441 Chronic obstructive pulmonary disease with (acute) exacerbation: Secondary | ICD-10-CM

## 2012-07-19 LAB — GLUCOSE, CAPILLARY
Glucose-Capillary: 115 mg/dL — ABNORMAL HIGH (ref 70–99)
Glucose-Capillary: 166 mg/dL — ABNORMAL HIGH (ref 70–99)
Glucose-Capillary: 105 mg/dL — ABNORMAL HIGH (ref 70–99)
Glucose-Capillary: 117 mg/dL — ABNORMAL HIGH (ref 70–99)

## 2012-07-19 LAB — CREATININE, SERUM
GFR calc Af Amer: >90 mL/min (ref >90)
GFR calc non Af Amer: 84 mL/min — ABNORMAL LOW (ref >90)

## 2012-07-19 MED ORDER — BOOST PLUS PO LIQD
237.0000 mL | Freq: Two times a day (BID) | ORAL | Status: DC
  Administered 2012-07-20 – 2012-07-22 (×3): 237 mL via ORAL
  Filled 2012-07-19 (×6): qty 237

## 2012-07-19 MED ORDER — ENSURE PUDDING PO PUDG
1.0000 | Freq: Two times a day (BID) | ORAL | Status: DC
  Administered 2012-07-20 – 2012-07-22 (×2): 1 via ORAL
  Filled 2012-07-19 (×7): qty 1

## 2012-07-19 MED ORDER — FUROSEMIDE 10 MG/ML IJ SOLN: 40.0000 mg | Freq: Two times a day (BID) | INTRAMUSCULAR | Status: DC

## 2012-07-19 MED ORDER — LIVING BETTER WITH HEART FAILURE BOOK
Freq: Once | Status: AC
  Administered 2012-07-19: 16:00:00
  Filled 2012-07-19: qty 1

## 2012-07-19 MED ORDER — FUROSEMIDE 10 MG/ML IJ SOLN
40.0000 mg | Freq: Three times a day (TID) | INTRAMUSCULAR | Status: DC
  Administered 2012-07-19 – 2012-07-21 (×6): 40 mg via INTRAVENOUS
  Filled 2012-07-19 (×8): qty 4

## 2012-07-19 NOTE — Clinical Documentation Improvement (Signed)
GENERIC DOCUMENTATION CLARIFICATION QUERY  THIS DOCUMENT IS NOT A PERMANENT PART OF THE MEDICAL RECORD  TO RESPOND TO THE THIS QUERY, FOLLOW THE INSTRUCTIONS BELOW:  1. If needed, update documentation for the patient's encounter via the notes activity.  2. Access this query again and click edit on the In [employer].  3. After updating, or not, click F2 to complete all highlighted (required) fields concerning your review. Select "additional documentation in the medical record" OR "no additional documentation provided".  4. Click Sign note button.  5. The deficiency will fall out of your In Basket *Please let us know if you are not able to complete this workflow by phone or e-mail (listed below).  Please update your documentation within the medical record to reflect your response to this query.                                                                                        07/19/12   Dear Dr.Daryan Buell[facility],  In a better effort to capture your patient's severity of illness, reflect appropriate length of stay and utilization of resources, a review of the patient medical record has revealed the following indicators.    Based on your clinical judgment, please clarify and document in a progress note and/or discharge summary the clinical condition associated with the following supporting information:  In responding to this query please exercise your independent judgment.  The fact that a query is asked, does not imply that any particular answer is desired or expected.   Pt with hypotension 2/2 to hypovolemia  Clarification Needed  Please clarify if pt hypovolemia in setting of hypotension, tachycardia, and dehydration necessitating the treatment of IV fluids can be further specified as one of the diagnoses listed below and document in pn or d/c summary.   Possible Clinical Conditions?  Shock  Hypovolemic Shock ____________________ _______Other  Condition__________________ _______Cannot Clinically Determine   Supporting Information:  Risk Factors:  Signs & Symptoms: Fatigue, weakness, severe n/v per [facility]  ED note 07/12/12  Conjunctiva appear pale.   Vital signs are Significant for tachycardia with heart rate 106, and hypertension with blood pressure 87/61, and tachypnea with respiratory rate of 26.   However, given hemodynamic instability, it was decided to admit the patient.   Rhythm: sinus tachycardia  H/P  Hypotension - most likely secondary to dehydration seems to improve with IV fluids.   Hyponatremia - this is chronic likely contamination of SIADH and dehydration      Diagnostics: Component      Sodium  Latest Ref Rng      135 - 145 mEq/L  07/11/2012     5:29 PM 132 (L)   Component      Sodium  Latest Ref Rng      135 - 145 mEq/L  07/14/2012     4:45 AM 130 (L)   Treatment Monitoring  You may use possible, probable, or suspect with inpatient documentation. possible, probable, suspected diagnoses MUST be documented at the time of discharge  Reviewed: additional documentation in the medical record-not shock, this was in the setting of poor po intake.  Thank You,  Enis Slipper  RN, BSN, MSN/Inf, CCDS Clinical Documentation Specialist [facility] Dept Pager: [phone] / E-mail: [email or web address]  Health Information Management [place]

## 2012-07-19 NOTE — Progress Notes (Signed)
Pt on 4L of O2 at rest 95%.  Pt on RA  At rest 85%.  Pt placed back on 4L of O2. O2 rose back to 95%.

## 2012-07-19 NOTE — Progress Notes (Signed)
Physical Therapy Treatment Patient Details Name: [name] MRN: [id number] DOB: 1935/06/07 Today's Date: 07/19/2012 Time: 4098-1191 PT Time Calculation (min): 15 min  PT Assessment / Plan / Recommendation Comments on Treatment Session  Pt OOB in recliner (nice) with spouse in room. Noted pt with mild dyspnea just sitting in the recliner. Pt on 4 tls @ 94% at rest.  Removed O2 and sats dropped to 82% @ rest.  Pt using alot of accesory muscles to breath.  Reapplied 4 lts and assisted pt with standing.  Pt c/o MAX feeling "light headed" BP 103/58.  Sat pt back in recliner.   Unable to attempt amb due to pt's c/o and BP.    Follow Up Recommendations  Home health PT;Supervision/Assistance - 24 hour     Does the patient have the potential to tolerate intense rehabilitation     Barriers to Discharge        Equipment Recommendations  Rolling walker with 5" wheels    Recommendations for Other Services    Frequency Min 3X/week   Plan Discharge plan remains appropriate;Frequency remains appropriate    Precautions / Restrictions Precautions Precautions: Fall Precaution Comments: monitor O2/BP Restrictions Weight Bearing Restrictions: No   Pertinent Vitals/Pain No c/o pain    Mobility  Bed Mobility Bed Mobility: Not assessed Details for Bed Mobility Assistance: Pt OOB in recliner Transfers Transfers: Sit to Stand;Stand to Sit Sit to Stand: 4: Min guard;From chair/3-in-1 Stand to Sit: 4: Min guard;To chair/3-in-1 Details for Transfer Assistance: 25% VC's for hand placement esp for stand to sit Ambulation/Gait Ambulation/Gait Assistance Details: unable to attempt 2nd MAX c/o feeling "light headed" and B knees trembling     PT Goals                                      progressing    Visit Information  Last PT Received On: 07/19/12 Assistance Needed: +2    Subjective Data  Subjective: I feel light headed Patient Stated Goal: sit back down   Cognition    good    Balance   fair  End of Session PT - End of Session Equipment Utilized During Treatment: Gait belt;Oxygen Activity Tolerance: Patient limited by fatigue Patient left: in chair;with call bell/phone within reach;with family/visitor present   Felecia Shelling  PTA [facility]  Acute  Rehab Pager      [phone]

## 2012-07-19 NOTE — Progress Notes (Signed)
[facility] PROGRESS NOTE  [name] JXB:[id number] DOB: 08/09/1935 DOA: 07/11/2012 PCP: [name], MD  Interim Summary: Pt is a 77yo with past medical history of Lung cancer (2012); Diabetes mellitus; HTN (hypertension); High cholesterol; radiation therapy (09/22/10 - 11/05/10); History of chemotherapy; and History of radiation therapy (04/24/12-05/15/12) currently undergoing chemotherapy for Stage IIIB/IV non-small cell lung cancer and presented with increasing nausea or vomiting and worsening generalized weakness. On admission it was noted that patient was hypotensive in the ED but this responded to IV fluids. Urinalysis in the ED was negative for infection, and chest x-ray showed mass effect and consolidation at the left lung base similar to prior x-rays but possible post obstructive pneumonia associated with left lower lobe mass not ruled out. Following admission he developed fever to 101.8, and given the above x-ray findings was empirically started on antibiotics for possible postobstructive PNA versus HCAP.  Pt had lat ST depression on EKG with no CP, CEs were cycled and came back neg>>echo with no wall motion abnormality.      Assessment/Plan: Present on Admission:  . Acute diastolic heart failure: Echocardiogram done on 5/1 noted grade 1 diastolic dysfunction. Because of continued IV antibiotics and hypotension, patient has received a large amount of IV fluids. He is positive for 20 L. started Lasix 20 IV twice a day yesterday and his respond well and Ardee diuresed 2 L. We'll increase to 40 3 times a day  Probable postobstructive PNA versus HCAP -pt was placed empiric abx with with vanc and cefepime following admission and also received dose of lasix and decrease IVF -he defervesed, cultures so far NGTD -will narrow abx, change to levaquin PO for days of abx -if remains afebrile on PO abx plan d/c soon  .Recent Thrush  - continue nystatin, received Diflucan and  stopped yesterday .Fevers -secondary to above, resolved - Continue abx for 3 more days as above -no further fevers since pm of 5/2, follow  . Hypotension - on admission thought to be 2/2 hypovolemia, improved with IVF.  infection may be factor here as well given fevers. bp stable so far today and no further fevers  . Lung cancer - as per Dr. Arbutus Ped   . 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   . Abnormal ECG  -troponins are negative, follow up EKG  On 5/1 still with ST depression -continue monitoring on telemetry. Most likely EKG changes are due to demand ischemia due to hypotension on admission -echo with no wall motion abnormalities, EF 50-55%  . Dehydration - IV fluids as above, resolved  . Failure to thrive/Protein Calorie malnutrition - likely 2/2 above, treat as above -appreciate nutrition recommendations, ensure complete supplements  . Diabetes - continue covering with sliding scale insulin,  holding his po medications pending improvement of po intake  . Debility - PT/OT recommending HH services  .Hypokalemia -likely secondary to GI losses, resolved  .Diarrhea -C.diff neg, continue antidiarrheal as needed only     Code Status: full Family Communication: Spoke with patient and his wife at the bedside Disposition Plan: Home once better diuresed   Consultants:  onc  Procedures: Echocardiogram done 5/1:Doppler parameters are consistent with abnormal left ventricular relaxation (grade 1 diastolic dysfunction).     Antibiotics:  Vanc and cefepime - started on 5/1and stopped on 5/6   diflucan 5/3-5/7   Levaquin started on 5/7-5/10   HPI/Subjective: Patient is very little appetite. Responded somewhat to Lasix. Breathing  about the same.  Objective: Filed Vitals:   07/18/12 2300 07/19/12 0505 07/19/12 0931 07/19/12 1434  BP:  101/54  101/60  Pulse:  89  86  Temp:  97.8 F (36.6 C)  97.2 F  (36.2 C)  TempSrc:  Oral  Oral  Resp:  22  20  Height:      Weight:      SpO2: 94% 92% 96% 98%    Intake/Output Summary (Last 24 hours) at 07/19/12 1524 Last data filed at 07/19/12 1435  Gross per 24 hour  Intake    880 ml  Output   3050 ml  Net  -2170 ml   Filed Weights   07/12/12 0335  Weight: 81.8 kg (180 lb 5.4 oz)    Exam:   General:  Frail elderly male in NAD, alert and appropriate  Cardiovascular:  Regular rate and rhythm, S1-S2   Respiratory:  Scattered rhonchi   Abdomen: soft, +BS NT/ND  Extremities: no cyanosis, trace pitting edema  Data Reviewed: Basic Metabolic Panel:  Recent Labs Lab 07/13/12 0325 07/14/12 0445 07/15/12 0350 07/16/12 0345 07/17/12 0435 07/19/12 0342  NA 132* 130* 131* 133* 133*  --   K 3.2* 3.0* 4.0 3.3* 3.8  --   CL 100 99 98 100 100  --   CO2 24 24 23 26 26   --   GLUCOSE 178* 126* 188* 137* 151*  --   BUN 16 11 10 9 6   --   CREATININE 0.94 0.87 0.82 0.84 0.80 0.84  CALCIUM 7.5* 7.3* 7.5* 7.4* 7.5*  --    I do not think he CBC:  Recent Labs Lab 07/16/12 0345 07/18/12 0545  WBC 5.5 5.7  HGB 6.4* 7.5*  HCT 19.3* 23.6*  MCV 86.2 87.4  PLT 102* 104*   Cardiac Enzymes:  Recent Labs Lab 07/12/12 1725  TROPONINI <0.30   BNP (last 3 results)  Recent Labs  07/18/12 1535  PROBNP 2615.0*   CBG:  Recent Labs Lab 07/18/12 1147 07/18/12 1628 07/18/12 2050 07/19/12 0710 07/19/12 1139  GLUCAP 203* 120* 115* 115* 166*    Recent Results (from the past 240 hour(s))  CULTURE, BLOOD (ROUTINE X 2)     Status: None   Collection Time    07/12/12  5:10 AM      Result Value Range Status   Specimen Description BLOOD LEFT HAND   Final   Special Requests BOTTLES DRAWN AEROBIC AND ANAEROBIC 5CC   Final   Culture  Setup Time 07/12/2012 10:05   Final   Culture NO GROWTH 5 DAYS   Final   Report Status 07/18/2012 FINAL   Final  CULTURE, BLOOD (ROUTINE X 2)     Status: None   Collection Time    07/12/12  5:15 AM       Result Value Range Status   Specimen Description BLOOD RIGHT ANTECUBITAL   Final   Special Requests BOTTLES DRAWN AEROBIC ONLY 2CC   Final   Culture  Setup Time 07/12/2012 10:07   Final   Culture NO GROWTH 5 DAYS   Final   Report Status 07/18/2012 FINAL   Final  CLOSTRIDIUM DIFFICILE BY PCR     Status: None   Collection Time    07/15/12  4:39 PM      Result Value Range Status   C difficile by pcr NEGATIVE  NEGATIVE Final     Studies: No results found.  Scheduled Meds: . albuterol  2.5 mg Nebulization BID  .  aspirin  81 mg Oral Daily  . enoxaparin (LOVENOX) injection  40 mg Subcutaneous Q24H  . feeding supplement  237 mL Oral QID  . furosemide  40 mg Intravenous Q8H  . guaiFENesin  600 mg Oral BID  . insulin aspart  0-9 Units Subcutaneous TID AC & HS  . latanoprost  1 drop Both Eyes QHS  . levofloxacin  750 mg Oral Daily  . Living Better with Heart Failure Book   Does not apply Once  . nystatin  5 mL Mouth/Throat QID  . pantoprazole  40 mg Oral Daily  . simvastatin  20 mg Oral q1800  . sodium chloride  3 mL Intravenous Q12H  . tiotropium  18 mcg Inhalation Daily   Continuous Infusions:    Principal Problem:   Acute diastolic heart failure Active Problems:   Lung cancer   Hypotension   Hyponatremia   Abnormal ECG   Dehydration   Failure to thrive   Diabetes   Debility    Time spent:  30 minutes     KRISHNAN,SENDIL K  [facility] Pager [phone] If 7PM-7AM, please contact night-coverage at [email or web address], password [facility] 07/19/2012, 3:24 PM  LOS: 8 days

## 2012-07-19 NOTE — Progress Notes (Signed)
NUTRITION FOLLOW UP  Intervention:   Provide Boost Plus BID Provide Ensure pudding BID Discontinue Ensure Complete po QID, each supplement provides 350 kcal and 13 grams of protein.  Discontinue Magic Cup once daily  Encourage PO intake Recommend daily weights  Nutrition Dx:   Inadequate oral intake related to nausea/vomiting with chemotherapy as evidenced by pt's report of poor po intake 9% wt loss in the past month; ongoing- N/V improved but po intake continues to be poor   Goal:   Pt to meet >/= 90% of their estimated nutrition needs; not met  Monitor:   PO intake; poor Weight; no new wt since 5/1 N/V/D; improved Labs; low hemoglobin  Assessment:   Pt reports that he is eating very little due to getting full fast. Pt has been eating primarily sweet potato, chicken broth, and pancakes per pt's wife. Pt states he has only been drinking one Ensure supplement per day and would be more interested in Ensure Pudding and Boost supplements. Pt reports that he has some nausea this morning but, most days he has felt okay. Encouraged pt to continue ordering 3 meal daily with po intake as tolerated.  Height: Ht Readings from Last 1 Encounters:  07/12/12 5\' 10"  (1.778 m)    Weight Status:   Wt Readings from Last 1 Encounters:  07/12/12 180 lb 5.4 oz (81.8 kg)    Re-estimated needs:  Kcal: 2190-2500  Protein: 98-123 grams  Fluid: 2.4 L  Skin: dry, intact  Diet Order: General   Intake/Output Summary (Last 24 hours) at 07/19/12 1704 Last data filed at 07/19/12 1645  Gross per 24 hour  Intake    880 ml  Output   3050 ml  Net  -2170 ml    Last BM: 5/6   Labs:   Recent Labs Lab 07/15/12 0350 07/16/12 0345 07/17/12 0435 07/19/12 0342  NA 131* 133* 133*  --   K 4.0 3.3* 3.8  --   CL 98 100 100  --   CO2 23 26 26   --   BUN 10 9 6   --   CREATININE 0.82 0.84 0.80 0.84  CALCIUM 7.5* 7.4* 7.5*  --   GLUCOSE 188* 137* 151*  --     CBG (last 3)   Recent Labs  07/18/12 2050 07/19/12 0710 07/19/12 1139  GLUCAP 115* 115* 166*    Scheduled Meds: . albuterol  2.5 mg Nebulization BID  . aspirin  81 mg Oral Daily  . enoxaparin (LOVENOX) injection  40 mg Subcutaneous Q24H  . feeding supplement  237 mL Oral QID  . furosemide  40 mg Intravenous Q8H  . guaiFENesin  600 mg Oral BID  . insulin aspart  0-9 Units Subcutaneous TID AC & HS  . latanoprost  1 drop Both Eyes QHS  . levofloxacin  750 mg Oral Daily  . nystatin  5 mL Mouth/Throat QID  . pantoprazole  40 mg Oral Daily  . simvastatin  20 mg Oral q1800  . sodium chloride  3 mL Intravenous Q12H  . tiotropium  18 mcg Inhalation Daily    Ian Malkin RD, LDN Inpatient Clinical Dietitian Pager: [phone] After Hours Pager: [phone]

## 2012-07-19 NOTE — Progress Notes (Signed)
Received orders for oxygen and rw.  Order is currently on hold until RA Sats at Rest are documented.

## 2012-07-20 MED ORDER — ALPRAZOLAM 0.5 MG PO TABS
0.5000 mg | ORAL_TABLET | Freq: Three times a day (TID) | ORAL | Status: DC | PRN
  Administered 2012-07-20: 0.5 mg via ORAL
  Filled 2012-07-20: qty 1

## 2012-07-20 MED ORDER — ALBUTEROL SULFATE (5 MG/ML) 0.5% IN NEBU: 2.5000 mg | INHALATION_SOLUTION | RESPIRATORY_TRACT | Status: DC | PRN

## 2012-07-20 NOTE — Progress Notes (Signed)
PT Cancellation Note  ___Treatment cancelled today due to medical issues with patient which prohibited therapy  ___ Treatment cancelled today due to patient receiving procedure or test   ___ Treatment cancelled today due to patient's refusal to participate   _X_ Treatment cancelled today due to nausea in am then too fatigued in PM   Felecia Shelling  PTA [facility]  Acute  Rehab Pager      [phone]

## 2012-07-20 NOTE — Progress Notes (Signed)
[facility] PROGRESS NOTE  LAMOND GLANTZ ZOX:[id number] DOB: Feb 01, 1936 DOA: 07/11/2012 PCP: [name], MD  Interim Summary: Pt is a 77yo with past medical history of Lung cancer (2012); Diabetes mellitus; HTN (hypertension); High cholesterol; radiation therapy (09/22/10 - 11/05/10); History of chemotherapy; and History of radiation therapy (04/24/12-05/15/12) currently undergoing chemotherapy for Stage IIIB/IV non-small cell lung cancer and presented with increasing nausea or vomiting and worsening generalized weakness. On admission it was noted that patient was hypotensive in the ED but this responded to IV fluids. Urinalysis in the ED was negative for infection, and chest x-ray showed mass effect and consolidation at the left lung base similar to prior x-rays but possible post obstructive pneumonia associated with left lower lobe mass not ruled out. Following admission he developed fever to 101.8, and given the above x-ray findings was empirically started on antibiotics for possible postobstructive PNA versus HCAP.  Pt had lat ST depression on EKG with no CP, CEs were cycled and came back neg>>echo with no wall motion abnormality.      Assessment/Plan: Present on Admission:  . Acute diastolic heart failure: Echocardiogram done on 5/1 noted grade 1 diastolic dysfunction. Because of continued IV antibiotics and hypotension, patient has received a large amount of IV fluids. He is positive for 20 L. started Lasix 20 IV twice a day yesterday and his respond well and Ardee diuresed 2 L. he responded well to increasing Lasix to 40 mg 3 times a day and is now down to 14 L positive. Recheck lab work in the morning  Probable postobstructive PNA versus HCAP -pt was placed empiric abx with with vanc and cefepime following admission and also received dose of lasix and decrease IVF -he defervesed, cultures so far NGTD -will narrow abx, change to levaquin PO for 1 more days of abx -if remains  afebrile on PO abx plan d/c soon  .Recent Thrush  - continue nystatin, received Diflucan and stopped yesterday .Fevers -secondary to above, resolved - Continue abx until tomorrow -no further fevers since pm of 5/2, follow  . Hypotension - on admission thought to be 2/2 hypovolemia, improved with IVF.  infection may be factor here as well given fevers. bp stable so far today and no further fevers  . Lung cancer - as per Dr. Arbutus Ped   . 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 Recheck labs tomorrow  . Abnormal ECG  -troponins are negative, follow up EKG  On 5/1 still with ST depression -continue monitoring on telemetry. Most likely EKG changes are due to demand ischemia due to hypotension on admission -echo with no wall motion abnormalities, EF 50-55%  . Dehydration - IV fluids as above, resolved  . Failure to thrive/Protein Calorie malnutrition - likely 2/2 above, treat as above -appreciate nutrition recommendations, ensure complete supplements  . Diabetes - continue covering with sliding scale insulin,  holding his po medications pending improvement of po intake  . Debility - PT/OT recommending HH services  .Hypokalemia -likely secondary to GI losses, resolved  .Diarrhea -C.diff neg, continue antidiarrheal as needed only     Code Status: full Family Communication: Spoke with patient and his wife at the bedside Disposition Plan: Home once better diuresed   Consultants:  onc  Procedures: Echocardiogram done 5/1:Doppler parameters are consistent with abnormal left ventricular relaxation (grade 1 diastolic dysfunction).     Antibiotics:  Vanc and cefepime - started on 5/1and stopped on 5/6   diflucan 5/3-5/7  Levaquin started on 5/7-5/10   HPI/Subjective: Patient feeling better. Continues to diurese and states breathing is a little easier. Requesting medication for nighttime  anxiety.  Objective: Filed Vitals:   07/19/12 2123 07/20/12 0545 07/20/12 0754 07/20/12 1432  BP: 103/55 100/54  96/52  Pulse: 93 93  89  Temp: 98 F (36.7 C) 97.8 F (36.6 C)  97.8 F (36.6 C)  TempSrc: Oral Oral  Oral  Resp: 20 20  20   Height:      Weight:  80.7 kg (177 lb 14.6 oz)    SpO2: 92% 94% 91% 93%    Intake/Output Summary (Last 24 hours) at 07/20/12 1452 Last data filed at 07/20/12 1433  Gross per 24 hour  Intake    600 ml  Output   4450 ml  Net  -3850 ml   Filed Weights   07/12/12 0335 07/20/12 0545  Weight: 81.8 kg (180 lb 5.4 oz) 80.7 kg (177 lb 14.6 oz)    Exam:   General:  Frail elderly male in NAD, alert and appropriate  Cardiovascular:  Regular rate and rhythm, S1-S2   Respiratory:  Scattered rhonchi , better airway exchange today.  Abdomen: soft, +BS NT/ND  Extremities: no cyanosis, trace pitting edema  Data Reviewed: Basic Metabolic Panel:  Recent Labs Lab 07/14/12 0445 07/15/12 0350 07/16/12 0345 07/17/12 0435 07/19/12 0342  NA 130* 131* 133* 133*  --   K 3.0* 4.0 3.3* 3.8  --   CL 99 98 100 100  --   CO2 24 23 26 26   --   GLUCOSE 126* 188* 137* 151*  --   BUN 11 10 9 6   --   CREATININE 0.87 0.82 0.84 0.80 0.84  CALCIUM 7.3* 7.5* 7.4* 7.5*  --    I do not think he CBC:  Recent Labs Lab 07/16/12 0345 07/18/12 0545  WBC 5.5 5.7  HGB 6.4* 7.5*  HCT 19.3* 23.6*  MCV 86.2 87.4  PLT 102* 104*   BNP (last 3 results)  Recent Labs  07/18/12 1535  PROBNP 2615.0*   CBG:  Recent Labs Lab 07/19/12 1139 07/19/12 1705 07/19/12 2121 07/20/12 0729 07/20/12 1112  GLUCAP 166* 105* 117* 120* 163*    Recent Results (from the past 240 hour(s))  CULTURE, BLOOD (ROUTINE X 2)     Status: None   Collection Time    07/12/12  5:10 AM      Result Value Range Status   Specimen Description BLOOD LEFT HAND   Final   Special Requests BOTTLES DRAWN AEROBIC AND ANAEROBIC 5CC   Final   Culture  Setup Time 07/12/2012 10:05   Final    Culture NO GROWTH 5 DAYS   Final   Report Status 07/18/2012 FINAL   Final  CULTURE, BLOOD (ROUTINE X 2)     Status: None   Collection Time    07/12/12  5:15 AM      Result Value Range Status   Specimen Description BLOOD RIGHT ANTECUBITAL   Final   Special Requests BOTTLES DRAWN AEROBIC ONLY 2CC   Final   Culture  Setup Time 07/12/2012 10:07   Final   Culture NO GROWTH 5 DAYS   Final   Report Status 07/18/2012 FINAL   Final  CLOSTRIDIUM DIFFICILE BY PCR     Status: None   Collection Time    07/15/12  4:39 PM      Result Value Range Status   C difficile by pcr NEGATIVE  NEGATIVE Final  Studies: No results found.  Scheduled Meds: . albuterol  2.5 mg Nebulization BID  . aspirin  81 mg Oral Daily  . enoxaparin (LOVENOX) injection  40 mg Subcutaneous Q24H  . feeding supplement  1 Container Oral BID  . furosemide  40 mg Intravenous Q8H  . guaiFENesin  600 mg Oral BID  . insulin aspart  0-9 Units Subcutaneous TID AC & HS  . lactose free nutrition  237 mL Oral BID BM  . latanoprost  1 drop Both Eyes QHS  . levofloxacin  750 mg Oral Daily  . nystatin  5 mL Mouth/Throat QID  . pantoprazole  40 mg Oral Daily  . simvastatin  20 mg Oral q1800  . sodium chloride  3 mL Intravenous Q12H  . tiotropium  18 mcg Inhalation Daily   Continuous Infusions:    Principal Problem:   Acute diastolic heart failure Active Problems:   Lung cancer   Hypotension   Hyponatremia   Abnormal ECG   Dehydration   Failure to thrive   Diabetes   Debility    Time spent:  30 minutes     Dhiya Smits K  [facility] Pager [phone] If 7PM-7AM, please contact night-coverage at [email or web address], password [facility] 07/20/2012, 2:52 PM  LOS: 9 days

## 2012-07-21 LAB — BASIC METABOLIC PANEL
BUN: 15 mg/dL (ref 6–23)
CO2: 31 mEq/L (ref 19–32)
Calcium: 8.7 mg/dL (ref 8.4–10.5)
GFR calc non Af Amer: 53 mL/min — ABNORMAL LOW (ref >90)
Glucose, Bld: 110 mg/dL — ABNORMAL HIGH (ref 70–99)

## 2012-07-21 LAB — GLUCOSE, CAPILLARY
Glucose-Capillary: 112 mg/dL — ABNORMAL HIGH (ref 70–99)
Glucose-Capillary: 195 mg/dL — ABNORMAL HIGH (ref 70–99)
Glucose-Capillary: 168 mg/dL — ABNORMAL HIGH (ref 70–99)
Glucose-Capillary: 138 mg/dL — ABNORMAL HIGH (ref 70–99)

## 2012-07-21 MED ORDER — POTASSIUM CHLORIDE CRYS ER 20 MEQ PO TBCR
40.0000 meq | EXTENDED_RELEASE_TABLET | Freq: Once | ORAL | Status: AC
  Administered 2012-07-21: 40 meq via ORAL
  Filled 2012-07-21: qty 2

## 2012-07-21 NOTE — Progress Notes (Signed)
[facility] PROGRESS NOTE  CARLE FENECH ZOX:[id number] DOB: 01-Oct-1935 DOA: 07/11/2012 PCP: [name], MD  Interim Summary: Pt is a 77yo with past medical history of Lung cancer (2012); Diabetes mellitus; HTN (hypertension); High cholesterol; radiation therapy (09/22/10 - 11/05/10); History of chemotherapy; and History of radiation therapy (04/24/12-05/15/12) currently undergoing chemotherapy for Stage IIIB/IV non-small cell lung cancer and presented with increasing nausea or vomiting and worsening generalized weakness. On admission it was noted that patient was hypotensive in the ED but this responded to IV fluids. Urinalysis in the ED was negative for infection, and chest x-ray showed mass effect and consolidation at the left lung base similar to prior x-rays but possible post obstructive pneumonia associated with left lower lobe mass not ruled out. Following admission he developed fever to 101.8, and given the above x-ray findings was empirically started on antibiotics for possible postobstructive PNA versus HCAP.  Pt had lat ST depression on EKG with no CP, CEs were cycled and came back neg>>echo with no wall motion abnormality.      Assessment/Plan: Present on Admission:  . Acute diastolic heart failure: Echocardiogram done on 5/1 noted grade 1 diastolic dysfunction. Because of continued IV antibiotics and hypotension, patient has received a large amount of IV fluids. He is positive for 20 L. started Lasix 20 IV twice a day yesterday and his respond well and Ardee diuresed 2 L. he responded well to increasing Lasix to 40 mg 3 times a day and is now down to 13 L. Creatinine starting to slightly rise this morning so discontinued IV Lasix. He'll the patient is very close to dry weight. He is feeling markedly better. We'll work on weaning oxygen slightly, encouraging inspirometer use and plan to discharge tomorrow   Probable postobstructive PNA versus HCAP -pt was placed empiric abx  with with vanc and cefepime following admission and also received dose of lasix and decrease IVF -he defervesed, cultures so far NGTD Patient finishes antibiotics today  .Recent Thrush  - continue nystatin, received Diflucan and stopped yesterday .Fevers -secondary to above, resolved Antibiotics completed -no further fevers since pm of 5/2, follow  . Hypotension - on admission thought to be 2/2 hypovolemia, improved with IVF.  infection may be factor here as well given fevers. bp stable so far today and no further fevers  . Lung cancer - as per Dr. Arbutus Ped   . 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 Recheck labs tomorrow  . Abnormal ECG  -troponins are negative, follow up EKG  On 5/1 still with ST depression -continue monitoring on telemetry. Most likely EKG changes are due to demand ischemia due to hypotension on admission -echo with no wall motion abnormalities, EF 50-55%  . Dehydration - IV fluids as above, resolved  . Failure to thrive/Protein Calorie malnutrition - likely 2/2 above, treat as above -appreciate nutrition recommendations, ensure complete supplements  . Diabetes - continue covering with sliding scale insulin,  holding his po medications pending improvement of po intake  . Debility - PT/OT recommending HH services  .Hypokalemia -likely secondary to GI losses, resolved  .Diarrhea -C.diff neg, continue antidiarrheal as needed only     Code Status: full Family Communication: Spoke with patient and his wife at the bedside Disposition Plan: Home once better diuresed   Consultants:  onc  Procedures: Echocardiogram done 5/1:Doppler parameters are consistent with abnormal left ventricular relaxation (grade 1 diastolic dysfunction).     Antibiotics:  Vanc and cefepime -  started on 5/1and stopped on 5/6   diflucan 5/3-5/7   Levaquin started on 5/7-5/10   HPI/Subjective: Patient feeling  markedly better. Breathing easier.   Objective: Filed Vitals:   07/20/12 1432 07/20/12 2156 07/21/12 0624 07/21/12 0755  BP: 96/52 102/51 92/59   Pulse: 89 99 93   Temp: 97.8 F (36.6 C) 97.7 F (36.5 C) 98.4 F (36.9 C)   TempSrc: Oral Oral Oral   Resp: 20 18 18    Height:      Weight:   79.062 kg (174 lb 4.8 oz)   SpO2: 93% 90% 91% 52%    Intake/Output Summary (Last 24 hours) at 07/21/12 1358 Last data filed at 07/21/12 1505  Gross per 24 hour  Intake    580 ml  Output   1700 ml  Net  -1120 ml   Filed Weights   07/12/12 0335 07/20/12 0545 07/21/12 0624  Weight: 81.8 kg (180 lb 5.4 oz) 80.7 kg (177 lb 14.6 oz) 79.062 kg (174 lb 4.8 oz)    Exam:   General:  Frail elderly male in NAD, alert and appropriate  Cardiovascular:  Regular rate and rhythm, S1-S2   Respiratory:  Significantly improved exam, much less rhonchi  Abdomen: soft, +BS NT/ND  Extremities: no cyanosis, trace pitting edema  Data Reviewed: Basic Metabolic Panel:  Recent Labs Lab 07/15/12 0350 07/16/12 0345 07/17/12 0435 07/19/12 0342 07/21/12 0312  NA 131* 133* 133*  --  134*  K 4.0 3.3* 3.8  --  3.1*  CL 98 100 100  --  92*  CO2 23 26 26   --  31  GLUCOSE 188* 137* 151*  --  110*  BUN 10 9 6   --  15  CREATININE 0.82 0.84 0.80 0.84 1.28  CALCIUM 7.5* 7.4* 7.5*  --  8.7   I do not think he CBC:  Recent Labs Lab 07/16/12 0345 07/18/12 0545  WBC 5.5 5.7  HGB 6.4* 7.5*  HCT 19.3* 23.6*  MCV 86.2 87.4  PLT 102* 104*   BNP (last 3 results)  Recent Labs  07/18/12 1535 07/21/12 0311  PROBNP 2615.0* 1094.0*   CBG:  Recent Labs Lab 07/20/12 1112 07/20/12 1651 07/20/12 2150 07/21/12 0745 07/21/12 1209  GLUCAP 163* 116* 119* 112* 195*    Recent Results (from the past 240 hour(s))  CULTURE, BLOOD (ROUTINE X 2)     Status: None   Collection Time    07/12/12  5:10 AM      Result Value Range Status   Specimen Description BLOOD LEFT HAND   Final   Special Requests BOTTLES  DRAWN AEROBIC AND ANAEROBIC 5CC   Final   Culture  Setup Time 07/12/2012 10:05   Final   Culture NO GROWTH 5 DAYS   Final   Report Status 07/18/2012 FINAL   Final  CULTURE, BLOOD (ROUTINE X 2)     Status: None   Collection Time    07/12/12  5:15 AM      Result Value Range Status   Specimen Description BLOOD RIGHT ANTECUBITAL   Final   Special Requests BOTTLES DRAWN AEROBIC ONLY 2CC   Final   Culture  Setup Time 07/12/2012 10:07   Final   Culture NO GROWTH 5 DAYS   Final   Report Status 07/18/2012 FINAL   Final  CLOSTRIDIUM DIFFICILE BY PCR     Status: None   Collection Time    07/15/12  4:39 PM      Result Value Range Status  C difficile by pcr NEGATIVE  NEGATIVE Final     Studies: No results found.  Scheduled Meds: . aspirin  81 mg Oral Daily  . enoxaparin (LOVENOX) injection  40 mg Subcutaneous Q24H  . feeding supplement  1 Container Oral BID  . guaiFENesin  600 mg Oral BID  . insulin aspart  0-9 Units Subcutaneous TID AC & HS  . lactose free nutrition  237 mL Oral BID BM  . latanoprost  1 drop Both Eyes QHS  . nystatin  5 mL Mouth/Throat QID  . pantoprazole  40 mg Oral Daily  . simvastatin  20 mg Oral q1800  . sodium chloride  3 mL Intravenous Q12H  . tiotropium  18 mcg Inhalation Daily   Continuous Infusions:    Principal Problem:   Acute diastolic heart failure Active Problems:   Lung cancer   Hypotension   Hyponatremia   Abnormal ECG   Dehydration   Failure to thrive   Diabetes   Debility    Time spent:  25 minutes     Ruchi Stoney K  [facility] Pager [phone] If 7PM-7AM, please contact night-coverage at [email or web address], password [facility] 07/21/2012, 1:58 PM  LOS: 10 days

## 2012-07-21 NOTE — Plan of Care (Signed)
Problem: Phase III Progression Outcomes Goal: Foley discontinued Outcome: Not Applicable Date Met:  07/21/12 Has condom cath.

## 2012-07-21 NOTE — Progress Notes (Signed)
Attempted to weaned oxygen down to3L via n/c at rest sat maintained at 92%. Ambulated patient to door and he states that he felt short of breath oxygen level dropped down to 89 -90%. After patient returned to chair and rested oxygen level came up to 97 on 3 L. Will maintain oxygen level at 3 liters for now and continue to monitor.

## 2012-07-22 DIAGNOSIS — C3490 Malignant neoplasm of unspecified part of unspecified bronchus or lung: Secondary | ICD-10-CM

## 2012-07-22 LAB — BASIC METABOLIC PANEL
CO2: 30 mEq/L (ref 19–32)
Calcium: 8.2 mg/dL — ABNORMAL LOW (ref 8.4–10.5)
Chloride: 94 mEq/L — ABNORMAL LOW (ref 96–112)
Creatinine, Ser: 1.24 mg/dL (ref 0.50–1.35)
GFR calc Af Amer: 64 mL/min — ABNORMAL LOW (ref >90)
Sodium: 133 mEq/L — ABNORMAL LOW (ref 135–145)

## 2012-07-22 LAB — GLUCOSE, CAPILLARY: Glucose-Capillary: 159 mg/dL — ABNORMAL HIGH (ref 70–99)

## 2012-07-22 MED ORDER — BOOST PLUS PO LIQD: 237.0000 mL | Freq: Two times a day (BID) | ORAL | Status: DC

## 2012-07-22 MED ORDER — HEPARIN SOD (PORK) LOCK FLUSH 100 UNIT/ML IV SOLN
500.0000 [IU] | INTRAVENOUS | Status: AC | PRN
  Administered 2012-07-22: 500 [IU]

## 2012-07-22 MED ORDER — ALBUTEROL SULFATE (5 MG/ML) 0.5% IN NEBU: 2.5000 mg | INHALATION_SOLUTION | RESPIRATORY_TRACT | Status: AC | PRN

## 2012-07-22 MED ORDER — ASPIRIN 81 MG PO CHEW: 81.0000 mg | CHEWABLE_TABLET | Freq: Every day | ORAL | Status: DC

## 2012-07-22 MED ORDER — HYDROCODONE-ACETAMINOPHEN 5-325 MG PO TABS: 1.0000 | ORAL_TABLET | ORAL | Status: DC | PRN

## 2012-07-22 MED ORDER — ALPRAZOLAM 0.5 MG PO TABS: 0.5000 mg | ORAL_TABLET | Freq: Three times a day (TID) | ORAL | Status: DC | PRN

## 2012-07-22 NOTE — Discharge Summary (Addendum)
Physician Discharge Summary  [name] UJW:[id number] DOB: Dec 08, 1935 DOA: 07/11/2012  PCP: [name], MD  Admit date: 07/11/2012 Discharge date: 07/22/2012  Time spent: 35 minutes  Recommendations for Outpatient Follow-up:  1. Patient will be followed by home health with PT and OT 2. He will see his primary care physician in the next 4-6 weeks 3. He has a scheduled appointment for chemotherapy on Tuesday 5/13. He is advised to call to the oncology office on 5/12 to confirm the appointment still on  Discharge Diagnoses:  Principal Problem:   Acute diastolic heart failure Active Problems:   Lung cancer   Obstructive chronic bronchitis with exacerbation Golds C COPD   Hypotension   Hyponatremia   Abnormal ECG   Dehydration   Failure to thrive   Diabetes   Debility   Discharge Condition: Improved from being discharged home  Diet recommendation: Carb modified heart healthy  Filed Weights   07/20/12 0545 07/21/12 0624 07/22/12 0627  Weight: 80.7 kg (177 lb 14.6 oz) 79.062 kg (174 lb 4.8 oz) 77.5 kg (170 lb 13.7 oz)    History of present illness:  77 year old white male past medical history of diabetes, hypertension and receiving ongoing chemotherapy for stage IIIB/4 non-small cell lung cancer-squamous cell carcinoma who presented with complaints of generalized weakness on 5/1 and found to have hypotension with a systolic blood pressure in the 90s. Patient was admitted to thrive initially  Hospital Course:  Principal Problem:   Acute diastolic heart failure: Patient had an echocardiogram done on 5/12 determine if his hypotension was heart related. It only noted grade 1 diastolic dysfunction. Patient continued to receive aggressive fluid resuscitation for his hydration issues. By 5/7, he was noted to be positive for 20 L of fluid. At this point, the patient looked to be volume replete actually volume overloaded. He was started on Lasix 20 mg IV twice a day and this was  slowly titrated up to 40 IV twice a day. Over the next few days, he diuresed 7-1/2 L. By 5/10, patient's creatinine was trending up slightly and so his IV Lasix was discontinued. Family has been instructed on CHF and the patient will be discharged on 20 mg of Lasix by mouth daily. Because of the patient's borderline blood pressures, he would not tolerate a beta blocker or ACE inhibitor even at low dose. This may need to be reevaluated in the future the patient improves and blood pressure becomes much stronger.    Lung cancer: Seen by oncology. Plan for followup in office on Tuesday.    Pneumonia: Postobstructive pneumonia from lung cancer versus healthcare associated: Chest x-ray really was unremarkable on admission, however this could be limited findings you to dehydration. On hospital day 2, patient had documented fever of 101.8 and was again coughing and short of breath in general. He was started on broad-spectrum antibiotics. These were able to be weaned to by mouth Levaquin the patient completed a full antibiotic course prior to discharge. It was noted that his oxygen on have increased and with ambulation, his O2 sats didn't drop. This is in part due to his lung cancer. There is also some component due to acute diastolic heart failure. Following diuresis, patient has been able to be weaned down from 4.5 L to 3 L with activity.  Obstructive chronic bronchitis with exacerbation Golds C COPD    Hypotension: This is more of a chronic process do to weakness, dehydration, poor by mouth intake. Improved on admission fluids, but even  with further aggressive hydration and in volume overload and then diuresis patient's blood pressure state around it 99 systolic. Discontinue Norvasc. This is felt to be in the setting of poor po intake.     Hyponatremia: Mild. Secondary dehydration. Sodium 1 day of discharge is 133    Abnormal ECG: On admission patient was noted to have some ST depression on his EKG. 1 on 5/1  noted persist. He had no events on telemetry during his entire hospitalization. EKG changes of a secondary demand ischemia due to hypotension on admission. Echocardiogram was done noting no wall motion abnormalities and an EF that was preserved.  Recent thrush: Patient was continued on nystatin and Diflucan to be secondary to antibiotics. Not as antibiotic center finished, discontinued. Thrush resolved.    Dehydration: Second of poor by mouth intake. Resolved.    Failure to thrive: Secondary to cancer and treatments plus acute illness. His appetite has improved with treating his pneumonia and diuresing him. In addition he's been added on boost supplements.    Diabetes: Patient has been managed with sliding scale only. His intake has been very poor due to illness. It is noted that at the end of his hospitalization as he was better diuresed and from her comfortable, his appetite has improved. Is advised to restart his diabetic medications in one week.    Debility: Patient evaluated by physical therapy her recommending home health PT and OT. This is been set up along with equipment. Patient is significantly weak and will need a large amount of therapy.   Procedures:  Echocardiogram done 5/1: Grade 1 diastolic dysfunction  Consultations:  Oncology-Mohamed  Discharge Exam: Filed Vitals:   07/21/12 1800 07/21/12 2102 07/22/12 0627 07/22/12 0745  BP:  99/50 92/47   Pulse:  88 93   Temp:  97.7 F (36.5 C) 97.8 F (36.6 C)   TempSrc:  Oral Oral   Resp:  20 18   Height:      Weight:   77.5 kg (170 lb 13.7 oz)   SpO2: 97% 93% 95% 91%    General: Alert and oriented x3, no acute distress, fatigued Cardiovascular: Regular rate and rhythm, S1-S2, soft 2/6 systolic ejection murmur Respiratory: Good airway flow, scattered rhonchi, upper airway noise Abdomen: Soft, nontender, nondistended, positive bowel sounds Clubbing or cyanosis, trace pitting edema  Discharge Instructions  Discharge  Orders   Future Appointments Provider Department Dept Phone   07/24/2012 9:00 AM Krista Blue [facility] MEDICAL ONCOLOGY [phone]   07/24/2012 9:30 AM Si Gaul, MD [facility] MEDICAL ONCOLOGY [phone]   07/24/2012 10:00 AM [facility] MEDICAL ONCOLOGY [phone]   07/24/2012 10:30 AM Anabel Bene, RD [facility] MEDICAL ONCOLOGY [phone]   08/03/2012 3:30 PM Storm Frisk, MD [facility] [phone]   Future Orders Complete By Expires     Diet - low sodium heart healthy  As directed     Diet Carb Modified  As directed     Increase activity slowly  As directed     Walk with assistance  As directed         Medication List    STOP taking these medications       enalapril 5 MG tablet  Commonly known as:  VASOTEC  Patient advised to resume all diabetic medications in one week.       TAKE these medications       albuterol (5 MG/ML) 0.5%  nebulizer solution  Commonly known as:  PROVENTIL  Take 0.5 mLs (2.5 mg total) by nebulization every 4 (four) hours as needed for wheezing.     ALPRAZolam 0.5 MG tablet  Commonly known as:  XANAX  Take 1 tablet (0.5 mg total) by mouth 3 (three) times daily as needed for anxiety.     aspirin 81 MG chewable tablet  Chew 1 tablet (81 mg total) by mouth daily.     dexamethasone 4 MG tablet  Commonly known as:  DECADRON  Take 1 tablet (4 mg total) by mouth as directed. 2 tablets BID day before, day of, day after chemotherapy.     glimepiride 4 MG tablet  Commonly known as:  AMARYL  2 tabs daily with breakfast     HYDROcodone-acetaminophen 5-325 MG per tablet  Commonly known as:  NORCO/VICODIN  Take 1-2 tablets by mouth every 4 (four) hours as needed.     ibuprofen 400 MG tablet  Commonly known as:  ADVIL,MOTRIN  Take 400 mg by mouth every 8 (eight) hours as needed for pain.     lactose free nutrition Liqd  Take 237 mLs by mouth 2  (two) times daily between meals.     latanoprost 0.005 % ophthalmic solution  Commonly known as:  XALATAN  Place 1 drop into both eyes at bedtime.     loratadine 10 MG tablet  Commonly known as:  CLARITIN  Take 10 mg by mouth as needed.     lovastatin 20 MG tablet  Commonly known as:  MEVACOR  Take 20 mg by mouth at bedtime.     NON FORMULARY  Pt is currently receiving CHemotherapy     ondansetron 8 MG tablet  Commonly known as:  ZOFRAN  Take 1 tablet (8 mg total) by mouth every 8 (eight) hours as needed (as needed for nausea and vomiting).     pioglitazone 30 MG tablet  Commonly known as:  ACTOS  Take 30 mg by mouth daily.     prochlorperazine 10 MG tablet  Commonly known as:  COMPAZINE  Take 1 tablet (10 mg total) by mouth every 6 (six) hours as needed.     sitaGLIPtin 100 MG tablet  Commonly known as:  JANUVIA  Take 100 mg by mouth daily.     tiotropium 18 MCG inhalation capsule  Commonly known as:  SPIRIVA HANDIHALER  Place 1 capsule (18 mcg total) into inhaler and inhale daily.    Furosemide 20 mg tablet Commonly known as: Lasix Take 20 mg by mouth daily    Allergies  Allergen Reactions  . Codeine Other (See Comments)    Made pt unrepsonsive  . Penicillins        Follow-up Information   Follow up with [name], MD In 6 weeks.   Contact information:   [address] [place] [address] [phone]       Follow up with Lajuana Matte., MD. (Call on Monday 5/12 to confirm chemotherapy still to be done on Tuesday 5/13.)    Contact information:   [address] [place] [address] [phone]        The results of significant diagnostics from this hospitalization (including imaging, microbiology, ancillary and laboratory) are listed below for reference.    Significant Diagnostic Studies: Dg Chest Portable 1 View  07/11/2012   IMPRESSION:  1.  No definite acute abnormality. 2.  Mass effect and consolidation at the left  lung base appears similar to prior CT  when allowing for differences in technique.  No definite superimposed acute abnormality.  Postobstructive pneumonia associated with left lower lobe mass merits consideration if the patient has an elevated white blood cell count.   Original Report Authenticated By: Andreas Newport, M.D.     Microbiology: Recent Results (from the past 240 hour(s))  CLOSTRIDIUM DIFFICILE BY PCR     Status: None   Collection Time    07/15/12  4:39 PM      Result Value Range Status   C difficile by pcr NEGATIVE  NEGATIVE Final     Labs: Basic Metabolic Panel:  Recent Labs Lab 07/16/12 0345 07/17/12 0435 07/19/12 0342 07/21/12 0312 07/22/12 0500  NA 133* 133*  --  134* 133*  K 3.3* 3.8  --  3.1* 3.7  CL 100 100  --  92* 94*  CO2 26 26  --  31 30  GLUCOSE 137* 151*  --  110* 139*  BUN 9 6  --  15 19  CREATININE 0.84 0.80 0.84 1.28 1.24  CALCIUM 7.4* 7.5*  --  8.7 8.2*   CBC:  Recent Labs Lab 07/16/12 0345 07/18/12 0545  WBC 5.5 5.7  HGB 6.4* 7.5*  HCT 19.3* 23.6*  MCV 86.2 87.4  PLT 102* 104*   BNP: BNP (last 3 results)  Recent Labs  07/18/12 1535 07/21/12 0311  PROBNP 2615.0* 1094.0*   CBG:  Recent Labs Lab 07/21/12 0745 07/21/12 1209 07/21/12 1705 07/21/12 2200 07/22/12 0732  GLUCAP 112* 195* 168* 138* 159*       Signed:  Ariann Khaimov K  [facility] 07/22/2012, 10:38 AM

## 2012-07-23 ENCOUNTER — Telehealth: Payer: Self-pay | Admitting: Dietician

## 2012-07-23 ENCOUNTER — Other Ambulatory Visit: Payer: Self-pay | Admitting: Medical Oncology

## 2012-07-23 MED ORDER — NEBULIZER/ADULT MASK KIT: PACK | Status: DC

## 2012-07-23 NOTE — Telephone Encounter (Signed)
, °

## 2012-07-24 ENCOUNTER — Telehealth: Payer: Self-pay | Admitting: *Deleted

## 2012-07-24 ENCOUNTER — Telehealth: Payer: Self-pay | Admitting: Internal Medicine

## 2012-07-24 ENCOUNTER — Encounter: Payer: Medicare Other | Admitting: Nutrition

## 2012-07-24 ENCOUNTER — Encounter: Payer: Self-pay | Admitting: Internal Medicine

## 2012-07-24 ENCOUNTER — Ambulatory Visit: Payer: Medicare Other

## 2012-07-24 ENCOUNTER — Other Ambulatory Visit (HOSPITAL_BASED_OUTPATIENT_CLINIC_OR_DEPARTMENT_OTHER): Payer: Medicare Other | Admitting: Lab

## 2012-07-24 ENCOUNTER — Ambulatory Visit (HOSPITAL_BASED_OUTPATIENT_CLINIC_OR_DEPARTMENT_OTHER): Payer: Medicare Other | Admitting: Internal Medicine

## 2012-07-24 DIAGNOSIS — R197 Diarrhea, unspecified: Secondary | ICD-10-CM

## 2012-07-24 DIAGNOSIS — C3430 Malignant neoplasm of lower lobe, unspecified bronchus or lung: Secondary | ICD-10-CM

## 2012-07-24 DIAGNOSIS — E860 Dehydration: Secondary | ICD-10-CM

## 2012-07-24 DIAGNOSIS — R5383 Other fatigue: Secondary | ICD-10-CM

## 2012-07-24 DIAGNOSIS — C3490 Malignant neoplasm of unspecified part of unspecified bronchus or lung: Secondary | ICD-10-CM

## 2012-07-24 LAB — CBC WITH DIFFERENTIAL/PLATELET
Basophils Absolute: 0 10*3/uL (ref 0.0–0.1)
EOS%: 0 % (ref 0.0–7.0)
MCHC: 32.1 g/dL (ref 32.0–36.0)
MCV: 90.5 fL (ref 79.3–98.0)
MONO#: 0.2 10*3/uL (ref 0.1–0.9)
NEUT#: 10.9 10*3/uL — ABNORMAL HIGH (ref 1.5–6.5)
NEUT%: 92.9 % — ABNORMAL HIGH (ref 39.0–75.0)
Platelets: 147 10*3/uL (ref 140–400)
RBC: 3.27 10*6/uL — ABNORMAL LOW (ref 4.20–5.82)
RDW: 18.2 % — ABNORMAL HIGH (ref 11.0–14.6)
WBC: 11.8 10*3/uL — ABNORMAL HIGH (ref 4.0–10.3)

## 2012-07-24 NOTE — Telephone Encounter (Signed)
Per staff message and POF I have scheduled appts.  JMW  

## 2012-07-24 NOTE — Telephone Encounter (Signed)
gv and printed appt sched and avs for pt for may and June....MW added tx.Marland KitchenMarland KitchenMarland Kitchen

## 2012-07-24 NOTE — Patient Instructions (Signed)
We discussed taking a break off chemotherapy for the next 2 weeks. Follow up visit in 2 weeks with the start of the weekly Taxotere.

## 2012-07-24 NOTE — Progress Notes (Signed)
[facility] [phone]   [phone]  OFFICE PROGRESS NOTE  [name], MD [address] [place] [address]  PRINCIPAL DIAGNOSIS: Stage IIIB/IV non-small cell lung cancer, squamous cell carcinoma diagnosed in June 2012.   PRIOR THERAPY:  1) Status post a course of concurrent chemoradiation with weekly carboplatin and paclitaxel. Last dose of chemotherapy was given on November 01, 2010.  2) Systemic chemotherapy with carboplatin for an AUC of 5 given on day 1 and gemcitabine 1000 mg per meter square given on days 1 and 8 every 3 weeks status post 1 cycle. However due to thrombocytopenia from cycle 2 forward he will proceed with carboplatin for an AUC of 5 given on day 1 and gemcitabine at 800 mg per meter squared given on days 1 and 8 every 3 weeks. Status post 6 cycles with evidence for disease progression after cycle #6.  3) Palliative radiotherapy to the enlarging left lower lobe lung mass under the care of Dr. Kathrynn Running.  4) Systemic chemotherapy with docetaxel 75 mg/M2 every 3 weeks with Neulasta support, status post 2 cycles, first cycle was started on 06/12/2012. That was discontinued today secondary to intolerance.   CURRENT THERAPY: Systemic chemotherapy with docetaxel 25 mg/M2 every week, first cycle is expected on 08/07/2012.   INTERVAL HISTORY: [name] 77 y.o. male returns to the clinic today for followup visit accompanied by his wife. The patient was admitted to [facility] on 07/11/2012 and discharged on 07/22/2012 with dehydration, hypotension and was diagnosed with acute diastolic heart failure as well as postobstructive pneumonia. He received IV hydration as well as treatment with antibiotics for pneumonia. He also had diarrhea during his admission but C. Difficile was negative. He was also treated with nystatin and Diflucan for oral thrush which was resolved. The patient is here today for evaluation and  discussion of his systemic treatment options. He is to have mild fatigue and shortness breath with exertion and currently on home oxygen.  MEDICAL HISTORY: Past Medical History  Diagnosis Date  . Lung cancer 2012    recurrence 12/2011  . Diabetes mellitus   . HTN (hypertension)   . High cholesterol   . Hx of radiation therapy 09/22/10 - 11/05/10    LLL lung  . History of chemotherapy   . History of radiation therapy 04/24/12-05/15/12    lllung 35Gy/14dx    ALLERGIES:  is allergic to codeine and penicillins.  MEDICATIONS:  Current Outpatient Prescriptions  Medication Sig Dispense Refill  . albuterol (PROVENTIL) (5 MG/ML) 0.5% nebulizer solution Take 0.5 mLs (2.5 mg total) by nebulization every 4 (four) hours as needed for wheezing.  20 mL  3  . ALPRAZolam (XANAX) 0.5 MG tablet Take 1 tablet (0.5 mg total) by mouth 3 (three) times daily as needed for anxiety.  60 tablet  0  . aspirin 81 MG chewable tablet Chew 1 tablet (81 mg total) by mouth daily.      Marland Kitchen dexamethasone (DECADRON) 4 MG tablet Take 1 tablet (4 mg total) by mouth as directed. 2 tablets BID day before, day of, day after chemotherapy.  40 tablet  1  . glimepiride (AMARYL) 4 MG tablet 2 tabs daily with breakfast      . HYDROcodone-acetaminophen (NORCO/VICODIN) 5-325 MG per tablet Take 1-2 tablets by mouth every 4 (four) hours as needed.  60 tablet  0  . ibuprofen (ADVIL,MOTRIN) 400 MG tablet Take 400 mg by mouth every 8 (eight)  hours as needed for pain.      Marland Kitchen lactose free nutrition (BOOST PLUS) LIQD Take 237 mLs by mouth 2 (two) times daily between meals.      . latanoprost (XALATAN) 0.005 % ophthalmic solution Place 1 drop into both eyes at bedtime.       Marland Kitchen loratadine (CLARITIN) 10 MG tablet Take 10 mg by mouth as needed.       . lovastatin (MEVACOR) 20 MG tablet Take 20 mg by mouth at bedtime.      . NON FORMULARY Pt is currently receiving CHemotherapy      . ondansetron (ZOFRAN) 8 MG tablet Take 1 tablet (8 mg total) by  mouth every 8 (eight) hours as needed (as needed for nausea and vomiting).  30 tablet  1  . pioglitazone (ACTOS) 30 MG tablet Take 30 mg by mouth daily.       . prochlorperazine (COMPAZINE) 10 MG tablet Take 1 tablet (10 mg total) by mouth every 6 (six) hours as needed.  30 tablet  0  . Respiratory Therapy Supplies (NEBULIZER/ADULT MASK) KIT Use as directed.  1 each  0  . sitaGLIPtin (JANUVIA) 100 MG tablet Take 100 mg by mouth daily.      Marland Kitchen tiotropium (SPIRIVA HANDIHALER) 18 MCG inhalation capsule Place 1 capsule (18 mcg total) into inhaler and inhale daily.  30 capsule  6   No current facility-administered medications for this visit.   Facility-Administered Medications Ordered in Other Visits  Medication Dose Route Frequency Provider Last Rate Last Dose  . sodium chloride 0.9 % injection 10 mL  10 mL Intracatheter PRN Si Gaul, MD   10 mL at 11/24/11 1637    SURGICAL HISTORY:  Past Surgical History  Procedure Laterality Date  . Tonsillectomy    . Gastrostomy tube placement  2011    has been removed  . Portacath placement  08/2010    REVIEW OF SYSTEMS:  A comprehensive review of systems was negative except for: Constitutional: positive for fatigue Respiratory: positive for dyspnea on exertion   PHYSICAL EXAMINATION: General appearance: alert, cooperative, fatigued and no distress Head: Normocephalic, without obvious abnormality, atraumatic Neck: no adenopathy Lymph nodes: Cervical, supraclavicular, and axillary nodes normal. Resp: wheezes LUL Cardio: regular rate and rhythm, S1, S2 normal, no murmur, click, rub or gallop GI: soft, non-tender; bowel sounds normal; no masses,  no organomegaly Extremities: extremities normal, atraumatic, no cyanosis or edema Neurologic: Alert and oriented X 3, normal strength and tone. Normal symmetric reflexes. Normal coordination and gait  ECOG PERFORMANCE STATUS: 2 - Symptomatic, <50% confined to bed  Blood pressure 98/50, pulse 90,  temperature 97.6 F (36.4 C), temperature source Oral, resp. rate 17, height 5\' 10"  (1.778 m), weight 175 lb 11.2 oz (79.697 kg).  LABORATORY DATA: Lab Results  Component Value Date   WBC 11.8* 07/24/2012   HGB 9.5* 07/24/2012   HCT 29.6* 07/24/2012   MCV 90.5 07/24/2012   PLT 147 07/24/2012      Chemistry      Component Value Date/Time   NA 133* 07/22/2012 0500   NA 133* 07/10/2012 0834   NA 134 10/19/2011 0817   K 3.7 07/22/2012 0500   K 4.8 07/10/2012 0834   K 4.5 10/19/2011 0817   CL 94* 07/22/2012 0500   CL 99 07/10/2012 0834   CL 99 10/19/2011 0817   CO2 30 07/22/2012 0500   CO2 21* 07/10/2012 0834   CO2 27 10/19/2011 0817   BUN 19 07/22/2012  0500   BUN 39.6* 07/10/2012 0834   BUN 13 10/19/2011 0817   CREATININE 1.24 07/22/2012 0500   CREATININE 1.6* 07/10/2012 0834   CREATININE 1.4* 10/19/2011 0817      Component Value Date/Time   CALCIUM 8.2* 07/22/2012 0500   CALCIUM 9.2 07/10/2012 0834   CALCIUM 9.3 10/19/2011 0817   ALKPHOS 77 07/12/2012 0520   ALKPHOS 81 07/10/2012 0834   ALKPHOS 48 10/19/2011 0817   AST 12 07/12/2012 0520   AST 11 07/10/2012 0834   AST 19 10/19/2011 0817   ALT 13 07/12/2012 0520   ALT 17 07/10/2012 0834   BILITOT 0.6 07/12/2012 0520   BILITOT 1.19 07/10/2012 0834   BILITOT 0.90 10/19/2011 0817       RADIOGRAPHIC STUDIES: Dg Chest Portable 1 View  07/11/2012  *RADIOLOGY REPORT*  Clinical Data: Weakness.  History of lung cancer.  Diabetes.  COPD.  PORTABLE CHEST - 1 VIEW  Comparison: 06/06/2012.  Findings: Left lower lobe collapse / consolidation associated with mass lesion.  Allowing for differences in technique, this appears little changed compared to 06/06/2012.  The right lung appears clear.  The right IJ Port-A-Cath. Monitoring leads are projected over the chest. No pneumothorax.  Bilateral AC joint osteoarthritis.  IMPRESSION:  1.  No definite acute abnormality. 2.  Mass effect and consolidation at the left lung base appears similar to prior CT when allowing for differences in  technique.  No definite superimposed acute abnormality.  Postobstructive pneumonia associated with left lower lobe mass merits consideration if the patient has an elevated white blood cell count.   Original Report Authenticated By: Andreas Newport, M.D.     ASSESSMENT:  This is a very pleasant 77 years old white malewith recurrent non-small cell lung cancer most recently treated with systemic chemotherapy with docetaxel every 3 weeks but unfortunately the patient was unable to tolerate this treatment with significant fatigue and weakness as well as dehydration secondary to diarrhea.   PLAN: I have a lengthy discussion with the patient and his wife today about his condition and treatment options. I recommended for the patient to take 2 more weeks off chemotherapy for recovery. I would consider him with treatment with weekly docetaxel 25 mg/M2 on 08/07/2012. I discussed with the patient adverse effect of this treatment including but not limited to alopecia, myelosuppression, nausea and vomiting, peripheral neuropathy, liver or renal dysfunction. The patient would like to proceed with treatment as planned. He would come back for follow up visit at that time. He was advised to call immediately if he has any concerning symptoms in the interval.  All questions were answered. The patient knows to call the clinic with any problems, questions or concerns. We can certainly see the patient much sooner if necessary.

## 2012-07-25 ENCOUNTER — Ambulatory Visit: Payer: Medicare Other

## 2012-07-27 ENCOUNTER — Ambulatory Visit: Payer: Medicare Other

## 2012-08-03 ENCOUNTER — Encounter: Payer: Self-pay | Admitting: Critical Care Medicine

## 2012-08-03 ENCOUNTER — Ambulatory Visit (INDEPENDENT_AMBULATORY_CARE_PROVIDER_SITE_OTHER): Payer: Medicare Other | Admitting: Critical Care Medicine

## 2012-08-03 VITALS — BP 100/54 | HR 62 | Temp 97.2°F | Ht 70.0 in | Wt 173.0 lb

## 2012-08-03 DIAGNOSIS — J449 Chronic obstructive pulmonary disease, unspecified: Secondary | ICD-10-CM

## 2012-08-03 DIAGNOSIS — J441 Chronic obstructive pulmonary disease with (acute) exacerbation: Secondary | ICD-10-CM

## 2012-08-03 NOTE — Progress Notes (Signed)
Subjective:    Patient ID: [name], male    DOB: February 07, 1936, 77 y.o.   MRN: [id number]  HPI  Data from Oncology Dept: PRINCIPAL DIAGNOSIS: Stage IIIB/IV non-small cell lung cancer diagnosed in June 2012.  PRIOR THERAPY: Status post a course of concurrent chemoradiation with weekly carboplatin and paclitaxel. Last dose of chemotherapy was given on November 01, 2010.  CURRENT THERAPY: Systemic chemotherapy with carboplatin for an AUC of 5 given on day 1 and gemcitabine 1000 mg per meter square given on days 1 and 8 every 3 weeks status post 1 cycle. However due to thrombocytopenia from cycle 2 forward he will proceed with carboplatin for an AUC of 5 given on day 1 and gemcitabine at 800 mg per meter squared given on days 1 and 8 every 3 weeks. Status post 3 cycles   04/04/2012 Pt still on chemorx.  Breathing is better. Not much cough. No real wheeze.  No edema in feet. No real nasal drainage.    Pt denies any significant sore throat, nasal congestion or excess secretions, fever, chills, sweats, unintended weight loss, pleurtic or exertional chest pain, orthopnea PND, or leg swelling Pt denies any increase in rescue therapy over baseline, denies waking up needing it or having any early am or nocturnal exacerbations of coughing/wheezing/or dyspnea. Pt also denies any obvious fluctuation in symptoms with  weather or environmental change or other alleviating or aggravating factors  08/03/2012 In hosp 4/30--5/11 dx acute diastolic CHF.   Also post obstr PNA and Rx levaquin in hosp Now on 2.5L rest 3L exertion. New Rx for oxygen. PT and OT at home. Never got neb med machine.  Albuterol was given BS high at home.  No edema in feet. Pt is still dyspneic.  No chest pain. No fever.  Notes some mucus, productive clear.  Occ yellow.      Past Medical History  Diagnosis Date  . Lung cancer 2012    recurrence 12/2011  . Diabetes mellitus   . HTN (hypertension)   . High cholesterol   . Hx of  radiation therapy 09/22/10 - 11/05/10    LLL lung  . History of chemotherapy   . History of radiation therapy 04/24/12-05/15/12    lllung 35Gy/14dx     Family History  Problem Relation Age of Onset  . Heart disease Mother   . Heart disease Father   . Breast cancer Paternal Aunt      History   Social History  . Marital Status: Married    Spouse Name: N/A    Number of Children: N/A  . Years of Education: N/A   Occupational History  . retired    Social History Main Topics  . Smoking status: Former Smoker -- 4.00 packs/day for 34 years    Types: Cigarettes    Quit date: 03/15/1987  . Smokeless tobacco: Not on file  . Alcohol Use: No  . Drug Use: No  . Sexually Active: Not on file   Other Topics Concern  . Not on file   Social History Narrative  . No narrative on file     Allergies  Allergen Reactions  . Codeine Other (See Comments)    Made pt unrepsonsive  . Penicillins      Outpatient Prescriptions Prior to Visit  Medication Sig Dispense Refill  . ALPRAZolam (XANAX) 0.5 MG tablet Take 1 tablet (0.5 mg total) by mouth 3 (three) times daily as needed for anxiety.  60 tablet  0  .  aspirin 81 MG chewable tablet Chew 1 tablet (81 mg total) by mouth daily.      Marland Kitchen dexamethasone (DECADRON) 4 MG tablet Take 1 tablet (4 mg total) by mouth as directed. 2 tablets BID day before, day of, day after chemotherapy.  40 tablet  1  . glimepiride (AMARYL) 4 MG tablet 2 tabs daily with breakfast      . HYDROcodone-acetaminophen (NORCO/VICODIN) 5-325 MG per tablet Take 1-2 tablets by mouth every 4 (four) hours as needed.  60 tablet  0  . ibuprofen (ADVIL,MOTRIN) 400 MG tablet Take 400 mg by mouth every 8 (eight) hours as needed for pain.      Marland Kitchen lactose free nutrition (BOOST PLUS) LIQD Take 237 mLs by mouth 2 (two) times daily between meals.      . latanoprost (XALATAN) 0.005 % ophthalmic solution Place 1 drop into both eyes at bedtime.       Marland Kitchen loratadine (CLARITIN) 10 MG tablet Take 10 mg  by mouth as needed.       . lovastatin (MEVACOR) 20 MG tablet Take 20 mg by mouth at bedtime.      . NON FORMULARY Pt is currently receiving CHemotherapy      . ondansetron (ZOFRAN) 8 MG tablet Take 1 tablet (8 mg total) by mouth every 8 (eight) hours as needed (as needed for nausea and vomiting).  30 tablet  1  . pioglitazone (ACTOS) 30 MG tablet Take 30 mg by mouth daily.       . prochlorperazine (COMPAZINE) 10 MG tablet Take 1 tablet (10 mg total) by mouth every 6 (six) hours as needed.  30 tablet  0  . sitaGLIPtin (JANUVIA) 100 MG tablet Take 100 mg by mouth daily.      Marland Kitchen tiotropium (SPIRIVA HANDIHALER) 18 MCG inhalation capsule Place 1 capsule (18 mcg total) into inhaler and inhale daily.  30 capsule  6  . albuterol (PROVENTIL) (5 MG/ML) 0.5% nebulizer solution Take 0.5 mLs (2.5 mg total) by nebulization every 4 (four) hours as needed for wheezing.  20 mL  3  . Respiratory Therapy Supplies (NEBULIZER/ADULT MASK) KIT Use as directed.  1 each  0   Facility-Administered Medications Prior to Visit  Medication Dose Route Frequency Provider Last Rate Last Dose  . sodium chloride 0.9 % injection 10 mL  10 mL Intracatheter PRN Si Gaul, MD   10 mL at 11/24/11 1637      Review of Systems  Constitutional: Positive for chills and fatigue. Negative for diaphoresis, activity change, appetite change and unexpected weight change.  HENT: Positive for congestion. Negative for hearing loss, nosebleeds, facial swelling, sneezing, mouth sores, trouble swallowing, neck stiffness, dental problem, voice change, postnasal drip, sinus pressure, tinnitus and ear discharge.   Eyes: Negative for photophobia, discharge, itching and visual disturbance.  Respiratory: Positive for cough. Negative for apnea, choking, chest tightness and stridor.   Cardiovascular: Negative for palpitations.  Gastrointestinal: Positive for nausea. Negative for constipation, blood in stool and abdominal distention.  Genitourinary:  Negative for dysuria, urgency, frequency, hematuria, flank pain, decreased urine volume and difficulty urinating.  Musculoskeletal: Negative for myalgias, back pain, joint swelling, arthralgias and gait problem.  Skin: Negative for color change and pallor.  Neurological: Positive for dizziness. Negative for tremors, seizures, syncope, speech difficulty, weakness, light-headedness and numbness.  Hematological: Negative for adenopathy. Does not bruise/bleed easily.  Psychiatric/Behavioral: Negative for confusion, sleep disturbance and agitation. The patient is not nervous/anxious.        Objective:  Physical Exam  Filed Vitals:   08/03/12 1536  BP: 100/54  Pulse: 62  Temp: 97.2 F (36.2 C)  Height: 5\' 10"  (1.778 m)  Weight: 78.472 kg (173 lb)  SpO2: 97%    Gen: Pleasant, well-nourished, in no distress,  normal affect  ENT: No lesions,  mouth clear,  oropharynx clear, no postnasal drip  Neck: No JVD, no TMG, no carotid bruits  Lungs: No use of accessory muscles, no dullness to percussion, distant breath sounds  Cardiovascular: RRR, heart sounds normal, no murmur or gallops, no peripheral edema  Abdomen: soft and NT, no HSM,  BS normal  Musculoskeletal: No deformities, no cyanosis or clubbing  Neuro: alert, non focal  Skin: Warm, no lesions or rashes        Assessment & Plan:   Obstructive chronic bronchitis with exacerbation Golds C COPD Gold C copd with difficulty now d/t lung CA and its Rx Plan Stay on albuterol three times daily by nebulizer Stay on spiriva Stay on oxygen Return 2 months     Updated Medication List Outpatient Encounter Prescriptions as of 08/03/2012  Medication Sig Dispense Refill  . ALPRAZolam (XANAX) 0.5 MG tablet Take 1 tablet (0.5 mg total) by mouth 3 (three) times daily as needed for anxiety.  60 tablet  0  . aspirin 81 MG chewable tablet Chew 1 tablet (81 mg total) by mouth daily.      Marland Kitchen dexamethasone (DECADRON) 4 MG tablet Take 1  tablet (4 mg total) by mouth as directed. 2 tablets BID day before, day of, day after chemotherapy.  40 tablet  1  . glimepiride (AMARYL) 4 MG tablet 2 tabs daily with breakfast      . HYDROcodone-acetaminophen (NORCO/VICODIN) 5-325 MG per tablet Take 1-2 tablets by mouth every 4 (four) hours as needed.  60 tablet  0  . ibuprofen (ADVIL,MOTRIN) 400 MG tablet Take 400 mg by mouth every 8 (eight) hours as needed for pain.      Marland Kitchen lactose free nutrition (BOOST PLUS) LIQD Take 237 mLs by mouth 2 (two) times daily between meals.      . latanoprost (XALATAN) 0.005 % ophthalmic solution Place 1 drop into both eyes at bedtime.       Marland Kitchen loratadine (CLARITIN) 10 MG tablet Take 10 mg by mouth as needed.       . lovastatin (MEVACOR) 20 MG tablet Take 20 mg by mouth at bedtime.      . NON FORMULARY Pt is currently receiving CHemotherapy      . ondansetron (ZOFRAN) 8 MG tablet Take 1 tablet (8 mg total) by mouth every 8 (eight) hours as needed (as needed for nausea and vomiting).  30 tablet  1  . pioglitazone (ACTOS) 30 MG tablet Take 30 mg by mouth daily.       . prochlorperazine (COMPAZINE) 10 MG tablet Take 1 tablet (10 mg total) by mouth every 6 (six) hours as needed.  30 tablet  0  . sitaGLIPtin (JANUVIA) 100 MG tablet Take 100 mg by mouth daily.      Marland Kitchen tiotropium (SPIRIVA HANDIHALER) 18 MCG inhalation capsule Place 1 capsule (18 mcg total) into inhaler and inhale daily.  30 capsule  6  . albuterol (PROVENTIL) (5 MG/ML) 0.5% nebulizer solution Take 0.5 mLs (2.5 mg total) by nebulization every 4 (four) hours as needed for wheezing.  20 mL  3  . Respiratory Therapy Supplies (NEBULIZER/ADULT MASK) KIT Use as directed.  1 each  0  Facility-Administered Encounter Medications as of 08/03/2012  Medication Dose Route Frequency Provider Last Rate Last Dose  . sodium chloride 0.9 % injection 10 mL  10 mL Intracatheter PRN Si Gaul, MD   10 mL at 11/24/11 1637

## 2012-08-03 NOTE — Patient Instructions (Addendum)
Stay on albuterol three times daily by nebulizer Stay on spiriva Stay on oxygen Return 2 months

## 2012-08-04 NOTE — Assessment & Plan Note (Signed)
Gold C copd with difficulty now d/t lung CA and its Rx Plan Stay on albuterol three times daily by nebulizer Stay on spiriva Stay on oxygen Return 2 months

## 2012-08-07 ENCOUNTER — Telehealth: Payer: Self-pay | Admitting: Medical Oncology

## 2012-08-07 NOTE — Telephone Encounter (Signed)
Reviewed premeds with pts wife.

## 2012-08-08 ENCOUNTER — Ambulatory Visit (HOSPITAL_BASED_OUTPATIENT_CLINIC_OR_DEPARTMENT_OTHER): Payer: Medicare Other | Admitting: Internal Medicine

## 2012-08-08 ENCOUNTER — Telehealth: Payer: Self-pay | Admitting: Internal Medicine

## 2012-08-08 ENCOUNTER — Other Ambulatory Visit (HOSPITAL_BASED_OUTPATIENT_CLINIC_OR_DEPARTMENT_OTHER): Payer: Medicare Other | Admitting: Lab

## 2012-08-08 ENCOUNTER — Encounter: Payer: Self-pay | Admitting: Internal Medicine

## 2012-08-08 ENCOUNTER — Ambulatory Visit (HOSPITAL_BASED_OUTPATIENT_CLINIC_OR_DEPARTMENT_OTHER): Payer: Medicare Other

## 2012-08-08 ENCOUNTER — Ambulatory Visit: Payer: Medicare Other | Admitting: Nutrition

## 2012-08-08 DIAGNOSIS — C3430 Malignant neoplasm of lower lobe, unspecified bronchus or lung: Secondary | ICD-10-CM

## 2012-08-08 DIAGNOSIS — Z5111 Encounter for antineoplastic chemotherapy: Secondary | ICD-10-CM

## 2012-08-08 LAB — CBC WITH DIFFERENTIAL/PLATELET
Basophils Absolute: 0 10*3/uL (ref 0.0–0.1)
Eosinophils Absolute: 0 10*3/uL (ref 0.0–0.5)
HCT: 29.1 % — ABNORMAL LOW (ref 38.4–49.9)
HGB: 9.1 g/dL — ABNORMAL LOW (ref 13.0–17.1)
LYMPH%: 7.6 % — ABNORMAL LOW (ref 14.0–49.0)
MCV: 92.4 fL (ref 79.3–98.0)
MONO%: 1.9 % (ref 0.0–14.0)
NEUT#: 11.1 10*3/uL — ABNORMAL HIGH (ref 1.5–6.5)
NEUT%: 90.3 % — ABNORMAL HIGH (ref 39.0–75.0)
Platelets: 208 10*3/uL (ref 140–400)
RBC: 3.15 10*6/uL — ABNORMAL LOW (ref 4.20–5.82)

## 2012-08-08 LAB — COMPREHENSIVE METABOLIC PANEL (CC13)
CO2: 22 mEq/L (ref 22–29)
Creatinine: 1.3 mg/dL (ref 0.7–1.3)
Glucose: 405 mg/dl — ABNORMAL HIGH (ref 70–99)
Total Bilirubin: 0.51 mg/dL (ref 0.20–1.20)

## 2012-08-08 MED ORDER — DEXAMETHASONE SODIUM PHOSPHATE 10 MG/ML IJ SOLN
10.0000 mg | Freq: Once | INTRAMUSCULAR | Status: AC
  Administered 2012-08-08: 10 mg via INTRAVENOUS

## 2012-08-08 MED ORDER — HEPARIN SOD (PORK) LOCK FLUSH 100 UNIT/ML IV SOLN
500.0000 [IU] | Freq: Once | INTRAVENOUS | Status: AC | PRN
  Administered 2012-08-08: 500 [IU]
  Filled 2012-08-08: qty 5

## 2012-08-08 MED ORDER — SODIUM CHLORIDE 0.9 % IV SOLN
Freq: Once | INTRAVENOUS | Status: AC
  Administered 2012-08-08: 11:00:00 via INTRAVENOUS

## 2012-08-08 MED ORDER — SODIUM CHLORIDE 0.9 % IV SOLN
25.0000 mg/m2 | Freq: Once | INTRAVENOUS | Status: AC
  Administered 2012-08-08: 50 mg via INTRAVENOUS
  Filled 2012-08-08: qty 5

## 2012-08-08 MED ORDER — ONDANSETRON 8 MG/50ML IVPB (CHCC)
8.0000 mg | Freq: Once | INTRAVENOUS | Status: AC
  Administered 2012-08-08: 8 mg via INTRAVENOUS

## 2012-08-08 MED ORDER — SODIUM CHLORIDE 0.9 % IJ SOLN
10.0000 mL | INTRAMUSCULAR | Status: DC | PRN
  Administered 2012-08-08: 10 mL
  Filled 2012-08-08: qty 10

## 2012-08-08 NOTE — Progress Notes (Signed)
Patient reports he feels well now. He had an 11 day stay in the hospital recently. His weight fluctuated up and down secondary to fluid shifts. Patient states he is eating well and is drinking less boost plus or Ensure Plus because he is eating more foods. He denies nutrition side effects.  Nutrition diagnosis: Inadequate oral intake has improved.  Intervention: I educated patient and wife on strategies for increased oral intake. I've encouraged patient to continue boost plus or Ensure Plus for additional protein and calories. I've encouraged stabilization of lean body mass.  Monitoring, evaluation, goals: Patient will consume adequate calories and protein to promote maintenance of lean body mass and improve quality-of-life.  Next visit: Tuesday, June 17, during chemotherapy.

## 2012-08-08 NOTE — Patient Instructions (Addendum)
[facility] Discharge Instructions for Patients Receiving Chemotherapy  Today you received the following chemotherapy agents Taxotere  To help prevent nausea and vomiting after your treatment, we encourage you to take your nausea medication as prescribed.   If you develop nausea and vomiting that is not controlled by your nausea medication, call the clinic. If it is after clinic hours your family physician or the after hours number for the clinic or go to the Emergency Department.   BELOW ARE SYMPTOMS THAT SHOULD BE REPORTED IMMEDIATELY:  *FEVER GREATER THAN 100.5 F  *CHILLS WITH OR WITHOUT FEVER  NAUSEA AND VOMITING THAT IS NOT CONTROLLED WITH YOUR NAUSEA MEDICATION  *UNUSUAL SHORTNESS OF BREATH  *UNUSUAL BRUISING OR BLEEDING  TENDERNESS IN MOUTH AND THROAT WITH OR WITHOUT PRESENCE OF ULCERS  *URINARY PROBLEMS  *BOWEL PROBLEMS  UNUSUAL RASH Items with * indicate a potential emergency and should be followed up as soon as possible.  Feel free to call the clinic you have any questions or concerns. The clinic phone number is [phone].   I have been informed and understand all the instructions given to me. I know to contact the clinic, my physician, or go to the Emergency Department if any problems should occur. I do not have any questions at this time, but understand that I may call the clinic during office hours   should I have any questions or need assistance in obtaining follow up care.    __________________________________________  _____________  __________ Signature of Patient or Authorized Representative            Date                   Time    __________________________________________ Nurse's Signature    

## 2012-08-08 NOTE — Telephone Encounter (Signed)
gv and printed appt sched and avs for pt....MW added tx   °

## 2012-08-08 NOTE — Patient Instructions (Signed)
Continue weekly chemotherapy today as scheduled. Followup visit in 2 weeks

## 2012-08-08 NOTE — Progress Notes (Signed)
[facility] [phone]   [phone]  OFFICE PROGRESS NOTE  [name], MD [address] [place] [address]  PRINCIPAL DIAGNOSIS: Stage IIIB/IV non-small cell lung cancer, squamous cell carcinoma diagnosed in June 2012.   PRIOR THERAPY:  1) Status post a course of concurrent chemoradiation with weekly carboplatin and paclitaxel. Last dose of chemotherapy was given on November 01, 2010.  2) Systemic chemotherapy with carboplatin for an AUC of 5 given on day 1 and gemcitabine 1000 mg per meter square given on days 1 and 8 every 3 weeks status post 1 cycle. However due to thrombocytopenia from cycle 2 forward he will proceed with carboplatin for an AUC of 5 given on day 1 and gemcitabine at 800 mg per meter squared given on days 1 and 8 every 3 weeks. Status post 6 cycles with evidence for disease progression after cycle #6.  3) Palliative radiotherapy to the enlarging left lower lobe lung mass under the care of Dr. Kathrynn Running.  4) Systemic chemotherapy with docetaxel 75 mg/M2 every 3 weeks with Neulasta support, status post 2 cycles, first cycle was started on 06/12/2012. That was discontinued today secondary to intolerance.   CURRENT THERAPY: Systemic chemotherapy with docetaxel 25 mg/M2 every week, first cycle is expected on 08/08/2012.   INTERVAL HISTORY: [name] 77 y.o. male returns to the clinic today for followup visit accompanied by his wife. The patient is feeling fine today with no specific complaints except for mild fatigue. He continues to have shortness of breath at baseline and increased with exertion and currently on home oxygen. Few days ago, the patient fell on the right side of his chest. He has mild pain in this area. He denied having any significant weight loss or night sweats. He is here today to start the first cycle of weekly docetaxel. He has no fever or chills.  MEDICAL HISTORY: Past Medical History    Diagnosis Date  . Lung cancer 2012    recurrence 12/2011  . Diabetes mellitus   . HTN (hypertension)   . High cholesterol   . Hx of radiation therapy 09/22/10 - 11/05/10    LLL lung  . History of chemotherapy   . History of radiation therapy 04/24/12-05/15/12    lllung 35Gy/14dx    ALLERGIES:  is allergic to codeine and penicillins.  MEDICATIONS:  Current Outpatient Prescriptions  Medication Sig Dispense Refill  . albuterol (PROVENTIL) (5 MG/ML) 0.5% nebulizer solution Take 0.5 mLs (2.5 mg total) by nebulization every 4 (four) hours as needed for wheezing.  20 mL  3  . ALPRAZolam (XANAX) 0.5 MG tablet Take 1 tablet (0.5 mg total) by mouth 3 (three) times daily as needed for anxiety.  60 tablet  0  . aspirin 81 MG chewable tablet Chew 1 tablet (81 mg total) by mouth daily.      Marland Kitchen dexamethasone (DECADRON) 4 MG tablet Take 1 tablet (4 mg total) by mouth as directed. 2 tablets BID day before, day of, day after chemotherapy.  40 tablet  1  . glimepiride (AMARYL) 4 MG tablet 2 tabs daily with breakfast      . HYDROcodone-acetaminophen (NORCO/VICODIN) 5-325 MG per tablet Take 1-2 tablets by mouth every 4 (four) hours as needed.  60 tablet  0  . ibuprofen (ADVIL,MOTRIN) 400 MG tablet Take 400 mg by mouth every 8 (eight) hours as needed for pain.      Marland Kitchen lactose free nutrition (BOOST PLUS) LIQD  Take 237 mLs by mouth 2 (two) times daily between meals.      . latanoprost (XALATAN) 0.005 % ophthalmic solution Place 1 drop into both eyes at bedtime.       Marland Kitchen loratadine (CLARITIN) 10 MG tablet Take 10 mg by mouth as needed.       . lovastatin (MEVACOR) 20 MG tablet Take 20 mg by mouth at bedtime.      . ondansetron (ZOFRAN) 8 MG tablet Take 1 tablet (8 mg total) by mouth every 8 (eight) hours as needed (as needed for nausea and vomiting).  30 tablet  1  . pioglitazone (ACTOS) 30 MG tablet Take 30 mg by mouth daily.       Marland Kitchen Respiratory Therapy Supplies (NEBULIZER/ADULT MASK) KIT Use as directed.  1 each   0  . sitaGLIPtin (JANUVIA) 100 MG tablet Take 100 mg by mouth daily.      Marland Kitchen tiotropium (SPIRIVA HANDIHALER) 18 MCG inhalation capsule Place 1 capsule (18 mcg total) into inhaler and inhale daily.  30 capsule  6  . NON FORMULARY Pt is currently receiving CHemotherapy      . prochlorperazine (COMPAZINE) 10 MG tablet Take 1 tablet (10 mg total) by mouth every 6 (six) hours as needed.  30 tablet  0   No current facility-administered medications for this visit.   Facility-Administered Medications Ordered in Other Visits  Medication Dose Route Frequency Provider Last Rate Last Dose  . sodium chloride 0.9 % injection 10 mL  10 mL Intracatheter PRN Si Gaul, MD   10 mL at 11/24/11 1637    SURGICAL HISTORY:  Past Surgical History  Procedure Laterality Date  . Tonsillectomy    . Gastrostomy tube placement  2011    has been removed  . Portacath placement  08/2010    REVIEW OF SYSTEMS:  A comprehensive review of systems was negative except for: Constitutional: positive for fatigue Respiratory: positive for dyspnea on exertion   PHYSICAL EXAMINATION: General appearance: alert, cooperative and no distress Head: Normocephalic, without obvious abnormality, atraumatic Neck: no adenopathy Lymph nodes: Cervical, supraclavicular, and axillary nodes normal. Resp: clear to auscultation bilaterally Cardio: regular rate and rhythm, S1, S2 normal, no murmur, click, rub or gallop GI: soft, non-tender; bowel sounds normal; no masses,  no organomegaly Extremities: extremities normal, atraumatic, no cyanosis or edema  ECOG PERFORMANCE STATUS: 2 - Symptomatic, <50% confined to bed  Blood pressure 97/58, pulse 101, temperature 96.6 F (35.9 C), temperature source Oral, resp. rate 20, height 5\' 10"  (1.778 m), weight 178 lb 1.6 oz (80.786 kg).  LABORATORY DATA: Lab Results  Component Value Date   WBC 12.2* 08/08/2012   HGB 9.1* 08/08/2012   HCT 29.1* 08/08/2012   MCV 92.4 08/08/2012   PLT 208  08/08/2012      Chemistry      Component Value Date/Time   NA 133* 07/22/2012 0500   NA 133* 07/10/2012 0834   NA 134 10/19/2011 0817   K 3.7 07/22/2012 0500   K 4.8 07/10/2012 0834   K 4.5 10/19/2011 0817   CL 94* 07/22/2012 0500   CL 99 07/10/2012 0834   CL 99 10/19/2011 0817   CO2 30 07/22/2012 0500   CO2 21* 07/10/2012 0834   CO2 27 10/19/2011 0817   BUN 19 07/22/2012 0500   BUN 39.6* 07/10/2012 0834   BUN 13 10/19/2011 0817   CREATININE 1.24 07/22/2012 0500   CREATININE 1.6* 07/10/2012 0834   CREATININE 1.4* 10/19/2011 1610  Component Value Date/Time   CALCIUM 8.2* 07/22/2012 0500   CALCIUM 9.2 07/10/2012 0834   CALCIUM 9.3 10/19/2011 0817   ALKPHOS 77 07/12/2012 0520   ALKPHOS 81 07/10/2012 0834   ALKPHOS 48 10/19/2011 0817   AST 12 07/12/2012 0520   AST 11 07/10/2012 0834   AST 19 10/19/2011 0817   ALT 13 07/12/2012 0520   ALT 17 07/10/2012 0834   BILITOT 0.6 07/12/2012 0520   BILITOT 1.19 07/10/2012 0834   BILITOT 0.90 10/19/2011 0817       RADIOGRAPHIC STUDIES: Dg Chest Portable 1 View  07/11/2012   *RADIOLOGY REPORT*  Clinical Data: Weakness.  History of lung cancer.  Diabetes.  COPD.  PORTABLE CHEST - 1 VIEW  Comparison: 06/06/2012.  Findings: Left lower lobe collapse / consolidation associated with mass lesion.  Allowing for differences in technique, this appears little changed compared to 06/06/2012.  The right lung appears clear.  The right IJ Port-A-Cath. Monitoring leads are projected over the chest. No pneumothorax.  Bilateral AC joint osteoarthritis.  IMPRESSION:  1.  No definite acute abnormality. 2.  Mass effect and consolidation at the left lung base appears similar to prior CT when allowing for differences in technique.  No definite superimposed acute abnormality.  Postobstructive pneumonia associated with left lower lobe mass merits consideration if the patient has an elevated white blood cell count.   Original Report Authenticated By: Andreas Newport, M.D.    ASSESSMENT: This is a very  pleasant 77 years old white male with metastatic non-small cell lung cancer, squamous cell carcinoma. The patient is here today to start the first cycle of weekly reduced dose docetaxel.   PLAN: We will proceed with treatment as planned. The patient would come back for followup visit in 2 weeks for reevaluation and management any adverse effects of his chemotherapy. He was advised to call immediately if he has any concerning symptoms in the interval  All questions were answered. The patient knows to call the clinic with any problems, questions or concerns. We can certainly see the patient much sooner if necessary.

## 2012-08-14 ENCOUNTER — Other Ambulatory Visit (HOSPITAL_BASED_OUTPATIENT_CLINIC_OR_DEPARTMENT_OTHER): Payer: Medicare Other | Admitting: Lab

## 2012-08-14 ENCOUNTER — Ambulatory Visit (HOSPITAL_BASED_OUTPATIENT_CLINIC_OR_DEPARTMENT_OTHER): Payer: Medicare Other

## 2012-08-14 DIAGNOSIS — C3430 Malignant neoplasm of lower lobe, unspecified bronchus or lung: Secondary | ICD-10-CM

## 2012-08-14 DIAGNOSIS — Z5111 Encounter for antineoplastic chemotherapy: Secondary | ICD-10-CM

## 2012-08-14 LAB — CBC WITH DIFFERENTIAL/PLATELET
Eosinophils Absolute: 0.3 10*3/uL (ref 0.0–0.5)
HCT: 28.6 % — ABNORMAL LOW (ref 38.4–49.9)
LYMPH%: 13.9 % — ABNORMAL LOW (ref 14.0–49.0)
MONO#: 0.5 10*3/uL (ref 0.1–0.9)
NEUT#: 5.9 10*3/uL (ref 1.5–6.5)
NEUT%: 75.6 % — ABNORMAL HIGH (ref 39.0–75.0)
Platelets: 210 10*3/uL (ref 140–400)
RBC: 3.16 10*6/uL — ABNORMAL LOW (ref 4.20–5.82)
WBC: 7.8 10*3/uL (ref 4.0–10.3)
lymph#: 1.1 10*3/uL (ref 0.9–3.3)
nRBC: 0 % (ref 0–0)

## 2012-08-14 LAB — COMPREHENSIVE METABOLIC PANEL (CC13)
ALT: 14 U/L (ref 0–55)
Albumin: 2.5 g/dL — ABNORMAL LOW (ref 3.5–5.0)
CO2: 27 mEq/L (ref 22–29)
Calcium: 8.8 mg/dL (ref 8.4–10.4)
Chloride: 102 mEq/L (ref 98–107)
Creatinine: 0.8 mg/dL (ref 0.7–1.3)
Potassium: 3.8 mEq/L (ref 3.5–5.1)

## 2012-08-14 LAB — TECHNOLOGIST REVIEW

## 2012-08-14 MED ORDER — ONDANSETRON 8 MG/50ML IVPB (CHCC)
8.0000 mg | Freq: Once | INTRAVENOUS | Status: AC
  Administered 2012-08-14: 8 mg via INTRAVENOUS

## 2012-08-14 MED ORDER — DEXAMETHASONE SODIUM PHOSPHATE 10 MG/ML IJ SOLN
10.0000 mg | Freq: Once | INTRAMUSCULAR | Status: AC
  Administered 2012-08-14: 10 mg via INTRAVENOUS

## 2012-08-14 MED ORDER — SODIUM CHLORIDE 0.9 % IV SOLN
25.0000 mg/m2 | Freq: Once | INTRAVENOUS | Status: AC
  Administered 2012-08-14: 50 mg via INTRAVENOUS
  Filled 2012-08-14: qty 5

## 2012-08-14 MED ORDER — SODIUM CHLORIDE 0.9 % IJ SOLN
10.0000 mL | INTRAMUSCULAR | Status: DC | PRN
  Administered 2012-08-14: 10 mL
  Filled 2012-08-14: qty 10

## 2012-08-14 MED ORDER — HEPARIN SOD (PORK) LOCK FLUSH 100 UNIT/ML IV SOLN
500.0000 [IU] | Freq: Once | INTRAVENOUS | Status: AC | PRN
  Administered 2012-08-14: 500 [IU]
  Filled 2012-08-14: qty 5

## 2012-08-14 MED ORDER — SODIUM CHLORIDE 0.9 % IV SOLN
Freq: Once | INTRAVENOUS | Status: AC
  Administered 2012-08-14: 11:00:00 via INTRAVENOUS

## 2012-08-14 NOTE — Patient Instructions (Addendum)
[facility] Discharge Instructions for Patients Receiving Chemotherapy  Today you received the following chemotherapy agents: taxotere  To help prevent nausea and vomiting after your treatment, we encourage you to take your nausea medication.  Take it as often as prescribed.     If you develop nausea and vomiting that is not controlled by your nausea medication, call the clinic. If it is after clinic hours your family physician or the after hours number for the clinic or go to the Emergency Department.   BELOW ARE SYMPTOMS THAT SHOULD BE REPORTED IMMEDIATELY:  *FEVER GREATER THAN 100.5 F  *CHILLS WITH OR WITHOUT FEVER  NAUSEA AND VOMITING THAT IS NOT CONTROLLED WITH YOUR NAUSEA MEDICATION  *UNUSUAL SHORTNESS OF BREATH  *UNUSUAL BRUISING OR BLEEDING  TENDERNESS IN MOUTH AND THROAT WITH OR WITHOUT PRESENCE OF ULCERS  *URINARY PROBLEMS  *BOWEL PROBLEMS  UNUSUAL RASH Items with * indicate a potential emergency and should be followed up as soon as possible.  Feel free to call the clinic you have any questions or concerns. The clinic phone number is [phone].   I have been informed and understand all the instructions given to me. I know to contact the clinic, my physician, or go to the Emergency Department if any problems should occur. I do not have any questions at this time, but understand that I may call the clinic during office hours   should I have any questions or need assistance in obtaining follow up care.    __________________________________________  _____________  __________ Signature of Patient or Authorized Representative            Date                   Time    __________________________________________ Nurse's Signature    

## 2012-08-21 ENCOUNTER — Ambulatory Visit (HOSPITAL_BASED_OUTPATIENT_CLINIC_OR_DEPARTMENT_OTHER): Payer: Medicare Other

## 2012-08-21 ENCOUNTER — Ambulatory Visit (HOSPITAL_BASED_OUTPATIENT_CLINIC_OR_DEPARTMENT_OTHER): Payer: Medicare Other | Admitting: Internal Medicine

## 2012-08-21 ENCOUNTER — Telehealth: Payer: Self-pay | Admitting: *Deleted

## 2012-08-21 ENCOUNTER — Other Ambulatory Visit (HOSPITAL_BASED_OUTPATIENT_CLINIC_OR_DEPARTMENT_OTHER): Payer: Medicare Other

## 2012-08-21 ENCOUNTER — Other Ambulatory Visit: Payer: Medicare Other | Admitting: Lab

## 2012-08-21 ENCOUNTER — Encounter: Payer: Self-pay | Admitting: Internal Medicine

## 2012-08-21 DIAGNOSIS — C3430 Malignant neoplasm of lower lobe, unspecified bronchus or lung: Secondary | ICD-10-CM

## 2012-08-21 DIAGNOSIS — C3490 Malignant neoplasm of unspecified part of unspecified bronchus or lung: Secondary | ICD-10-CM

## 2012-08-21 DIAGNOSIS — R0602 Shortness of breath: Secondary | ICD-10-CM

## 2012-08-21 DIAGNOSIS — Z5111 Encounter for antineoplastic chemotherapy: Secondary | ICD-10-CM

## 2012-08-21 LAB — CBC WITH DIFFERENTIAL/PLATELET
BASO%: 1 % (ref 0.0–2.0)
Basophils Absolute: 0.1 10*3/uL (ref 0.0–0.1)
EOS%: 2.4 % (ref 0.0–7.0)
HCT: 27.2 % — ABNORMAL LOW (ref 38.4–49.9)
HGB: 8.8 g/dL — ABNORMAL LOW (ref 13.0–17.1)
MCHC: 32.4 g/dL (ref 32.0–36.0)
MONO#: 0.5 10*3/uL (ref 0.1–0.9)
NEUT%: 76.5 % — ABNORMAL HIGH (ref 39.0–75.0)
RDW: 19.7 % — ABNORMAL HIGH (ref 11.0–14.6)
WBC: 6.2 10*3/uL (ref 4.0–10.3)
lymph#: 0.7 10*3/uL — ABNORMAL LOW (ref 0.9–3.3)

## 2012-08-21 LAB — COMPREHENSIVE METABOLIC PANEL (CC13)
AST: 10 U/L (ref 5–34)
Albumin: 2.8 g/dL — ABNORMAL LOW (ref 3.5–5.0)
Alkaline Phosphatase: 68 U/L (ref 40–150)
BUN: 17.7 mg/dL (ref 7.0–26.0)
Calcium: 8.9 mg/dL (ref 8.4–10.4)
Chloride: 103 mEq/L (ref 98–107)
Potassium: 4.3 mEq/L (ref 3.5–5.1)
Sodium: 137 mEq/L (ref 136–145)
Total Protein: 6.7 g/dL (ref 6.4–8.3)

## 2012-08-21 MED ORDER — DEXAMETHASONE SODIUM PHOSPHATE 10 MG/ML IJ SOLN
10.0000 mg | Freq: Once | INTRAMUSCULAR | Status: AC
  Administered 2012-08-21: 10 mg via INTRAVENOUS

## 2012-08-21 MED ORDER — SODIUM CHLORIDE 0.9 % IV SOLN
25.0000 mg/m2 | Freq: Once | INTRAVENOUS | Status: AC
  Administered 2012-08-21: 50 mg via INTRAVENOUS
  Filled 2012-08-21: qty 5

## 2012-08-21 MED ORDER — ONDANSETRON 8 MG/50ML IVPB (CHCC)
8.0000 mg | Freq: Once | INTRAVENOUS | Status: AC
Start: 2012-08-21 — End: 2012-08-21
  Administered 2012-08-21: 8 mg via INTRAVENOUS

## 2012-08-21 NOTE — Telephone Encounter (Signed)
Per staff message and POF I have scheduled appts.  JMW  

## 2012-08-21 NOTE — Patient Instructions (Signed)
Continue chemotherapy as scheduled.  Followup visit in 3 weeks. 

## 2012-08-21 NOTE — Progress Notes (Signed)
[facility] [phone]   [phone]  OFFICE PROGRESS NOTE  [name], MD [address] [place] [address]  PRINCIPAL DIAGNOSIS: Stage IIIB/IV non-small cell lung cancer, squamous cell carcinoma diagnosed in June 2012.   PRIOR THERAPY:  1) Status post a course of concurrent chemoradiation with weekly carboplatin and paclitaxel. Last dose of chemotherapy was given on November 01, 2010.  2) Systemic chemotherapy with carboplatin for an AUC of 5 given on day 1 and gemcitabine 1000 mg per meter square given on days 1 and 8 every 3 weeks status post 1 cycle. However due to thrombocytopenia from cycle 2 forward he will proceed with carboplatin for an AUC of 5 given on day 1 and gemcitabine at 800 mg per meter squared given on days 1 and 8 every 3 weeks. Status post 6 cycles with evidence for disease progression after cycle #6.  3) Palliative radiotherapy to the enlarging left lower lobe lung mass under the care of Dr. Kathrynn Running.  4) Systemic chemotherapy with docetaxel 75 mg/M2 every 3 weeks with Neulasta support, status post 2 cycles, first cycle was started on 06/12/2012. That was discontinued today secondary to intolerance.   CURRENT THERAPY: Systemic chemotherapy with docetaxel 25 mg/M2 every week, first cycle is expected on 08/08/2012. Status post 2 doses.  INTERVAL HISTORY: [name] 77 y.o. male returns to the clinic today for follow up visit accompanied by his wife. The patient is feeling fine today with no specific complaints. He denied having any significant chest pain but continues to have shortness breath with exertion. He has more energy and able to do a little bit more these days, but still has mild fatigue. He denied having any significant chest pain, cough or hemoptysis. He tolerated the last 2 doses of weekly docetaxel fairly well with no significant adverse effects. He denied having any nausea or vomiting, no fever or  chills, no peripheral neuropathy.  MEDICAL HISTORY: Past Medical History  Diagnosis Date  . Lung cancer 2012    recurrence 12/2011  . Diabetes mellitus   . HTN (hypertension)   . High cholesterol   . Hx of radiation therapy 09/22/10 - 11/05/10    LLL lung  . History of chemotherapy   . History of radiation therapy 04/24/12-05/15/12    lllung 35Gy/14dx    ALLERGIES:  is allergic to codeine and penicillins.  MEDICATIONS:  Current Outpatient Prescriptions  Medication Sig Dispense Refill  . albuterol (PROVENTIL) (5 MG/ML) 0.5% nebulizer solution Take 0.5 mLs (2.5 mg total) by nebulization every 4 (four) hours as needed for wheezing.  20 mL  3  . ALPRAZolam (XANAX) 0.5 MG tablet Take 1 tablet (0.5 mg total) by mouth 3 (three) times daily as needed for anxiety.  60 tablet  0  . aspirin 81 MG chewable tablet Chew 1 tablet (81 mg total) by mouth daily.      Marland Kitchen dexamethasone (DECADRON) 4 MG tablet Take 1 tablet (4 mg total) by mouth as directed. 2 tablets BID day before, day of, day after chemotherapy.  40 tablet  1  . glimepiride (AMARYL) 4 MG tablet 2 tabs daily with breakfast      . ibuprofen (ADVIL,MOTRIN) 400 MG tablet Take 400 mg by mouth every 8 (eight) hours as needed for pain.      Marland Kitchen lactose free nutrition (BOOST PLUS) LIQD Take 237 mLs by mouth 2 (two) times daily between meals.      Marland Kitchen  latanoprost (XALATAN) 0.005 % ophthalmic solution Place 1 drop into both eyes at bedtime.       Marland Kitchen loratadine (CLARITIN) 10 MG tablet Take 10 mg by mouth as needed.       . lovastatin (MEVACOR) 20 MG tablet Take 20 mg by mouth at bedtime.      . NON FORMULARY Pt is currently receiving CHemotherapy      . pioglitazone (ACTOS) 30 MG tablet Take 30 mg by mouth daily.       Marland Kitchen Respiratory Therapy Supplies (NEBULIZER/ADULT MASK) KIT Use as directed.  1 each  0  . sitaGLIPtin (JANUVIA) 100 MG tablet Take 100 mg by mouth daily.      Marland Kitchen tiotropium (SPIRIVA HANDIHALER) 18 MCG inhalation capsule Place 1 capsule (18  mcg total) into inhaler and inhale daily.  30 capsule  6  . HYDROcodone-acetaminophen (NORCO/VICODIN) 5-325 MG per tablet Take 1-2 tablets by mouth every 4 (four) hours as needed.  60 tablet  0  . ondansetron (ZOFRAN) 8 MG tablet Take 1 tablet (8 mg total) by mouth every 8 (eight) hours as needed (as needed for nausea and vomiting).  30 tablet  1  . prochlorperazine (COMPAZINE) 10 MG tablet Take 1 tablet (10 mg total) by mouth every 6 (six) hours as needed.  30 tablet  0   No current facility-administered medications for this visit.   Facility-Administered Medications Ordered in Other Visits  Medication Dose Route Frequency Provider Last Rate Last Dose  . sodium chloride 0.9 % injection 10 mL  10 mL Intracatheter PRN Si Gaul, MD   10 mL at 11/24/11 1637    SURGICAL HISTORY:  Past Surgical History  Procedure Laterality Date  . Tonsillectomy    . Gastrostomy tube placement  2011    has been removed  . Portacath placement  08/2010    REVIEW OF SYSTEMS:  A comprehensive review of systems was negative except for: Constitutional: positive for fatigue Respiratory: positive for dyspnea on exertion   PHYSICAL EXAMINATION: General appearance: alert, cooperative, fatigued and no distress Head: Normocephalic, without obvious abnormality, atraumatic Neck: no adenopathy Lymph nodes: Cervical, supraclavicular, and axillary nodes normal. Resp: clear to auscultation bilaterally Cardio: regular rate and rhythm, S1, S2 normal, no murmur, click, rub or gallop GI: soft, non-tender; bowel sounds normal; no masses,  no organomegaly Extremities: extremities normal, atraumatic, no cyanosis or edema  ECOG PERFORMANCE STATUS: 1 - Symptomatic but completely ambulatory  Blood pressure 99/61, pulse 97, temperature 97 F (36.1 C), temperature source Oral, resp. rate 17, height 5\' 10"  (1.778 m), weight 179 lb 9.6 oz (81.466 kg).  LABORATORY DATA: Lab Results  Component Value Date   WBC 6.2 08/21/2012     HGB 8.8* 08/21/2012   HCT 27.2* 08/21/2012   MCV 90.7 08/21/2012   PLT 178 08/21/2012      Chemistry      Component Value Date/Time   NA 138 08/14/2012 1022   NA 133* 07/22/2012 0500   NA 134 10/19/2011 0817   K 3.8 08/14/2012 1022   K 3.7 07/22/2012 0500   K 4.5 10/19/2011 0817   CL 102 08/14/2012 1022   CL 94* 07/22/2012 0500   CL 99 10/19/2011 0817   CO2 27 08/14/2012 1022   CO2 30 07/22/2012 0500   CO2 27 10/19/2011 0817   BUN 19.1 08/14/2012 1022   BUN 19 07/22/2012 0500   BUN 13 10/19/2011 0817   CREATININE 0.8 08/14/2012 1022   CREATININE 1.24 07/22/2012 0500  CREATININE 1.4* 10/19/2011 0817      Component Value Date/Time   CALCIUM 8.8 08/14/2012 1022   CALCIUM 8.2* 07/22/2012 0500   CALCIUM 9.3 10/19/2011 0817   ALKPHOS 79 08/14/2012 1022   ALKPHOS 77 07/12/2012 0520   ALKPHOS 48 10/19/2011 0817   AST 12 08/14/2012 1022   AST 12 07/12/2012 0520   AST 19 10/19/2011 0817   ALT 14 08/14/2012 1022   ALT 13 07/12/2012 0520   BILITOT 0.61 08/14/2012 1022   BILITOT 0.6 07/12/2012 0520   BILITOT 0.90 10/19/2011 0817       RADIOGRAPHIC STUDIES: No results found.  ASSESSMENT: this is a very pleasant 77 years old white male with progressive non-small cell lung cancer, squamous cell carcinoma currently on treatment with weekly reduced dose docetaxel and tolerating it fairly well.   PLAN: we'll proceed with cycle #3 today as scheduled. The patient would come back for follow up visit in 3 weeks for reevaluation and management any adverse effect of his chemotherapy. He was advised to call immediately if he has any concerning symptoms in the interval.  All questions were answered. The patient knows to call the clinic with any problems, questions or concerns. We can certainly see the patient much sooner if necessary.

## 2012-08-21 NOTE — Patient Instructions (Addendum)
[facility] Discharge Instructions for Patients Receiving Chemotherapy  Today you received the following chemotherapy agents Taxotere.  To help prevent nausea and vomiting after your treatment, we encourage you to take your nausea medication.   If you develop nausea and vomiting that is not controlled by your nausea medication, call the clinic.   BELOW ARE SYMPTOMS THAT SHOULD BE REPORTED IMMEDIATELY:  *FEVER GREATER THAN 100.5 F  *CHILLS WITH OR WITHOUT FEVER  NAUSEA AND VOMITING THAT IS NOT CONTROLLED WITH YOUR NAUSEA MEDICATION  *UNUSUAL SHORTNESS OF BREATH  *UNUSUAL BRUISING OR BLEEDING  TENDERNESS IN MOUTH AND THROAT WITH OR WITHOUT PRESENCE OF ULCERS  *URINARY PROBLEMS  *BOWEL PROBLEMS  UNUSUAL RASH Items with * indicate a potential emergency and should be followed up as soon as possible.  Feel free to call the clinic you have any questions or concerns. The clinic phone number is [phone].    

## 2012-08-28 ENCOUNTER — Other Ambulatory Visit (HOSPITAL_BASED_OUTPATIENT_CLINIC_OR_DEPARTMENT_OTHER): Payer: Medicare Other | Admitting: Lab

## 2012-08-28 ENCOUNTER — Ambulatory Visit: Payer: Medicare Other | Admitting: Nutrition

## 2012-08-28 ENCOUNTER — Ambulatory Visit (HOSPITAL_BASED_OUTPATIENT_CLINIC_OR_DEPARTMENT_OTHER): Payer: Medicare Other

## 2012-08-28 ENCOUNTER — Other Ambulatory Visit: Payer: Self-pay | Admitting: Internal Medicine

## 2012-08-28 DIAGNOSIS — C3430 Malignant neoplasm of lower lobe, unspecified bronchus or lung: Secondary | ICD-10-CM

## 2012-08-28 DIAGNOSIS — Z5111 Encounter for antineoplastic chemotherapy: Secondary | ICD-10-CM

## 2012-08-28 LAB — CBC WITH DIFFERENTIAL/PLATELET
BASO%: 0.4 % (ref 0.0–2.0)
EOS%: 1.3 % (ref 0.0–7.0)
HGB: 9.6 g/dL — ABNORMAL LOW (ref 13.0–17.1)
MCH: 29.9 pg (ref 27.2–33.4)
MCHC: 32.9 g/dL (ref 32.0–36.0)
MCV: 91 fL (ref 79.3–98.0)
MONO%: 9.3 % (ref 0.0–14.0)
RBC: 3.21 10*6/uL — ABNORMAL LOW (ref 4.20–5.82)
RDW: 19.7 % — ABNORMAL HIGH (ref 11.0–14.6)
lymph#: 1 10*3/uL (ref 0.9–3.3)
nRBC: 0 % (ref 0–0)

## 2012-08-28 LAB — COMPREHENSIVE METABOLIC PANEL (CC13)
ALT: 10 U/L (ref 0–55)
AST: 11 U/L (ref 5–34)
Albumin: 2.8 g/dL — ABNORMAL LOW (ref 3.5–5.0)
Alkaline Phosphatase: 70 U/L (ref 40–150)
Glucose: 163 mg/dl — ABNORMAL HIGH (ref 70–99)
Potassium: 3.4 mEq/L — ABNORMAL LOW (ref 3.5–5.1)
Sodium: 138 mEq/L (ref 136–145)
Total Bilirubin: 0.76 mg/dL (ref 0.20–1.20)
Total Protein: 6.5 g/dL (ref 6.4–8.3)

## 2012-08-28 MED ORDER — DEXAMETHASONE SODIUM PHOSPHATE 10 MG/ML IJ SOLN
10.0000 mg | Freq: Once | INTRAMUSCULAR | Status: AC
  Administered 2012-08-28: 10 mg via INTRAVENOUS

## 2012-08-28 MED ORDER — SODIUM CHLORIDE 0.9 % IV SOLN
Freq: Once | INTRAVENOUS | Status: AC
  Administered 2012-08-28: 11:00:00 via INTRAVENOUS

## 2012-08-28 MED ORDER — SODIUM CHLORIDE 0.9 % IV SOLN
25.0000 mg/m2 | Freq: Once | INTRAVENOUS | Status: AC
  Administered 2012-08-28: 50 mg via INTRAVENOUS
  Filled 2012-08-28: qty 5

## 2012-08-28 MED ORDER — SODIUM CHLORIDE 0.9 % IJ SOLN
10.0000 mL | INTRAMUSCULAR | Status: DC | PRN
  Administered 2012-08-28: 10 mL
  Filled 2012-08-28: qty 10

## 2012-08-28 MED ORDER — ONDANSETRON 8 MG/50ML IVPB (CHCC)
8.0000 mg | Freq: Once | INTRAVENOUS | Status: AC
  Administered 2012-08-28: 8 mg via INTRAVENOUS

## 2012-08-28 MED ORDER — HEPARIN SOD (PORK) LOCK FLUSH 100 UNIT/ML IV SOLN
500.0000 [IU] | Freq: Once | INTRAVENOUS | Status: AC | PRN
  Administered 2012-08-28: 500 [IU]
  Filled 2012-08-28: qty 5

## 2012-08-28 NOTE — Patient Instructions (Addendum)
[facility] Discharge Instructions for Patients Receiving Chemotherapy  Today you received the following chemotherapy agents: Taxotere  To help prevent nausea and vomiting after your treatment, we encourage you to take your nausea medication as prescribed.    If you develop nausea and vomiting that is not controlled by your nausea medication, call the clinic.   BELOW ARE SYMPTOMS THAT SHOULD BE REPORTED IMMEDIATELY:  *FEVER GREATER THAN 100.5 F  *CHILLS WITH OR WITHOUT FEVER  NAUSEA AND VOMITING THAT IS NOT CONTROLLED WITH YOUR NAUSEA MEDICATION  *UNUSUAL SHORTNESS OF BREATH  *UNUSUAL BRUISING OR BLEEDING  TENDERNESS IN MOUTH AND THROAT WITH OR WITHOUT PRESENCE OF ULCERS  *URINARY PROBLEMS  *BOWEL PROBLEMS  UNUSUAL RASH Items with * indicate a potential emergency and should be followed up as soon as possible.  Feel free to call the clinic you have any questions or concerns. The clinic phone number is [phone].    

## 2012-08-28 NOTE — Progress Notes (Signed)
Patient reports he is eating well. He has no complaints at this time. He denies nutrition impact symptoms. Weight is stable and was documented as 179.6 pounds on June 10 increased slightly from 178.1 pounds May 28.   Nutrition diagnosis: Inadequate oral intake continues to improve.    Intervention: I have educated patient and wife to continue small frequent meals with adequate calories and protein. He is to continue boost plus or Ensure Plus to supplement his oral intake. Teach back method used.  Monitoring, evaluation, goals: Patient is able to tolerate adequate calories and protein to preserve lean body mass and have improved quality-of-life.  Next visit: Tuesday, July 1, during chemotherapy.

## 2012-09-04 ENCOUNTER — Other Ambulatory Visit: Payer: Medicare Other | Admitting: Lab

## 2012-09-04 ENCOUNTER — Other Ambulatory Visit (HOSPITAL_BASED_OUTPATIENT_CLINIC_OR_DEPARTMENT_OTHER): Payer: Medicare Other | Admitting: Lab

## 2012-09-04 ENCOUNTER — Ambulatory Visit (HOSPITAL_BASED_OUTPATIENT_CLINIC_OR_DEPARTMENT_OTHER): Payer: Medicare Other

## 2012-09-04 DIAGNOSIS — C3430 Malignant neoplasm of lower lobe, unspecified bronchus or lung: Secondary | ICD-10-CM

## 2012-09-04 DIAGNOSIS — Z5111 Encounter for antineoplastic chemotherapy: Secondary | ICD-10-CM

## 2012-09-04 LAB — CBC WITH DIFFERENTIAL/PLATELET
BASO%: 0.1 % (ref 0.0–2.0)
EOS%: 0 % (ref 0.0–7.0)
Eosinophils Absolute: 0 10*3/uL (ref 0.0–0.5)
MCV: 91.5 fL (ref 79.3–98.0)
MONO%: 3.4 % (ref 0.0–14.0)
NEUT#: 8.2 10*3/uL — ABNORMAL HIGH (ref 1.5–6.5)
RBC: 3.19 10*6/uL — ABNORMAL LOW (ref 4.20–5.82)
RDW: 19.3 % — ABNORMAL HIGH (ref 11.0–14.6)

## 2012-09-04 LAB — COMPREHENSIVE METABOLIC PANEL (CC13)
ALT: 15 U/L (ref 0–55)
AST: 9 U/L (ref 5–34)
Albumin: 2.9 g/dL — ABNORMAL LOW (ref 3.5–5.0)
Alkaline Phosphatase: 67 U/L (ref 40–150)
Potassium: 4.1 mEq/L (ref 3.5–5.1)
Sodium: 137 mEq/L (ref 136–145)
Total Protein: 6.6 g/dL (ref 6.4–8.3)

## 2012-09-04 MED ORDER — SODIUM CHLORIDE 0.9 % IJ SOLN
10.0000 mL | INTRAMUSCULAR | Status: DC | PRN
  Administered 2012-09-04: 10 mL
  Filled 2012-09-04: qty 10

## 2012-09-04 MED ORDER — SODIUM CHLORIDE 0.9 % IV SOLN
25.0000 mg/m2 | Freq: Once | INTRAVENOUS | Status: AC
  Administered 2012-09-04: 50 mg via INTRAVENOUS
  Filled 2012-09-04: qty 5

## 2012-09-04 MED ORDER — ONDANSETRON 8 MG/50ML IVPB (CHCC)
8.0000 mg | Freq: Once | INTRAVENOUS | Status: AC
  Administered 2012-09-04: 8 mg via INTRAVENOUS

## 2012-09-04 MED ORDER — DEXAMETHASONE SODIUM PHOSPHATE 10 MG/ML IJ SOLN
10.0000 mg | Freq: Once | INTRAMUSCULAR | Status: AC
  Administered 2012-09-04: 10 mg via INTRAVENOUS

## 2012-09-04 MED ORDER — HEPARIN SOD (PORK) LOCK FLUSH 100 UNIT/ML IV SOLN
500.0000 [IU] | Freq: Once | INTRAVENOUS | Status: AC | PRN
  Administered 2012-09-04: 500 [IU]
  Filled 2012-09-04: qty 5

## 2012-09-04 MED ORDER — SODIUM CHLORIDE 0.9 % IV SOLN
Freq: Once | INTRAVENOUS | Status: AC
  Administered 2012-09-04: 11:00:00 via INTRAVENOUS

## 2012-09-11 ENCOUNTER — Ambulatory Visit (HOSPITAL_BASED_OUTPATIENT_CLINIC_OR_DEPARTMENT_OTHER): Payer: Medicare Other | Admitting: Internal Medicine

## 2012-09-11 ENCOUNTER — Telehealth: Payer: Self-pay | Admitting: *Deleted

## 2012-09-11 ENCOUNTER — Encounter: Payer: Self-pay | Admitting: Internal Medicine

## 2012-09-11 ENCOUNTER — Ambulatory Visit (HOSPITAL_BASED_OUTPATIENT_CLINIC_OR_DEPARTMENT_OTHER): Payer: Medicare Other

## 2012-09-11 ENCOUNTER — Ambulatory Visit: Payer: Medicare Other | Admitting: Nutrition

## 2012-09-11 ENCOUNTER — Telehealth: Payer: Self-pay | Admitting: Internal Medicine

## 2012-09-11 ENCOUNTER — Other Ambulatory Visit (HOSPITAL_BASED_OUTPATIENT_CLINIC_OR_DEPARTMENT_OTHER): Payer: Medicare Other | Admitting: Lab

## 2012-09-11 VITALS — BP 103/56 | HR 90 | Temp 97.5°F | Resp 17 | Ht 70.0 in | Wt 175.3 lb

## 2012-09-11 DIAGNOSIS — Z5111 Encounter for antineoplastic chemotherapy: Secondary | ICD-10-CM

## 2012-09-11 DIAGNOSIS — C3492 Malignant neoplasm of unspecified part of left bronchus or lung: Secondary | ICD-10-CM

## 2012-09-11 DIAGNOSIS — C3430 Malignant neoplasm of lower lobe, unspecified bronchus or lung: Secondary | ICD-10-CM

## 2012-09-11 LAB — COMPREHENSIVE METABOLIC PANEL (CC13)
ALT: 9 U/L (ref 0–55)
AST: 10 U/L (ref 5–34)
Albumin: 2.8 g/dL — ABNORMAL LOW (ref 3.5–5.0)
Alkaline Phosphatase: 61 U/L (ref 40–150)
BUN: 13.8 mg/dL (ref 7.0–26.0)
CO2: 26 meq/L (ref 22–29)
Calcium: 8.8 mg/dL (ref 8.4–10.4)
Chloride: 101 meq/L (ref 98–109)
Creatinine: 0.8 mg/dL (ref 0.7–1.3)
Glucose: 195 mg/dL — ABNORMAL HIGH (ref 70–140)
Potassium: 3.6 meq/L (ref 3.5–5.1)
Sodium: 136 meq/L (ref 136–145)
Total Bilirubin: 0.76 mg/dL (ref 0.20–1.20)
Total Protein: 6.1 g/dL — ABNORMAL LOW (ref 6.4–8.3)

## 2012-09-11 LAB — CBC WITH DIFFERENTIAL/PLATELET
EOS%: 0.9 % (ref 0.0–7.0)
Eosinophils Absolute: 0.1 10*3/uL (ref 0.0–0.5)
HGB: 10 g/dL — ABNORMAL LOW (ref 13.0–17.1)
MCH: 30.4 pg (ref 27.2–33.4)
MCV: 91.2 fL (ref 79.3–98.0)
MONO%: 9.3 % (ref 0.0–14.0)
NEUT#: 6.5 10*3/uL (ref 1.5–6.5)
RBC: 3.29 10*6/uL — ABNORMAL LOW (ref 4.20–5.82)
RDW: 19.1 % — ABNORMAL HIGH (ref 11.0–14.6)
lymph#: 1.3 10*3/uL (ref 0.9–3.3)
nRBC: 0 % (ref 0–0)

## 2012-09-11 MED ORDER — DEXAMETHASONE SODIUM PHOSPHATE 10 MG/ML IJ SOLN
10.0000 mg | Freq: Once | INTRAMUSCULAR | Status: AC
  Administered 2012-09-11: 10 mg via INTRAVENOUS

## 2012-09-11 MED ORDER — SODIUM CHLORIDE 0.9 % IV SOLN
Freq: Once | INTRAVENOUS | Status: AC
  Administered 2012-09-11: 11:00:00 via INTRAVENOUS

## 2012-09-11 MED ORDER — HEPARIN SOD (PORK) LOCK FLUSH 100 UNIT/ML IV SOLN
500.0000 [IU] | Freq: Once | INTRAVENOUS | Status: AC | PRN
  Administered 2012-09-11: 500 [IU]
  Filled 2012-09-11: qty 5

## 2012-09-11 MED ORDER — SODIUM CHLORIDE 0.9 % IJ SOLN
10.0000 mL | INTRAMUSCULAR | Status: DC | PRN
  Administered 2012-09-11: 10 mL
  Filled 2012-09-11: qty 10

## 2012-09-11 MED ORDER — ONDANSETRON 8 MG/50ML IVPB (CHCC)
8.0000 mg | Freq: Once | INTRAVENOUS | Status: AC
  Administered 2012-09-11: 8 mg via INTRAVENOUS

## 2012-09-11 MED ORDER — SODIUM CHLORIDE 0.9 % IV SOLN
25.0000 mg/m2 | Freq: Once | INTRAVENOUS | Status: AC
  Administered 2012-09-11: 50 mg via INTRAVENOUS
  Filled 2012-09-11: qty 5

## 2012-09-11 NOTE — Telephone Encounter (Signed)
Per staff phone call and POF I have schedueld appts.  JMW  

## 2012-09-11 NOTE — Progress Notes (Signed)
Patient reports he continues to feel well.  He states he is eating normally.  He denies nutrition impact symptoms.  Weight documented as 175.3 pounds July 1, which is decreased from 179.6 pounds June 10.  Nutrition diagnosis: Inadequate oral intake continues but has improved.  Intervention: I have reeducated patient to continue small frequent meals with adequate calories and protein.  He is to continue boost plus or ensure plus to supplement his oral intake.  Teach back method used.  Monitoring, evaluation, goals: Patient is tolerating his oral diet without difficulty but has had some continued weight loss.  Next visit: Tuesday, July 22, during chemotherapy.

## 2012-09-11 NOTE — Patient Instructions (Signed)
Continue chemotherapy today as scheduled.  Follow up visit in 2 weeks. 

## 2012-09-11 NOTE — Patient Instructions (Signed)
[facility] Discharge Instructions for Patients Receiving Chemotherapy  Today you received the following chemotherapy agents :  Taxotere.  To help prevent nausea and vomiting after your treatment, we encourage you to take your nausea medication as instructed by your physician.   If you develop nausea and vomiting that is not controlled by your nausea medication, call the clinic.   BELOW ARE SYMPTOMS THAT SHOULD BE REPORTED IMMEDIATELY:  *FEVER GREATER THAN 100.5 F  *CHILLS WITH OR WITHOUT FEVER  NAUSEA AND VOMITING THAT IS NOT CONTROLLED WITH YOUR NAUSEA MEDICATION  *UNUSUAL SHORTNESS OF BREATH  *UNUSUAL BRUISING OR BLEEDING  TENDERNESS IN MOUTH AND THROAT WITH OR WITHOUT PRESENCE OF ULCERS  *URINARY PROBLEMS  *BOWEL PROBLEMS  UNUSUAL RASH Items with * indicate a potential emergency and should be followed up as soon as possible.  Feel free to call the clinic you have any questions or concerns. The clinic phone number is [phone].    

## 2012-09-11 NOTE — Telephone Encounter (Signed)
Gave pt appt for lab, chemo and MD for July and August 2014 °

## 2012-09-11 NOTE — Progress Notes (Signed)
[facility] Telephone:([phone]   [phone]  OFFICE PROGRESS NOTE  [name], MD [address] [place] [address]  PRINCIPAL DIAGNOSIS: Stage IIIB/IV non-small cell lung cancer, squamous cell carcinoma diagnosed in June 2012.   PRIOR THERAPY:  1) Status post a course of concurrent chemoradiation with weekly carboplatin and paclitaxel. Last dose of chemotherapy was given on November 01, 2010.  2) Systemic chemotherapy with carboplatin for an AUC of 5 given on day 1 and gemcitabine 1000 mg per meter square given on days 1 and 8 every 3 weeks status post 1 cycle. However due to thrombocytopenia from cycle 2 forward he will proceed with carboplatin for an AUC of 5 given on day 1 and gemcitabine at 800 mg per meter squared given on days 1 and 8 every 3 weeks. Status post 6 cycles with evidence for disease progression after cycle #6.  3) Palliative radiotherapy to the enlarging left lower lobe lung mass under the care of Dr. Kathrynn Running.  4) Systemic chemotherapy with docetaxel 75 mg/M2 every 3 weeks with Neulasta support, status post 2 cycles, first cycle was started on 06/12/2012. That was discontinued today secondary to intolerance.   CURRENT THERAPY: Systemic chemotherapy with docetaxel 25 mg/M2 every week, first cycle is expected on 08/08/2012. Status post 5 doses.  INTERVAL HISTORY: [name] 77 y.o. male returns to the clinic today for followup visit accompanied his wife. The patient is feeling fine today except for mild fatigue. He is tolerating his current systemic chemotherapy with weekly docetaxel fairly well with no significant adverse effects. He denied having any significant nausea or vomiting. He denied having any weight loss or night sweats. He has no fever or chills. He denied having any significant peripheral neuropathy.  MEDICAL HISTORY: Past Medical History  Diagnosis Date  . Lung cancer 2012    recurrence 12/2011  .  Diabetes mellitus   . HTN (hypertension)   . High cholesterol   . Hx of radiation therapy 09/22/10 - 11/05/10    LLL lung  . History of chemotherapy   . History of radiation therapy 04/24/12-05/15/12    lllung 35Gy/14dx    ALLERGIES:  is allergic to codeine and penicillins.  MEDICATIONS:  Current Outpatient Prescriptions  Medication Sig Dispense Refill  . albuterol (PROVENTIL) (5 MG/ML) 0.5% nebulizer solution Take 0.5 mLs (2.5 mg total) by nebulization every 4 (four) hours as needed for wheezing.  20 mL  3  . ALPRAZolam (XANAX) 0.5 MG tablet Take 1 tablet (0.5 mg total) by mouth 3 (three) times daily as needed for anxiety.  60 tablet  0  . aspirin 81 MG chewable tablet Chew 1 tablet (81 mg total) by mouth daily.      Marland Kitchen dexamethasone (DECADRON) 4 MG tablet Take 1 tablet (4 mg total) by mouth as directed. 2 tablets BID day before, day of, day after chemotherapy.  40 tablet  1  . glimepiride (AMARYL) 4 MG tablet 2 tabs daily with breakfast      . HYDROcodone-acetaminophen (NORCO/VICODIN) 5-325 MG per tablet Take 1-2 tablets by mouth every 4 (four) hours as needed.  60 tablet  0  . ibuprofen (ADVIL,MOTRIN) 400 MG tablet Take 400 mg by mouth every 8 (eight) hours as needed for pain.      Marland Kitchen lactose free nutrition (BOOST PLUS) LIQD Take 237 mLs by mouth 2 (two) times daily between meals.      . latanoprost (XALATAN) 0.005 % ophthalmic  solution Place 1 drop into both eyes at bedtime.       Marland Kitchen loratadine (CLARITIN) 10 MG tablet Take 10 mg by mouth as needed.       . lovastatin (MEVACOR) 20 MG tablet Take 20 mg by mouth at bedtime.      . NON FORMULARY Pt is currently receiving CHemotherapy      . ondansetron (ZOFRAN) 8 MG tablet Take 1 tablet (8 mg total) by mouth every 8 (eight) hours as needed (as needed for nausea and vomiting).  30 tablet  1  . pioglitazone (ACTOS) 30 MG tablet Take 30 mg by mouth daily.       . prochlorperazine (COMPAZINE) 10 MG tablet Take 1 tablet (10 mg total) by mouth every 6  (six) hours as needed.  30 tablet  0  . Respiratory Therapy Supplies (NEBULIZER/ADULT MASK) KIT Use as directed.  1 each  0  . sitaGLIPtin (JANUVIA) 100 MG tablet Take 100 mg by mouth daily.      Marland Kitchen tiotropium (SPIRIVA HANDIHALER) 18 MCG inhalation capsule Place 1 capsule (18 mcg total) into inhaler and inhale daily.  30 capsule  6  . nystatin (MYCOSTATIN) 100000 UNIT/ML suspension Take 10,000 Units by mouth 4 (four) times daily.       No current facility-administered medications for this visit.   Facility-Administered Medications Ordered in Other Visits  Medication Dose Route Frequency Provider Last Rate Last Dose  . sodium chloride 0.9 % injection 10 mL  10 mL Intracatheter PRN Si Gaul, MD   10 mL at 11/24/11 1637    SURGICAL HISTORY:  Past Surgical History  Procedure Laterality Date  . Tonsillectomy    . Gastrostomy tube placement  2011    has been removed  . Portacath placement  08/2010    REVIEW OF SYSTEMS:  A comprehensive review of systems was negative except for: Constitutional: positive for fatigue   PHYSICAL EXAMINATION: General appearance: alert, cooperative, fatigued and no distress Head: Normocephalic, without obvious abnormality, atraumatic Neck: no adenopathy Lymph nodes: Cervical, supraclavicular, and axillary nodes normal. Resp: clear to auscultation bilaterally Cardio: regular rate and rhythm, S1, S2 normal, no murmur, click, rub or gallop GI: soft, non-tender; bowel sounds normal; no masses,  no organomegaly Extremities: extremities normal, atraumatic, no cyanosis or edema  ECOG PERFORMANCE STATUS: 1 - Symptomatic but completely ambulatory  Blood pressure 103/56, pulse 90, temperature 97.5 F (36.4 C), temperature source Oral, resp. rate 17, height 5\' 10"  (1.778 m), weight 175 lb 4.8 oz (79.516 kg), SpO2 100.00%.  LABORATORY DATA: Lab Results  Component Value Date   WBC 8.7 09/11/2012   HGB 10.0* 09/11/2012   HCT 30.0* 09/11/2012   MCV 91.2 09/11/2012    PLT 195 09/11/2012      Chemistry      Component Value Date/Time   NA 137 09/04/2012 1017   NA 133* 07/22/2012 0500   NA 134 10/19/2011 0817   K 4.1 09/04/2012 1017   K 3.7 07/22/2012 0500   K 4.5 10/19/2011 0817   CL 102 09/04/2012 1017   CL 94* 07/22/2012 0500   CL 99 10/19/2011 0817   CO2 23 09/04/2012 1017   CO2 30 07/22/2012 0500   CO2 27 10/19/2011 0817   BUN 26.5* 09/04/2012 1017   BUN 19 07/22/2012 0500   BUN 13 10/19/2011 0817   CREATININE 0.9 09/04/2012 1017   CREATININE 1.24 07/22/2012 0500   CREATININE 1.4* 10/19/2011 0817      Component Value Date/Time  CALCIUM 9.2 09/04/2012 1017   CALCIUM 8.2* 07/22/2012 0500   CALCIUM 9.3 10/19/2011 0817   ALKPHOS 67 09/04/2012 1017   ALKPHOS 77 07/12/2012 0520   ALKPHOS 48 10/19/2011 0817   AST 9 09/04/2012 1017   AST 12 07/12/2012 0520   AST 19 10/19/2011 0817   ALT 15 09/04/2012 1017   ALT 13 07/12/2012 0520   BILITOT 0.79 09/04/2012 1017   BILITOT 0.6 07/12/2012 0520   BILITOT 0.90 10/19/2011 0817       RADIOGRAPHIC STUDIES: No results found.  ASSESSMENT AND PLAN: This is a very pleasant 77 years old white male with stage IIIB/IV non-small cell lung cancer currently on systemic chemotherapy with weekly docetaxel and tolerating it fairly well. We will proceed with cycle #6 today as scheduled. The patient would come back for followup visit in 2 weeks for evaluation. He was advised to call immediately if he has any concerning symptoms in the interval.  All questions were answered. The patient knows to call the clinic with any problems, questions or concerns. We can certainly see the patient much sooner if necessary.

## 2012-09-18 ENCOUNTER — Ambulatory Visit (HOSPITAL_BASED_OUTPATIENT_CLINIC_OR_DEPARTMENT_OTHER): Payer: Medicare Other

## 2012-09-18 ENCOUNTER — Other Ambulatory Visit (HOSPITAL_BASED_OUTPATIENT_CLINIC_OR_DEPARTMENT_OTHER): Payer: Medicare Other | Admitting: Lab

## 2012-09-18 DIAGNOSIS — Z5111 Encounter for antineoplastic chemotherapy: Secondary | ICD-10-CM

## 2012-09-18 DIAGNOSIS — C3430 Malignant neoplasm of lower lobe, unspecified bronchus or lung: Secondary | ICD-10-CM

## 2012-09-18 LAB — COMPREHENSIVE METABOLIC PANEL (CC13)
ALT: 12 U/L (ref 0–55)
AST: 10 U/L (ref 5–34)
Albumin: 2.6 g/dL — ABNORMAL LOW (ref 3.5–5.0)
Calcium: 9.3 mg/dL (ref 8.4–10.4)
Chloride: 101 mEq/L (ref 98–109)
Creatinine: 0.9 mg/dL (ref 0.7–1.3)
Potassium: 3.9 mEq/L (ref 3.5–5.1)

## 2012-09-18 LAB — CBC WITH DIFFERENTIAL/PLATELET
BASO%: 0.2 % (ref 0.0–2.0)
MCHC: 32.8 g/dL (ref 32.0–36.0)
MONO#: 0.7 10*3/uL (ref 0.1–0.9)
RBC: 3.08 10*6/uL — ABNORMAL LOW (ref 4.20–5.82)
WBC: 9.4 10*3/uL (ref 4.0–10.3)
lymph#: 0.7 10*3/uL — ABNORMAL LOW (ref 0.9–3.3)
nRBC: 1 % — ABNORMAL HIGH (ref 0–0)

## 2012-09-18 MED ORDER — DEXAMETHASONE SODIUM PHOSPHATE 10 MG/ML IJ SOLN
10.0000 mg | Freq: Once | INTRAMUSCULAR | Status: AC
  Administered 2012-09-18: 10 mg via INTRAVENOUS

## 2012-09-18 MED ORDER — SODIUM CHLORIDE 0.9 % IV SOLN
Freq: Once | INTRAVENOUS | Status: AC
  Administered 2012-09-18: 15:00:00 via INTRAVENOUS

## 2012-09-18 MED ORDER — HEPARIN SOD (PORK) LOCK FLUSH 100 UNIT/ML IV SOLN
500.0000 [IU] | Freq: Once | INTRAVENOUS | Status: AC | PRN
  Administered 2012-09-18: 500 [IU]
  Filled 2012-09-18: qty 5

## 2012-09-18 MED ORDER — ONDANSETRON 8 MG/50ML IVPB (CHCC)
8.0000 mg | Freq: Once | INTRAVENOUS | Status: AC
  Administered 2012-09-18: 8 mg via INTRAVENOUS

## 2012-09-18 MED ORDER — SODIUM CHLORIDE 0.9 % IJ SOLN
10.0000 mL | INTRAMUSCULAR | Status: DC | PRN
  Administered 2012-09-18: 10 mL
  Filled 2012-09-18: qty 10

## 2012-09-18 MED ORDER — SODIUM CHLORIDE 0.9 % IV SOLN
25.0000 mg/m2 | Freq: Once | INTRAVENOUS | Status: AC
  Administered 2012-09-18: 50 mg via INTRAVENOUS
  Filled 2012-09-18: qty 5

## 2012-09-18 NOTE — Patient Instructions (Addendum)
[facility] Discharge Instructions for Patients Receiving Chemotherapy  Today you received the following chemotherapy agents: Taxotere  To help prevent nausea and vomiting after your treatment, we encourage you to take your nausea medication as prescribed.    If you develop nausea and vomiting that is not controlled by your nausea medication, call the clinic.   BELOW ARE SYMPTOMS THAT SHOULD BE REPORTED IMMEDIATELY:  *FEVER GREATER THAN 100.5 F  *CHILLS WITH OR WITHOUT FEVER  NAUSEA AND VOMITING THAT IS NOT CONTROLLED WITH YOUR NAUSEA MEDICATION  *UNUSUAL SHORTNESS OF BREATH  *UNUSUAL BRUISING OR BLEEDING  TENDERNESS IN MOUTH AND THROAT WITH OR WITHOUT PRESENCE OF ULCERS  *URINARY PROBLEMS  *BOWEL PROBLEMS  UNUSUAL RASH Items with * indicate a potential emergency and should be followed up as soon as possible.  Feel free to call the clinic you have any questions or concerns. The clinic phone number is [phone].    

## 2012-09-23 ENCOUNTER — Encounter (HOSPITAL_COMMUNITY): Payer: Self-pay | Admitting: *Deleted

## 2012-09-23 ENCOUNTER — Emergency Department (HOSPITAL_COMMUNITY): Payer: Medicare Other

## 2012-09-23 ENCOUNTER — Emergency Department (HOSPITAL_COMMUNITY)
Admission: EM | Admit: 2012-09-23 | Discharge: 2012-09-23 | Disposition: A | Discharge status: Home / Self Care | Payer: Medicare Other | Attending: Emergency Medicine | Admitting: Emergency Medicine

## 2012-09-23 DIAGNOSIS — R5383 Other fatigue: Secondary | ICD-10-CM | POA: Insufficient documentation

## 2012-09-23 DIAGNOSIS — R55 Syncope and collapse: Secondary | ICD-10-CM | POA: Insufficient documentation

## 2012-09-23 DIAGNOSIS — R630 Anorexia: Secondary | ICD-10-CM | POA: Insufficient documentation

## 2012-09-23 DIAGNOSIS — Z7982 Long term (current) use of aspirin: Secondary | ICD-10-CM | POA: Insufficient documentation

## 2012-09-23 DIAGNOSIS — Z87891 Personal history of nicotine dependence: Secondary | ICD-10-CM | POA: Insufficient documentation

## 2012-09-23 DIAGNOSIS — Z923 Personal history of irradiation: Secondary | ICD-10-CM | POA: Insufficient documentation

## 2012-09-23 DIAGNOSIS — J4489 Other specified chronic obstructive pulmonary disease: Secondary | ICD-10-CM | POA: Insufficient documentation

## 2012-09-23 DIAGNOSIS — R5381 Other malaise: Secondary | ICD-10-CM | POA: Insufficient documentation

## 2012-09-23 DIAGNOSIS — J449 Chronic obstructive pulmonary disease, unspecified: Secondary | ICD-10-CM | POA: Insufficient documentation

## 2012-09-23 DIAGNOSIS — I10 Essential (primary) hypertension: Secondary | ICD-10-CM | POA: Insufficient documentation

## 2012-09-23 DIAGNOSIS — E7800 Pure hypercholesterolemia, unspecified: Secondary | ICD-10-CM | POA: Insufficient documentation

## 2012-09-23 DIAGNOSIS — R110 Nausea: Secondary | ICD-10-CM | POA: Insufficient documentation

## 2012-09-23 DIAGNOSIS — Z9221 Personal history of antineoplastic chemotherapy: Secondary | ICD-10-CM | POA: Insufficient documentation

## 2012-09-23 DIAGNOSIS — Z79899 Other long term (current) drug therapy: Secondary | ICD-10-CM | POA: Insufficient documentation

## 2012-09-23 DIAGNOSIS — Z85118 Personal history of other malignant neoplasm of bronchus and lung: Secondary | ICD-10-CM | POA: Insufficient documentation

## 2012-09-23 DIAGNOSIS — I509 Heart failure, unspecified: Secondary | ICD-10-CM | POA: Insufficient documentation

## 2012-09-23 DIAGNOSIS — E119 Type 2 diabetes mellitus without complications: Secondary | ICD-10-CM | POA: Insufficient documentation

## 2012-09-23 DIAGNOSIS — R531 Weakness: Secondary | ICD-10-CM

## 2012-09-23 LAB — CBC WITH DIFFERENTIAL/PLATELET
Basophils Absolute: 0 10*3/uL (ref 0.0–0.1)
Eosinophils Absolute: 0 10*3/uL (ref 0.0–0.7)
Lymphocytes Relative: 9 % — ABNORMAL LOW (ref 12–46)
MCH: 30.6 pg (ref 26.0–34.0)
MCHC: 33.6 g/dL (ref 30.0–36.0)
Monocytes Absolute: 0.5 10*3/uL (ref 0.1–1.0)
Neutro Abs: 3.9 10*3/uL (ref 1.7–7.7)
Neutrophils Relative %: 81 % — ABNORMAL HIGH (ref 43–77)
RDW: 17.7 % — ABNORMAL HIGH (ref 11.5–15.5)

## 2012-09-23 LAB — POCT I-STAT, CHEM 8
Calcium, Ion: 1.24 mmol/L (ref 1.13–1.30)
Glucose, Bld: 106 mg/dL — ABNORMAL HIGH (ref 70–99)
HCT: 24 % — ABNORMAL LOW (ref 39.0–52.0)
Hemoglobin: 8.2 g/dL — ABNORMAL LOW (ref 13.0–17.0)
TCO2: 25 mmol/L (ref 0–100)

## 2012-09-23 LAB — PRO B NATRIURETIC PEPTIDE: Pro B Natriuretic peptide (BNP): 1153 pg/mL — ABNORMAL HIGH (ref 0–450)

## 2012-09-23 MED ORDER — HEPARIN SOD (PORK) LOCK FLUSH 100 UNIT/ML IV SOLN
INTRAVENOUS | Status: AC
  Administered 2012-09-23: 500 [IU]
  Filled 2012-09-23: qty 5

## 2012-09-23 MED ORDER — SODIUM CHLORIDE 0.9 % IV SOLN
Freq: Once | INTRAVENOUS | Status: AC
  Administered 2012-09-23: 17:00:00 via INTRAVENOUS

## 2012-09-23 NOTE — ED Notes (Signed)
Per ems: pt coming from home, family was going to bring pt to ED today d/t increased weakness and fatigue. This morning pt had near syncopical episode, so family members called EMS. Hx of lung cancer, emphysema and CHF. Pt wears 4L [place] at home. bp 143/91, pulse 83, saO2 96% 4L [place]

## 2012-09-23 NOTE — ED Notes (Signed)
[id number]<WG> Expected date:<BR> Expected time:<BR> Means of arrival:<BR> Comments:<BR> EMS: 77 y.o. Male Cancer pt. Weakness

## 2012-09-23 NOTE — ED Provider Notes (Signed)
History    CSN: [id number] Arrival date & time 09/23/12  1300  First MD Initiated Contact with Patient 09/23/12 1502     Chief Complaint  Patient presents with  . Near Syncope  . Weakness   (Consider location/radiation/quality/duration/timing/severity/associated sxs/prior Treatment) HPI  Patient presents after a series of near-syncopal episodes. Episodes occurred today, lasted several moments.  Episodes were non-prodromal, with a relatively short recovery phase but did not include any pain in his head or chest. There were no clear precipitating factors. He has had similar episodes for some time. Note, the patient has COPD, CHF, uses oxygen 24/7. No changes in this level recently. Additionally, the patient is currently getting chemotherapy for lung cancer. last chemotherapy was 5 days ago. He has 3 more scheduled sessions. Previously the patient has required a G-tube for supplemental feeds during particularly difficult period of radiation and/or chemotherapy. The patient manifested over the past weeks he's been increasingly weak, increasingly anorexic, with persistent nausea, but no new vomiting. He does have occasional bloody red stool.   Past Medical History  Diagnosis Date  . Lung cancer 2012    recurrence 12/2011  . Diabetes mellitus   . HTN (hypertension)   . High cholesterol   . Hx of radiation therapy 09/22/10 - 11/05/10    LLL lung  . History of chemotherapy   . History of radiation therapy 04/24/12-05/15/12    lllung 35Gy/14dx   Past Surgical History  Procedure Laterality Date  . Tonsillectomy    . Gastrostomy tube placement  2011    has been removed  . Portacath placement  08/2010   Family History  Problem Relation Age of Onset  . Heart disease Mother   . Heart disease Father   . Breast cancer Paternal Aunt    History  Substance Use Topics  . Smoking status: Former Smoker -- 4.00 packs/day for 34 years    Types: Cigarettes    Quit date: 03/15/1987  .  Smokeless tobacco: Not on file  . Alcohol Use: No    Review of Systems  Constitutional:       Per HPI, otherwise negative  HENT:       Per HPI, otherwise negative  Respiratory:       Per HPI, otherwise negative  Cardiovascular:       Per HPI, otherwise negative  Gastrointestinal: Positive for nausea. Negative for vomiting.  Endocrine:       Negative aside from HPI  Genitourinary:       Neg aside from HPI   Musculoskeletal:       Per HPI, otherwise negative  Skin: Negative.   Neurological: Negative for syncope.    Allergies  Codeine and Penicillins  Home Medications   Current Outpatient Rx  Name  Route  Sig  Dispense  Refill  . albuterol (PROVENTIL) (5 MG/ML) 0.5% nebulizer solution   Nebulization   Take 0.5 mLs (2.5 mg total) by nebulization every 4 (four) hours as needed for wheezing.   20 mL   3   . ALPRAZolam (XANAX) 0.5 MG tablet   Oral   Take 1 tablet (0.5 mg total) by mouth 3 (three) times daily as needed for anxiety.   60 tablet   0   . aspirin 81 MG chewable tablet   Oral   Chew 1 tablet (81 mg total) by mouth daily.         Marland Kitchen dexamethasone (DECADRON) 4 MG tablet   Oral   Take 1 tablet (  4 mg total) by mouth as directed. 2 tablets BID day before, day of, day after chemotherapy.   40 tablet   1   . glimepiride (AMARYL) 4 MG tablet   Oral   Take 8 mg by mouth daily. 2 tabs daily with breakfast         . HYDROcodone-acetaminophen (NORCO/VICODIN) 5-325 MG per tablet   Oral   Take 1-2 tablets by mouth every 4 (four) hours as needed.   60 tablet   0   . ibuprofen (ADVIL,MOTRIN) 400 MG tablet   Oral   Take 400 mg by mouth every 8 (eight) hours as needed for pain.         Marland Kitchen lactose free nutrition (BOOST PLUS) LIQD   Oral   Take 237 mLs by mouth 2 (two) times daily between meals.         . latanoprost (XALATAN) 0.005 % ophthalmic solution   Both Eyes   Place 1 drop into both eyes at bedtime.          . lovastatin (MEVACOR) 20 MG  tablet   Oral   Take 20 mg by mouth at bedtime.         Marland Kitchen nystatin (MYCOSTATIN) 100000 UNIT/ML suspension   Oral   Take 10,000 Units by mouth 4 (four) times daily.         . ondansetron (ZOFRAN) 8 MG tablet   Oral   Take 1 tablet (8 mg total) by mouth every 8 (eight) hours as needed (as needed for nausea and vomiting).   30 tablet   1   . pioglitazone (ACTOS) 30 MG tablet   Oral   Take 30 mg by mouth daily.          Marland Kitchen PRESCRIPTION MEDICATION   Intravenous   Inject 1 Syringe into the vein every 7 (seven) days. DOCEtaxel (TAXOTERE) 50 mg in sodium chloride 0.9 % 250 mL chemo infusion 25 mg/m2  1.98 m2 (Treatment Plan Actual)  Once 09/18/2012 09/18/2012. For a total of 9 treatments. Has 3 more to go. Dr. Arbutus Ped.         . prochlorperazine (COMPAZINE) 10 MG tablet   Oral   Take 1 tablet (10 mg total) by mouth every 6 (six) hours as needed.   30 tablet   0   . sitaGLIPtin (JANUVIA) 100 MG tablet   Oral   Take 100 mg by mouth daily.         Marland Kitchen tiotropium (SPIRIVA HANDIHALER) 18 MCG inhalation capsule   Inhalation   Place 1 capsule (18 mcg total) into inhaler and inhale daily.   30 capsule   6   . NON FORMULARY      Pt is currently receiving CHemotherapy          BP 98/59  Pulse 86  Temp(Src) 97.2 F (36.2 C) (Axillary)  Resp 20  SpO2 96% Physical Exam  Nursing note and vitals reviewed. Constitutional: He is oriented to person, place, and time. He appears well-developed. No distress.  HENT:  Head: Normocephalic and atraumatic.  Eyes: Conjunctivae and EOM are normal.  Cardiovascular: Normal rate and regular rhythm.   Pulmonary/Chest: Effort normal. No stridor. No respiratory distress.  Abdominal: He exhibits no distension. There is no tenderness.  Musculoskeletal: He exhibits no edema.  Neurological: He is alert and oriented to person, place, and time. No cranial nerve deficit. He exhibits normal muscle tone. Coordination normal.  Skin: Skin is warm and  dry.  Psychiatric:  He has a normal mood and affect.    ED Course  Procedures (including critical care time) Labs Reviewed  POCT I-STAT, CHEM 8 - Abnormal; Notable for the following:    Sodium 134 (*)    Potassium 3.3 (*)    Glucose, Bld 106 (*)    Hemoglobin 8.2 (*)    HCT 24.0 (*)    All other components within normal limits  CBC WITH DIFFERENTIAL  PRO B NATRIURETIC PEPTIDE   No results found. No diagnosis found. Room air 80% abnormal, corrects to 97% with supplemental oxygen. Cardiac 85 sinus rhythm normal   Date: 09/23/2012  Rate: 84  Rhythm: normal sinus rhythm  QRS Axis: normal  Intervals: normal  ST/T Wave abnormalities: nonspecific ST/T changes  Conduction Disutrbances:pvc  Narrative Interpretation:   Old EKG Reviewed: unchanged ABNORMAL   5:04 PM Patient feeling markedly better.  Initial labs discussed with him and his wife.  Patient still feels fatigued.  6:08 PM Patient remained hemodynamically stable. He has no new complaints. Admit the patient and his wife aware of all results again, discussed the need for prompt followup, with additional consideration of his near syncope and persistent weakness in light of his multiple medical problems. However, given the patient has these medical problems, as well as active cancer, and requested to have further evaluation as an outpatient is reasonable.  MDM  This patient with multiple medical problems, including COPD, CHF, ongoing therapy for lung cancer now presents after episodes of near-syncope with ongoing weakness and fatigue.  On exam he is awake and alert, with no dysrhythmia on ECG or monitoring.  Patient's labs are similar to multiple prior evaluations, and he felt substantially better with IV fluids here.  With his description of the by mouth intake decreased, there is some suspicion for malnutrition/dehydration.  Although the patient has multiple medical problems, he is hemodynamically stable, and his preference  is to go home and follow up with his oncology team promptly.  Patient has capacity, and this was executed.  Gerhard Munch, MD 09/23/12 [phone]

## 2012-09-24 ENCOUNTER — Other Ambulatory Visit: Payer: Self-pay | Admitting: *Deleted

## 2012-09-24 ENCOUNTER — Telehealth: Payer: Self-pay | Admitting: *Deleted

## 2012-09-24 ENCOUNTER — Ambulatory Visit (HOSPITAL_BASED_OUTPATIENT_CLINIC_OR_DEPARTMENT_OTHER): Payer: Medicare Other

## 2012-09-24 DIAGNOSIS — I959 Hypotension, unspecified: Secondary | ICD-10-CM

## 2012-09-24 DIAGNOSIS — R42 Dizziness and giddiness: Secondary | ICD-10-CM

## 2012-09-24 DIAGNOSIS — E860 Dehydration: Secondary | ICD-10-CM

## 2012-09-24 DIAGNOSIS — C3490 Malignant neoplasm of unspecified part of unspecified bronchus or lung: Secondary | ICD-10-CM

## 2012-09-24 MED ORDER — SODIUM CHLORIDE 0.9 % IV SOLN
Freq: Once | INTRAVENOUS | Status: DC
  Administered 2012-09-24: 16:00:00 via INTRAVENOUS

## 2012-09-24 MED ORDER — SODIUM CHLORIDE 0.9 % IJ SOLN
10.0000 mL | INTRAMUSCULAR | Status: DC | PRN
  Administered 2012-09-24: 10 mL via INTRAVENOUS
  Filled 2012-09-24: qty 10

## 2012-09-24 MED ORDER — HEPARIN SOD (PORK) LOCK FLUSH 100 UNIT/ML IV SOLN
500.0000 [IU] | Freq: Once | INTRAVENOUS | Status: AC
  Administered 2012-09-24: 500 [IU] via INTRAVENOUS
  Filled 2012-09-24: qty 5

## 2012-09-24 NOTE — Patient Instructions (Addendum)
Dehydration, Adult Dehydration is when you lose more fluids from the body than you take in. Vital organs like the kidneys, brain, and heart cannot function without a proper amount of fluids and salt. Any loss of fluids from the body can cause dehydration.  CAUSES   Vomiting.  Diarrhea.  Excessive sweating.  Excessive urine output.  Fever. SYMPTOMS  Mild dehydration  Thirst.  Dry lips.  Slightly dry mouth. Moderate dehydration  Very dry mouth.  Sunken eyes.  Skin does not bounce back quickly when lightly pinched and released.  Dark urine and decreased urine production.  Decreased tear production.  Headache. Severe dehydration  Very dry mouth.  Extreme thirst.  Rapid, weak pulse (more than 100 beats per minute at rest).  Cold hands and feet.  Not able to sweat in spite of heat and temperature.  Rapid breathing.  Blue lips.  Confusion and lethargy.  Difficulty being awakened.  Minimal urine production.  No tears. DIAGNOSIS  Your caregiver will diagnose dehydration based on your symptoms and your exam. Blood and urine tests will help confirm the diagnosis. The diagnostic evaluation should also identify the cause of dehydration. TREATMENT  Treatment of mild or moderate dehydration can often be done at home by increasing the amount of fluids that you drink. It is best to drink small amounts of fluid more often. Drinking too much at one time can make vomiting worse. Refer to the home care instructions below. Severe dehydration needs to be treated at the hospital where you will probably be given intravenous (IV) fluids that contain water and electrolytes. HOME CARE INSTRUCTIONS   Ask your caregiver about specific rehydration instructions.  Drink enough fluids to keep your urine clear or pale yellow.  Drink small amounts frequently if you have nausea and vomiting.  Eat as you normally do.  Avoid:  Foods or drinks high in sugar.  Carbonated  drinks.  Juice.  Extremely hot or cold fluids.  Drinks with caffeine.  Fatty, greasy foods.  Alcohol.  Tobacco.  Overeating.  Gelatin desserts.  Wash your hands well to avoid spreading bacteria and viruses.  Only take over-the-counter or prescription medicines for pain, discomfort, or fever as directed by your caregiver.  Ask your caregiver if you should continue all prescribed and over-the-counter medicines.  Keep all follow-up appointments with your caregiver. SEEK MEDICAL CARE IF:  You have abdominal pain and it increases or stays in one area (localizes).  You have a rash, stiff neck, or severe headache.  You are irritable, sleepy, or difficult to awaken.  You are weak, dizzy, or extremely thirsty. SEEK IMMEDIATE MEDICAL CARE IF:   You are unable to keep fluids down or you get worse despite treatment.  You have frequent episodes of vomiting or diarrhea.  You have blood or green matter (bile) in your vomit.  You have blood in your stool or your stool looks black and tarry.  You have not urinated in 6 to 8 hours, or you have only urinated a small amount of very dark urine.  You have a fever.  You faint. MAKE SURE YOU:   Understand these instructions.  Will watch your condition.  Will get help right away if you are not doing well or get worse. Document Released: 02/28/2005 Document Revised: 05/23/2011 Document Reviewed: 10/18/2010 [employer] Patient Information 2014 [employer].  

## 2012-09-24 NOTE — Telephone Encounter (Signed)
Pt's wife called stating that pt was in the ED for weakness and he had fainted.  They gave him some fluids in the ED.  She states he started to have diarrhea today.  Instructions given regarding imodium and pt will come in today for IVF.  She verbalized understanding regarding an appt.  SLJ

## 2012-09-25 ENCOUNTER — Other Ambulatory Visit (HOSPITAL_BASED_OUTPATIENT_CLINIC_OR_DEPARTMENT_OTHER): Payer: Medicare Other | Admitting: Lab

## 2012-09-25 ENCOUNTER — Encounter: Payer: Self-pay | Admitting: Internal Medicine

## 2012-09-25 ENCOUNTER — Telehealth: Payer: Self-pay | Admitting: Internal Medicine

## 2012-09-25 ENCOUNTER — Ambulatory Visit (HOSPITAL_BASED_OUTPATIENT_CLINIC_OR_DEPARTMENT_OTHER): Payer: Medicare Other | Admitting: Internal Medicine

## 2012-09-25 ENCOUNTER — Ambulatory Visit (HOSPITAL_BASED_OUTPATIENT_CLINIC_OR_DEPARTMENT_OTHER): Payer: Medicare Other

## 2012-09-25 DIAGNOSIS — I951 Orthostatic hypotension: Secondary | ICD-10-CM

## 2012-09-25 DIAGNOSIS — C3490 Malignant neoplasm of unspecified part of unspecified bronchus or lung: Secondary | ICD-10-CM

## 2012-09-25 DIAGNOSIS — E860 Dehydration: Secondary | ICD-10-CM

## 2012-09-25 DIAGNOSIS — C3492 Malignant neoplasm of unspecified part of left bronchus or lung: Secondary | ICD-10-CM

## 2012-09-25 DIAGNOSIS — R634 Abnormal weight loss: Secondary | ICD-10-CM

## 2012-09-25 DIAGNOSIS — R630 Anorexia: Secondary | ICD-10-CM

## 2012-09-25 DIAGNOSIS — R5381 Other malaise: Secondary | ICD-10-CM

## 2012-09-25 LAB — COMPREHENSIVE METABOLIC PANEL (CC13)
AST: 9 U/L (ref 5–34)
Albumin: 2.4 g/dL — ABNORMAL LOW (ref 3.5–5.0)
BUN: 24.5 mg/dL (ref 7.0–26.0)
CO2: 23 mEq/L (ref 22–29)
Calcium: 8.9 mg/dL (ref 8.4–10.4)
Chloride: 99 mEq/L (ref 98–109)
Creatinine: 1.1 mg/dL (ref 0.7–1.3)
Glucose: 275 mg/dl — ABNORMAL HIGH (ref 70–140)
Potassium: 4 mEq/L (ref 3.5–5.1)

## 2012-09-25 LAB — CBC WITH DIFFERENTIAL/PLATELET
BASO%: 0.6 % (ref 0.0–2.0)
Basophils Absolute: 0.1 10*3/uL (ref 0.0–0.1)
HCT: 28.7 % — ABNORMAL LOW (ref 38.4–49.9)
HGB: 9.6 g/dL — ABNORMAL LOW (ref 13.0–17.1)
LYMPH%: 7.4 % — ABNORMAL LOW (ref 14.0–49.0)
MCHC: 33.4 g/dL (ref 32.0–36.0)
MONO#: 0.5 10*3/uL (ref 0.1–0.9)
NEUT%: 86.1 % — ABNORMAL HIGH (ref 39.0–75.0)
Platelets: 256 10*3/uL (ref 140–400)
WBC: 8.8 10*3/uL (ref 4.0–10.3)
lymph#: 0.7 10*3/uL — ABNORMAL LOW (ref 0.9–3.3)

## 2012-09-25 MED ORDER — SODIUM CHLORIDE 0.9 % IJ SOLN
10.0000 mL | INTRAMUSCULAR | Status: DC | PRN
  Administered 2012-09-25: 10 mL
  Filled 2012-09-25: qty 10

## 2012-09-25 MED ORDER — SODIUM CHLORIDE 0.9 % IV SOLN
Freq: Once | INTRAVENOUS | Status: AC
  Administered 2012-09-25: 12:00:00 via INTRAVENOUS

## 2012-09-25 MED ORDER — METHYLPREDNISOLONE 4 MG PO KIT: PACK | ORAL | Status: DC

## 2012-09-25 MED ORDER — HEPARIN SOD (PORK) LOCK FLUSH 100 UNIT/ML IV SOLN
500.0000 [IU] | Freq: Once | INTRAVENOUS | Status: AC | PRN
  Administered 2012-09-25: 500 [IU]
  Filled 2012-09-25: qty 5

## 2012-09-25 NOTE — Progress Notes (Signed)
[facility] [phone]   [phone]  OFFICE PROGRESS NOTE  [name], MD [address] [place] [address]  PRINCIPAL DIAGNOSIS: Stage IIIB/IV non-small cell lung cancer, squamous cell carcinoma diagnosed in June 2012.   PRIOR THERAPY:  1) Status post a course of concurrent chemoradiation with weekly carboplatin and paclitaxel. Last dose of chemotherapy was given on November 01, 2010.  2) Systemic chemotherapy with carboplatin for an AUC of 5 given on day 1 and gemcitabine 1000 mg per meter square given on days 1 and 8 every 3 weeks status post 1 cycle. However due to thrombocytopenia from cycle 2 forward he will proceed with carboplatin for an AUC of 5 given on day 1 and gemcitabine at 800 mg per meter squared given on days 1 and 8 every 3 weeks. Status post 6 cycles with evidence for disease progression after cycle #6.  3) Palliative radiotherapy to the enlarging left lower lobe lung mass under the care of Dr. Kathrynn Running.  4) Systemic chemotherapy with docetaxel 75 mg/M2 every 3 weeks with Neulasta support, status post 2 cycles, first cycle was started on 06/12/2012. That was discontinued today secondary to intolerance.   CURRENT THERAPY: Systemic chemotherapy with docetaxel 25 mg/M2 every week, first cycle is expected on 08/08/2012. Status post 7 doses.   INTERVAL HISTORY: Lavaris Sexson Bayron 77 y.o. male returns to the clinic today for followup visit accompanied by his wife. The patient still complaining of increasing fatigue and weakness. He also has dizzy spells and orthostatic hypotension. He was seen at the emergency department recently and received IV fluid. He also continues to complain of lack of appetite and weight loss as well as generalized weakness. He denied having any significant chest pain but continues to have shortness breath with exertion, no cough or hemoptysis. He was supposed to start dose 8 of his chemotherapy  today.  MEDICAL HISTORY: Past Medical History  Diagnosis Date  . Lung cancer 2012    recurrence 12/2011  . Diabetes mellitus   . HTN (hypertension)   . High cholesterol   . Hx of radiation therapy 09/22/10 - 11/05/10    LLL lung  . History of chemotherapy   . History of radiation therapy 04/24/12-05/15/12    lllung 35Gy/14dx    ALLERGIES:  is allergic to codeine and penicillins.  MEDICATIONS:  Current Outpatient Prescriptions  Medication Sig Dispense Refill  . albuterol (PROVENTIL) (5 MG/ML) 0.5% nebulizer solution Take 0.5 mLs (2.5 mg total) by nebulization every 4 (four) hours as needed for wheezing.  20 mL  3  . ALPRAZolam (XANAX) 0.5 MG tablet Take 1 tablet (0.5 mg total) by mouth 3 (three) times daily as needed for anxiety.  60 tablet  0  . aspirin 81 MG chewable tablet Chew 1 tablet (81 mg total) by mouth daily.      Marland Kitchen dexamethasone (DECADRON) 4 MG tablet Take 1 tablet (4 mg total) by mouth as directed. 2 tablets BID day before, day of, day after chemotherapy.  40 tablet  1  . glimepiride (AMARYL) 4 MG tablet Take 8 mg by mouth daily. 2 tabs daily with breakfast      . HYDROcodone-acetaminophen (NORCO/VICODIN) 5-325 MG per tablet Take 1-2 tablets by mouth every 4 (four) hours as needed.  60 tablet  0  . ibuprofen (ADVIL,MOTRIN) 400 MG tablet Take 400 mg by mouth every 8 (eight) hours as needed for pain.      Marland Kitchen lactose  free nutrition (BOOST PLUS) LIQD Take 237 mLs by mouth 2 (two) times daily between meals.      . latanoprost (XALATAN) 0.005 % ophthalmic solution Place 1 drop into both eyes at bedtime.       . lovastatin (MEVACOR) 20 MG tablet Take 20 mg by mouth at bedtime.      . NON FORMULARY Pt is currently receiving CHemotherapy      . nystatin (MYCOSTATIN) 100000 UNIT/ML suspension Take 10,000 Units by mouth 4 (four) times daily.      . ondansetron (ZOFRAN) 8 MG tablet Take 1 tablet (8 mg total) by mouth every 8 (eight) hours as needed (as needed for nausea and vomiting).  30  tablet  1  . pioglitazone (ACTOS) 30 MG tablet Take 30 mg by mouth daily.       Marland Kitchen PRESCRIPTION MEDICATION Inject 1 Syringe into the vein every 7 (seven) days. DOCEtaxel (TAXOTERE) 50 mg in sodium chloride 0.9 % 250 mL chemo infusion 25 mg/m2  1.98 m2 (Treatment Plan Actual)  Once 09/18/2012 09/18/2012. For a total of 9 treatments. Has 3 more to go. Dr. Arbutus Ped.      . prochlorperazine (COMPAZINE) 10 MG tablet Take 1 tablet (10 mg total) by mouth every 6 (six) hours as needed.  30 tablet  0  . sitaGLIPtin (JANUVIA) 100 MG tablet Take 100 mg by mouth daily.      Marland Kitchen tiotropium (SPIRIVA HANDIHALER) 18 MCG inhalation capsule Place 1 capsule (18 mcg total) into inhaler and inhale daily.  30 capsule  6   No current facility-administered medications for this visit.   Facility-Administered Medications Ordered in Other Visits  Medication Dose Route Frequency Provider Last Rate Last Dose  . sodium chloride 0.9 % injection 10 mL  10 mL Intracatheter PRN Si Gaul, MD   10 mL at 11/24/11 1637    SURGICAL HISTORY:  Past Surgical History  Procedure Laterality Date  . Tonsillectomy    . Gastrostomy tube placement  2011    has been removed  . Portacath placement  08/2010    REVIEW OF SYSTEMS:  A comprehensive review of systems was negative except for: Constitutional: positive for anorexia, fatigue and weight loss Respiratory: positive for dyspnea on exertion Musculoskeletal: positive for muscle weakness   PHYSICAL EXAMINATION: General appearance: alert, cooperative and no distress Head: Normocephalic, without obvious abnormality, atraumatic Neck: no adenopathy Lymph nodes: Cervical, supraclavicular, and axillary nodes normal. Resp: clear to auscultation bilaterally Cardio: regular rate and rhythm, S1, S2 normal, no murmur, click, rub or gallop GI: soft, non-tender; bowel sounds normal; no masses,  no organomegaly Extremities: extremities normal, atraumatic, no cyanosis or edema  ECOG PERFORMANCE  STATUS: 2 - Symptomatic, <50% confined to bed  Blood pressure 105/64, pulse 78, temperature 96.7 F (35.9 C), temperature source Oral, resp. rate 20, height 5\' 10"  (1.778 m), weight 170 lb 1.6 oz (77.157 kg).  LABORATORY DATA: Lab Results  Component Value Date   WBC 8.8 09/25/2012   HGB 9.6* 09/25/2012   HCT 28.7* 09/25/2012   MCV 92.0 09/25/2012   PLT 256 09/25/2012      Chemistry      Component Value Date/Time   NA 134* 09/23/2012 1356   NA 135* 09/18/2012 1354   NA 134 10/19/2011 0817   K 3.3* 09/23/2012 1356   K 3.9 09/18/2012 1354   K 4.5 10/19/2011 0817   CL 98 09/23/2012 1356   CL 102 09/04/2012 1017   CL 99 10/19/2011 0817  CO2 27 09/18/2012 1354   CO2 30 07/22/2012 0500   CO2 27 10/19/2011 0817   BUN 18 09/23/2012 1356   BUN 22.8 09/18/2012 1354   BUN 13 10/19/2011 0817   CREATININE 0.90 09/23/2012 1356   CREATININE 0.9 09/18/2012 1354   CREATININE 1.4* 10/19/2011 0817      Component Value Date/Time   CALCIUM 9.3 09/18/2012 1354   CALCIUM 8.2* 07/22/2012 0500   CALCIUM 9.3 10/19/2011 0817   ALKPHOS 56 09/18/2012 1354   ALKPHOS 77 07/12/2012 0520   ALKPHOS 48 10/19/2011 0817   AST 10 09/18/2012 1354   AST 12 07/12/2012 0520   AST 19 10/19/2011 0817   ALT 12 09/18/2012 1354   ALT 13 07/12/2012 0520   BILITOT 0.57 09/18/2012 1354   BILITOT 0.6 07/12/2012 0520   BILITOT 0.90 10/19/2011 0817       RADIOGRAPHIC STUDIES: Dg Chest 2 View  09/23/2012   *RADIOLOGY REPORT*  Clinical Data: Weakness and worsened shortness of breath.  History of lung cancer.  CHEST - 2 VIEW  Comparison: CT chest 06/06/2012 and plain film chest 07/11/2012.  Findings: Left lower lobe pulmonary mass is again identified. Airspace opacity in the left lung base appears somewhat increased compatible with postobstructive pneumonitis.  The right lung is clear.  No pneumothorax.  No pleural effusion.  Heart size is normal.  IMPRESSION: Left lower lobe mass with mild increase in left basilar opacity which could be due to postobstructive pneumonitis.    Original Report Authenticated By: Holley Dexter, M.D.   Ct Head Wo Contrast  09/23/2012   *RADIOLOGY REPORT*  Clinical Data: Near syncope.  Lung cancer  CT HEAD WITHOUT CONTRAST  Technique:  Contiguous axial images were obtained from the base of the skull through the vertex without contrast.  Comparison: CT 08/23/2010  Findings: Generalized atrophy is present.  This is unchanged.  Mild chronic microvascular ischemic change in the white matter.  No acute infarct, hemorrhage, or mass lesion.  No edema.  Mild mucosal thickening in the maxillary sinuses.  IMPRESSION: Atrophy and mild chronic microvascular ischemia.  No acute abnormality.   Original Report Authenticated By: Janeece Riggers, M.D.    ASSESSMENT AND PLAN: This is a very pleasant 77 years old white male with stage IIIB/IV non-small cell lung cancer recently treated with weekly docetaxel. He continues to have increasing fatigue and weakness as well as orthostatic hypotension and dehydration. I recommended for the patient to hold his chemotherapy for now. I will arrange for him to receive 1 L of normal saline today. I will start the patient on Medrol Dosepak for the lack of appetite and weight loss. He would come back for followup visit in 2 weeks with repeat CT scan of the chest, abdomen and pelvis for restaging of his disease. He was advised to call immediately if he has any concerning symptoms in the interval. All questions were answered. The patient knows to call the clinic with any problems, questions or concerns. We can certainly see the patient much sooner if necessary.

## 2012-09-25 NOTE — Patient Instructions (Signed)
Will hold chemotherapy for now. IV hydration today Followup visit in 2 weeks with repeat CT scans.

## 2012-09-25 NOTE — Telephone Encounter (Signed)
Gave pt appt for lab  before Ct and Md for on 7/31, gave pt oral contrast

## 2012-09-25 NOTE — Patient Instructions (Addendum)
Dehydration, Adult Dehydration is when you lose more fluids from the body than you take in. Vital organs like the kidneys, brain, and heart cannot function without a proper amount of fluids and salt. Any loss of fluids from the body can cause dehydration.  CAUSES   Vomiting.  Diarrhea.  Excessive sweating.  Excessive urine output.  Fever. SYMPTOMS  Mild dehydration  Thirst.  Dry lips.  Slightly dry mouth. Moderate dehydration  Very dry mouth.  Sunken eyes.  Skin does not bounce back quickly when lightly pinched and released.  Dark urine and decreased urine production.  Decreased tear production.  Headache. Severe dehydration  Very dry mouth.  Extreme thirst.  Rapid, weak pulse (more than 100 beats per minute at rest).  Cold hands and feet.  Not able to sweat in spite of heat and temperature.  Rapid breathing.  Blue lips.  Confusion and lethargy.  Difficulty being awakened.  Minimal urine production.  No tears. DIAGNOSIS  Your caregiver will diagnose dehydration based on your symptoms and your exam. Blood and urine tests will help confirm the diagnosis. The diagnostic evaluation should also identify the cause of dehydration. TREATMENT  Treatment of mild or moderate dehydration can often be done at home by increasing the amount of fluids that you drink. It is best to drink small amounts of fluid more often. Drinking too much at one time can make vomiting worse. Refer to the home care instructions below. Severe dehydration needs to be treated at the hospital where you will probably be given intravenous (IV) fluids that contain water and electrolytes. HOME CARE INSTRUCTIONS   Ask your caregiver about specific rehydration instructions.  Drink enough fluids to keep your urine clear or pale yellow.  Drink small amounts frequently if you have nausea and vomiting.  Eat as you normally do.  Avoid:  Foods or drinks high in sugar.  Carbonated  drinks.  Juice.  Extremely hot or cold fluids.  Drinks with caffeine.  Fatty, greasy foods.  Alcohol.  Tobacco.  Overeating.  Gelatin desserts.  Wash your hands well to avoid spreading bacteria and viruses.  Only take over-the-counter or prescription medicines for pain, discomfort, or fever as directed by your caregiver.  Ask your caregiver if you should continue all prescribed and over-the-counter medicines.  Keep all follow-up appointments with your caregiver. SEEK MEDICAL CARE IF:  You have abdominal pain and it increases or stays in one area (localizes).  You have a rash, stiff neck, or severe headache.  You are irritable, sleepy, or difficult to awaken.  You are weak, dizzy, or extremely thirsty. SEEK IMMEDIATE MEDICAL CARE IF:   You are unable to keep fluids down or you get worse despite treatment.  You have frequent episodes of vomiting or diarrhea.  You have blood or green matter (bile) in your vomit.  You have blood in your stool or your stool looks black and tarry.  You have not urinated in 6 to 8 hours, or you have only urinated a small amount of very dark urine.  You have a fever.  You faint. MAKE SURE YOU:   Understand these instructions.  Will watch your condition.  Will get help right away if you are not doing well or get worse. Document Released: 02/28/2005 Document Revised: 05/23/2011 Document Reviewed: 10/18/2010 [employer] Patient Information 2014 [employer].  

## 2012-10-02 ENCOUNTER — Encounter: Payer: Medicare Other | Admitting: Nutrition

## 2012-10-02 ENCOUNTER — Other Ambulatory Visit: Payer: Medicare Other | Admitting: Lab

## 2012-10-02 ENCOUNTER — Ambulatory Visit: Payer: Medicare Other

## 2012-10-03 ENCOUNTER — Ambulatory Visit: Payer: Medicare Other | Admitting: Critical Care Medicine

## 2012-10-03 ENCOUNTER — Telehealth: Payer: Self-pay | Admitting: Medical Oncology

## 2012-10-03 NOTE — Telephone Encounter (Signed)
Faxed rx for light weight wheelchair to [facility]

## 2012-10-04 ENCOUNTER — Encounter: Payer: Self-pay | Admitting: Medical Oncology

## 2012-10-04 NOTE — Progress Notes (Signed)
erroneous

## 2012-10-04 NOTE — Telephone Encounter (Signed)
Need further documentation for wheelchair. I called wife and told her Dr Arbutus Ped will evaluate him at next appt. Wife stated that Emanuell has a walker but he cannot self-propel himself and cannot walk with a cane.

## 2012-10-09 ENCOUNTER — Telehealth: Payer: Self-pay | Admitting: *Deleted

## 2012-10-09 ENCOUNTER — Other Ambulatory Visit: Payer: Medicare Other | Admitting: Lab

## 2012-10-09 ENCOUNTER — Other Ambulatory Visit (HOSPITAL_BASED_OUTPATIENT_CLINIC_OR_DEPARTMENT_OTHER): Payer: Medicare Other | Admitting: Lab

## 2012-10-09 ENCOUNTER — Ambulatory Visit: Payer: Medicare Other

## 2012-10-09 ENCOUNTER — Ambulatory Visit (HOSPITAL_COMMUNITY)
Admission: RE | Admit: 2012-10-09 | Discharge: 2012-10-09 | Disposition: A | Discharge status: Home / Self Care | Payer: Medicare Other | Source: Ambulatory Visit | Attending: Internal Medicine | Admitting: Internal Medicine

## 2012-10-09 DIAGNOSIS — K8689 Other specified diseases of pancreas: Secondary | ICD-10-CM | POA: Insufficient documentation

## 2012-10-09 DIAGNOSIS — J189 Pneumonia, unspecified organism: Secondary | ICD-10-CM | POA: Insufficient documentation

## 2012-10-09 DIAGNOSIS — K5289 Other specified noninfective gastroenteritis and colitis: Secondary | ICD-10-CM | POA: Insufficient documentation

## 2012-10-09 DIAGNOSIS — Z9221 Personal history of antineoplastic chemotherapy: Secondary | ICD-10-CM | POA: Insufficient documentation

## 2012-10-09 DIAGNOSIS — K37 Unspecified appendicitis: Secondary | ICD-10-CM | POA: Insufficient documentation

## 2012-10-09 DIAGNOSIS — K5730 Diverticulosis of large intestine without perforation or abscess without bleeding: Secondary | ICD-10-CM | POA: Insufficient documentation

## 2012-10-09 DIAGNOSIS — Z923 Personal history of irradiation: Secondary | ICD-10-CM | POA: Insufficient documentation

## 2012-10-09 DIAGNOSIS — C3490 Malignant neoplasm of unspecified part of unspecified bronchus or lung: Secondary | ICD-10-CM

## 2012-10-09 LAB — COMPREHENSIVE METABOLIC PANEL (CC13)
AST: 11 U/L (ref 5–34)
Albumin: 2.7 g/dL — ABNORMAL LOW (ref 3.5–5.0)
BUN: 16.8 mg/dL (ref 7.0–26.0)
CO2: 26 mEq/L (ref 22–29)
Calcium: 8.9 mg/dL (ref 8.4–10.4)
Chloride: 104 mEq/L (ref 98–109)
Creatinine: 0.8 mg/dL (ref 0.7–1.3)
Potassium: 4 mEq/L (ref 3.5–5.1)

## 2012-10-09 LAB — CBC WITH DIFFERENTIAL/PLATELET
Basophils Absolute: 0.1 10*3/uL (ref 0.0–0.1)
EOS%: 0.8 % (ref 0.0–7.0)
Eosinophils Absolute: 0 10*3/uL (ref 0.0–0.5)
HCT: 25.7 % — ABNORMAL LOW (ref 38.4–49.9)
HGB: 8.7 g/dL — ABNORMAL LOW (ref 13.0–17.1)
MCH: 31.7 pg (ref 27.2–33.4)
MONO#: 0.6 10*3/uL (ref 0.1–0.9)
NEUT#: 4.9 10*3/uL (ref 1.5–6.5)
NEUT%: 76.9 % — ABNORMAL HIGH (ref 39.0–75.0)
RDW: 19.3 % — ABNORMAL HIGH (ref 11.0–14.6)
WBC: 6.3 10*3/uL (ref 4.0–10.3)
lymph#: 0.8 10*3/uL — ABNORMAL LOW (ref 0.9–3.3)

## 2012-10-09 MED ORDER — IOHEXOL 300 MG/ML  SOLN
100.0000 mL | Freq: Once | INTRAMUSCULAR | Status: AC | PRN
  Administered 2012-10-09: 100 mL via INTRAVENOUS

## 2012-10-09 NOTE — Telephone Encounter (Signed)
THIS RESULTS WERE CALLED AND FAXED TO THIS OFFICE. THE REPORT WAS GIVEN TO DR.MOHAMED.

## 2012-10-10 ENCOUNTER — Encounter: Payer: Self-pay | Admitting: Critical Care Medicine

## 2012-10-10 ENCOUNTER — Ambulatory Visit (INDEPENDENT_AMBULATORY_CARE_PROVIDER_SITE_OTHER): Payer: Medicare Other | Admitting: Critical Care Medicine

## 2012-10-10 VITALS — BP 110/66 | HR 84 | Temp 96.5°F | Ht 70.5 in | Wt 169.6 lb

## 2012-10-10 DIAGNOSIS — J441 Chronic obstructive pulmonary disease with (acute) exacerbation: Secondary | ICD-10-CM

## 2012-10-10 DIAGNOSIS — K529 Noninfective gastroenteritis and colitis, unspecified: Secondary | ICD-10-CM

## 2012-10-10 DIAGNOSIS — C3490 Malignant neoplasm of unspecified part of unspecified bronchus or lung: Secondary | ICD-10-CM

## 2012-10-10 DIAGNOSIS — R5381 Other malaise: Secondary | ICD-10-CM

## 2012-10-10 DIAGNOSIS — K5289 Other specified noninfective gastroenteritis and colitis: Secondary | ICD-10-CM

## 2012-10-10 MED ORDER — METRONIDAZOLE 500 MG PO TABS: 500.0000 mg | ORAL_TABLET | Freq: Three times a day (TID) | ORAL | Status: DC

## 2012-10-10 MED ORDER — CIPROFLOXACIN HCL 500 MG PO TABS: 500.0000 mg | ORAL_TABLET | Freq: Two times a day (BID) | ORAL | Status: DC

## 2012-10-10 NOTE — Progress Notes (Signed)
Subjective:    Patient ID: [name], male    DOB: 27-Nov-1935, 77 y.o.   MRN: [id number]  HPI Progressively worsening weakness with sob. Now lives on the basement floor to eliminate the hassle of going up the stairs and prevent worsening DOE.   Today had three spells of falling out. Has these episodes once a day for a month, especially when first sitting up. He feels light-headed, dizzy and has AMS. Spells worse over several months.  This is worse when first get up out of bed.  Also if get out of recliner. Weak all day long. When he fell out the first time (two months ago), his family states that he wasn't breathing when he fell out and they called 911. CAT score is a 20. Oxygen level 2.5L rest 3L exertion.   Refill for spiriva - has one refill left but won't mind another for today.    Review of Systems  Constitutional: Negative for fever, chills and fatigue.  HENT: Positive for nosebleeds and rhinorrhea. Negative for congestion, sneezing, postnasal drip and sinus pressure.   Respiratory: Positive for shortness of breath and wheezing. Negative for cough, chest tightness and stridor.        Objective:   Physical Exam  VITAL SIGNS:  BP 110/66  Pulse 84  Temp(Src) 96.5 F (35.8 C) (Oral)  Ht 5' 10.5" (1.791 m)  Wt 169 lb 9.6 oz (76.93 kg)  BMI 23.98 kg/m2  SpO2 92%   HEENT: Head is normocephalic and atraumatic. Extraocular muscles are intact. Pupils are equal, round, and reactive to light. Nares appeared normal. Mouth is well hydrated and without lesions. Mucous membranes are moist. Posterior pharynx clear of any exudate or lesions.  NECK: Supple. No carotid bruits. No lymphadenopathy or thyromegaly. LUNGS: Clear to auscultation. HEART: Regular rate and rhythm without murmur.  ABDOMEN: Soft, nontender, and nondistended. Positive bowel sounds. No hepatosplenomegaly was noted.  EXTREMITIES: Without any cyanosis, clubbing, rash, lesions or edema.  NEUROLOGIC: Cranial nerves II  through XII are grossly intact.  SKIN: No ulceration or induration present.    Abd Ct 7/31:  IMPRESSION:  1. No evidence of metastasis in the abdomen or pelvis.  2. New inflammation involving the terminal ileum and cecum.  Recommend correlation with enteritis or typhlitis. There are  multiple diverticula in this region so diverticulitis is also a  consideration.      Assessment & Plan:   Obstructive chronic bronchitis with exacerbation Golds C COPD Gold stage C. COPD with mild exacerbation and associated pneumonitis around the setting of the external beam radiation therapy and lung cancer Rx. Given the localized area of colitis on CT scan and associated diarrhea will need treatment for colitis in addition to pneumonitis Note the patient is become severely debilitated Plan Take cipro twice daily for 7days for pneumonitis Take flagyl 500mg  three times a day for 10days for colitis A light weight wheelchair will be ordered from [facility] to improve mobility Return 2 months   Colitis Right lower quadrant colitis at the terminal ileum and cecal connection Plan Cipro and Flagyl will be administered orally   Updated Medication List Outpatient Encounter Prescriptions as of 10/10/2012  Medication Sig Dispense Refill  . albuterol (PROVENTIL) (5 MG/ML) 0.5% nebulizer solution Take 0.5 mLs (2.5 mg total) by nebulization every 4 (four) hours as needed for wheezing.  20 mL  3  . ALPRAZolam (XANAX) 0.5 MG tablet Take 1 tablet (0.5 mg total) by mouth 3 (three) times daily  as needed for anxiety.  60 tablet  0  . aspirin 81 MG chewable tablet Chew 1 tablet (81 mg total) by mouth daily.      . Atorvastatin Calcium (LIPITOR PO) Take 1 tablet by mouth daily.      Marland Kitchen dexamethasone (DECADRON) 4 MG tablet Take 1 tablet (4 mg total) by mouth as directed. 2 tablets BID day before, day of, day after chemotherapy.  40 tablet  1  . glimepiride (AMARYL) 4 MG tablet Take 8 mg by mouth daily. 2 tabs  daily with breakfast      . HYDROcodone-acetaminophen (NORCO/VICODIN) 5-325 MG per tablet Take 1-2 tablets by mouth every 4 (four) hours as needed.  60 tablet  0  . ibuprofen (ADVIL,MOTRIN) 400 MG tablet Take 400 mg by mouth every 8 (eight) hours as needed for pain.      Marland Kitchen lactose free nutrition (BOOST PLUS) LIQD Take 237 mLs by mouth 2 (two) times daily between meals.      . latanoprost (XALATAN) 0.005 % ophthalmic solution Place 1 drop into both eyes at bedtime.       . NON FORMULARY Pt is currently receiving CHemotherapy      . nystatin (MYCOSTATIN) 100000 UNIT/ML suspension Take 10,000 Units by mouth 4 (four) times daily.      . ondansetron (ZOFRAN) 8 MG tablet Take 1 tablet (8 mg total) by mouth every 8 (eight) hours as needed (as needed for nausea and vomiting).  30 tablet  1  . pioglitazone (ACTOS) 30 MG tablet Take 30 mg by mouth daily.       Marland Kitchen PRESCRIPTION MEDICATION Inject 1 Syringe into the vein every 7 (seven) days. DOCEtaxel (TAXOTERE) 50 mg in sodium chloride 0.9 % 250 mL chemo infusion 25 mg/m2  1.98 m2 (Treatment Plan Actual)  Once 09/18/2012 09/18/2012. For a total of 9 treatments. Has 3 more to go. Dr. Arbutus Ped.      . prochlorperazine (COMPAZINE) 10 MG tablet Take 1 tablet (10 mg total) by mouth every 6 (six) hours as needed.  30 tablet  0  . sitaGLIPtin (JANUVIA) 100 MG tablet Take 100 mg by mouth daily.      Marland Kitchen tiotropium (SPIRIVA HANDIHALER) 18 MCG inhalation capsule Place 1 capsule (18 mcg total) into inhaler and inhale daily.  30 capsule  6  . ciprofloxacin (CIPRO) 500 MG tablet Take 1 tablet (500 mg total) by mouth 2 (two) times daily.  14 tablet  0  . metroNIDAZOLE (FLAGYL) 500 MG tablet Take 1 tablet (500 mg total) by mouth 3 (three) times daily.  30 tablet  0  . [DISCONTINUED] lovastatin (MEVACOR) 20 MG tablet Take 20 mg by mouth at bedtime.      . [DISCONTINUED] methylPREDNISolone (MEDROL DOSEPAK) 4 MG tablet follow package directions  21 tablet  0   Facility-Administered  Encounter Medications as of 10/10/2012  Medication Dose Route Frequency Provider Last Rate Last Dose  . sodium chloride 0.9 % injection 10 mL  10 mL Intracatheter PRN Si Gaul, MD   10 mL at 11/24/11 1637

## 2012-10-10 NOTE — Patient Instructions (Addendum)
Take cipro twice daily for 7days Take flagyl 500mg  three times a day for 10days A light weight wheelchair will be ordered from [facility] Return 2 months

## 2012-10-11 ENCOUNTER — Other Ambulatory Visit: Payer: Self-pay | Admitting: Medical Oncology

## 2012-10-11 ENCOUNTER — Ambulatory Visit: Payer: Medicare Other

## 2012-10-11 ENCOUNTER — Ambulatory Visit (HOSPITAL_BASED_OUTPATIENT_CLINIC_OR_DEPARTMENT_OTHER): Payer: Medicare Other | Admitting: Internal Medicine

## 2012-10-11 ENCOUNTER — Ambulatory Visit (HOSPITAL_BASED_OUTPATIENT_CLINIC_OR_DEPARTMENT_OTHER): Payer: Medicare Other

## 2012-10-11 ENCOUNTER — Encounter (HOSPITAL_COMMUNITY)
Admission: RE | Admit: 2012-10-11 | Discharge: 2012-10-11 | Disposition: A | Discharge status: Home / Self Care | Payer: Medicare Other | Source: Ambulatory Visit | Attending: Internal Medicine | Admitting: Internal Medicine

## 2012-10-11 VITALS — BP 92/46 | HR 78 | Temp 98.1°F | Resp 20

## 2012-10-11 DIAGNOSIS — T451X5A Adverse effect of antineoplastic and immunosuppressive drugs, initial encounter: Secondary | ICD-10-CM

## 2012-10-11 DIAGNOSIS — D649 Anemia, unspecified: Secondary | ICD-10-CM | POA: Insufficient documentation

## 2012-10-11 DIAGNOSIS — E860 Dehydration: Secondary | ICD-10-CM

## 2012-10-11 DIAGNOSIS — K529 Noninfective gastroenteritis and colitis, unspecified: Secondary | ICD-10-CM | POA: Insufficient documentation

## 2012-10-11 DIAGNOSIS — C3430 Malignant neoplasm of lower lobe, unspecified bronchus or lung: Secondary | ICD-10-CM

## 2012-10-11 LAB — HOLD TUBE, BLOOD BANK

## 2012-10-11 MED ORDER — SODIUM CHLORIDE 0.9 % IV SOLN
Freq: Once | INTRAVENOUS | Status: AC
  Administered 2012-10-11: 500 mL via INTRAVENOUS

## 2012-10-11 MED ORDER — DIPHENHYDRAMINE HCL 50 MG/ML IJ SOLN
25.0000 mg | Freq: Once | INTRAMUSCULAR | Status: AC
  Administered 2012-10-11: 25 mg via INTRAVENOUS

## 2012-10-11 MED ORDER — SODIUM CHLORIDE 0.9 % IJ SOLN
10.0000 mL | INTRAMUSCULAR | Status: AC | PRN
  Administered 2012-10-11: 10 mL
  Filled 2012-10-11: qty 10

## 2012-10-11 MED ORDER — SODIUM CHLORIDE 0.9 % IV SOLN: 250.0000 mL | Freq: Once | INTRAVENOUS | Status: DC

## 2012-10-11 MED ORDER — HEPARIN SOD (PORK) LOCK FLUSH 100 UNIT/ML IV SOLN
500.0000 [IU] | Freq: Every day | INTRAVENOUS | Status: AC | PRN
  Administered 2012-10-11: 500 [IU]
  Filled 2012-10-11: qty 5

## 2012-10-11 MED ORDER — ACETAMINOPHEN 325 MG PO TABS
650.0000 mg | ORAL_TABLET | Freq: Once | ORAL | Status: AC
  Administered 2012-10-11: 650 mg via ORAL

## 2012-10-11 NOTE — Progress Notes (Signed)
[facility] Telephone:([phone]) [phone]   Fax:([phone]) [phone]  OFFICE PROGRESS NOTE  [name], MD [address] [place] [address]  PRINCIPAL DIAGNOSIS AND STAGE: Stage IIIB/IV non-small cell lung cancer, squamous cell carcinoma diagnosed in June 2012.   PRIOR THERAPY:  1) Status post a course of concurrent chemoradiation with weekly carboplatin and paclitaxel. Last dose of chemotherapy was given on November 01, 2010.  2) Systemic chemotherapy with carboplatin for an AUC of 5 given on day 1 and gemcitabine 1000 mg per meter square given on days 1 and 8 every 3 weeks status post 1 cycle. However due to thrombocytopenia from cycle 2 forward he will proceed with carboplatin for an AUC of 5 given on day 1 and gemcitabine at 800 mg per meter squared given on days 1 and 8 every 3 weeks. Status post 6 cycles with evidence for disease progression after cycle #6.  3) Palliative radiotherapy to the enlarging left lower lobe lung mass under the care of Dr. Kathrynn Running.  4) Systemic chemotherapy with docetaxel 75 mg/M2 every 3 weeks with Neulasta support, status post 2 cycles, first cycle was started on 06/12/2012. That was discontinued today secondary to intolerance.  5) Systemic chemotherapy with docetaxel 25 mg/M2 every week, first cycle is expected on 08/08/2012. Status post 8 doses.   CURRENT THERAPY: None.   CHEMOTHERAPY INTENT: Palliative  CURRENT # OF CHEMOTHERAPY CYCLES: 8  CURRENT ANTIEMETICS: Zofran, dexamethasone and Compazine  CURRENT SMOKING STATUS: Currently nonsmoker  ORAL CHEMOTHERAPY AND CONSENT: None  CURRENT BISPHOSPHONATES USE: None  PAIN MANAGEMENT: 1/10. Ibuprofen when necessary.  NARCOTICS INDUCED CONSTIPATION: No constipation  LIVING WILL AND CODE STATUS: No CODE BLUE.   INTERVAL HISTORY: [name] 77 y.o. male returns to the clinic today for followup visit accompanied by his wife. The patient continues to have increasing  fatigue and weakness. He was seen yesterday by Dr. Delford Field and was started on treatment with Cipro and Flagyl for the questionable enteritis/typhlitis of the terminal ileum and cecum. The patient continues to have shortness breath with exertion. He denied having any significant nausea or vomiting. He denied having any chest pain, cough or hemoptysis. He tolerated the previous day doses off weekly docetaxel fairly well except for the fatigue and occasional dehydration. He has no significant peripheral neuropathy. The patient has repeat CT scan of the chest, abdomen and pelvis performed recently and he is here for evaluation and discussion of his scan results.  MEDICAL HISTORY: Past Medical History  Diagnosis Date  . Lung cancer 2012    recurrence 12/2011  . Diabetes mellitus   . HTN (hypertension)   . High cholesterol   . Hx of radiation therapy 09/22/10 - 11/05/10    LLL lung  . History of chemotherapy   . History of radiation therapy 04/24/12-05/15/12    lllung 35Gy/14dx    ALLERGIES:  is allergic to codeine and penicillins.  MEDICATIONS:  Current Outpatient Prescriptions  Medication Sig Dispense Refill  . albuterol (PROVENTIL) (5 MG/ML) 0.5% nebulizer solution Take 0.5 mLs (2.5 mg total) by nebulization every 4 (four) hours as needed for wheezing.  20 mL  3  . ALPRAZolam (XANAX) 0.5 MG tablet Take 1 tablet (0.5 mg total) by mouth 3 (three) times daily as needed for anxiety.  60 tablet  0  . aspirin 81 MG chewable tablet Chew 1 tablet (81 mg total) by mouth daily.      . Atorvastatin Calcium (LIPITOR PO) Take  1 tablet by mouth daily.      . ciprofloxacin (CIPRO) 500 MG tablet Take 1 tablet (500 mg total) by mouth 2 (two) times daily.  14 tablet  0  . dexamethasone (DECADRON) 4 MG tablet Take 1 tablet (4 mg total) by mouth as directed. 2 tablets BID day before, day of, day after chemotherapy.  40 tablet  1  . glimepiride (AMARYL) 4 MG tablet Take 8 mg by mouth daily. 2 tabs daily with breakfast       . HYDROcodone-acetaminophen (NORCO/VICODIN) 5-325 MG per tablet Take 1-2 tablets by mouth every 4 (four) hours as needed.  60 tablet  0  . ibuprofen (ADVIL,MOTRIN) 400 MG tablet Take 400 mg by mouth every 8 (eight) hours as needed for pain.      Marland Kitchen lactose free nutrition (BOOST PLUS) LIQD Take 237 mLs by mouth 2 (two) times daily between meals.      . latanoprost (XALATAN) 0.005 % ophthalmic solution Place 1 drop into both eyes at bedtime.       . metroNIDAZOLE (FLAGYL) 500 MG tablet Take 1 tablet (500 mg total) by mouth 3 (three) times daily.  30 tablet  0  . NON FORMULARY Pt is currently receiving CHemotherapy      . nystatin (MYCOSTATIN) 100000 UNIT/ML suspension Take 10,000 Units by mouth 4 (four) times daily.      . prochlorperazine (COMPAZINE) 10 MG tablet Take 1 tablet (10 mg total) by mouth every 6 (six) hours as needed.  30 tablet  0  . sitaGLIPtin (JANUVIA) 100 MG tablet Take 100 mg by mouth daily.      Marland Kitchen tiotropium (SPIRIVA HANDIHALER) 18 MCG inhalation capsule Place 1 capsule (18 mcg total) into inhaler and inhale daily.  30 capsule  6  . ondansetron (ZOFRAN) 8 MG tablet Take 1 tablet (8 mg total) by mouth every 8 (eight) hours as needed (as needed for nausea and vomiting).  30 tablet  1  . pioglitazone (ACTOS) 30 MG tablet Take 30 mg by mouth daily.       Marland Kitchen PRESCRIPTION MEDICATION Inject 1 Syringe into the vein every 7 (seven) days. DOCEtaxel (TAXOTERE) 50 mg in sodium chloride 0.9 % 250 mL chemo infusion 25 mg/m2  1.98 m2 (Treatment Plan Actual)  Once 09/18/2012 09/18/2012. For a total of 9 treatments. Has 3 more to go. Dr. Arbutus Ped.       No current facility-administered medications for this visit.   Facility-Administered Medications Ordered in Other Visits  Medication Dose Route Frequency Provider Last Rate Last Dose  . sodium chloride 0.9 % injection 10 mL  10 mL Intracatheter PRN Si Gaul, MD   10 mL at 11/24/11 1637    SURGICAL HISTORY:  Past Surgical History    Procedure Laterality Date  . Tonsillectomy    . Gastrostomy tube placement  2011    has been removed  . Portacath placement  08/2010    REVIEW OF SYSTEMS:  A comprehensive review of systems was negative except for: Constitutional: positive for anorexia, fatigue and weight loss Respiratory: positive for dyspnea on exertion Musculoskeletal: positive for muscle weakness   PHYSICAL EXAMINATION: General appearance: alert, cooperative, fatigued and no distress Head: Normocephalic, without obvious abnormality, atraumatic Neck: no adenopathy Lymph nodes: Cervical, supraclavicular, and axillary nodes normal. Resp: clear to auscultation bilaterally Cardio: regular rate and rhythm, S1, S2 normal, no murmur, click, rub or gallop GI: soft, non-tender; bowel sounds normal; no masses,  no organomegaly Extremities: extremities normal, atraumatic, no  cyanosis or edema Neurologic: Alert and oriented X 3, normal strength and tone. Normal symmetric reflexes. Normal coordination and gait  ECOG PERFORMANCE STATUS: 2 - Symptomatic, <50% confined to bed  Blood pressure 90/49, pulse 115, temperature 97.4 F (36.3 C), temperature source Oral, resp. rate 20, height 5' 10.5" (1.791 m), weight 169 lb 11.2 oz (76.975 kg).  LABORATORY DATA: Lab Results  Component Value Date   WBC 6.3 10/09/2012   HGB 8.7* 10/09/2012   HCT 25.7* 10/09/2012   MCV 94.0 10/09/2012   PLT 217 10/09/2012      Chemistry      Component Value Date/Time   NA 141 10/09/2012 0911   NA 134* 09/23/2012 1356   NA 134 10/19/2011 0817   K 4.0 10/09/2012 0911   K 3.3* 09/23/2012 1356   K 4.5 10/19/2011 0817   CL 98 09/23/2012 1356   CL 102 09/04/2012 1017   CL 99 10/19/2011 0817   CO2 26 10/09/2012 0911   CO2 30 07/22/2012 0500   CO2 27 10/19/2011 0817   BUN 16.8 10/09/2012 0911   BUN 18 09/23/2012 1356   BUN 13 10/19/2011 0817   CREATININE 0.8 10/09/2012 0911   CREATININE 0.90 09/23/2012 1356   CREATININE 1.4* 10/19/2011 0817      Component Value  Date/Time   CALCIUM 8.9 10/09/2012 0911   CALCIUM 8.2* 07/22/2012 0500   CALCIUM 9.3 10/19/2011 0817   ALKPHOS 70 10/09/2012 0911   ALKPHOS 77 07/12/2012 0520   ALKPHOS 48 10/19/2011 0817   AST 11 10/09/2012 0911   AST 12 07/12/2012 0520   AST 19 10/19/2011 0817   ALT 10 10/09/2012 0911   ALT 13 07/12/2012 0520   ALT 26 10/19/2011 0817   BILITOT 0.58 10/09/2012 0911   BILITOT 0.6 07/12/2012 0520   BILITOT 0.90 10/19/2011 0817       RADIOGRAPHIC STUDIES: Dg Chest 2 View  09/23/2012   *RADIOLOGY REPORT*  Clinical Data: Weakness and worsened shortness of breath.  History of lung cancer.  CHEST - 2 VIEW  Comparison: CT chest 06/06/2012 and plain film chest 07/11/2012.  Findings: Left lower lobe pulmonary mass is again identified. Airspace opacity in the left lung base appears somewhat increased compatible with postobstructive pneumonitis.  The right lung is clear.  No pneumothorax.  No pleural effusion.  Heart size is normal.  IMPRESSION: Left lower lobe mass with mild increase in left basilar opacity which could be due to postobstructive pneumonitis.   Original Report Authenticated By: Holley Dexter, M.D.   Ct Head Wo Contrast  09/23/2012   *RADIOLOGY REPORT*  Clinical Data: Near syncope.  Lung cancer  CT HEAD WITHOUT CONTRAST  Technique:  Contiguous axial images were obtained from the base of the skull through the vertex without contrast.  Comparison: CT 08/23/2010  Findings: Generalized atrophy is present.  This is unchanged.  Mild chronic microvascular ischemic change in the white matter.  No acute infarct, hemorrhage, or mass lesion.  No edema.  Mild mucosal thickening in the maxillary sinuses.  IMPRESSION: Atrophy and mild chronic microvascular ischemia.  No acute abnormality.   Original Report Authenticated By: Janeece Riggers, M.D.   Ct Chest W Contrast  10/09/2012   *RADIOLOGY REPORT*  Clinical Data:  Lung cancer restaging.  Chemotherapy and radiation therapy complete.  CT CHEST, ABDOMEN AND PELVIS WITH  CONTRAST  Technique:  Multidetector CT imaging of the chest, abdomen and pelvis was performed following the standard protocol during bolus administration of intravenous contrast.  Contrast: OMNIPAQUE IOHEXOL 300 MG/ML  SOLN  Comparison:  CT 06/06/2012  CT CHEST  Findings:  There is a port in the right chest wall.  No axillary or supraclavicular lymphadenopathy.  No mediastinal or hilar lymphadenopathy.  No pericardial fluid.  Within the left lower lobe, mass lesion measures 43 x 49 mm compared to 47 x 56 mm on prior remeasured.  There is now some cavitation within the center of this mass lesion.  There is adjacent pneumonitis and consolidation in the left lower lobe as well as small effusions.  There is pleural thickening along the left oblique fissure superiorly.  There is severe emphysematous change in the upper lobes.  No nodularity in the right lung.  IMPRESSION: 1. Mild interval contraction and central cavitation of the left lower lobe mass. 2.  Perilesional pneumonitis and consolidation in the left lower lobe is similar to prior.  CT ABDOMEN AND PELVIS  Findings:  No focal hepatic lesion.  The gallbladder is normal. There is calcifications through the pancreas with mild ductal dilatation likely related to chronic pancreatitis.  The spleen, adrenal glands, kidneys are normal.  The stomach, small bowel, appendix normal.  There is inflammation involving the cecum which is new from prior (images 86 through 83).  This is adjacent to the terminal ileum and in a region of multiple diverticula.  The colon and rectosigmoid colon are normal.  Abdominal aorta is normal caliber.  No retroperitoneal periportal lymphadenopathy.  No free fluid the pelvis.  The bladder and prostate gland normal. No pelvic lymphadenopathy.  IMPRESSION:  1.  No evidence of metastasis in the abdomen or pelvis. 2.  New inflammation involving the terminal ileum and cecum. Recommend correlation with enteritis or typhlitis. There are multiple  diverticula in this region so diverticulitis is also a consideration.  These results will be called to the ordering clinician or representative by the Radiologist Assistant, and communication documented in the PACS Dashboard.   Original Report Authenticated By: Genevive Bi, M.D.   ASSESSMENT AND PLAN: This is a very pleasant 77 years old white male with stage IIIB/4 non-small cell lung cancer, squamous cell carcinoma most recently treated with 8 doses of weekly docetaxel. His recent CT scan of the chest, abdomen and pelvis showed no significant evidence for disease progression but there was questionable enteritis/typhlitis of the terminal ileum and cecum. The patient was started yesterday on treatment with Cipro and Flagyl. I discussed the scan results with the patient and his wife. I recommended for him to take a break from chemotherapy for the next 3 months as his recent scan showed no evidence for disease progression and the patient has rough time with his current chemotherapy. For the chemotherapy-induced anemia, I will arrange for the patient received 1 unit of PRBCs transfusion. For dehydration, I will arrange for the patient received 1 L of normal saline today. I will see the patient back for followup visit in 3 months with repeat CT scan of the chest, abdomen and pelvis for restaging of his disease. He was advised to call immediately if he has any concerning symptoms in the interval.  The patient voices understanding of current disease status and treatment options and is in agreement with the current care plan.  All questions were answered. The patient knows to call the clinic with any problems, questions or concerns. We can certainly see the patient much sooner if necessary.  I spent 15 minutes counseling the patient face to face. The total time spent in  the appointment was 25 minutes.

## 2012-10-11 NOTE — Patient Instructions (Signed)
Blood Transfusion Information WHAT IS A BLOOD TRANSFUSION? A transfusion is the replacement of blood or some of its parts. Blood is made up of multiple cells which provide different functions.  Red blood cells carry oxygen and are used for blood loss replacement.  White blood cells fight against infection.  Platelets control bleeding.  Plasma helps clot blood.  Other blood products are available for specialized needs, such as hemophilia or other clotting disorders. BEFORE THE TRANSFUSION  Who gives blood for transfusions?   You may be able to donate blood to be used at a later date on yourself (autologous donation).  Relatives can be asked to donate blood. This is generally not any safer than if you have received blood from a stranger. The same precautions are taken to ensure safety when a relative's blood is donated.  Healthy volunteers who are fully evaluated to make sure their blood is safe. This is blood bank blood. Transfusion therapy is the safest it has ever been in the practice of medicine. Before blood is taken from a donor, a complete history is taken to make sure that person has no history of diseases nor engages in risky social behavior (examples are intravenous drug use or sexual activity with multiple partners). The donor's travel history is screened to minimize risk of transmitting infections, such as malaria. The donated blood is tested for signs of infectious diseases, such as HIV and hepatitis. The blood is then tested to be sure it is compatible with you in order to minimize the chance of a transfusion reaction. If you or a relative donates blood, this is often done in anticipation of surgery and is not appropriate for emergency situations. It takes many days to process the donated blood. RISKS AND COMPLICATIONS Although transfusion therapy is very safe and saves many lives, the main dangers of transfusion include:   Getting an infectious disease.  Developing a  transfusion reaction. This is an allergic reaction to something in the blood you were given. Every precaution is taken to prevent this. The decision to have a blood transfusion has been considered carefully by your caregiver before blood is given. Blood is not given unless the benefits outweigh the risks. AFTER THE TRANSFUSION  Right after receiving a blood transfusion, you will usually feel much better and more energetic. This is especially true if your red blood cells have gotten low (anemic). The transfusion raises the level of the red blood cells which carry oxygen, and this usually causes an energy increase.  The nurse administering the transfusion will monitor you carefully for complications. HOME CARE INSTRUCTIONS  No special instructions are needed after a transfusion. You may find your energy is better. Speak with your caregiver about any limitations on activity for underlying diseases you may have. SEEK MEDICAL CARE IF:   Your condition is not improving after your transfusion.  You develop redness or irritation at the intravenous (IV) site. SEEK IMMEDIATE MEDICAL CARE IF:  Any of the following symptoms occur over the next 12 hours:  Shaking chills.  You have a temperature by mouth above 102 F (38.9 C), not controlled by medicine.  Chest, back, or muscle pain.  People around you feel you are not acting correctly or are confused.  Shortness of breath or difficulty breathing.  Dizziness and fainting.  You get a rash or develop hives.  You have a decrease in urine output.  Your urine turns a dark color or changes to pink, red, or brown. Any of the following   symptoms occur over the next 10 days:  You have a temperature by mouth above 102 F (38.9 C), not controlled by medicine.  Shortness of breath.  Weakness after normal activity.  The white part of the eye turns yellow (jaundice).  You have a decrease in the amount of urine or are urinating less often.  Your  urine turns a dark color or changes to pink, red, or brown. Document Released: 02/26/2000 Document Revised: 05/23/2011 Document Reviewed: 10/15/2007 [facility] Patient Information 2014 [place], [place].   Dehydration, Adult Dehydration is when you lose more fluids from the body than you take in. Vital organs like the kidneys, brain, and heart cannot function without a proper amount of fluids and salt. Any loss of fluids from the body can cause dehydration.  CAUSES   Vomiting.  Diarrhea.  Excessive sweating.  Excessive urine output.  Fever. SYMPTOMS  Mild dehydration  Thirst.  Dry lips.  Slightly dry mouth. Moderate dehydration  Very dry mouth.  Sunken eyes.  Skin does not bounce back quickly when lightly pinched and released.  Dark urine and decreased urine production.  Decreased tear production.  Headache. Severe dehydration  Very dry mouth.  Extreme thirst.  Rapid, weak pulse (more than 100 beats per minute at rest).  Cold hands and feet.  Not able to sweat in spite of heat and temperature.  Rapid breathing.  Blue lips.  Confusion and lethargy.  Difficulty being awakened.  Minimal urine production.  No tears. DIAGNOSIS  Your caregiver will diagnose dehydration based on your symptoms and your exam. Blood and urine tests will help confirm the diagnosis. The diagnostic evaluation should also identify the cause of dehydration. TREATMENT  Treatment of mild or moderate dehydration can often be done at home by increasing the amount of fluids that you drink. It is best to drink small amounts of fluid more often. Drinking too much at one time can make vomiting worse. Refer to the home care instructions below. Severe dehydration needs to be treated at the hospital where you will probably be given intravenous (IV) fluids that contain water and electrolytes. HOME CARE INSTRUCTIONS   Ask your caregiver about specific rehydration instructions.  Drink enough  fluids to keep your urine clear or pale yellow.  Drink small amounts frequently if you have nausea and vomiting.  Eat as you normally do.  Avoid:  Foods or drinks high in sugar.  Carbonated drinks.  Juice.  Extremely hot or cold fluids.  Drinks with caffeine.  Fatty, greasy foods.  Alcohol.  Tobacco.  Overeating.  Gelatin desserts.  Wash your hands well to avoid spreading bacteria and viruses.  Only take over-the-counter or prescription medicines for pain, discomfort, or fever as directed by your caregiver.  Ask your caregiver if you should continue all prescribed and over-the-counter medicines.  Keep all follow-up appointments with your caregiver. SEEK MEDICAL CARE IF:  You have abdominal pain and it increases or stays in one area (localizes).  You have a rash, stiff neck, or severe headache.  You are irritable, sleepy, or difficult to awaken.  You are weak, dizzy, or extremely thirsty. SEEK IMMEDIATE MEDICAL CARE IF:   You are unable to keep fluids down or you get worse despite treatment.  You have frequent episodes of vomiting or diarrhea.  You have blood or green matter (bile) in your vomit.  You have blood in your stool or your stool looks black and tarry.  You have not urinated in 6 to 8 hours, or you  have only urinated a small amount of very dark urine.  You have a fever.  You faint. MAKE SURE YOU:   Understand these instructions.  Will watch your condition.  Will get help right away if you are not doing well or get worse. Document Released: 02/28/2005 Document Revised: 05/23/2011 Document Reviewed: 10/18/2010 [facility] Patient Information 2014 [place], [place].

## 2012-10-11 NOTE — Progress Notes (Signed)
HAR done

## 2012-10-11 NOTE — Assessment & Plan Note (Signed)
Right lower quadrant colitis at the terminal ileum and cecal connection Plan Cipro and Flagyl will be administered orally

## 2012-10-11 NOTE — Assessment & Plan Note (Signed)
Gold stage C. COPD with mild exacerbation and associated pneumonitis around the setting of the external beam radiation therapy and lung cancer Rx. Given the localized area of colitis on CT scan and associated diarrhea will need treatment for colitis in addition to pneumonitis Note the patient is become severely debilitated Plan Take cipro twice daily for 7days for pneumonitis Take flagyl 500mg  three times a day for 10days for colitis A light weight wheelchair will be ordered from [facility] to improve mobility Return 2 months

## 2012-10-12 ENCOUNTER — Other Ambulatory Visit: Payer: Medicare Other

## 2012-10-12 ENCOUNTER — Telehealth: Payer: Self-pay | Admitting: Internal Medicine

## 2012-10-12 LAB — TYPE AND SCREEN: Unit division: 0

## 2012-10-12 NOTE — Telephone Encounter (Signed)
S.W. PT AND R.S. TODAYS LAB TO 8.4 AND SCHED oct AND NOV APPTS.Marland KitchenMarland KitchenOK AND AWARE

## 2012-10-13 ENCOUNTER — Encounter: Payer: Self-pay | Admitting: Internal Medicine

## 2012-10-13 NOTE — Patient Instructions (Signed)
CHEMOTHERAPY INTENT: Palliative  CURRENT # OF CHEMOTHERAPY CYCLES: 8  CURRENT ANTIEMETICS: Zofran, dexamethasone and Compazine  CURRENT SMOKING STATUS: Currently nonsmoker  ORAL CHEMOTHERAPY AND CONSENT: None  CURRENT BISPHOSPHONATES USE: None  PAIN MANAGEMENT: 1/10. Ibuprofen when necessary.  NARCOTICS INDUCED CONSTIPATION: No constipation  LIVING WILL AND CODE STATUS: No CODE BLUE. Followup in 3 months with repeat CT scan of the chest, abdomen and pelvis.

## 2012-10-14 ENCOUNTER — Encounter (HOSPITAL_COMMUNITY): Payer: Self-pay | Admitting: Emergency Medicine

## 2012-10-14 ENCOUNTER — Inpatient Hospital Stay (HOSPITAL_COMMUNITY)
Admission: EM | Admit: 2012-10-14 | Discharge: 2012-10-18 | DRG: 391 | Disposition: A | Discharge status: Home / Self Care | Payer: Medicare Other | Attending: Pulmonary Disease | Admitting: Pulmonary Disease

## 2012-10-14 DIAGNOSIS — K5732 Diverticulitis of large intestine without perforation or abscess without bleeding: Secondary | ICD-10-CM | POA: Diagnosis present

## 2012-10-14 DIAGNOSIS — J4489 Other specified chronic obstructive pulmonary disease: Secondary | ICD-10-CM | POA: Diagnosis present

## 2012-10-14 DIAGNOSIS — J449 Chronic obstructive pulmonary disease, unspecified: Secondary | ICD-10-CM | POA: Diagnosis present

## 2012-10-14 DIAGNOSIS — K5289 Other specified noninfective gastroenteritis and colitis: Principal | ICD-10-CM | POA: Diagnosis present

## 2012-10-14 DIAGNOSIS — E119 Type 2 diabetes mellitus without complications: Secondary | ICD-10-CM

## 2012-10-14 DIAGNOSIS — Z87891 Personal history of nicotine dependence: Secondary | ICD-10-CM

## 2012-10-14 DIAGNOSIS — R5381 Other malaise: Secondary | ICD-10-CM

## 2012-10-14 DIAGNOSIS — Z791 Long term (current) use of non-steroidal anti-inflammatories (NSAID): Secondary | ICD-10-CM

## 2012-10-14 DIAGNOSIS — E785 Hyperlipidemia, unspecified: Secondary | ICD-10-CM | POA: Diagnosis present

## 2012-10-14 DIAGNOSIS — Z9221 Personal history of antineoplastic chemotherapy: Secondary | ICD-10-CM

## 2012-10-14 DIAGNOSIS — J441 Chronic obstructive pulmonary disease with (acute) exacerbation: Secondary | ICD-10-CM

## 2012-10-14 DIAGNOSIS — Z7982 Long term (current) use of aspirin: Secondary | ICD-10-CM

## 2012-10-14 DIAGNOSIS — D6481 Anemia due to antineoplastic chemotherapy: Secondary | ICD-10-CM | POA: Diagnosis present

## 2012-10-14 DIAGNOSIS — T451X5A Adverse effect of antineoplastic and immunosuppressive drugs, initial encounter: Secondary | ICD-10-CM | POA: Diagnosis present

## 2012-10-14 DIAGNOSIS — D509 Iron deficiency anemia, unspecified: Secondary | ICD-10-CM | POA: Diagnosis present

## 2012-10-14 DIAGNOSIS — D62 Acute posthemorrhagic anemia: Secondary | ICD-10-CM | POA: Diagnosis present

## 2012-10-14 DIAGNOSIS — E876 Hypokalemia: Secondary | ICD-10-CM | POA: Diagnosis present

## 2012-10-14 DIAGNOSIS — Z79899 Other long term (current) drug therapy: Secondary | ICD-10-CM

## 2012-10-14 DIAGNOSIS — I10 Essential (primary) hypertension: Secondary | ICD-10-CM | POA: Diagnosis present

## 2012-10-14 DIAGNOSIS — D638 Anemia in other chronic diseases classified elsewhere: Secondary | ICD-10-CM | POA: Diagnosis present

## 2012-10-14 DIAGNOSIS — Z923 Personal history of irradiation: Secondary | ICD-10-CM

## 2012-10-14 DIAGNOSIS — K922 Gastrointestinal hemorrhage, unspecified: Secondary | ICD-10-CM | POA: Diagnosis present

## 2012-10-14 DIAGNOSIS — C3490 Malignant neoplasm of unspecified part of unspecified bronchus or lung: Secondary | ICD-10-CM | POA: Diagnosis present

## 2012-10-14 DIAGNOSIS — E7800 Pure hypercholesterolemia, unspecified: Secondary | ICD-10-CM | POA: Diagnosis present

## 2012-10-14 DIAGNOSIS — J9620 Acute and chronic respiratory failure, unspecified whether with hypoxia or hypercapnia: Secondary | ICD-10-CM | POA: Diagnosis present

## 2012-10-14 DIAGNOSIS — K529 Noninfective gastroenteritis and colitis, unspecified: Secondary | ICD-10-CM | POA: Diagnosis present

## 2012-10-14 DIAGNOSIS — R578 Other shock: Secondary | ICD-10-CM | POA: Diagnosis present

## 2012-10-14 LAB — CG4 I-STAT (LACTIC ACID): Lactic Acid, Venous: 3.93 mmol/L — ABNORMAL HIGH (ref 0.5–2.2)

## 2012-10-14 LAB — BLOOD GAS, ARTERIAL
Acid-Base Excess: 2 mmol/L (ref 0.0–2.0)
Patient temperature: 98.6
pCO2 arterial: 25.8 mmHg — ABNORMAL LOW (ref 35.0–45.0)
pH, Arterial: 7.574 — ABNORMAL HIGH (ref 7.350–7.450)

## 2012-10-14 LAB — CBC
HCT: 15.6 % — ABNORMAL LOW (ref 39.0–52.0)
Hemoglobin: 5 g/dL — CL (ref 13.0–17.0)
MCHC: 32.1 g/dL (ref 30.0–36.0)

## 2012-10-14 LAB — POCT I-STAT, CHEM 8
BUN: 26 mg/dL — ABNORMAL HIGH (ref 6–23)
Chloride: 104 mEq/L (ref 96–112)
HCT: 14 % — ABNORMAL LOW (ref 39.0–52.0)
Sodium: 141 mEq/L (ref 135–145)
TCO2: 24 mmol/L (ref 0–100)

## 2012-10-14 LAB — BASIC METABOLIC PANEL
BUN: 27 mg/dL — ABNORMAL HIGH (ref 6–23)
Chloride: 101 mEq/L (ref 96–112)
GFR calc non Af Amer: 85 mL/min — ABNORMAL LOW (ref >90)
Glucose, Bld: 258 mg/dL — ABNORMAL HIGH (ref 70–99)
Potassium: 3.7 mEq/L (ref 3.5–5.1)

## 2012-10-14 LAB — PREPARE RBC (CROSSMATCH)

## 2012-10-14 LAB — MRSA PCR SCREENING: MRSA by PCR: NEGATIVE

## 2012-10-14 LAB — OCCULT BLOOD, POC DEVICE: Fecal Occult Bld: POSITIVE — AB

## 2012-10-14 MED ORDER — LATANOPROST 0.005 % OP SOLN
1.0000 [drp] | Freq: Every day | OPHTHALMIC | Status: DC
  Administered 2012-10-14 – 2012-10-17 (×4): 1 [drp] via OPHTHALMIC
  Filled 2012-10-14: qty 2.5

## 2012-10-14 MED ORDER — SODIUM CHLORIDE 0.9 % IV SOLN
INTRAVENOUS | Status: DC
  Administered 2012-10-14 – 2012-10-16 (×2): via INTRAVENOUS

## 2012-10-14 MED ORDER — SODIUM CHLORIDE 0.9 % IV BOLUS (SEPSIS)
1000.0000 mL | Freq: Once | INTRAVENOUS | Status: AC
  Administered 2012-10-14: 1000 mL via INTRAVENOUS

## 2012-10-14 MED ORDER — METRONIDAZOLE IN NACL 5-0.79 MG/ML-% IV SOLN
500.0000 mg | Freq: Three times a day (TID) | INTRAVENOUS | Status: DC
  Administered 2012-10-14 – 2012-10-17 (×8): 500 mg via INTRAVENOUS
  Filled 2012-10-14 (×9): qty 100

## 2012-10-14 MED ORDER — PANTOPRAZOLE SODIUM 40 MG IV SOLR
40.0000 mg | Freq: Once | INTRAVENOUS | Status: AC
Start: 2012-10-14 — End: 2012-10-14
  Administered 2012-10-14: 40 mg via INTRAVENOUS
  Filled 2012-10-14: qty 40

## 2012-10-14 MED ORDER — ONDANSETRON HCL 4 MG/2ML IJ SOLN
4.0000 mg | Freq: Four times a day (QID) | INTRAMUSCULAR | Status: DC | PRN
Start: 2012-10-14 — End: 2012-10-18

## 2012-10-14 MED ORDER — IPRATROPIUM BROMIDE 0.02 % IN SOLN: 0.5000 mg | RESPIRATORY_TRACT | Status: DC | PRN

## 2012-10-14 MED ORDER — ALBUTEROL SULFATE (5 MG/ML) 0.5% IN NEBU: 2.5000 mg | INHALATION_SOLUTION | RESPIRATORY_TRACT | Status: DC | PRN

## 2012-10-14 MED ORDER — PANTOPRAZOLE SODIUM 40 MG IV SOLR: 40.0000 mg | Freq: Two times a day (BID) | INTRAVENOUS | Status: DC

## 2012-10-14 MED ORDER — INSULIN ASPART 100 UNIT/ML ~~LOC~~ SOLN
0.0000 [IU] | SUBCUTANEOUS | Status: DC
  Administered 2012-10-14 (×2): 3 [IU] via SUBCUTANEOUS
  Administered 2012-10-15: 2 [IU] via SUBCUTANEOUS
  Administered 2012-10-15: 1 [IU] via SUBCUTANEOUS
  Administered 2012-10-16 – 2012-10-17 (×2): 2 [IU] via SUBCUTANEOUS
  Administered 2012-10-17: 3 [IU] via SUBCUTANEOUS
  Filled 2012-10-14: qty 1

## 2012-10-14 MED ORDER — SODIUM CHLORIDE 0.9 % IV SOLN
80.0000 mg | Freq: Once | INTRAVENOUS | Status: AC
  Administered 2012-10-14: 80 mg via INTRAVENOUS
  Filled 2012-10-14: qty 80

## 2012-10-14 MED ORDER — BIOTENE DRY MOUTH MT LIQD
15.0000 mL | Freq: Two times a day (BID) | OROMUCOSAL | Status: DC
  Administered 2012-10-14 – 2012-10-18 (×8): 15 mL via OROMUCOSAL

## 2012-10-14 MED ORDER — SODIUM CHLORIDE 0.9 % IV SOLN
8.0000 mg/h | INTRAVENOUS | Status: DC
  Administered 2012-10-14 – 2012-10-15 (×2): 8 mg/h via INTRAVENOUS
  Filled 2012-10-14 (×4): qty 80

## 2012-10-14 MED ORDER — CIPROFLOXACIN IN D5W 400 MG/200ML IV SOLN
400.0000 mg | Freq: Two times a day (BID) | INTRAVENOUS | Status: DC
  Administered 2012-10-14 – 2012-10-17 (×6): 400 mg via INTRAVENOUS
  Filled 2012-10-14 (×6): qty 200

## 2012-10-14 NOTE — ED Notes (Signed)
Steven Grant, MD, attempted IV placement with ultrasound without success. States plan is to wait for critical care team to arrive.

## 2012-10-14 NOTE — H&P (Signed)
PULMONARY  / CRITICAL CARE MEDICINE  Name: [name]. MRN: [id number] DOB: 12/07/1935    ADMISSION DATE:  10/14/2012 CONSULTATION DATE:  [name]  REFERRING MD :  [name]  PRIMARY SERVICE: PCCM   CHIEF COMPLAINT:  Bloody diarrhea and weakness   BRIEF PATIENT DESCRIPTION:  This is a 68 yom w/ stage IIIB/VI NSCLC s/p last cycle of chemo on 7/8   w/ Docetaxel and GOLD stage C COPD. Presents to the ER on 8/3 w/ cc of dk bloody diarrhea w/ associated light-headedness. On presentation to ER found to be hypotensive w/ Hgb of 5. PCCM was asked to admit.  Of note had recent CT (head/chest/abd) ordered by heme/onc which showed localized colitis in terminal ileum/cecum on 7/29 for which he was started on flagyl and cipro. But did not suggest disease progression   SIGNIFICANT EVENTS / STUDIES:  CT (chest/abd/head) 7/29: 1. Mild interval contraction and central cavitation of the left lower lobe mass. 2. Perilesional pneumonitis and consolidation in the left lower  lobe is similar to prior.. No evidence of metastasis in the abdomen or pelvis.  2. New inflammation involving the terminal ileum and cecum. [name] consulted.   LINES / TUBES: Port   CULTURES: C diff 8/3>>>  ANTIBIOTICS: cipro 7/30 (started out-pt)>>> Flagyl 7/30 (started out-pt)>>>  HISTORY OF PRESENT ILLNESS:   This is a 77 yom w/ stage IIIB/VI NSCLC s/p last cycle of chemo on 7/8   w/ Docetaxel and GOLD stage C COPD. Presents to the ER on 8/3 w/ report of about 1 week of intermittent bloody stools and weakness. Then the am of 8/3 woke up reporting significant weakness f/b sudden incontinent episode of dk bloody diarrhea w/ associated light-headedness. On presentation to ER found to be hypotensive w/ Hgb of 5. PCCM was asked to admit.  PAST MEDICAL HISTORY :  Past Medical History  Diagnosis Date  . Diabetes mellitus   . HTN (hypertension)   . High cholesterol   . Hx of radiation therapy 09/22/10 - 11/05/10    LLL lung  .  History of chemotherapy   . History of radiation therapy 04/24/12-05/15/12    lllung 35Gy/14dx  . Lung cancer 2012    recurrence 12/2011   Past Surgical History  Procedure Laterality Date  . Tonsillectomy    . Gastrostomy tube placement  2011    has been removed  . Portacath placement  08/2010   Prior to Admission medications   Medication Sig Start Date End Date Taking? Authorizing Provider  albuterol (PROVENTIL) (5 MG/ML) 0.5% nebulizer solution Take 0.5 mLs (2.5 mg total) by nebulization every 4 (four) hours as needed for wheezing. 07/22/12  Yes Hollice Espy, MD  ALPRAZolam Prudy Feeler) 0.5 MG tablet Take 0.5 mg by mouth at bedtime as needed for sleep or anxiety.   Yes Historical Provider, MD  aspirin 81 MG chewable tablet Chew 1 tablet (81 mg total) by mouth daily. 07/22/12  Yes Hollice Espy, MD  atorvastatin (LIPITOR) 40 MG tablet Take 40 mg by mouth daily.   Yes Historical Provider, MD  ciprofloxacin (CIPRO) 500 MG tablet Take 1 tablet (500 mg total) by mouth 2 (two) times daily. 10/10/12  Yes Storm Frisk, MD  dexamethasone (DECADRON) 4 MG tablet Take 1 tablet (4 mg total) by mouth as directed. 2 tablets BID day before, day of, day after chemotherapy. 06/08/12  Yes Si Gaul, MD  glimepiride (AMARYL) 4 MG tablet Take 8 mg by mouth daily. 2 tabs  daily with breakfast   Yes Historical Provider, MD  lactose free nutrition (BOOST PLUS) LIQD Take 237 mLs by mouth 2 (two) times daily between meals. 07/22/12  Yes Hollice Espy, MD  latanoprost (XALATAN) 0.005 % ophthalmic solution Place 1 drop into both eyes at bedtime.    Yes Historical Provider, MD  metroNIDAZOLE (FLAGYL) 500 MG tablet Take 1 tablet (500 mg total) by mouth 3 (three) times daily. 10/10/12  Yes Storm Frisk, MD  ondansetron (ZOFRAN) 8 MG tablet Take 1 tablet (8 mg total) by mouth every 8 (eight) hours as needed (as needed for nausea and vomiting). 06/18/12  Yes Si Gaul, MD  pioglitazone (ACTOS) 30 MG tablet  Take 30 mg by mouth daily.  12/31/11  Yes Historical Provider, MD  PRESCRIPTION MEDICATION Inject 1 Syringe into the vein every 7 (seven) days. DOCEtaxel (TAXOTERE) 50 mg in sodium chloride 0.9 % 250 mL chemo infusion 25 mg/m2  1.98 m2 (Treatment Plan Actual)  Once 09/18/2012 09/18/2012. For a total of 9 treatments. Has 3 more to go. Dr. Arbutus Ped.   Yes Historical Provider, MD  prochlorperazine (COMPAZINE) 10 MG tablet Take 1 tablet (10 mg total) by mouth every 6 (six) hours as needed. 04/04/12  Yes Si Gaul, MD  sitaGLIPtin (JANUVIA) 100 MG tablet Take 100 mg by mouth daily.   Yes Historical Provider, MD  tiotropium (SPIRIVA HANDIHALER) 18 MCG inhalation capsule Place 1 capsule (18 mcg total) into inhaler and inhale daily. 02/21/12  Yes Storm Frisk, MD  ibuprofen (ADVIL,MOTRIN) 400 MG tablet Take 400 mg by mouth every 8 (eight) hours as needed for pain.    Historical Provider, MD  NON FORMULARY Pt is currently receiving CHemotherapy    Historical Provider, MD  nystatin (MYCOSTATIN) 100000 UNIT/ML suspension Take 10,000 Units by mouth 4 (four) times daily. As needed for thrush 09/07/12   Historical Provider, MD   Allergies  Allergen Reactions  . Codeine Other (See Comments)    Made pt unrepsonsive  . Penicillins     FAMILY HISTORY:  Family History  Problem Relation Age of Onset  . Heart disease Mother   . Heart disease Father   . Breast cancer Paternal Aunt    SOCIAL HISTORY:  reports that he quit smoking about 25 years ago. His smoking use included Cigarettes. He has a 136 pack-year smoking history. He does not have any smokeless tobacco history on file. He reports that he does not drink alcohol or use illicit drugs.  REVIEW OF SYSTEMS (bolds are positive):   Review of Systems  Constitutional: No weight loss, gain, night sweats, Fevers, chills, fatigue .  HEENT: No headaches, visual changes, Difficulty swallowing, Tooth/dental problems, or Sore throat,  No sneezing, itching, ear  ache, nasal congestion, post nasal drip, no visual complaints CV: No chest pain, Orthopnea, PND, swelling in lower extremities, dizziness, palpitations, syncope.  GI No heartburn, indigestion, abdominal pain, nausea, vomiting, diarrhea, change in bowel habits, loss of appetite, bloody stools.  Resp: No cough, No coughing up of blood. No change in color of mucus. No wheezing. + increase SOB Skin: no rash or itching or icterus GU: no dysuria, change in color of urine, no urgency or frequency. No flank pain, no hematuria  MS: No joint pain or swelling. No decreased range of motion  Psych: No change in mood or affect. No depression or anxiety.  Neuro: no difficulty with speech, weakness, numbness, ataxia    SUBJECTIVE:  No acute c/o when resting  VITAL  SIGNS: Temp:  [97.6 F (36.4 C)] 97.6 F (36.4 C) (08/03 1435) Pulse Rate:  [92] 92 (08/03 1435) Resp:  [22] 22 (08/03 1435) BP: (95)/(69) 95/69 mmHg (08/03 1435) SpO2:  [88 %-100 %] 100 % (08/03 1435) FiO2 (%):  [96 %] 96 % (08/03 1459) Non-re breather  HEMODYNAMICS:   VENTILATOR SETTINGS: Vent Mode:  [-]  FiO2 (%):  [96 %] 96 % INTAKE / OUTPUT: Intake/Output   None     PHYSICAL EXAMINATION: General:  Chronically ill appearing white male, pale, mild resp effort  Neuro:  Awake, oriented.  HEENT:  MM pale, neck veins flat  Cardiovascular:  rrr Lungs:  Clear  Abdomen:  Soft, no OM  Musculoskeletal:  Intact  Skin:  Dry and intact   LABS:  CBC Recent Labs     10/14/12  1510  10/14/12  1551  WBC  7.0   --   HGB  5.0*  4.8*  HCT  15.6*  14.0*  PLT  186   --    Coag's Recent Labs     10/14/12  1510  INR  1.14   BMET Recent Labs     10/14/12  1510  10/14/12  1551  NA  138  141  K  3.7  3.8  CL  101  104  CO2  25   --   BUN  27*  26*  CREATININE  0.80  0.90  GLUCOSE  258*  254*   Electrolytes Recent Labs     10/14/12  1510  CALCIUM  8.3*   Sepsis Markers No results found for this basename:  LACTICACIDVEN, PROCALCITON, O2SATVEN,  in the last 72 hours ABG No results found for this basename: PHART, PCO2ART, PO2ART,  in the last 72 hours Liver Enzymes No results found for this basename: AST, ALT, ALKPHOS, BILITOT, ALBUMIN,  in the last 72 hours Cardiac Enzymes No results found for this basename: TROPONINI, PROBNP,  in the last 72 hours Glucose No results found for this basename: GLUCAP,  in the last 72 hours  Imaging No results found.   CXR:   ASSESSMENT / PLAN:  PULMONARY A:  acute on chronic resp failure in setting of acute anemia superimposed on underlying GOLD stage C COPD, stage IIIB/VI NSCLC.  Most recent scan suggesting evidence of perilesional pneumonitis.  P:   Supplemental oxygen  Scheduled BDs Pulse ox Could trial BIPAP if needed should he have increase WOB  CARDIOVASCULAR A:  Hemorrhagic shock in setting of acute GIB.  P:  Transfuse in setting of acute blood loss.  Admit to ICU Hold asa and any antihypertensives    RENAL A:  No acute issue At risk for AKI P:   Trend chemistry Keep  euvolemic  Strict I&O  GASTROINTESTINAL A:   Acute GIB. Recent CT evidence of Colitis (terminal ileum/cecum) In setting of dark bloody stool concern would be could there also be UGI involvement   P:   NPO Hold asa GI consulted PPI gtt Transfuse   HEMATOLOGIC A:   Acute on chronic anemia: acute blood loss anemia superimposed on chemo-induced anemia/ ACD (BL hgb mid 8 to 9 range) P:  See GI section SCDs Transfuse   INFECTIOUS A:  Colitis. Not sure if infectious  P:   Send cdiff  See flow sheet above   ENDOCRINE A:   DM H/o steroids  P:   ssi Low threshold for stress dose steroids if gets hypotensive   NEUROLOGIC A:  No focal  issue  P:   Supportive care   TODAY'S SUMMARY:   This is a 22 yom w/ stage IIIB/VI NSCLC s/p last cycle of chemo on 7/8   w/ Docetaxel and GOLD stage C COPD. Presents to the ER on 8/3 w/ acute GIB and hemorrhagic  shock. Will admit to ICU, GI has been called. Will transfuse.    Pulmonary and Critical Care Medicine [facility] Pager: [phone]  10/14/2012, 4:16 PM   Reviewed above, examined pt, and agree with assessment/plan.  77 yo male recently dx with colitis and on Abx developed bloody diarrhea and melena.  Presented to ED with hypotension with increased lactic acid and profound anemia.  Has hx of COPD and lung cancer s/p chemo 3 weeks prior to admit.  Has previous endoscopy during episode of radiation esophagitis with [name] >> they have been consulted by EDP.    Will admit to ICU, continue IV fluid and PRBC transfusion, f/u Hb, start protonix infusion pending GI evaluation, and hold ASA.  Discussed goals of care with pt and his wife.  They are in agreement to continue therapies with the intent of treating his current conditions >> this should include short term intubation if needed.  He would not want long term vent support, CPR, or defibrillation.  Limited resuscitation order entered.  CC time 50 minutes.  Coralyn Helling, MD [facility] Pulmonary/Critical Care 10/14/2012, 5:26 PM Pager:  [phone] After 3pm call: [phone]

## 2012-10-14 NOTE — ED Notes (Signed)
MD at bedside. 

## 2012-10-14 NOTE — Progress Notes (Signed)
Running blood at 200 per verbal order from Dr. Craige Cotta to Roney Jaffe on previous shift.  Barnett Hatter P

## 2012-10-14 NOTE — ED Provider Notes (Signed)
CSN: [id number]     Arrival date & time 10/14/12  1421 History     First MD Initiated Contact with Patient 10/14/12 1453     Chief Complaint  Patient presents with  . GI Bleeding  . Shortness of Breath   (Consider location/radiation/quality/duration/timing/severity/associated sxs/prior Treatment) Patient is a 77 y.o. male presenting with shortness of breath and hematochezia. The history is provided by the patient and the spouse.  Shortness of Breath Severity:  Moderate Onset quality:  Gradual Associated symptoms: no abdominal pain, no fever and no vomiting   Rectal Bleeding Quality:  Maroon Amount:  Moderate Timing:  Constant Progression:  Worsening Chronicity:  New Context: diarrhea   Context: not anal fissures, not anal penetration, not rectal pain and not spontaneously   Similar prior episodes: no   Relieved by:  Nothing Worsened by:  Nothing tried Ineffective treatments:  None tried Associated symptoms: no abdominal pain, no fever and no vomiting   Risk factors: steroid use   Risk factors: no anticoagulant use, no hx of colorectal cancer, no hx of colorectal surgery and no NSAID use     Past Medical History  Diagnosis Date  . Diabetes mellitus   . HTN (hypertension)   . High cholesterol   . Hx of radiation therapy 09/22/10 - 11/05/10    LLL lung  . History of chemotherapy   . History of radiation therapy 04/24/12-05/15/12    lllung 35Gy/14dx  . Lung cancer 2012    recurrence 12/2011   Past Surgical History  Procedure Laterality Date  . Tonsillectomy    . Gastrostomy tube placement  2011    has been removed  . Portacath placement  08/2010   Family History  Problem Relation Age of Onset  . Heart disease Mother   . Heart disease Father   . Breast cancer Paternal Aunt    History  Substance Use Topics  . Smoking status: Former Smoker -- 4.00 packs/day for 34 years    Types: Cigarettes    Quit date: 03/15/1987  . Smokeless tobacco: Not on file  . Alcohol Use:  No    Review of Systems  Constitutional: Negative for fever.  Respiratory: Positive for shortness of breath.   Gastrointestinal: Positive for hematochezia. Negative for vomiting and abdominal pain.  All other systems reviewed and are negative.    Allergies  Codeine and Penicillins  Home Medications   Current Outpatient Rx  Name  Route  Sig  Dispense  Refill  . albuterol (PROVENTIL) (5 MG/ML) 0.5% nebulizer solution   Nebulization   Take 0.5 mLs (2.5 mg total) by nebulization every 4 (four) hours as needed for wheezing.   20 mL   3   . ALPRAZolam (XANAX) 0.5 MG tablet   Oral   Take 0.5 mg by mouth at bedtime as needed for sleep or anxiety.         Marland Kitchen aspirin 81 MG chewable tablet   Oral   Chew 1 tablet (81 mg total) by mouth daily.         Marland Kitchen atorvastatin (LIPITOR) 40 MG tablet   Oral   Take 40 mg by mouth daily.         . ciprofloxacin (CIPRO) 500 MG tablet   Oral   Take 1 tablet (500 mg total) by mouth 2 (two) times daily.   14 tablet   0   . dexamethasone (DECADRON) 4 MG tablet   Oral   Take 1 tablet (4 mg total) by  mouth as directed. 2 tablets BID day before, day of, day after chemotherapy.   40 tablet   1   . glimepiride (AMARYL) 4 MG tablet   Oral   Take 8 mg by mouth daily. 2 tabs daily with breakfast         . lactose free nutrition (BOOST PLUS) LIQD   Oral   Take 237 mLs by mouth 2 (two) times daily between meals.         . latanoprost (XALATAN) 0.005 % ophthalmic solution   Both Eyes   Place 1 drop into both eyes at bedtime.          . metroNIDAZOLE (FLAGYL) 500 MG tablet   Oral   Take 1 tablet (500 mg total) by mouth 3 (three) times daily.   30 tablet   0   . ondansetron (ZOFRAN) 8 MG tablet   Oral   Take 1 tablet (8 mg total) by mouth every 8 (eight) hours as needed (as needed for nausea and vomiting).   30 tablet   1   . pioglitazone (ACTOS) 30 MG tablet   Oral   Take 30 mg by mouth daily.          Marland Kitchen PRESCRIPTION  MEDICATION   Intravenous   Inject 1 Syringe into the vein every 7 (seven) days. DOCEtaxel (TAXOTERE) 50 mg in sodium chloride 0.9 % 250 mL chemo infusion 25 mg/m2  1.98 m2 (Treatment Plan Actual)  Once 09/18/2012 09/18/2012. For a total of 9 treatments. Has 3 more to go. Dr. Arbutus Ped.         . prochlorperazine (COMPAZINE) 10 MG tablet   Oral   Take 1 tablet (10 mg total) by mouth every 6 (six) hours as needed.   30 tablet   0   . sitaGLIPtin (JANUVIA) 100 MG tablet   Oral   Take 100 mg by mouth daily.         Marland Kitchen tiotropium (SPIRIVA HANDIHALER) 18 MCG inhalation capsule   Inhalation   Place 1 capsule (18 mcg total) into inhaler and inhale daily.   30 capsule   6   . ibuprofen (ADVIL,MOTRIN) 400 MG tablet   Oral   Take 400 mg by mouth every 8 (eight) hours as needed for pain.         . NON FORMULARY      Pt is currently receiving CHemotherapy         . nystatin (MYCOSTATIN) 100000 UNIT/ML suspension   Oral   Take 10,000 Units by mouth 4 (four) times daily. As needed for thrush          BP 95/69  Pulse 92  Temp(Src) 97.6 F (36.4 C) (Oral)  Resp 22  SpO2 100% Physical Exam  Nursing note and vitals reviewed. Constitutional: He is oriented to person, place, and time. He appears well-developed and well-nourished. No distress.  HENT:  Head: Normocephalic and atraumatic.  Mouth/Throat: No oropharyngeal exudate.  Eyes: EOM are normal. Pupils are equal, round, and reactive to light.  Neck: Normal range of motion. Neck supple.  Cardiovascular: Normal rate and regular rhythm.  Exam reveals no friction rub.   No murmur heard. Pulmonary/Chest: Breath sounds normal. He is in respiratory distress (mild). He has no wheezes. He has no rales.  Decreased air movement diffusely  Abdominal: He exhibits no distension. There is no tenderness. There is no rebound.  Musculoskeletal: Normal range of motion. He exhibits no edema.  Neurological: He is alert and  oriented to person,  place, and time.  Skin: He is not diaphoretic.    ED Course   Procedures (including critical care time)  Labs Reviewed  CBC  BASIC METABOLIC PANEL  PROTIME-INR  LACTIC ACID, PLASMA  BLOOD GAS, VENOUS  PREPARE RBC (CROSSMATCH)  TYPE AND SCREEN   No results found. 1. GI bleed   2. Acute blood loss anemia   3. Colitis   4. Diabetes   5. Hemorrhagic shock   6. Obstructive chronic bronchitis with exacerbation   7. Lung cancer, unspecified laterality     MDM   The patient is a 77 year old male with history of stage IV lung cancer, with receiving chemotherapy weeks ago presents with GI bleeding. No history of GI bleeding previously. Had diarrhea with dark blood mixed with stool this morning. No history of prior peptic ulcer disease. Patient did receive a blood transfusion earlier this week due to being weak. He is on 2 after 3 L of oxygen at baseline. On arrival, patient still hypoxic at 75%. He is placed on 15 L nonrebreather with good O2 sats afterwards. Is in sinus rhythm on monitor with pulse in the 90s. Blood pressures mildly low 95 or 6 not on arrival. The nurse accessed Port-A-Cath inserted Mr. fluids. Patient is lethargic, he will wake up and talk to you however quickly falls asleep. His wife says this is normal for him to be very sleepy like this. Patient denies any belly pain and has a normal abdominal exam. He is Hemoccult positive. Labs were sent including a lactate, blood gas, type and screen. Patient will require ICU admission. I did counsel the wife has to he is very sick she states that he does not want to be resuscitated, however she is not fully filled out the paperwork. He does not need an emergent airway at this time so we will readdress if he decompensates. 3:58 PM Hgb 4.8. Consulting critical care and GI. Critical care admitting. Lactate elevated also at 3.93. Blood ordered.   Dagmar Hait, MD 10/14/12 2117

## 2012-10-14 NOTE — ED Notes (Signed)
Patient denies backache, chills, itching, burning, or any change in feeling from prior to blood administration.

## 2012-10-14 NOTE — ED Notes (Addendum)
Per EMS report patient with Hx of lung cancer had a "significant amount of diarrhea with a significant amount of dark blood in it." EMS reports that when patient sits upright he gets weak and lethargic. EMS reports that patient is at baseline mental status with exception of lethargic episodes. Family reports that patient received a blood transfusion Thursday.

## 2012-10-14 NOTE — Consult Note (Addendum)
[facility] CONSULT Reason for consult: G.I. bleeding Referring Physician: CCM. PCP: Dr. Jeannetta Nap Oncologist: Dr Sofie Hartigan. Primary G.I.: Dr. Ewing Schlein. Pulmonary: Dr. Cherre Blanc T Mays Montez Hageman. is an 77 y.o. male.  HPI: the patient has staged 3B/4 non-small cell lung cancer, squamous cell cancer diagnosed in 2012. In 2012 he underwent radiation and chemotherapy. He had a gastrostomy tube temporarily placed it was subsequently removed. In 2012 he had odynophagia and severe radiation esophagitis by EGD. He apparently has had the cancer or occur and has been undergoing a 2nd round of chemotherapy and radiation therapy by Dr. Sofie Hartigan. He had a recent CT scan order by Dr. Delford Field, and this apparently showed diverticulitis in the patient was started in Cipro Flagyl. He said today with his wife and notes that he had a small amount of rectal bleeding that he thought was hemorrhoids for 1 to 2 days and today developed frank bloody diarrhea and became lightheaded. He came to the emergency room and was found to have hemoglobin 4.8 BUN 26 creatinine 0.9. Patient denies any dyspepsia or prior history of ulcers. He denies abdominal pain or preceding melena. Past Medical History  Diagnosis Date  . Diabetes mellitus   . HTN (hypertension)   . High cholesterol   . Hx of radiation therapy 09/22/10 - 11/05/10    LLL lung  . History of chemotherapy   . History of radiation therapy 04/24/12-05/15/12    lllung 35Gy/14dx  . Lung cancer 2012    recurrence 12/2011    Past Surgical History  Procedure Laterality Date  . Tonsillectomy    . Gastrostomy tube placement  2011    has been removed  . Portacath placement  08/2010    Family History  Problem Relation Age of Onset  . Heart disease Mother   . Heart disease Father   . Breast cancer Paternal Aunt     Social History:  reports that he quit smoking about 25 years ago. His smoking use included Cigarettes. He has a 136 pack-year smoking history. He does not have  any smokeless tobacco history on file. He reports that he does not drink alcohol or use illicit drugs.  Allergies:  Allergies  Allergen Reactions  . Codeine Other (See Comments)    Made pt unrepsonsive  . Penicillins     Medications; . antiseptic oral rinse  15 mL Mouth Rinse BID  . insulin aspart  0-15 Units Subcutaneous Q4H  . latanoprost  1 drop Both Eyes QHS  . [START ON 10/18/2012] pantoprazole (PROTONIX) IV  40 mg Intravenous Q12H   PRN Meds albuterol, ipratropium, ondansetron (ZOFRAN) IV Results for orders placed during the hospital encounter of 10/14/12 (from the past 48 hour(s))  TYPE AND SCREEN     Status: None   Collection Time    10/14/12  3:09 PM      Result Value Range   ABO/RH(D) A POS     Antibody Screen NEG     Sample Expiration 10/17/2012     Unit Number [id number]     Blood Component Type RBC, LR IRR     Unit division 00     Status of Unit ALLOCATED     Transfusion Status OK TO TRANSFUSE     Crossmatch Result Compatible     Unit Number [id number]     Blood Component Type RBC, LR IRR     Unit division 00     Status of Unit ISSUED     Transfusion Status OK TO  TRANSFUSE     Crossmatch Result Compatible    CBC     Status: Abnormal   Collection Time    10/14/12  3:10 PM      Result Value Range   WBC 7.0  4.0 - 10.5 K/uL   RBC 1.59 (*) 4.22 - 5.81 MIL/uL   Hemoglobin 5.0 (*) 13.0 - 17.0 g/dL   Comment: RESULT REPEATED AND VERIFIED     CRITICAL RESULT CALLED TO, READ BACK BY AND VERIFIED WITH:     CONNOR,K AT 1535 ON [id number] BY HOOKER,B   HCT 15.6 (*) 39.0 - 52.0 %   MCV 98.1  78.0 - 100.0 fL   MCH 31.4  26.0 - 34.0 pg   MCHC 32.1  30.0 - 36.0 g/dL   RDW 04.5 (*) 40.9 - 81.1 %   Platelets 186  150 - 400 K/uL  BASIC METABOLIC PANEL     Status: Abnormal   Collection Time    10/14/12  3:10 PM      Result Value Range   Sodium 138  135 - 145 mEq/L   Potassium 3.7  3.5 - 5.1 mEq/L   Chloride 101  96 - 112 mEq/L   CO2 25  19 - 32 mEq/L   Glucose,  Bld 258 (*) 70 - 99 mg/dL   BUN 27 (*) 6 - 23 mg/dL   Creatinine, Ser 9.14  0.50 - 1.35 mg/dL   Calcium 8.3 (*) 8.4 - 10.5 mg/dL   GFR calc non Af Amer 85 (*) >90 mL/min   GFR calc Af Amer >90  >90 mL/min   Comment:            The eGFR has been calculated     using the CKD EPI equation.     This calculation has not been     validated in all clinical     situations.     eGFR's persistently     <90 mL/min signify     possible Chronic Kidney Disease.  PROTIME-INR     Status: None   Collection Time    10/14/12  3:10 PM      Result Value Range   Prothrombin Time 14.4  11.6 - 15.2 seconds   INR 1.14  0.00 - 1.49  OCCULT BLOOD, POC DEVICE     Status: Abnormal   Collection Time    10/14/12  3:47 PM      Result Value Range   Fecal Occult Bld POSITIVE (*) NEGATIVE  POCT I-STAT, CHEM 8     Status: Abnormal   Collection Time    10/14/12  3:51 PM      Result Value Range   Sodium 141  135 - 145 mEq/L   Potassium 3.8  3.5 - 5.1 mEq/L   Chloride 104  96 - 112 mEq/L   BUN 26 (*) 6 - 23 mg/dL   Creatinine, Ser 7.82  0.50 - 1.35 mg/dL   Glucose, Bld 956 (*) 70 - 99 mg/dL   Calcium, Ion 2.13 (*) 1.13 - 1.30 mmol/L   TCO2 24  0 - 100 mmol/L   Hemoglobin 4.8 (*) 13.0 - 17.0 g/dL   HCT 08.6 (*) 57.8 - 46.9 %   Comment NOTIFIED PHYSICIAN    CG4 I-STAT (LACTIC ACID)     Status: Abnormal   Collection Time    10/14/12  3:51 PM      Result Value Range   Lactic Acid, Venous 3.93 (*) 0.5 - 2.2 mmol/L  BLOOD GAS, ARTERIAL     Status: Abnormal   Collection Time    10/14/12  4:16 PM      Result Value Range   FIO2 1.00     pH, Arterial 7.574 (*) 7.350 - 7.450   pCO2 arterial 25.8 (*) 35.0 - 45.0 mmHg   pO2, Arterial 179.0 (*) 80.0 - 100.0 mmHg   Bicarbonate 24.0  20.0 - 24.0 mEq/L   TCO2 22.9  0 - 100 mmol/L   Acid-Base Excess 2.0  0.0 - 2.0 mmol/L   O2 Saturation 99.8     Patient temperature 98.6     Collection site RIGHT RADIAL     Drawn by COLLECTED BY RT     Sample type ARTERIAL DRAW      Allens test (pass/fail) PASS  PASS    No results found.             Blood pressure 95/69, pulse 92, temperature 97.6 F (36.4 C), temperature source Oral, resp. rate 22, SpO2 100.00%.  Physical exam:   General-- elderly white male very short of breath Heart-- tachycardia  Lungs--very dyspnea Abdomen-- soft and nontender   Assessment: 1. GI Bleed. This is probably a lower G.I. bleeding based on his history and only slight elevation of BUN. His blood pressure currently looks okay. He had some type of colon evaluation years ago. Unable to find any results in epic or ECW. 2. Stage IIIB/IV squamous cell lung cancer. Patient is in his 2nd course of chemotherapy and radiation therapy. He was previously treated about 2 years ago. 3. Recent diverticulitis by CT criteria. Patient was started on antibiotics. This all could be a diverticular bleed.  Plan: 1. Would begin empiric PPI therapy in case this is the upper G.I. source. After he's been adequately transfuse, we will assess and see whether EGD as needed. 2. Would continue antibiotics for possible diverticulitis suggested by CT scan.  We will follow with you   Saniyah Mondesir JR,Joby Hershkowitz L 10/14/2012, 5:42 PM     review of the CT scan shows that he has diverticulitis in the right: with right colon as well as the left with inflammation in the right side.

## 2012-10-14 NOTE — Progress Notes (Signed)
ANTIBIOTIC CONSULT NOTE - INITIAL  Pharmacy Consult for Ciprofloxacin Indication: colitis  Allergies  Allergen Reactions  . Codeine Other (See Comments)    Made pt unrepsonsive  . Penicillins     Patient Measurements:   Adjusted Body Weight:   Vital Signs: Temp: 97.6 F (36.4 C) (08/03 1435) Temp src: Oral (08/03 1435) BP: 95/69 mmHg (08/03 1435) Pulse Rate: 92 (08/03 1435) Intake/Output from previous day:   Intake/Output from this shift:    Labs:  Recent Labs  10/14/12 1510 10/14/12 1551  WBC 7.0  --   HGB 5.0* 4.8*  PLT 186  --   CREATININE 0.80 0.90   The CrCl is unknown because both a height and weight (above a minimum accepted value) are required for this calculation. No results found for this basename: VANCOTROUGH, VANCOPEAK, VANCORANDOM, GENTTROUGH, GENTPEAK, GENTRANDOM, TOBRATROUGH, TOBRAPEAK, TOBRARND, AMIKACINPEAK, AMIKACINTROU, AMIKACIN,  in the last 72 hours   Microbiology: No results found for this or any previous visit (from the past 720 hour(s)).  Medical History: Past Medical History  Diagnosis Date  . Diabetes mellitus   . HTN (hypertension)   . High cholesterol   . Hx of radiation therapy 09/22/10 - 11/05/10    LLL lung  . History of chemotherapy   . History of radiation therapy 04/24/12-05/15/12    lllung 35Gy/14dx  . Lung cancer 2012    recurrence 12/2011     Assessment:  11 yom with h/o NSCLC, received last dose of Docetaxel 7/15 presented 8/3 with bloody stools and generalized weakness.  CCM on board. Patient was started on Flagyl/Cipro 7/29 for localized colitis in terminal ileum/cecum on CT.  MD ordered to resume Cipro/Flagyl.    Scr 0.9, afebrile, WBC wnl   Plan:   Ciprofloxacin 400 mg IV q12h  Continue Flagyl as ordered by MD  Pharmacy will f/u  Geoffry Paradise, PharmD, BCPS Pager: [phone] 5:08 PM Pharmacy #: [id number]

## 2012-10-15 ENCOUNTER — Encounter: Payer: Self-pay | Admitting: *Deleted

## 2012-10-15 ENCOUNTER — Other Ambulatory Visit: Payer: Medicare Other

## 2012-10-15 LAB — GLUCOSE, CAPILLARY
Glucose-Capillary: 142 mg/dL — ABNORMAL HIGH (ref 70–99)
Glucose-Capillary: 85 mg/dL (ref 70–99)
Glucose-Capillary: 122 mg/dL — ABNORMAL HIGH (ref 70–99)
Glucose-Capillary: 122 mg/dL — ABNORMAL HIGH (ref 70–99)
Glucose-Capillary: 104 mg/dL — ABNORMAL HIGH (ref 70–99)

## 2012-10-15 LAB — HEMOGLOBIN AND HEMATOCRIT, BLOOD
HCT: 22.6 % — ABNORMAL LOW (ref 39.0–52.0)
Hemoglobin: 8.6 g/dL — ABNORMAL LOW (ref 13.0–17.0)

## 2012-10-15 LAB — CLOSTRIDIUM DIFFICILE BY PCR: Toxigenic C. Difficile by PCR: NEGATIVE

## 2012-10-15 LAB — BASIC METABOLIC PANEL
Calcium: 7.9 mg/dL — ABNORMAL LOW (ref 8.4–10.5)
Chloride: 108 mEq/L (ref 96–112)
Creatinine, Ser: 0.88 mg/dL (ref 0.50–1.35)
GFR calc Af Amer: >90 mL/min (ref >90)
GFR calc non Af Amer: 82 mL/min — ABNORMAL LOW (ref >90)

## 2012-10-15 LAB — PREPARE RBC (CROSSMATCH)

## 2012-10-15 LAB — CBC
HCT: 27.1 % — ABNORMAL LOW (ref 39.0–52.0)
Platelets: 130 10*3/uL — ABNORMAL LOW (ref 150–400)
RDW: 15.7 % — ABNORMAL HIGH (ref 11.5–15.5)
WBC: 6 10*3/uL (ref 4.0–10.5)

## 2012-10-15 MED ORDER — DEXTROSE 50 % IV SOLN
INTRAVENOUS | Status: AC
  Administered 2012-10-15: 25 mL
  Filled 2012-10-15: qty 50

## 2012-10-15 MED ORDER — POTASSIUM CHLORIDE 10 MEQ/50ML IV SOLN
10.0000 meq | INTRAVENOUS | Status: AC
  Administered 2012-10-15 (×2): 10 meq via INTRAVENOUS
  Filled 2012-10-15: qty 100

## 2012-10-15 MED ORDER — PANTOPRAZOLE SODIUM 40 MG IV SOLR
40.0000 mg | Freq: Two times a day (BID) | INTRAVENOUS | Status: DC
  Administered 2012-10-15 – 2012-10-17 (×4): 40 mg via INTRAVENOUS
  Filled 2012-10-15 (×5): qty 40

## 2012-10-15 NOTE — Progress Notes (Signed)
[facility] Progress Note  Subjective: The patient is feeling much better today as compared to yesterday. He denies abdominal pain. He denies vomiting or hematemesis. He did pass some blood per rectum today according to the nurse that it looked like old blood. According to the wife the bleeding yesterday was bright red but the stool was dark.  Objective: Vital signs in last 24 hours: Temp:  [97.2 F (36.2 C)-98.6 F (37 C)] 98.2 F (36.8 C) (08/04 0400) Pulse Rate:  [27-96] 76 (08/04 0800) Resp:  [10-29] 17 (08/04 0800) BP: (81-108)/(26-69) 104/42 mmHg (08/04 0800) SpO2:  [88 %-100 %] 100 % (08/04 0800) FiO2 (%):  [96 %-100 %] 100 % (08/04 0600) Weight:  [77.202 kg (170 lb 3.2 oz)-78.3 kg (172 lb 9.9 oz)] 77.202 kg (170 lb 3.2 oz) (08/04 0430) Weight change:    PE:  He is in no distress  Heart regular rhythm  Abdomen soft and nontender and  Lab Results: Results for orders placed during the hospital encounter of 10/14/12 (from the past 24 hour(s))  TYPE AND SCREEN     Status: None   Collection Time    10/14/12  3:09 PM      Result Value Range   ABO/RH(D) A POS     Antibody Screen NEG     Sample Expiration 10/17/2012     Unit Number [id number]     Blood Component Type RBC, LR IRR     Unit division 00     Status of Unit ISSUED,FINAL     Transfusion Status OK TO TRANSFUSE     Crossmatch Result Compatible     Unit Number [id number]     Blood Component Type RBC, LR IRR     Unit division 00     Status of Unit ISSUED,FINAL     Transfusion Status OK TO TRANSFUSE     Crossmatch Result Compatible     Unit Number [id number]     Blood Component Type RBC, LR IRR     Unit division 00     Status of Unit ISSUED     Transfusion Status OK TO TRANSFUSE     Crossmatch Result Compatible    CBC     Status: Abnormal   Collection Time    10/14/12  3:10 PM      Result Value Range   WBC 7.0  4.0 - 10.5 K/uL   RBC 1.59 (*) 4.22 - 5.81 MIL/uL   Hemoglobin 5.0 (*)  13.0 - 17.0 g/dL   HCT 72.5 (*) 36.6 - 44.0 %   MCV 98.1  78.0 - 100.0 fL   MCH 31.4  26.0 - 34.0 pg   MCHC 32.1  30.0 - 36.0 g/dL   RDW 34.7 (*) 42.5 - 95.6 %   Platelets 186  150 - 400 K/uL  BASIC METABOLIC PANEL     Status: Abnormal   Collection Time    10/14/12  3:10 PM      Result Value Range   Sodium 138  135 - 145 mEq/L   Potassium 3.7  3.5 - 5.1 mEq/L   Chloride 101  96 - 112 mEq/L   CO2 25  19 - 32 mEq/L   Glucose, Bld 258 (*) 70 - 99 mg/dL   BUN 27 (*) 6 - 23 mg/dL   Creatinine, Ser 3.87  0.50 - 1.35 mg/dL   Calcium 8.3 (*) 8.4 - 10.5 mg/dL   GFR calc non Af Amer 85 (*) >90 mL/min  GFR calc Af Amer >90  >90 mL/min  PROTIME-INR     Status: None   Collection Time    10/14/12  3:10 PM      Result Value Range   Prothrombin Time 14.4  11.6 - 15.2 seconds   INR 1.14  0.00 - 1.49  PREPARE RBC (CROSSMATCH)     Status: None   Collection Time    10/14/12  3:24 PM      Result Value Range   Order Confirmation ORDER PROCESSED BY BLOOD BANK    OCCULT BLOOD, POC DEVICE     Status: Abnormal   Collection Time    10/14/12  3:47 PM      Result Value Range   Fecal Occult Bld POSITIVE (*) NEGATIVE  POCT I-STAT, CHEM 8     Status: Abnormal   Collection Time    10/14/12  3:51 PM      Result Value Range   Sodium 141  135 - 145 mEq/L   Potassium 3.8  3.5 - 5.1 mEq/L   Chloride 104  96 - 112 mEq/L   BUN 26 (*) 6 - 23 mg/dL   Creatinine, Ser 5.78  0.50 - 1.35 mg/dL   Glucose, Bld 469 (*) 70 - 99 mg/dL   Calcium, Ion 6.29 (*) 1.13 - 1.30 mmol/L   TCO2 24  0 - 100 mmol/L   Hemoglobin 4.8 (*) 13.0 - 17.0 g/dL   HCT 52.8 (*) 41.3 - 24.4 %   Comment NOTIFIED PHYSICIAN    CG4 I-STAT (LACTIC ACID)     Status: Abnormal   Collection Time    10/14/12  3:51 PM      Result Value Range   Lactic Acid, Venous 3.93 (*) 0.5 - 2.2 mmol/L  BLOOD GAS, ARTERIAL     Status: Abnormal   Collection Time    10/14/12  4:16 PM      Result Value Range   FIO2 1.00     pH, Arterial 7.574 (*) 7.350 -  7.450   pCO2 arterial 25.8 (*) 35.0 - 45.0 mmHg   pO2, Arterial 179.0 (*) 80.0 - 100.0 mmHg   Bicarbonate 24.0  20.0 - 24.0 mEq/L   TCO2 22.9  0 - 100 mmol/L   Acid-Base Excess 2.0  0.0 - 2.0 mmol/L   O2 Saturation 99.8     Patient temperature 98.6     Collection site RIGHT RADIAL     Drawn by COLLECTED BY RT     Sample type ARTERIAL DRAW     Allens test (pass/fail) PASS  PASS  GLUCOSE, CAPILLARY     Status: Abnormal   Collection Time    10/14/12  5:47 PM      Result Value Range   Glucose-Capillary 179 (*) 70 - 99 mg/dL  MRSA PCR SCREENING     Status: None   Collection Time    10/14/12  6:06 PM      Result Value Range   MRSA by PCR NEGATIVE  NEGATIVE  CLOSTRIDIUM DIFFICILE BY PCR     Status: None   Collection Time    10/14/12  8:07 PM      Result Value Range   C difficile by pcr NEGATIVE  NEGATIVE  GLUCOSE, CAPILLARY     Status: Abnormal   Collection Time    10/14/12  8:54 PM      Result Value Range   Glucose-Capillary 173 (*) 70 - 99 mg/dL  HEMOGLOBIN AND HEMATOCRIT, BLOOD  Status: Abnormal   Collection Time    10/14/12 11:45 PM      Result Value Range   Hemoglobin 7.6 (*) 13.0 - 17.0 g/dL   HCT 40.9 (*) 81.1 - 91.4 %  GLUCOSE, CAPILLARY     Status: Abnormal   Collection Time    10/14/12 11:46 PM      Result Value Range   Glucose-Capillary 142 (*) 70 - 99 mg/dL  PREPARE RBC (CROSSMATCH)     Status: None   Collection Time    10/15/12  1:00 AM      Result Value Range   Order Confirmation ORDER PROCESSED BY BLOOD BANK    GLUCOSE, CAPILLARY     Status: None   Collection Time    10/15/12  3:36 AM      Result Value Range   Glucose-Capillary 85  70 - 99 mg/dL   Comment 1 Notify RN    BASIC METABOLIC PANEL     Status: Abnormal   Collection Time    10/15/12  5:00 AM      Result Value Range   Sodium 143  135 - 145 mEq/L   Potassium 3.3 (*) 3.5 - 5.1 mEq/L   Chloride 108  96 - 112 mEq/L   CO2 27  19 - 32 mEq/L   Glucose, Bld 79  70 - 99 mg/dL   BUN 23  6 - 23  mg/dL   Creatinine, Ser 7.82  0.50 - 1.35 mg/dL   Calcium 7.9 (*) 8.4 - 10.5 mg/dL   GFR calc non Af Amer 82 (*) >90 mL/min   GFR calc Af Amer >90  >90 mL/min  CBC     Status: Abnormal   Collection Time    10/15/12  5:00 AM      Result Value Range   WBC 6.0  4.0 - 10.5 K/uL   RBC 2.92 (*) 4.22 - 5.81 MIL/uL   Hemoglobin 9.1 (*) 13.0 - 17.0 g/dL   HCT 95.6 (*) 21.3 - 08.6 %   MCV 92.8  78.0 - 100.0 fL   MCH 31.2  26.0 - 34.0 pg   MCHC 33.6  30.0 - 36.0 g/dL   RDW 57.8 (*) 46.9 - 62.9 %   Platelets 130 (*) 150 - 400 K/uL  GLUCOSE, CAPILLARY     Status: Abnormal   Collection Time    10/15/12  7:51 AM      Result Value Range   Glucose-Capillary 65 (*) 70 - 99 mg/dL  GLUCOSE, CAPILLARY     Status: Abnormal   Collection Time    10/15/12  7:55 AM      Result Value Range   Glucose-Capillary 68 (*) 70 - 99 mg/dL   Comment 1 Notify RN     Comment 2 Documented in Chart      Studies/Results: @[id number] @    Assessment: Gastrointestinal bleeding  Inflammation on CT scan noted in the region of the terminal ileum and cecum the etiology of this is unclear. It could be infectious or ischemic. He is on antibiotics.    Plan: Given the fact that his hemoglobin and hematocrit have responded appropriately to blood transfusions I would recommend continued observation. It does not appear that he is continuing to have significant bleeding at this time. Given the fact also that his BUN is normal this is not suggestive of an upper GI bleed but more so a lower GI bleed and this is obviously supported by the abnormality seen on CT scan.  I think we can give him clear liquids today. I do not think he needs EGD.    Graylin Shiver 10/15/2012, 9:43 AM  Lab Results  Component Value Date   HGB 9.1* 10/15/2012   HGB 7.6* 10/14/2012   HGB 4.8* 10/14/2012   HGB 8.7* 10/09/2012   HGB 9.6* 09/25/2012   HGB 9.4* 09/18/2012   HCT 27.1* 10/15/2012   HCT 22.6* 10/14/2012   HCT 14.0* 10/14/2012   HCT 25.7* 10/09/2012    HCT 28.7* 09/25/2012   HCT 28.7* 09/18/2012   ALKPHOS 70 10/09/2012   ALKPHOS 62 09/25/2012   ALKPHOS 56 09/18/2012   ALKPHOS 77 07/12/2012   ALKPHOS 136* 07/11/2012   ALKPHOS 48 10/19/2011   ALKPHOS 60 07/14/2011   ALKPHOS 63 04/08/2011   ALKPHOS 73 12/08/2010   AST 11 10/09/2012   AST 9 09/25/2012   AST 10 09/18/2012   AST 12 07/12/2012   AST 24 07/11/2012   AST 19 10/19/2011   AST 16 07/14/2011   AST 15 04/08/2011   AST 17 12/08/2010   ALT 10 10/09/2012   ALT 13 09/25/2012   ALT 12 09/18/2012   ALT 13 07/12/2012   ALT 18 07/11/2012   ALT 26 10/19/2011   ALT 13 07/14/2011   ALT 14 04/08/2011   ALT 15 12/08/2010

## 2012-10-15 NOTE — Progress Notes (Signed)
Spoke with pt and wife at [facility].  Brief pt teaching given

## 2012-10-15 NOTE — Progress Notes (Deleted)
CARE MANAGEMENT NOTE 10/15/2012  Patient:  [name], [name]   Account Number:  [id number]  Date Initiated:  10/15/2012  Documentation initiated by:  Afshin Chrystal  Subjective/Objective Assessment:   pt admitted with dyspnea and o2 sat of 88% admitted and placed on non rebreather at 40%,     Action/Plan:   home on auto titeration bipap   Anticipated DC Date:  10/15/2012   Anticipated DC Plan:  HOME W HOME HEALTH SERVICES  In-house referral  NA      DC Planning Services  CM consult      [facility] Choice  DURABLE MEDICAL EQUIPMENT   Choice offered to / List presented to:  C-1 Patient   DME arranged  BIPAP      DME agency  [facility]     HH arranged  NA      HH agency  NA   Status of service:  In process, will continue to follow Medicare Important Message given?  NA - LOS <3 / Initial given by admissions (If response is "NO", the following Medicare IM given date fields will be blank) Date Medicare IM given:   Date Additional Medicare IM given:    Discharge Disposition:    Per UR Regulation:  Reviewed for med. necessity/level of care/duration of stay  If discussed at Long Length of Stay Meetings, dates discussed:    Comments:   [id number] Earlene Plater RN, BSN, CCN: [phone] Case management. Chart reviewed for discharge planning and present needs. Discharge needs:Atuo titreation bipap/  [facility] notified. Next chart review due:  [id number]

## 2012-10-15 NOTE — Progress Notes (Signed)
PULMONARY  / CRITICAL CARE MEDICINE  Name: [name]. MRN: [id number] DOB: 03-May-1935    ADMISSION DATE:  10/14/2012 CONSULTATION DATE:  [name]  REFERRING MD :  [name]  PRIMARY SERVICE: PCCM   CHIEF COMPLAINT:  Bloody diarrhea and weakness   BRIEF PATIENT DESCRIPTION:  This is a 43 yom w/ stage IIIB/VI NSCLC s/p last cycle of chemo on 7/8   w/ Docetaxel and GOLD stage C COPD. Presents to the ER on 8/3 w/ cc of dk bloody diarrhea w/ associated light-headedness. On presentation to ER found to be hypotensive w/ Hgb of 5. PCCM was asked to admit.  Of note had recent CT (head/chest/abd) ordered by heme/onc which showed localized colitis in terminal ileum/cecum on 7/29 for which he was started on flagyl and cipro. But did not suggest disease progression   SIGNIFICANT EVENTS / STUDIES:  CT (chest/abd/head) 7/29: 1. Mild interval contraction and central cavitation of the left lower lobe mass. 2. Perilesional pneumonitis and consolidation in the left lower  lobe is similar to prior.. No evidence of metastasis in the abdomen or pelvis.  2. New inflammation involving the terminal ileum and cecum. [name] consulted.   LINES / TUBES: Port   CULTURES: C diff 8/3>>>  ANTIBIOTICS: cipro 7/30 (started out-pt)>>> Flagyl 7/30 (started out-pt)>>>    SUBJECTIVE:  No acute c/o when resting  VITAL SIGNS: Temp:  [97.2 F (36.2 C)-98.6 F (37 C)] 98.2 F (36.8 C) (08/04 0400) Pulse Rate:  [27-96] 82 (08/04 0600) Resp:  [10-29] 14 (08/04 0600) BP: (81-108)/(26-69) 102/44 mmHg (08/04 0600) SpO2:  [88 %-100 %] 100 % (08/04 0600) FiO2 (%):  [96 %-100 %] 100 % (08/04 0600) Weight:  [77.202 kg (170 lb 3.2 oz)-78.3 kg (172 lb 9.9 oz)] 77.202 kg (170 lb 3.2 oz) (08/04 0430) 3.5 liters  HEMODYNAMICS:   VENTILATOR SETTINGS: Vent Mode:  [-]  FiO2 (%):  [96 %-100 %] 100 % INTAKE / OUTPUT: Intake/Output     08/03 0701 - 08/04 0700 08/04 0701 - 08/05 0700   I.V. (mL/kg) 1728.8 (22.4)    Blood  1050    IV Piggyback 450    Total Intake(mL/kg) 3228.8 (41.8)    Net +3228.8          Urine Occurrence 2 x    Stool Occurrence 1 x      PHYSICAL EXAMINATION: General:  Chronically ill appearing white male, pale, less distress  Neuro:  Awake, oriented.  HEENT:  MM pale, neck veins flat  Cardiovascular:  rrr Lungs:  Clear  Abdomen:  Soft, no OM  Musculoskeletal:  Intact  Skin:  Dry and intact   LABS:  CBC Recent Labs     10/14/12  1510  10/14/12  1551  10/14/12  2345  10/15/12  0500  WBC  7.0   --    --   6.0  HGB  5.0*  4.8*  7.6*  9.1*  HCT  15.6*  14.0*  22.6*  27.1*  PLT  186   --    --   130*   Coag's Recent Labs     10/14/12  1510  INR  1.14   BMET Recent Labs     10/14/12  1510  10/14/12  1551  10/15/12  0500  NA  138  141  143  K  3.7  3.8  3.3*  CL  101  104  108  CO2  25   --   27  BUN  27*  26*  23  CREATININE  0.80  0.90  0.88  GLUCOSE  258*  254*  79   Electrolytes Recent Labs     10/14/12  1510  10/15/12  0500  CALCIUM  8.3*  7.9*   Sepsis Markers No results found for this basename: LACTICACIDVEN, PROCALCITON, O2SATVEN,  in the last 72 hours ABG Recent Labs     10/14/12  1616  PHART  7.574*  PCO2ART  25.8*  PO2ART  179.0*   Liver Enzymes No results found for this basename: AST, ALT, ALKPHOS, BILITOT, ALBUMIN,  in the last 72 hours Cardiac Enzymes No results found for this basename: TROPONINI, PROBNP,  in the last 72 hours Glucose Recent Labs     10/14/12  1747  10/14/12  2054  10/14/12  2346  10/15/12  0336  GLUCAP  179*  173*  142*  85    Imaging No results found.   CXR:   ASSESSMENT / PLAN:  PULMONARY A:  acute on chronic resp failure in setting of acute anemia superimposed on underlying GOLD stage C COPD, stage IIIB/VI NSCLC.  Most recent scan suggesting evidence of perilesional pneumonitis.  P:   Supplemental oxygen  Scheduled BDs Pulse ox Could trial BIPAP if needed should he have increase  WOB  CARDIOVASCULAR A:  Hemorrhagic shock in setting of acute GIB.  BP stabilized.  P:  hgb goal >7 (but liberalize if Hypotensive)  Admit to ICU Hold asa and any antihypertensives    RENAL A:  No acute issue At risk for AKI P:   Trend chemistry Keep  euvolemic  Strict I&O  GASTROINTESTINAL A:   Acute GIB. Recent CT evidence of Colitis (terminal ileum/cecum) In setting of dark bloody stool concern would be could there also be UGI involvement, GI thinks mostly Lower source w/ minimal rise in BUN P:   NPO Hold asa GI consulted-->considering EGD PPI  HEMATOLOGIC A:   Acute on chronic anemia: acute blood loss anemia superimposed on chemo-induced anemia/ ACD (BL hgb mid 8 to 9 range) hgb holding s/p transfusions.  P:  See GI section SCDs Transfuse   INFECTIOUS A:  Colitis. Not sure if infectious  P:   f/u cdiff  See flow sheet above   ENDOCRINE A:   DM H/o steroids  P:   ssi Low threshold for stress dose steroids if gets hypotensive   NEUROLOGIC A:  No focal issue  P:   Supportive care   TODAY'S SUMMARY:   This is a 58 yom w/ stage IIIB/VI NSCLC s/p last cycle of chemo on 7/8   w/ Docetaxel and GOLD stage C COPD. Presents to the ER on 8/3 w/ acute GIB and hemorrhagic shock. Will admit to ICU, GI has been called. S/p 3 units PRBC. Hgb holding. Will await GI input re: what next step is. Will let him get OOB.    Pulmonary and Critical Care Medicine [facility] Pager: [phone]  10/15/2012, 8:06 AM      Reviewed above, examined pt, and agree with assessment/plan.  Appreciate help from GI.  Most likely lower GI bleed from colitis.  Much improved after transfusion and IV fluids.  Continue Abx for colitis.  Advance diet as tolerated.  F/u CBC.  If stable, may be ready for d/c home in next 24 to 48 hours.  Updated wife about plan at bedside.  Steven Helling, MD [facility] Pulmonary/Critical Care 10/15/2012, 11:19 AM Pager:  [phone] After 3pm  call: [phone]

## 2012-10-15 NOTE — Progress Notes (Signed)
eLink Physician-Brief Progress Note Patient Name: [name]. DOB: 07/29/1935 MRN: [id number]  Date of Service  10/15/2012   HPI/Events of Note  Patient with GIB s/p 2 units of pRBC with increase in Hgb from 5.0 to 7.6 however continues to have relative hypotension with current BP of 90/37 (54).  Has voided incontinently times one.   eICU Interventions  Plan: Transfuse 1 additional unit of pRBC Continue to monitor BP - consider aline.   Intervention Category Intermediate Interventions: Bleeding - evaluation and treatment with blood products;Hypotension - evaluation and management  Ashton Belote 10/15/2012, 12:32 AM

## 2012-10-15 NOTE — Progress Notes (Signed)
eLink Nursing ICU Electrolyte Replacement Protocol  Patient Name: Anish Vana. DOB: 1935/07/18 MRN: [id number]  Date of Service  10/15/2012   HPI/Events of Note    Recent Labs Lab 10/09/12 0911  10/14/12 1510 10/14/12 1551 10/15/12 0500  NA 141  --  138 141 143  K 4.0  < > 3.7 3.8 3.3*  CL  --   --  101 104 108  CO2 26  --  25  --  27  GLUCOSE 108  --  258* 254* 79  BUN 16.8  --  27* 26* 23  CREATININE 0.8  --  0.80 0.90 0.88  CALCIUM 8.9  --  8.3*  --  7.9*  < > = values in this interval not displayed.  Estimated Creatinine Clearance: 74.9 ml/min (by C-G formula based on Cr of 0.88).  Intake/Output     08/03 0701 - 08/04 0700   I.V. (mL/kg) 1528.8 (19.8)   Blood 1050   IV Piggyback 300   Total Intake(mL/kg) 2878.8 (37.3)   Net +2878.8       Urine Occurrence 2 x   Stool Occurrence 1 x    - I/O DETAILED x24h    Total I/O In: 1873.8 [I.V.:523.8; Blood:1050; IV Piggyback:300] Out: -  - I/O THIS SHIFT    ASSESSMENT   eICURN Interventions  K+ 3.3 Electrolyte protocol criteria met. K+ replaced using protocol. MD notified    ASSESSMENT: MAJOR ELECTROLYTE    Merita Norton 10/15/2012, 6:09 AM

## 2012-10-16 ENCOUNTER — Other Ambulatory Visit: Payer: Medicare Other | Admitting: Lab

## 2012-10-16 DIAGNOSIS — R5381 Other malaise: Secondary | ICD-10-CM

## 2012-10-16 LAB — TYPE AND SCREEN
ABO/RH(D): A POS
Antibody Screen: NEGATIVE
Unit division: 0

## 2012-10-16 LAB — BASIC METABOLIC PANEL
Calcium: 7.9 mg/dL — ABNORMAL LOW (ref 8.4–10.5)
GFR calc Af Amer: >90 mL/min (ref >90)
GFR calc non Af Amer: 86 mL/min — ABNORMAL LOW (ref >90)
Potassium: 3 mEq/L — ABNORMAL LOW (ref 3.5–5.1)
Sodium: 139 mEq/L (ref 135–145)

## 2012-10-16 LAB — CBC
Hemoglobin: 8.5 g/dL — ABNORMAL LOW (ref 13.0–17.0)
Platelets: 109 10*3/uL — ABNORMAL LOW (ref 150–400)
RBC: 2.7 MIL/uL — ABNORMAL LOW (ref 4.22–5.81)
WBC: 5.4 10*3/uL (ref 4.0–10.5)

## 2012-10-16 LAB — GLUCOSE, CAPILLARY
Glucose-Capillary: 77 mg/dL (ref 70–99)
Glucose-Capillary: 130 mg/dL — ABNORMAL HIGH (ref 70–99)
Glucose-Capillary: 108 mg/dL — ABNORMAL HIGH (ref 70–99)

## 2012-10-16 MED ORDER — TIOTROPIUM BROMIDE MONOHYDRATE 18 MCG IN CAPS
18.0000 ug | ORAL_CAPSULE | Freq: Every day | RESPIRATORY_TRACT | Status: DC
  Administered 2012-10-16 – 2012-10-18 (×3): 18 ug via RESPIRATORY_TRACT
  Filled 2012-10-16: qty 5

## 2012-10-16 MED ORDER — ALPRAZOLAM 0.5 MG PO TABS: 0.5000 mg | ORAL_TABLET | Freq: Two times a day (BID) | ORAL | Status: DC | PRN

## 2012-10-16 MED ORDER — POTASSIUM CHLORIDE CRYS ER 20 MEQ PO TBCR
40.0000 meq | EXTENDED_RELEASE_TABLET | Freq: Once | ORAL | Status: AC
  Administered 2012-10-16: 40 meq via ORAL
  Filled 2012-10-16: qty 2

## 2012-10-16 NOTE — Evaluation (Signed)
Physical Therapy Evaluation Patient Details Name: [name]. MRN: [id number] DOB: February 20, 1936 Today's Date: 10/16/2012 Time: 1540-1600 PT Time Calculation (min): 20 min  PT Assessment / Plan / Recommendation History of Present Illness  This is a 77 yom w/ stage IIIB/VI NSCLC s/p last cycle of chemo on 7/8   w/ Docetaxel and GOLD stage C COPD. Presents to the ER on 8/3 w/ cc of dk bloody diarrhea w/ associated light-headedness. On presentation to ER found to be hypotensive w/ Hgb of 5. Pt was found to have colitis and ileitis  Clinical Impression  Pt has not been out of bed in a few days and continues to have some diarrhea and decreased blood pressure. He is able to move to the chair with min assist and remembers exercises that he has had from previous HHPT.  He does not think he needs follow up PT at discharge, but we will follow him while he is an inpatient to improve activity tolerance and ambulation to prepare for d/c to home    PT Assessment  Patient needs continued PT services    Follow Up Recommendations  No PT follow up    Does the patient have the potential to tolerate intense rehabilitation      Barriers to Discharge        Equipment Recommendations  None recommended by PT    Recommendations for Other Services     Frequency Min 3X/week    Precautions / Restrictions Precautions Precaution Comments: pt reports he continues to have diarrhea Restrictions Weight Bearing Restrictions: No   Pertinent Vitals/Pain No c/o pain      Mobility  Bed Mobility Bed Mobility: Supine to Sit Supine to Sit: 6: Modified independent (Device/Increase time) Transfers Transfers: Sit to Stand;Stand to Sit Sit to Stand: 6: Modified independent (Device/Increase time) Ambulation/Gait Ambulation/Gait Assistance: 6: Modified independent (Device/Increase time) Ambulation Distance (Feet): 3 Feet Assistive device: None;Rolling walker Ambulation/Gait Assistance Details: pt assisted to  chair General Gait Details: did not progres ambulation distance due to decreased BP and pt fear of diarrhea.  He states he hasn't been up in several days Stairs: No Wheelchair Mobility Wheelchair Mobility: No    Exercises     PT Diagnosis: Generalized weakness  PT Problem List: Decreased strength;Decreased activity tolerance;Cardiopulmonary status limiting activity PT Treatment Interventions: Gait training;Therapeutic activities     PT Goals(Current goals can be found in the care plan section) Acute Rehab PT Goals Patient Stated Goal: to get the diarrhea to stop PT Goal Formulation: With patient Time For Goal Achievement: 10/30/12 Potential to Achieve Goals: Good  Visit Information  Last PT Received On: 10/16/12 Assistance Needed: +1 History of Present Illness: This is a 19 yom w/ stage IIIB/VI NSCLC s/p last cycle of chemo on 7/8   w/ Docetaxel and GOLD stage C COPD. Presents to the ER on 8/3 w/ cc of dk bloody diarrhea w/ associated light-headedness. On presentation to ER found to be hypotensive w/ Hgb of 5. Pt was found to have colitis and ileitis       Prior Functioning  Home Living Family/patient expects to be discharged to:: Private residence Living Arrangements: Spouse/significant other Available Help at Discharge: Family Type of Home: House Home Access: Stairs to enter Secretary/administrator of Steps: 1 Home Layout: Able to live on main level with bedroom/bathroom (pt has his room on the lower level) Home Equipment: Walker - 2 wheels;Wheelchair - manual;Bedside commode Prior Function Level of Independence: Independent with assistive device(s)  Cognition  Cognition Arousal/Alertness: Awake/alert Behavior During Therapy: WFL for tasks assessed/performed Overall Cognitive Status: Within Functional Limits for tasks assessed    Extremity/Trunk Assessment Lower Extremity Assessment Lower Extremity Assessment: Generalized weakness;Overall WFL for tasks assessed    Balance Balance Balance Assessed: Yes Static Sitting Balance Static Sitting - Balance Support: No upper extremity supported;Feet supported Static Sitting - Level of Assistance: 7: Independent Static Standing Balance Static Standing - Balance Support: Bilateral upper extremity supported Static Standing - Level of Assistance: 6: Modified independent (Device/Increase time)  End of Session PT - End of Session Activity Tolerance: Patient tolerated treatment well Patient left: in chair  GP    Rosey Bath K. Manson Passey, [place] [phone] 10/16/2012, 4:12 PM

## 2012-10-16 NOTE — Progress Notes (Signed)
PULMONARY  / CRITICAL CARE MEDICINE  Name: [name]. MRN: [id number] DOB: [birth year]    ADMISSION DATE:  10/14/2012  REFERRING MD :  [name]   CHIEF COMPLAINT:  Bloody diarrhea and weakness   BRIEF PATIENT DESCRIPTION:  77 yo male with stage IIIB NSCLC s/p chemo 09/18/12, found to have terminal ileum/cecal colitis on CT abd from 7/29 >> started on Abx as outpt.  Presented 8/03 with weakness and bloody diarrhea.  PCCM asked to admit to ICU.  Followed by Dr. Delford Field for GOLD C COPD.    SIGNIFICANT EVENTS: 8/03 Admit with GI bleed, transfuse PRBC, [name] consulted 8/04 Start clear liquids, change to SDU status 8/05 Transfer to medical floor  STUDIES:  7/29 CT abd/pelvis >> new inflammation terminal ileum and cecum  LINES / TUBES: Port   CULTURES: C diff 8/3>>>negative  ANTIBIOTICS: cipro 7/30 (started out-pt)>>> Flagyl 7/30 (started out-pt)>>>  SUBJECTIVE:  No BM since last night.  Feels better.  Denies chest/abd pain.  Breathing okay.  VITAL SIGNS: Temp:  [97 F (36.1 C)-98.4 F (36.9 C)] 98 F (36.7 C) (08/05 0400) Pulse Rate:  [69-89] 80 (08/05 0600) Resp:  [13-24] 16 (08/05 0600) BP: (87-111)/(30-52) 111/47 mmHg (08/05 0600) SpO2:  [98 %-100 %] 98 % (08/05 0600) Weight:  [173 lb 1 oz (78.5 kg)] 173 lb 1 oz (78.5 kg) (08/05 0500) 4 liters [place]   INTAKE / OUTPUT: Intake/Output     08/04 0701 - 08/05 0700 08/05 0701 - 08/06 0700   P.O. 120    I.V. (mL/kg) 1875 (23.9)    Blood     IV Piggyback 750    Total Intake(mL/kg) 2745 (35)    Urine (mL/kg/hr) 600 (0.3)    Total Output 600     Net +2145            PHYSICAL EXAMINATION: General: No distress Neuro: Alert, pleasant, normal strength  HEENT: no sinus tenderness Cardiovascular: regular Lungs: decreased breath sounds, no wheeze Abdomen:  Soft, non tender, + bowel sounds Musculoskeletal: edema Skin: no rashes  LABS:  CBC Recent Labs     10/14/12  1510   10/15/12  0500  10/15/12  1920   10/16/12  0400  WBC  7.0   --   6.0   --   5.4  HGB  5.0*   < >  9.1*  8.6*  8.5*  HCT  15.6*   < >  27.1*  26.0*  25.4*  PLT  186   --   130*   --   109*   < > = values in this interval not displayed.   Coag's Recent Labs     10/14/12  1510  INR  1.14   BMET Recent Labs     10/14/12  1510  10/14/12  1551  10/15/12  0500  10/16/12  0400  NA  138  141  143  139  K  3.7  3.8  3.3*  3.0*  CL  101  104  108  106  CO2  25   --   27  25  BUN  27*  26*  23  12  CREATININE  0.80  0.90  0.88  0.79  GLUCOSE  258*  254*  79  75   Electrolytes Recent Labs     10/14/12  1510  10/15/12  0500  10/16/12  0400  CALCIUM  8.3*  7.9*  7.9*   ABG Recent Labs  10/14/12  1616  PHART  7.574*  PCO2ART  25.8*  PO2ART  179.0*   Glucose Recent Labs     10/15/12  0755  10/15/12  0924  10/15/12  1142  10/15/12  1659  10/15/12  1956  10/15/12  2327  GLUCAP  68*  93  122*  122*  104*  77    Imaging No results found.   Iron/TIBC/Ferritin    Component Value Date/Time   IRON 28* 11/13/2010 2000   TIBC 215 11/13/2010 2000   FERRITIN 605* 11/13/2010 2000     ASSESSMENT / PLAN:  PULMONARY A: Acute on chronic respiratory failure 2nd to profound anemia with hx of GOLD C COPD and Stage IIIB NSCLC. P:   -oxygen to keep SpO2 > 90% -CXR as needed -Resume spiriva -prn albuterol  CARDIOVASCULAR A: Hemorrhagic shock in setting of acute GIB >> resolved. Hx of hyperlipidemia, HTN. P:  -monitor hemodynamics -hold asa, lipitor for now  RENAL A: Hypokalemia. P:   -f/u and replace electrolytes as needed  GASTROINTESTINAL A: Acute GI bleed >> most likely lower GI bleed in setting of ileo-cecal colitis. P:   -advance to full liquid diet -continue BID protonix per GI  HEMATOLOGIC A:  Acute blood loss anemia from GI bleed. Anemia of chronic disease. P:  -f/u CBC -check iron levels -SCD for DVT prevention  INFECTIOUS A:  Colitis. P:   -continue IV cipro, flagyl >> if  stable, then consider change to PO on 8/06  ENDOCRINE A:  DM type II. P:   -continue SSI until on regular diet  NEUROLOGIC A:  No focal issue  P:   Supportive care   Code status >> no CPR, no defibrillation.  Okay with short term intubation >> would not want long term vent support.  OOB, PT/OT evaluation, transfer to medical floor.  Keep on PCCM service >> likely d/c home in 24 to 48 hrs.  Coralyn Helling, MD [facility] Pulmonary/Critical Care 10/16/2012, 8:18 AM Pager:  [phone] After 3pm call: [phone]

## 2012-10-16 NOTE — Progress Notes (Signed)
[facility] Progress Note  Subjective: The patient feels much better today. He is not reporting any bleeding overnight.  Objective: Vital signs in last 24 hours: Temp:  [97.6 F (36.4 C)-98.4 F (36.9 C)] 98 F (36.7 C) (08/05 0400) Pulse Rate:  [69-85] 84 (08/05 0933) Resp:  [13-24] 16 (08/05 0933) BP: (87-111)/(30-66) 107/66 mmHg (08/05 0933) SpO2:  [98 %-100 %] 99 % (08/05 0933) FiO2 (%):  [100 %] 100 % (08/05 0933) Weight:  [78.5 kg (173 lb 1 oz)] 78.5 kg (173 lb 1 oz) (08/05 0500) Weight change: 0.2 kg (7.1 oz)   PE:  He is in no distress  Heart regular rhythm  Abdomen soft nontender  Lab Results: Results for orders placed during the hospital encounter of 10/14/12 (from the past 24 hour(s))  GLUCOSE, CAPILLARY     Status: Abnormal   Collection Time    10/15/12 11:42 AM      Result Value Range   Glucose-Capillary 122 (*) 70 - 99 mg/dL  GLUCOSE, CAPILLARY     Status: Abnormal   Collection Time    10/15/12  4:59 PM      Result Value Range   Glucose-Capillary 122 (*) 70 - 99 mg/dL  HEMOGLOBIN AND HEMATOCRIT, BLOOD     Status: Abnormal   Collection Time    10/15/12  7:20 PM      Result Value Range   Hemoglobin 8.6 (*) 13.0 - 17.0 g/dL   HCT 16.1 (*) 09.6 - 04.5 %  GLUCOSE, CAPILLARY     Status: Abnormal   Collection Time    10/15/12  7:56 PM      Result Value Range   Glucose-Capillary 104 (*) 70 - 99 mg/dL  GLUCOSE, CAPILLARY     Status: None   Collection Time    10/15/12 11:27 PM      Result Value Range   Glucose-Capillary 77  70 - 99 mg/dL  BASIC METABOLIC PANEL     Status: Abnormal   Collection Time    10/16/12  4:00 AM      Result Value Range   Sodium 139  135 - 145 mEq/L   Potassium 3.0 (*) 3.5 - 5.1 mEq/L   Chloride 106  96 - 112 mEq/L   CO2 25  19 - 32 mEq/L   Glucose, Bld 75  70 - 99 mg/dL   BUN 12  6 - 23 mg/dL   Creatinine, Ser 4.09  0.50 - 1.35 mg/dL   Calcium 7.9 (*) 8.4 - 10.5 mg/dL   GFR calc non Af Amer 86 (*) >90 mL/min   GFR calc Af Amer >90  >90 mL/min  CBC     Status: Abnormal   Collection Time    10/16/12  4:00 AM      Result Value Range   WBC 5.4  4.0 - 10.5 K/uL   RBC 2.70 (*) 4.22 - 5.81 MIL/uL   Hemoglobin 8.5 (*) 13.0 - 17.0 g/dL   HCT 81.1 (*) 91.4 - 78.2 %   MCV 94.1  78.0 - 100.0 fL   MCH 31.5  26.0 - 34.0 pg   MCHC 33.5  30.0 - 36.0 g/dL   RDW 95.6 (*) 21.3 - 08.6 %   Platelets 109 (*) 150 - 400 K/uL  GLUCOSE, CAPILLARY     Status: None   Collection Time    10/16/12  7:55 AM      Result Value Range   Glucose-Capillary 77  70 - 99 mg/dL  Studies/Results: @[id number] @    Assessment:  gastrointestinal bleeding secondary to colitis and ileitis. This could be infectious or ischemic. Clinical symptoms seem to be improving  Plan:  recommend conservative management and observation.    Graylin Shiver 10/16/2012, 10:04 AM  Lab Results  Component Value Date   HGB 8.5* 10/16/2012   HGB 8.6* 10/15/2012   HGB 9.1* 10/15/2012   HGB 8.7* 10/09/2012   HGB 9.6* 09/25/2012   HGB 9.4* 09/18/2012   HCT 25.4* 10/16/2012   HCT 26.0* 10/15/2012   HCT 27.1* 10/15/2012   HCT 25.7* 10/09/2012   HCT 28.7* 09/25/2012   HCT 28.7* 09/18/2012   ALKPHOS 70 10/09/2012   ALKPHOS 62 09/25/2012   ALKPHOS 56 09/18/2012   ALKPHOS 77 07/12/2012   ALKPHOS 136* 07/11/2012   ALKPHOS 48 10/19/2011   ALKPHOS 60 07/14/2011   ALKPHOS 63 04/08/2011   ALKPHOS 73 12/08/2010   AST 11 10/09/2012   AST 9 09/25/2012   AST 10 09/18/2012   AST 12 07/12/2012   AST 24 07/11/2012   AST 19 10/19/2011   AST 16 07/14/2011   AST 15 04/08/2011   AST 17 12/08/2010   ALT 10 10/09/2012   ALT 13 09/25/2012   ALT 12 09/18/2012   ALT 13 07/12/2012   ALT 18 07/11/2012   ALT 26 10/19/2011   ALT 13 07/14/2011   ALT 14 04/08/2011   ALT 15 12/08/2010

## 2012-10-17 LAB — IRON AND TIBC
Saturation Ratios: 28 % (ref 20–55)
TIBC: 169 ug/dL — ABNORMAL LOW (ref 215–435)

## 2012-10-17 LAB — GLUCOSE, CAPILLARY
Glucose-Capillary: 88 mg/dL (ref 70–99)
Glucose-Capillary: 107 mg/dL — ABNORMAL HIGH (ref 70–99)
Glucose-Capillary: 164 mg/dL — ABNORMAL HIGH (ref 70–99)

## 2012-10-17 LAB — BASIC METABOLIC PANEL
BUN: 6 mg/dL (ref 6–23)
Calcium: 8.1 mg/dL — ABNORMAL LOW (ref 8.4–10.5)
GFR calc Af Amer: >90 mL/min (ref >90)
GFR calc non Af Amer: 84 mL/min — ABNORMAL LOW (ref >90)
Glucose, Bld: 85 mg/dL (ref 70–99)
Potassium: 3.4 mEq/L — ABNORMAL LOW (ref 3.5–5.1)
Sodium: 138 mEq/L (ref 135–145)

## 2012-10-17 LAB — CBC
MCH: 31.7 pg (ref 26.0–34.0)
MCHC: 33.5 g/dL (ref 30.0–36.0)
RDW: 17.7 % — ABNORMAL HIGH (ref 11.5–15.5)

## 2012-10-17 MED ORDER — SODIUM CHLORIDE 0.9 % IJ SOLN
10.0000 mL | INTRAMUSCULAR | Status: DC | PRN
  Administered 2012-10-18: 10 mL

## 2012-10-17 MED ORDER — PANTOPRAZOLE SODIUM 40 MG PO TBEC: 40.0000 mg | DELAYED_RELEASE_TABLET | Freq: Two times a day (BID) | ORAL | Status: DC

## 2012-10-17 MED ORDER — PANTOPRAZOLE SODIUM 40 MG PO TBEC
40.0000 mg | DELAYED_RELEASE_TABLET | Freq: Every day | ORAL | Status: DC
Start: 2012-10-17 — End: 2012-10-18
  Administered 2012-10-18: 40 mg via ORAL
  Filled 2012-10-17: qty 1

## 2012-10-17 MED ORDER — METRONIDAZOLE 500 MG PO TABS
500.0000 mg | ORAL_TABLET | Freq: Three times a day (TID) | ORAL | Status: DC
  Administered 2012-10-17 – 2012-10-18 (×3): 500 mg via ORAL
  Filled 2012-10-17 (×6): qty 1

## 2012-10-17 MED ORDER — INSULIN ASPART 100 UNIT/ML ~~LOC~~ SOLN: 0.0000 [IU] | Freq: Every day | SUBCUTANEOUS | Status: DC

## 2012-10-17 MED ORDER — INSULIN ASPART 100 UNIT/ML ~~LOC~~ SOLN: 0.0000 [IU] | Freq: Three times a day (TID) | SUBCUTANEOUS | Status: DC

## 2012-10-17 MED ORDER — CIPROFLOXACIN HCL 500 MG PO TABS
500.0000 mg | ORAL_TABLET | Freq: Two times a day (BID) | ORAL | Status: DC
  Administered 2012-10-17 – 2012-10-18 (×2): 500 mg via ORAL
  Filled 2012-10-17 (×5): qty 1

## 2012-10-17 NOTE — Progress Notes (Signed)
[facility] Progress Note  Subjective: The patient has no complaints. He denies abdominal pain. He denies any gastrointestinal bleeding at this time.  Objective: Vital signs in last 24 hours: Temp:  [98 F (36.7 C)-98.2 F (36.8 C)] 98 F (36.7 C) (08/06 0611) Pulse Rate:  [75-93] 81 (08/06 0611) Resp:  [16-18] 18 (08/06 0611) BP: (93-101)/(53-62) 93/53 mmHg (08/06 0611) SpO2:  [98 %-100 %] 98 % (08/06 0750) Weight change:    PE:  He is in no distress  Abdomen is soft and nontender  Lab Results: Results for orders placed during the hospital encounter of 10/14/12 (from the past 24 hour(s))  GLUCOSE, CAPILLARY     Status: Abnormal   Collection Time    10/16/12 11:47 AM      Result Value Range   Glucose-Capillary 130 (*) 70 - 99 mg/dL  GLUCOSE, CAPILLARY     Status: Abnormal   Collection Time    10/16/12  6:17 PM      Result Value Range   Glucose-Capillary 108 (*) 70 - 99 mg/dL   Comment 1 Notify RN    GLUCOSE, CAPILLARY     Status: Abnormal   Collection Time    10/16/12  7:53 PM      Result Value Range   Glucose-Capillary 105 (*) 70 - 99 mg/dL  GLUCOSE, CAPILLARY     Status: None   Collection Time    10/16/12 11:41 PM      Result Value Range   Glucose-Capillary 84  70 - 99 mg/dL   Comment 1 Notify RN    GLUCOSE, CAPILLARY     Status: None   Collection Time    10/17/12  3:43 AM      Result Value Range   Glucose-Capillary 88  70 - 99 mg/dL   Comment 1 Notify RN    CBC     Status: Abnormal   Collection Time    10/17/12  6:15 AM      Result Value Range   WBC 5.1  4.0 - 10.5 K/uL   RBC 2.90 (*) 4.22 - 5.81 MIL/uL   Hemoglobin 9.2 (*) 13.0 - 17.0 g/dL   HCT 16.1 (*) 09.6 - 04.5 %   MCV 94.8  78.0 - 100.0 fL   MCH 31.7  26.0 - 34.0 pg   MCHC 33.5  30.0 - 36.0 g/dL   RDW 40.9 (*) 81.1 - 91.4 %   Platelets 121 (*) 150 - 400 K/uL  BASIC METABOLIC PANEL     Status: Abnormal   Collection Time    10/17/12  6:15 AM      Result Value Range   Sodium 138   135 - 145 mEq/L   Potassium 3.4 (*) 3.5 - 5.1 mEq/L   Chloride 104  96 - 112 mEq/L   CO2 27  19 - 32 mEq/L   Glucose, Bld 85  70 - 99 mg/dL   BUN 6  6 - 23 mg/dL   Creatinine, Ser 7.82  0.50 - 1.35 mg/dL   Calcium 8.1 (*) 8.4 - 10.5 mg/dL   GFR calc non Af Amer 84 (*) >90 mL/min   GFR calc Af Amer >90  >90 mL/min  IRON AND TIBC     Status: Abnormal   Collection Time    10/17/12  6:15 AM      Result Value Range   Iron 47  42 - 135 ug/dL   TIBC 956 (*) 213 - 086 ug/dL   Saturation  Ratios 28  20 - 55 %   UIBC 122 (*) 125 - 400 ug/dL  FERRITIN     Status: Abnormal   Collection Time    10/17/12  6:15 AM      Result Value Range   Ferritin 750 (*) 22 - 322 ng/mL  GLUCOSE, CAPILLARY     Status: None   Collection Time    10/17/12  7:45 AM      Result Value Range   Glucose-Capillary 89  70 - 99 mg/dL   Comment 1 Notify RN      Studies/Results: @[id number] @    Assessment: Gastrointestinal bleeding. Suspect the source was from colitis in the cecum and ileitis which was noted on CT scan. These are either infectious or ischemic in nature. He seems to have responded nicely to conservative management.  Plan: Continue conservative management. No further GI workup planned at this time. We will sign off, call us again if needed.    Graylin Shiver 10/17/2012, 11:06 AM  Lab Results  Component Value Date   HGB 9.2* 10/17/2012   HGB 8.5* 10/16/2012   HGB 8.6* 10/15/2012   HGB 8.7* 10/09/2012   HGB 9.6* 09/25/2012   HGB 9.4* 09/18/2012   HCT 27.5* 10/17/2012   HCT 25.4* 10/16/2012   HCT 26.0* 10/15/2012   HCT 25.7* 10/09/2012   HCT 28.7* 09/25/2012   HCT 28.7* 09/18/2012   ALKPHOS 70 10/09/2012   ALKPHOS 62 09/25/2012   ALKPHOS 56 09/18/2012   ALKPHOS 77 07/12/2012   ALKPHOS 136* 07/11/2012   ALKPHOS 48 10/19/2011   ALKPHOS 60 07/14/2011   ALKPHOS 63 04/08/2011   ALKPHOS 73 12/08/2010   AST 11 10/09/2012   AST 9 09/25/2012   AST 10 09/18/2012   AST 12 07/12/2012   AST 24 07/11/2012   AST 19 10/19/2011   AST 16  07/14/2011   AST 15 04/08/2011   AST 17 12/08/2010   ALT 10 10/09/2012   ALT 13 09/25/2012   ALT 12 09/18/2012   ALT 13 07/12/2012   ALT 18 07/11/2012   ALT 26 10/19/2011   ALT 13 07/14/2011   ALT 14 04/08/2011   ALT 15 12/08/2010

## 2012-10-17 NOTE — Progress Notes (Signed)
Physical Therapy Treatment Patient Details Name: Edenilson Austad. MRN: [id number] DOB: 02/01/1936 Today's Date: 10/17/2012 Time: 4696-2952 PT Time Calculation (min): 35 min  PT Assessment / Plan / Recommendation  History of Present Illness This is a 77 yom w/ stage IIIB/VI NSCLC s/p last cycle of chemo on 7/8   w/ Docetaxel and GOLD stage C COPD. Presents to the ER on 8/3 w/ cc of dk bloody diarrhea w/ associated light-headedness. On presentation to ER found to be hypotensive w/ Hgb of 5. Pt was found to have colitis and ileitis   PT Comments   Pt reports diarrhea is slowing down and he is starting solid foods.  Still c/o MAX weakness.  Assisted to EOB pt has to sit several minuets due to c/o dizzyness.  Assisted to [facility] then a few steps to recliner.  Pt plans to d/c to home with spouse.   Follow Up Recommendations  No PT follow up     Does the patient have the potential to tolerate intense rehabilitation     Barriers to Discharge        Equipment Recommendations  None recommended by PT    Recommendations for Other Services    Frequency Min 3X/week   Progress towards PT Goals Progress towards PT goals: Progressing toward goals  Plan      Precautions / Restrictions Precautions Precautions: None Precaution Comments: pt reports he continues to have diarrhea Restrictions Weight Bearing Restrictions: No    Pertinent Vitals/Pain No c/o pain BP EOB 85/55 reported to RN    Mobility  Bed Mobility Bed Mobility: Supine to Sit Supine to Sit: 6: Modified independent (Device/Increase time) Details for Bed Mobility Assistance: increased time esp to scoot to EOB Transfers Transfers: Sit to Stand;Stand to Sit Sit to Stand: 5: Supervision Stand to Sit: 5: Supervision Details for Transfer Assistance: from bed to [facility] then a cpoule of steps to recliner Ambulation/Gait Ambulation/Gait Assistance: 5: Supervision Ambulation Distance (Feet): 3 Feet Assistive device: Rolling  walker Ambulation/Gait Assistance Details: Only able to tolerate a few steps from [facility] to recliner Gait Pattern: Step-to pattern;Trunk flexed General Gait Details: did not progres ambulation distance due to decreased BP and max c/o fatigue.     PT Goals (current goals can now be found in the care plan section)    Visit Information  Last PT Received On: 10/17/12 History of Present Illness: This is a 33 yom w/ stage IIIB/VI NSCLC s/p last cycle of chemo on 7/8   w/ Docetaxel and GOLD stage C COPD. Presents to the ER on 8/3 w/ cc of dk bloody diarrhea w/ associated light-headedness. On presentation to ER found to be hypotensive w/ Hgb of 5. Pt was found to have colitis and ileitis    Subjective Data      Cognition       Balance     End of Session PT - End of Session Equipment Utilized During Treatment: Gait belt Activity Tolerance: Patient limited by fatigue Patient left: in chair;with call bell/phone within reach;with family/visitor present   Felecia Shelling  PTA [facility]  Acute  Rehab Pager      [phone]

## 2012-10-17 NOTE — Progress Notes (Signed)
Occupational Therapy Evaluation Patient Details Name: [name]. MRN: [id number] DOB: 06/18/1935 Today's Date: 10/17/2012 Time: 6213-0865 OT Time Calculation (min): 19 min  OT Assessment / Plan / Recommendation History of present illness This is a 77 yom w/ stage IIIB/VI NSCLC s/p last cycle of chemo on 7/8   w/ Docetaxel and GOLD stage C COPD. Presents to the ER on 8/3 w/ cc of dk bloody diarrhea w/ associated light-headedness. On presentation to ER found to be hypotensive w/ Hgb of 5. Pt was found to have colitis and ileitis   Clinical Impression   Educated pt/pt's wife on available AE to increase independence and conserve energy required for ADL. Wife/pt verbalized understanding. Wife is able to provide necessary level of assistance for D/C home. Pt close to baseline level of functioning. No equipment needs at this time. OT signing off. Thank you.    OT Assessment  Patient does not need any further OT services    Follow Up Recommendations  No OT follow up    Barriers to Discharge      Equipment Recommendations  None recommended by OT    Recommendations for Other Services    Frequency       Precautions / Restrictions Precautions Precautions: None Precaution Comments: pt reports he continues to have diarrhea Restrictions Weight Bearing Restrictions: No   Pertinent Vitals/Pain no apparent distress     ADL  ADL Comments: overall S with bathing/dressing. Wfie assists as needed. Currnetly at baseline.    OT Diagnosis:    OT Problem List:   OT Treatment Interventions:     OT Goals(Current goals can be found in the care plan section) Acute Rehab OT Goals OT Goal Formulation:  (eval only)  Visit Information  Last OT Received On: 10/17/12 Assistance Needed: +1 History of Present Illness: This is a 78 yom w/ stage IIIB/VI NSCLC s/p last cycle of chemo on 7/8   w/ Docetaxel and GOLD stage C COPD. Presents to the ER on 8/3 w/ cc of dk bloody diarrhea w/ associated  light-headedness. On presentation to ER found to be hypotensive w/ Hgb of 5. Pt was found to have colitis and ileitis       Prior Functioning     Home Living Family/patient expects to be discharged to:: Private residence Living Arrangements: Spouse/significant other Available Help at Discharge: Family Type of Home: House Home Access: Stairs to enter Secretary/administrator of Steps: 1 Home Layout: Able to live on main level with bedroom/bathroom (pt has his room on the lower level) Home Equipment: Walker - 2 wheels;Wheelchair - manual;Bedside commode Additional Comments: uses w/c for community Prior Function Level of Independence: Independent with assistive device(s) Comments: Pt with 2 recent falls; Wife assists with ADLs some days because he can't do it, and some days because it is more convenient.  Family and pt report fluctuations in status due to chemo side effects the past 6 weeks Communication Communication: No difficulties Dominant Hand: Right         Vision/Perception     Cognition  Cognition Arousal/Alertness: Awake/alert Behavior During Therapy: WFL for tasks assessed/performed Overall Cognitive Status: Within Functional Limits for tasks assessed    Extremity/Trunk Assessment Upper Extremity Assessment Upper Extremity Assessment: Overall WFL for tasks assessed Lower Extremity Assessment Lower Extremity Assessment: Generalized weakness Cervical / Trunk Assessment Cervical / Trunk Assessment: Normal     Mobility Bed Mobility Bed Mobility: Supine to Sit Supine to Sit: 6: Modified independent (Device/Increase time) Details for Bed  Mobility Assistance: increased time esp to scoot to EOB Transfers Sit to Stand: 5: Supervision Stand to Sit: 5: Supervision Details for Transfer Assistance: from bed to [facility] then a cpoule of steps to recliner     Exercise Other Exercises Other Exercises: BUE theraband exercises for general strengthening   Balance  Balance Balance Assessed: Yes Static Sitting Balance Static Sitting - Balance Support: No upper extremity supported;Feet supported Static Sitting - Level of Assistance: 7: Independent Static Standing Balance Static Standing - Balance Support: Bilateral upper extremity supported Static Standing - Level of Assistance: 6: Modified independent (Device/Increase time)   End of Session OT - End of Session Activity Tolerance: Patient tolerated treatment well Patient left: in bed;with call bell/phone within reach;with family/visitor present Nurse Communication: Mobility status  GO     [name],HILLARY 10/17/2012, 3:53 PM [facility], OTR/L  [phone] 10/17/2012

## 2012-10-17 NOTE — Discharge Summary (Signed)
Physician Discharge Summary     Patient ID: [name]. MRN: [id number] DOB/AGE: 19-Dec-1935 77 y.o.  Admit date: 10/14/2012 Discharge date: 10/18/2012  Discharge Diagnoses:  Acute blood loss anemia Lower GI bleed (had ileo-cecal colitis)  Anemia of chronic disease Probable iron deficiency anemia Acute on chronic respiratory failure in setting of profound anemia in setting of GOLD stage C COPD Stage IIIB Non-small cell lung cancer Diabetes type 2 Detailed Hospital Course:    This is a 4 yom w/ stage IIIB/VI NSCLC s/p last cycle of chemo on 7/8 w/ Docetaxel and GOLD stage C COPD. Presents to the ER on 8/3 w/ report of about 1 week of intermittent bloody stools and weakness. Then the am of 8/3 woke up reporting significant weakness f/b sudden incontinent episode of dk bloody diarrhea w/ associated light-headedness. On presentation to ER found to be hypotensive w/ Hgb of 5. PCCM was asked to admit.  He was initially admitted to the ICU. He received a total of 3 units PRBCs, was started on PPI to cover for possibility of UGI involvement and [name] was consulted. Of note had recent CT (head/chest/abd) ordered by heme/onc which showed localized colitis in terminal ileum/cecum on 7/29 for which he was started on flagyl and cipro. But did not suggest disease progression. His hgb stabilized. GI felt that this was primarily Lower GI bleeding as there was no rise in BUN, and risk benefit of EGD weighed more heavily to risk side given his poor baseline pulmonary status. Final recommendations for GI were to consider conservative measures, and slowly advance diet. His hgb remained stable. His diet was advanced and he was eventually deemed safe for discharge. He completed a total of 7 days empiric antibiotics for colitis. He will be discharge to home under the care of his wife.    Discharge Plan by diagnoses   Acute blood loss anemia, superimposed on ACD, w/ resultant Hemorrhagic shock (now resolved).  Most likely lower GI bleed in setting of ileo-cecal colitis.  P:  - hold asa, can resume on f/u visit  -ferritin level high, will hold off on Feso4 supp for now, f/u anemia panel as outpt  Acute on chronic respiratory failure 2nd to profound anemia with hx of GOLD C COPD and Stage IIIB NSCLC.  P:  -home on oxygen  -Resume spiriva  -prn albuterol  DM type II.  P:  -resume home rx    Significant Hospital tests/ studies/ interventions and procedures   Consults: [name]  SIGNIFICANT EVENTS:  8/03 Admit with GI bleed, transfuse PRBC, [name] consulted  8/04 Start clear liquids, change to SDU status  8/05 Transfer to medical floor   STUDIES:  7/29 CT abd/pelvis >> new inflammation terminal ileum and cecum   LINES / TUBES:  Port   CULTURES:  C diff 8/3>>>negative   ANTIBIOTICS:  cipro 7/30 (started out-pt)>>> 8/7 Flagyl 7/30 (started out-pt)>>>8/7 Discharge Exam: BP 100/61  Pulse 80  Temp(Src) 97.9 F (36.6 C) (Oral)  Resp 18  Ht 5\' 10"  (1.778 m)  Wt 173 lb 1 oz (78.5 kg)  BMI 24.83 kg/m2  SpO2 100%  PHYSICAL EXAMINATION:  General: No distress  Neuro: Alert, pleasant, normal strength  HEENT: no sinus tenderness  Cardiovascular: regular  Lungs: decreased breath sounds, no wheeze  Abdomen: Soft, non tender, + bowel sounds  Musculoskeletal: edema  Skin: no rashes  Labs at discharge Lab Results  Component Value Date   CREATININE 0.82 10/17/2012   BUN  6 10/17/2012   NA 138 10/17/2012   K 3.4* 10/17/2012   CL 104 10/17/2012   CO2 27 10/17/2012   Lab Results  Component Value Date   WBC 5.1 10/17/2012   HGB 9.2* 10/17/2012   HCT 27.5* 10/17/2012   MCV 94.8 10/17/2012   PLT 121* 10/17/2012   Lab Results  Component Value Date   ALT 10 10/09/2012   AST 11 10/09/2012   ALKPHOS 70 10/09/2012   BILITOT 0.58 10/09/2012   Lab Results  Component Value Date   INR 1.14 10/14/2012   INR 1.14 12/01/2010   INR 1.01 09/30/2010    Current radiology studies No results  found.  Disposition:  01-Home or Self Care       Future Appointments Provider Department Dept Phone   10/25/2012 2:30 PM Nyoka Cowden, MD [facility] [phone]   12/11/2012 4:00 PM Storm Frisk, MD [facility] [phone]   01/10/2013 10:00 AM Mauri [place] [facility] MEDICAL ONCOLOGY [phone]   01/10/2013 11:00 AM [facility] [phone]   Patient to arrive 15 minutes prior to appointment time.   01/14/2013 10:30 AM Si Gaul, MD [facility] MEDICAL ONCOLOGY [phone]       Medication List    STOP taking these medications       aspirin 81 MG chewable tablet     ciprofloxacin 500 MG tablet  Commonly known as:  CIPRO     ibuprofen 400 MG tablet  Commonly known as:  ADVIL,MOTRIN     metroNIDAZOLE 500 MG tablet  Commonly known as:  FLAGYL      TAKE these medications       albuterol (5 MG/ML) 0.5% nebulizer solution  Commonly known as:  PROVENTIL  Take 0.5 mLs (2.5 mg total) by nebulization every 4 (four) hours as needed for wheezing.     ALPRAZolam 0.5 MG tablet  Commonly known as:  XANAX  Take 0.5 mg by mouth at bedtime as needed for sleep or anxiety.     atorvastatin 40 MG tablet  Commonly known as:  LIPITOR  Take 40 mg by mouth daily.     dexamethasone 4 MG tablet  Commonly known as:  DECADRON  Take 1 tablet (4 mg total) by mouth as directed. 2 tablets BID day before, day of, day after chemotherapy.     glimepiride 4 MG tablet  Commonly known as:  AMARYL  Take 8 mg by mouth daily. 2 tabs daily with breakfast     lactose free nutrition Liqd  Take 237 mLs by mouth 2 (two) times daily between meals.     latanoprost 0.005 % ophthalmic solution  Commonly known as:  XALATAN  Place 1 drop into both eyes at bedtime.     nystatin 100000 UNIT/ML suspension  Commonly known as:  MYCOSTATIN  Take 10,000 Units by mouth 4 (four) times daily. As needed for  thrush     ondansetron 8 MG tablet  Commonly known as:  ZOFRAN  Take 1 tablet (8 mg total) by mouth every 8 (eight) hours as needed (as needed for nausea and vomiting).     pantoprazole 40 MG tablet  Commonly known as:  PROTONIX  Take 1 tablet (40 mg total) by mouth daily.     pioglitazone 30 MG tablet  Commonly known as:  ACTOS  Take 30 mg by mouth daily.     PRESCRIPTION MEDICATION  Inject 1 Syringe into  the vein every 7 (seven) days. DOCEtaxel (TAXOTERE) 50 mg in sodium chloride 0.9 % 250 mL chemo infusion 25 mg/m2  1.98 m2 (Treatment Plan Actual)  Once 09/18/2012 09/18/2012. For a total of 9 treatments. Has 3 more to go. Dr. Arbutus Ped.     prochlorperazine 10 MG tablet  Commonly known as:  COMPAZINE  Take 1 tablet (10 mg total) by mouth every 6 (six) hours as needed.     sitaGLIPtin 100 MG tablet  Commonly known as:  JANUVIA  Take 100 mg by mouth daily.     tiotropium 18 MCG inhalation capsule  Commonly known as:  SPIRIVA HANDIHALER  Place 1 capsule (18 mcg total) into inhaler and inhale daily.       Follow-up Information   Follow up with Sandrea Hughs, MD On 10/25/2012. (2:30pm )    Contact information:   [address] [place] [address] [phone]       Follow up with Shan Levans, MD On 12/11/2012. (4:00pm )    Contact information:   [address] [place] [address] [phone]       Discharged Condition: stable   Signed: Danford Bad, NP 10/18/2012  8:48 AM Pager: [phone] or [phone]  *Care during the described time interval was provided by me and/or other providers on the critical care team. I have reviewed this patient's available data, including medical history, events of note, physical examination and test results as part of my evaluation.  ALVA,RAKESH V.

## 2012-10-17 NOTE — Progress Notes (Signed)
PULMONARY  / CRITICAL CARE MEDICINE  Name: [name]. MRN: [id number] DOB: Jul 13, 1935    ADMISSION DATE:  10/14/2012  REFERRING MD :  [name]   CHIEF COMPLAINT:  Bloody diarrhea and weakness   BRIEF PATIENT DESCRIPTION:  77 yo male with stage IIIB NSCLC s/p chemo 09/18/12, found to have terminal ileum/cecal colitis on CT abd from 7/29 >> started on Abx as outpt.  Presented 8/03 with weakness and bloody diarrhea.  PCCM asked to admit to ICU.  Followed by Dr. Delford Field for GOLD C COPD.    SIGNIFICANT EVENTS: 8/03 Admit with GI bleed, transfuse PRBC, [name] consulted 8/04 Start clear liquids, change to SDU status 8/05 Transfer to medical floor  STUDIES:  7/29 CT abd/pelvis >> new inflammation terminal ileum and cecum  LINES / TUBES: Port   CULTURES: C diff 8/3>>>negative  ANTIBIOTICS: cipro 7/30 (started out-pt)>>> Flagyl 7/30 (started out-pt)>>>  SUBJECTIVE:  No BM since last night.  Feels better.  Denies chest/abd pain.  Breathing okay. Good UO -Fluids 'running through me'  VITAL SIGNS: Temp:  [98 F (36.7 C)-98.2 F (36.8 C)] 98 F (36.7 C) (08/06 4098) Pulse Rate:  [75-93] 81 (08/06 0611) Resp:  [16-18] 18 (08/06 0611) BP: (93-101)/(53-62) 93/53 mmHg (08/06 0611) SpO2:  [98 %-100 %] 98 % (08/06 0750) 4 liters [place]   INTAKE / OUTPUT: Intake/Output     08/05 0701 - 08/06 0700 08/06 0701 - 08/07 0700   P.O. 120    I.V. (mL/kg) 309.3 (3.9)    IV Piggyback 300    Total Intake(mL/kg) 729.3 (9.3)    Urine (mL/kg/hr) 1125 (0.6)    Total Output 1125     Net -395.7          Urine Occurrence 1 x    Stool Occurrence 4 x      PHYSICAL EXAMINATION: General: No distress Neuro: Alert, pleasant, normal strength  HEENT: no sinus tenderness Cardiovascular: regular Lungs: decreased breath sounds, no wheeze Abdomen:  Soft, non tender, + bowel sounds Musculoskeletal: edema Skin: no rashes  LABS:  CBC Recent Labs     10/15/12  0500  10/15/12  1920  10/16/12  0400  10/17/12  0615  WBC  6.0   --   5.4  5.1  HGB  9.1*  8.6*  8.5*  9.2*  HCT  27.1*  26.0*  25.4*  27.5*  PLT  130*   --   109*  121*   Coag's Recent Labs     10/14/12  1510  INR  1.14   BMET Recent Labs     10/15/12  0500  10/16/12  0400  10/17/12  0615  NA  143  139  138  K  3.3*  3.0*  3.4*  CL  108  106  104  CO2  27  25  27   BUN  23  12  6   CREATININE  0.88  0.79  0.82  GLUCOSE  79  75  85   Electrolytes Recent Labs     10/15/12  0500  10/16/12  0400  10/17/12  0615  CALCIUM  7.9*  7.9*  8.1*   ABG Recent Labs     10/14/12  1616  PHART  7.574*  PCO2ART  25.8*  PO2ART  179.0*   Glucose Recent Labs     10/16/12  1147  10/16/12  1817  10/16/12  1953  10/16/12  2341  10/17/12  0343  10/17/12  0745  GLUCAP  130*  108*  105*  84  88  89    Imaging No results found.   Iron/TIBC/Ferritin    Component Value Date/Time   IRON 47 10/17/2012 0615   TIBC 169* 10/17/2012 0615   FERRITIN 605* 11/13/2010 2000     ASSESSMENT / PLAN:   Acute blood loss anemia, superimposed on ACD, w/ resultant Hemorrhagic shock (now resolved). Most likely lower GI bleed in setting of ileo-cecal colitis. P:  -hold asa  -f/u CBC -f/u ferritin level, if low will add FESO4 supplementation  -cont PPI - cipro/flagyl x 7ds total -advance diet   Acute on chronic respiratory failure 2nd to profound anemia with hx of GOLD C COPD and Stage IIIB NSCLC. P:   -oxygen to keep SpO2 > 90% -CXR as needed -Resume spiriva -prn albuterol  DM type II. P:   -continue SSI    Code status >> no CPR, no defibrillation.  Okay with short term intubation >> would not want long term vent support.  Dispo: home 8/7 if tolerating diet   Cyril Mourning MD. FCCP. [facility] Pager [phone] If no response call [phone]

## 2012-10-18 MED ORDER — PANTOPRAZOLE SODIUM 40 MG PO TBEC: 40.0000 mg | DELAYED_RELEASE_TABLET | Freq: Every day | ORAL | Status: DC

## 2012-10-18 MED ORDER — HEPARIN SOD (PORK) LOCK FLUSH 100 UNIT/ML IV SOLN
500.0000 [IU] | INTRAVENOUS | Status: AC | PRN
  Administered 2012-10-18: 500 [IU]

## 2012-10-18 NOTE — Progress Notes (Signed)
Voices understanding of d/c instructions.  No changes noted since am assessment. D/c home vis wheelchair.

## 2012-10-25 ENCOUNTER — Encounter: Payer: Self-pay | Admitting: Internal Medicine

## 2012-10-25 ENCOUNTER — Ambulatory Visit (INDEPENDENT_AMBULATORY_CARE_PROVIDER_SITE_OTHER): Payer: Medicare Other | Admitting: Internal Medicine

## 2012-10-25 VITALS — BP 104/64 | HR 109 | Temp 98.6°F | Ht 70.0 in | Wt 171.0 lb

## 2012-10-25 DIAGNOSIS — J9610 Chronic respiratory failure, unspecified whether with hypoxia or hypercapnia: Secondary | ICD-10-CM

## 2012-10-25 DIAGNOSIS — J441 Chronic obstructive pulmonary disease with (acute) exacerbation: Secondary | ICD-10-CM

## 2012-10-25 DIAGNOSIS — J449 Chronic obstructive pulmonary disease, unspecified: Secondary | ICD-10-CM

## 2012-10-25 NOTE — Patient Instructions (Addendum)
Plan A = automatic = spiriva  Plan B = backup Only use your albuterol nebulizer as a rescue medication to be used if you can't catch your breath by resting or doing a relaxed purse lip breathing pattern. The less you use it, the better it will work when you need it. Ok to every 4 hours if needed , but if you note the need goes up significantly you need to come to see Dr Delford Field, Babette Relic NP, Dr Sherene Sires

## 2012-10-25 NOTE — Progress Notes (Signed)
Subjective:    Patient ID: [name], male    DOB: 1935-12-15 .   MRN: [id number]  Admit date: 10/14/2012  Discharge date: 10/18/2012  Discharge Diagnoses:  Acute blood loss anemia  Lower GI bleed (had ileo-cecal colitis)  Anemia of chronic disease  Probable iron deficiency anemia  Acute on chronic respiratory failure in setting of profound anemia in setting of GOLD stage C COPD  Stage IIIB Non-small cell lung cancer  Diabetes type 2  Detailed Hospital Course:  This is a 89 yom w/ stage IIIB/VI NSCLC s/p last cycle of chemo on 7/8 w/ Docetaxel and GOLD stage C COPD. Presents to the ER on 8/3 w/ report of about 1 week of intermittent bloody stools and weakness. Then the am of 8/3 woke up reporting significant weakness f/b sudden incontinent episode of dk bloody diarrhea w/ associated light-headedness. On presentation to ER found to be hypotensive w/ Hgb of 5. PCCM was asked to admit.  He was initially admitted to the ICU. He received a total of 3 units PRBCs, was started on PPI to cover for possibility of UGI involvement and [name] was consulted. Of note had recent CT (head/chest/abd) ordered by heme/onc which showed localized colitis in terminal ileum/cecum on 7/29 for which he was started on flagyl and cipro. But did not suggest disease progression. His hgb stabilized. GI felt that this was primarily Lower GI bleeding as there was no rise in BUN, and risk benefit of EGD weighed more heavily to risk side given his poor baseline pulmonary status. Final recommendations for GI were to consider conservative measures, and slowly advance diet. His hgb remained stable. His diet was advanced and he was eventually deemed safe for discharge. He completed a total of 7 days empiric antibiotics for colitis. He will be discharge to home under the care of his wife.     10/25/2012 post hosp f/u ov/Wert tapering prednisone/ back to baseline on 02 Chief Complaint  Patient presents with  . HFU    Pt reports  his breathing is back at baseline for him and he denies any new co's today.    does ok to room to room  2.5 24/7 x go up to 3 if needed with exertion Taking spiriva each  Taking protonix 30 min ac   No obvious daytime variabilty or assoc chronic cough or cp or chest tightness, subjective wheeze overt sinus or hb symptoms. No unusual exp hx or h/o childhood pna/ asthma or knowledge of premature birth.   Sleeping ok without nocturnal  or early am exacerbation  of respiratory  c/o's or need for noct saba. Also denies any obvious fluctuation of symptoms with weather or environmental changes or other aggravating or alleviating factors except as outlined above   Current Medications, Allergies, Past Medical History, Past Surgical History, Family History, and Social History were reviewed in [employer] record.  ROS  The following are not active complaints unless bolded sore throat, dysphagia, dental problems, itching, sneezing,  nasal congestion or excess/ purulent secretions, ear ache,   fever, chills, sweats, unintended wt loss, pleuritic or exertional cp, hemoptysis,  orthopnea pnd or leg swelling, presyncope, palpitations, heartburn, abdominal pain, anorexia, nausea, vomiting, diarrhea  or change in bowel or urinary habits, change in stools or urine, dysuria,hematuria,  rash, arthralgias, visual complaints, headache, numbness weakness or ataxia or problems with walking or coordination,  change in mood/affect or memory.  Objective:   Physical Exam  amb chronically ill nad    Wt Readings from Last 3 Encounters:  10/25/12 171 lb (77.565 kg)  10/16/12 173 lb 1 oz (78.5 kg)  10/11/12 169 lb 11.2 oz (76.975 kg)    HEENT mild turbinate edema.  Oropharynx no thrush or excess pnd or cobblestoning.  No JVD or cervical adenopathy. Mild accessory muscle hypertrophy. Trachea midline, nl thryroid. Chest was hyperinflated by percussion with diminished breath sounds and  moderate increased exp time without wheeze. Hoover sign positive at mid inspiration. Regular rate and rhythm without murmur gallop or rub or increase P2 or edema.  Abd: no hsm, nl excursion. Ext warm without cyanosis or clubbing.    Chest and abd  Ct 10/09/12:  1. Mild interval contraction and central cavitation of the left  lower lobe mass.  2. Perilesional pneumonitis and consolidation in the left lower  lobe is similar to prior.  1. No evidence of metastasis in the abdomen or pelvis.  2. New inflammation involving the terminal ileum and cecum.  Recommend correlation with enteritis or typhlitis. There are  multiple diverticula in this region so diverticulitis is also a  consideration.      Assessment & Plan:

## 2012-10-27 DIAGNOSIS — J449 Chronic obstructive pulmonary disease, unspecified: Secondary | ICD-10-CM | POA: Insufficient documentation

## 2012-10-27 DIAGNOSIS — J9610 Chronic respiratory failure, unspecified whether with hypoxia or hypercapnia: Secondary | ICD-10-CM | POA: Insufficient documentation

## 2012-10-27 NOTE — Assessment & Plan Note (Signed)
Rx as of 10/25/12 2.5 lpm at rest, 3lpm with exertion  Adequate control on present rx, reviewed > no change in rx needed

## 2012-10-27 NOTE — Assessment & Plan Note (Signed)
Spirometry 12/10 /13  FEV1  1.82 (58%) ratio 57   GOLD II criteria and in retrospect not really clear his admit was related to aecopd or evern copd but doing well back on maint rx    Each maintenance medication was reviewed in detail including most importantly the difference between maintenance and as needed and under what circumstances the prns are to be used.  Please see instructions for details which were reviewed in writing and the patient given a copy.

## 2012-12-11 ENCOUNTER — Ambulatory Visit: Payer: Medicare Other | Admitting: Critical Care Medicine

## 2012-12-27 ENCOUNTER — Ambulatory Visit (INDEPENDENT_AMBULATORY_CARE_PROVIDER_SITE_OTHER): Payer: Medicare Other | Admitting: Critical Care Medicine

## 2012-12-27 ENCOUNTER — Encounter: Payer: Self-pay | Admitting: Critical Care Medicine

## 2012-12-27 VITALS — BP 126/68 | HR 93 | Temp 98.0°F | Ht 70.5 in | Wt 187.0 lb

## 2012-12-27 DIAGNOSIS — J449 Chronic obstructive pulmonary disease, unspecified: Secondary | ICD-10-CM

## 2012-12-27 MED ORDER — TIOTROPIUM BROMIDE MONOHYDRATE 18 MCG IN CAPS: 18.0000 ug | ORAL_CAPSULE | Freq: Every day | RESPIRATORY_TRACT | Status: DC

## 2012-12-27 NOTE — Patient Instructions (Signed)
Stay on spiriva daily Return 2 months

## 2012-12-27 NOTE — Progress Notes (Signed)
Subjective:    Patient ID: [name]., male    DOB: 11-17-1935, 77 y.o.   MRN: [id number]  HPI  Subjective:     12/27/2012 Chief Complaint  Patient presents with  . 2 month follow up    Breathing is some better since last OV.  Does have DOE, wheezing occasionally, and coughing mostly qhs with occas clear to yellow tinted mucus.  Dyspnea is better.  No diarrhea. Notes DOE.  Notes clr to yelow mucus. No real wheeze.  Stopped Chemorx for lung cancer. Now in a holiday for chemorx  Another CT chest is pending for 10/30.  Abd/chest ordered           Objective:   Physical Exam  Filed Vitals:   12/27/12 1110  BP: 126/68  Pulse: 93  Temp: 98 F (36.7 C)  TempSrc: Oral  Height: 5' 10.5" (1.791 m)  Weight: 187 lb (84.823 kg)  SpO2: 100%    Gen: Pleasant, well-nourished, in no distress,  normal affect  ENT: No lesions,  mouth clear,  oropharynx clear, no postnasal drip  Neck: No JVD, no TMG, no carotid bruits  Lungs: No use of accessory muscles, no dullness to percussion, clear without rales or rhonchi  Cardiovascular: RRR, heart sounds normal, no murmur or gallops, no peripheral edema  Abdomen: soft and NT, no HSM,  BS normal  Musculoskeletal: No deformities, no cyanosis or clubbing  Neuro: alert, non focal  Skin: Warm, no lesions or rashes  No results found.     Wt Readings from Last 3 Encounters:  12/27/12 187 lb (84.823 kg)  10/25/12 171 lb (77.565 kg)  10/16/12 173 lb 1 oz (78.5 kg)        Assessment & Plan:   Obstructive chronic bronchitis without exacerbation Gold C Gold C Copd stable at present Plan Cont spiriva daily   Updated Medication List Outpatient Encounter Prescriptions as of 12/27/2012  Medication Sig Dispense Refill  . albuterol (PROVENTIL) (5 MG/ML) 0.5% nebulizer solution Take 0.5 mLs (2.5 mg total) by nebulization every 4 (four) hours as needed for wheezing.  20 mL  3  . ALPRAZolam (XANAX) 0.5 MG tablet Take 0.5 mg by  mouth at bedtime as needed for sleep or anxiety.      Marland Kitchen glimepiride (AMARYL) 4 MG tablet Take 8 mg by mouth daily. 2 tabs daily with breakfast      . lactose free nutrition (BOOST PLUS) LIQD Take 237 mLs by mouth 2 (two) times daily as needed.      . latanoprost (XALATAN) 0.005 % ophthalmic solution Place 1 drop into both eyes at bedtime.       Marland Kitchen nystatin (MYCOSTATIN) 100000 UNIT/ML suspension Take 10,000 Units by mouth 4 (four) times daily. As needed for thrush      . ondansetron (ZOFRAN) 8 MG tablet Take 1 tablet (8 mg total) by mouth every 8 (eight) hours as needed (as needed for nausea and vomiting).  30 tablet  1  . pioglitazone (ACTOS) 30 MG tablet Take 30 mg by mouth daily.       . prochlorperazine (COMPAZINE) 10 MG tablet Take 1 tablet (10 mg total) by mouth every 6 (six) hours as needed.  30 tablet  0  . sitaGLIPtin (JANUVIA) 100 MG tablet Take 100 mg by mouth daily.      Marland Kitchen tiotropium (SPIRIVA HANDIHALER) 18 MCG inhalation capsule Place 1 capsule (18 mcg total) into inhaler and inhale daily.  30 capsule  6  . [  DISCONTINUED] lactose free nutrition (BOOST PLUS) LIQD Take 237 mLs by mouth 2 (two) times daily between meals.      . [DISCONTINUED] tiotropium (SPIRIVA HANDIHALER) 18 MCG inhalation capsule Place 1 capsule (18 mcg total) into inhaler and inhale daily.  30 capsule  6  . pantoprazole (PROTONIX) 40 MG tablet Take 1 tablet (40 mg total) by mouth daily.  30 tablet  0  . [DISCONTINUED] dexamethasone (DECADRON) 4 MG tablet Take 1 tablet (4 mg total) by mouth as directed. 2 tablets BID day before, day of, day after chemotherapy.  40 tablet  1  . [DISCONTINUED] PRESCRIPTION MEDICATION Inject 1 Syringe into the vein every 7 (seven) days. DOCEtaxel (TAXOTERE) 50 mg in sodium chloride 0.9 % 250 mL chemo infusion 25 mg/m2  1.98 m2 (Treatment Plan Actual)  Once 09/18/2012 09/18/2012. For a total of 9 treatments. Has 3 more to go. Dr. Arbutus Ped.       Facility-Administered Encounter Medications as of  12/27/2012  Medication Dose Route Frequency Provider Last Rate Last Dose  . sodium chloride 0.9 % injection 10 mL  10 mL Intracatheter PRN Si Gaul, MD   10 mL at 11/24/11 1637           Review of Systems     Objective:   Physical Exam        Assessment & Plan:

## 2012-12-29 NOTE — Assessment & Plan Note (Signed)
Gold C Copd stable at present Plan Cont spiriva daily

## 2013-01-10 ENCOUNTER — Encounter (HOSPITAL_COMMUNITY): Payer: Self-pay

## 2013-01-10 ENCOUNTER — Ambulatory Visit (HOSPITAL_COMMUNITY)
Admission: RE | Admit: 2013-01-10 | Discharge: 2013-01-10 | Disposition: A | Discharge status: Home / Self Care | Payer: Medicare Other | Source: Ambulatory Visit | Attending: Internal Medicine | Admitting: Internal Medicine

## 2013-01-10 ENCOUNTER — Other Ambulatory Visit: Payer: Medicare Other | Admitting: Lab

## 2013-01-10 ENCOUNTER — Other Ambulatory Visit (HOSPITAL_BASED_OUTPATIENT_CLINIC_OR_DEPARTMENT_OTHER): Payer: Medicare Other

## 2013-01-10 DIAGNOSIS — N281 Cyst of kidney, acquired: Secondary | ICD-10-CM | POA: Insufficient documentation

## 2013-01-10 DIAGNOSIS — C3430 Malignant neoplasm of lower lobe, unspecified bronchus or lung: Secondary | ICD-10-CM

## 2013-01-10 DIAGNOSIS — K5730 Diverticulosis of large intestine without perforation or abscess without bleeding: Secondary | ICD-10-CM | POA: Insufficient documentation

## 2013-01-10 DIAGNOSIS — C3490 Malignant neoplasm of unspecified part of unspecified bronchus or lung: Secondary | ICD-10-CM | POA: Insufficient documentation

## 2013-01-10 DIAGNOSIS — J90 Pleural effusion, not elsewhere classified: Secondary | ICD-10-CM | POA: Insufficient documentation

## 2013-01-10 DIAGNOSIS — J438 Other emphysema: Secondary | ICD-10-CM | POA: Insufficient documentation

## 2013-01-10 DIAGNOSIS — R599 Enlarged lymph nodes, unspecified: Secondary | ICD-10-CM | POA: Insufficient documentation

## 2013-01-10 LAB — CBC WITH DIFFERENTIAL/PLATELET
BASO%: 0.1 % (ref 0.0–2.0)
Eosinophils Absolute: 0.6 10*3/uL — ABNORMAL HIGH (ref 0.0–0.5)
HCT: 35.3 % — ABNORMAL LOW (ref 38.4–49.9)
LYMPH%: 19.5 % (ref 14.0–49.0)
MCHC: 33.3 g/dL (ref 32.0–36.0)
MCV: 94.8 fL (ref 79.3–98.0)
MONO#: 0.6 10*3/uL (ref 0.1–0.9)
MONO%: 8.7 % (ref 0.0–14.0)
NEUT%: 63.5 % (ref 39.0–75.0)
Platelets: 222 10*3/uL (ref 140–400)
RBC: 3.72 10*6/uL — ABNORMAL LOW (ref 4.20–5.82)
WBC: 6.8 10*3/uL (ref 4.0–10.3)

## 2013-01-10 LAB — COMPREHENSIVE METABOLIC PANEL (CC13)
Alkaline Phosphatase: 64 U/L (ref 40–150)
Anion Gap: 13 mEq/L — ABNORMAL HIGH (ref 3–11)
CO2: 25 mEq/L (ref 22–29)
Creatinine: 0.9 mg/dL (ref 0.7–1.3)
Glucose: 100 mg/dl (ref 70–140)
Sodium: 144 mEq/L (ref 136–145)
Total Bilirubin: 0.49 mg/dL (ref 0.20–1.20)

## 2013-01-10 MED ORDER — IOHEXOL 300 MG/ML  SOLN
100.0000 mL | Freq: Once | INTRAMUSCULAR | Status: AC | PRN
  Administered 2013-01-10: 100 mL via INTRAVENOUS

## 2013-01-14 ENCOUNTER — Telehealth: Payer: Self-pay | Admitting: Internal Medicine

## 2013-01-14 ENCOUNTER — Ambulatory Visit (HOSPITAL_BASED_OUTPATIENT_CLINIC_OR_DEPARTMENT_OTHER): Payer: Medicare Other | Admitting: Internal Medicine

## 2013-01-14 ENCOUNTER — Encounter: Payer: Self-pay | Admitting: Internal Medicine

## 2013-01-14 DIAGNOSIS — E119 Type 2 diabetes mellitus without complications: Secondary | ICD-10-CM

## 2013-01-14 DIAGNOSIS — C3430 Malignant neoplasm of lower lobe, unspecified bronchus or lung: Secondary | ICD-10-CM

## 2013-01-14 DIAGNOSIS — I10 Essential (primary) hypertension: Secondary | ICD-10-CM

## 2013-01-14 NOTE — Patient Instructions (Signed)
CURRENT THERAPY: None.  CHEMOTHERAPY INTENT: Palliative  CURRENT # OF CHEMOTHERAPY CYCLES: 0  CURRENT ANTIEMETICS: Zofran, dexamethasone and Compazine  CURRENT SMOKING STATUS: Currently nonsmoker  ORAL CHEMOTHERAPY AND CONSENT: None  CURRENT BISPHOSPHONATES USE: None  PAIN MANAGEMENT: 1/10. Ibuprofen when necessary.  NARCOTICS INDUCED CONSTIPATION: No constipation  LIVING WILL AND CODE STATUS: No CODE BLUE.

## 2013-01-14 NOTE — Telephone Encounter (Signed)
gv and printed appt sched and avs for pt for DEC thru March 2015....gv pt barium

## 2013-01-14 NOTE — Progress Notes (Signed)
[facility] [phone]   [phone]  OFFICE PROGRESS NOTE  [name], MD [address] [place] [address]  PRINCIPAL DIAGNOSIS AND STAGE: Stage IIIB/IV non-small cell lung cancer, squamous cell carcinoma diagnosed in June 2012.   PRIOR THERAPY:  1) Status post a course of concurrent chemoradiation with weekly carboplatin and paclitaxel. Last dose of chemotherapy was given on November 01, 2010.  2) Systemic chemotherapy with carboplatin for an AUC of 5 given on day 1 and gemcitabine 1000 mg per meter square given on days 1 and 8 every 3 weeks status post 1 cycle. However due to thrombocytopenia from cycle 2 forward he will proceed with carboplatin for an AUC of 5 given on day 1 and gemcitabine at 800 mg per meter squared given on days 1 and 8 every 3 weeks. Status post 6 cycles with evidence for disease progression after cycle #6.  3) Palliative radiotherapy to the enlarging left lower lobe lung mass under the care of Dr. Kathrynn Running.  4) Systemic chemotherapy with docetaxel 75 mg/M2 every 3 weeks with Neulasta support, status post 2 cycles, first cycle was started on 06/12/2012. That was discontinued today secondary to intolerance.  5) Systemic chemotherapy with docetaxel 25 mg/M2 every week, first cycle is expected on 08/08/2012. Status post 8 doses.   CURRENT THERAPY: None.   CHEMOTHERAPY INTENT: Palliative  CURRENT # OF CHEMOTHERAPY CYCLES: 0   CURRENT ANTIEMETICS: Zofran, dexamethasone and Compazine  CURRENT SMOKING STATUS: Currently nonsmoker  ORAL CHEMOTHERAPY AND CONSENT: None  CURRENT BISPHOSPHONATES USE: None  PAIN MANAGEMENT: 1/10. Ibuprofen when necessary.  NARCOTICS INDUCED CONSTIPATION: No constipation  LIVING WILL AND CODE STATUS: No CODE BLUE.   INTERVAL HISTORY: [name]. 77 y.o. male returns to the clinic today for followup visit accompanied by his wife. The patient is feeling much better  today and has more energy. He denied having any significant chest pain but continues to have shortness breath with exertion still on home oxygen. The patient denied having any cough or hemoptysis. He has no significant weight loss or night sweats. The patient denied having any nausea or vomiting. The patient has repeat CT scan of the chest, abdomen and pelvis performed recently and he is here for evaluation and discussion of his scan results.  MEDICAL HISTORY: Past Medical History  Diagnosis Date  . Diabetes mellitus   . HTN (hypertension)   . High cholesterol   . Hx of radiation therapy 09/22/10 - 11/05/10    LLL lung  . History of chemotherapy   . History of radiation therapy 04/24/12-05/15/12    lllung 35Gy/14dx  . Lung cancer 2012    recurrence 12/2011    ALLERGIES:  is allergic to codeine and penicillins.  MEDICATIONS:  Current Outpatient Prescriptions  Medication Sig Dispense Refill  . albuterol (PROVENTIL) (5 MG/ML) 0.5% nebulizer solution Take 0.5 mLs (2.5 mg total) by nebulization every 4 (four) hours as needed for wheezing.  20 mL  3  . ALPRAZolam (XANAX) 0.5 MG tablet Take 0.5 mg by mouth at bedtime as needed for sleep or anxiety.      Marland Kitchen glimepiride (AMARYL) 4 MG tablet Take 8 mg by mouth daily. 2 tabs daily with breakfast      . lactose free nutrition (BOOST PLUS) LIQD Take 237 mLs by mouth 2 (two) times daily as needed.      . latanoprost (XALATAN) 0.005 % ophthalmic solution Place 1 drop into both eyes  at bedtime.       Marland Kitchen nystatin (MYCOSTATIN) 100000 UNIT/ML suspension Take 10,000 Units by mouth 4 (four) times daily. As needed for thrush      . ondansetron (ZOFRAN) 8 MG tablet Take 1 tablet (8 mg total) by mouth every 8 (eight) hours as needed (as needed for nausea and vomiting).  30 tablet  1  . pantoprazole (PROTONIX) 40 MG tablet Take 1 tablet (40 mg total) by mouth daily.  30 tablet  0  . pioglitazone (ACTOS) 30 MG tablet Take 30 mg by mouth daily.       . prochlorperazine  (COMPAZINE) 10 MG tablet Take 1 tablet (10 mg total) by mouth every 6 (six) hours as needed.  30 tablet  0  . sitaGLIPtin (JANUVIA) 100 MG tablet Take 100 mg by mouth daily.      Marland Kitchen tiotropium (SPIRIVA HANDIHALER) 18 MCG inhalation capsule Place 1 capsule (18 mcg total) into inhaler and inhale daily.  30 capsule  6   No current facility-administered medications for this visit.   Facility-Administered Medications Ordered in Other Visits  Medication Dose Route Frequency Provider Last Rate Last Dose  . sodium chloride 0.9 % injection 10 mL  10 mL Intracatheter PRN Si Gaul, MD   10 mL at 11/24/11 1637    SURGICAL HISTORY:  Past Surgical History  Procedure Laterality Date  . Tonsillectomy    . Gastrostomy tube placement  2011    has been removed  . Portacath placement  08/2010    REVIEW OF SYSTEMS:  A comprehensive review of systems was negative except for: Constitutional: positive for anorexia, fatigue and weight loss Respiratory: positive for dyspnea on exertion Musculoskeletal: positive for muscle weakness   PHYSICAL EXAMINATION: General appearance: alert, cooperative, fatigued and no distress Head: Normocephalic, without obvious abnormality, atraumatic Neck: no adenopathy Lymph nodes: Cervical, supraclavicular, and axillary nodes normal. Resp: clear to auscultation bilaterally Cardio: regular rate and rhythm, S1, S2 normal, no murmur, click, rub or gallop GI: soft, non-tender; bowel sounds normal; no masses,  no organomegaly Extremities: extremities normal, atraumatic, no cyanosis or edema Neurologic: Alert and oriented X 3, normal strength and tone. Normal symmetric reflexes. Normal coordination and gait  ECOG PERFORMANCE STATUS: 2 - Symptomatic, <50% confined to bed  Blood pressure 118/53, pulse 96, temperature 97.7 F (36.5 C), temperature source Oral, resp. rate 17, height 5\' 10"  (1.778 m), weight 184 lb (83.462 kg), SpO2 100.00%.  LABORATORY DATA: Lab Results   Component Value Date   WBC 6.8 01/10/2013   HGB 11.8* 01/10/2013   HCT 35.3* 01/10/2013   MCV 94.8 01/10/2013   PLT 222 01/10/2013      Chemistry      Component Value Date/Time   NA 144 01/10/2013 0955   NA 138 10/17/2012 0615   NA 134 10/19/2011 0817   K 4.4 01/10/2013 0955   K 3.4* 10/17/2012 0615   K 4.5 10/19/2011 0817   CL 104 10/17/2012 0615   CL 102 09/04/2012 1017   CL 99 10/19/2011 0817   CO2 25 01/10/2013 0955   CO2 27 10/17/2012 0615   CO2 27 10/19/2011 0817   BUN 17.9 01/10/2013 0955   BUN 6 10/17/2012 0615   BUN 13 10/19/2011 0817   CREATININE 0.9 01/10/2013 0955   CREATININE 0.82 10/17/2012 0615   CREATININE 1.4* 10/19/2011 0817      Component Value Date/Time   CALCIUM 10.0 01/10/2013 0955   CALCIUM 8.1* 10/17/2012 0615   CALCIUM 9.3 10/19/2011  0817   ALKPHOS 64 01/10/2013 0955   ALKPHOS 77 07/12/2012 0520   ALKPHOS 48 10/19/2011 0817   AST 23 01/10/2013 0955   AST 12 07/12/2012 0520   AST 19 10/19/2011 0817   ALT 28 01/10/2013 0955   ALT 13 07/12/2012 0520   ALT 26 10/19/2011 0817   BILITOT 0.49 01/10/2013 0955   BILITOT 0.6 07/12/2012 0520   BILITOT 0.90 10/19/2011 0817       RADIOGRAPHIC STUDIES: Ct Chest W Contrast  01/10/2013 CLINICAL DATA: Restaging for left lung carcinoma. EXAM: CT CHEST, ABDOMEN, AND PELVIS WITH CONTRAST TECHNIQUE: Multidetector CT imaging of the chest, abdomen and pelvis was performed following the standard protocol during bolus administration of intravenous contrast. CONTRAST: OMNIPAQUE IOHEXOL 300 MG/ML SOLN COMPARISON: 10/09/2012 FINDINGS:  CT CHEST FINDINGS Low attenuation mass in the central left lower lobe shows no significant change in size, currently measuring 4.4 x 4.9 cm compared to 4.3 x 4.9 cm previously. Mild increase in size of small left pleural effusion and adjacent left lower lobe atelectasis is noted. No other suspicious pulmonary nodules or masses are identified. Moderate to severe emphysema again noted. Subcarinal mediastinal soft tissue  density remains stable measuring 1.1 cm in short axis. No other pathologically enlarged nodes identified within the thorax. No evidence of chest wall mass or suspicious bone lesions.  CT ABDOMEN AND PELVIS FINDINGS The liver, gallbladder, pancreas, spleen, and adrenal glands are normal in appearance. Small left renal cysts again noted, however there is no evidence of renal masses or hydronephrosis. No soft tissue masses or lymphadenopathy seen elsewhere within the abdomen or pelvis. No evidence of inflammatory process or abnormal fluid collections. Normal appendix is visualized. Colonic diverticulosis is noted, however there is no evidence of diverticulitis. Previously seen wall thickening involving the terminal ileum cecum is no longer visualized. No evidence of dilated bowel loops or hernia. No suspicious bone lesions identified.  IMPRESSION: No significant change in size of central left lower lobe mass and mild subcarinal lymphadenopathy. Mild increase in size of small left pleural effusion and associated left lower lobe atelectasis. No evidence of abdominal or pelvic metastatic disease. Diverticulosis. No radiographic evidence of diverticulitis. Electronically Signed By: Myles Rosenthal M.D. On: 01/10/2013 11:57   ASSESSMENT AND PLAN: This is a very pleasant 77 years old white male with stage IIIB/4 non-small cell lung cancer, squamous cell carcinoma. His recent CT scan of the chest, abdomen and pelvis showed no significant evidence for disease progression.  I discussed the scan results with the patient and his wife. I recommended for him to continue on observation. I will see the patient back for followup visit in 3 months with repeat CT scan of the chest, abdomen and pelvis for restaging of his disease. He was advised to call immediately if he has any concerning symptoms in the interval. The patient voices understanding of current disease status and treatment options and is in agreement with the current care  plan.  All questions were answered. The patient knows to call the clinic with any problems, questions or concerns. We can certainly see the patient much sooner if necessary.

## 2013-02-21 ENCOUNTER — Ambulatory Visit (HOSPITAL_BASED_OUTPATIENT_CLINIC_OR_DEPARTMENT_OTHER): Payer: Medicare Other

## 2013-02-21 DIAGNOSIS — Z452 Encounter for adjustment and management of vascular access device: Secondary | ICD-10-CM

## 2013-02-21 DIAGNOSIS — C3430 Malignant neoplasm of lower lobe, unspecified bronchus or lung: Secondary | ICD-10-CM

## 2013-02-21 MED ORDER — HEPARIN SOD (PORK) LOCK FLUSH 100 UNIT/ML IV SOLN
500.0000 [IU] | Freq: Once | INTRAVENOUS | Status: AC
  Administered 2013-02-21: 500 [IU] via INTRAVENOUS
  Filled 2013-02-21: qty 5

## 2013-02-21 MED ORDER — SODIUM CHLORIDE 0.9 % IJ SOLN
10.0000 mL | INTRAMUSCULAR | Status: DC | PRN
  Administered 2013-02-21: 10 mL via INTRAVENOUS
  Filled 2013-02-21: qty 10

## 2013-02-25 ENCOUNTER — Encounter: Payer: Self-pay | Admitting: Critical Care Medicine

## 2013-02-25 ENCOUNTER — Ambulatory Visit (INDEPENDENT_AMBULATORY_CARE_PROVIDER_SITE_OTHER): Payer: Medicare Other | Admitting: Critical Care Medicine

## 2013-02-25 VITALS — BP 108/72 | HR 86 | Temp 98.2°F | Ht 70.5 in | Wt 189.0 lb

## 2013-02-25 DIAGNOSIS — J439 Emphysema, unspecified: Secondary | ICD-10-CM

## 2013-02-25 DIAGNOSIS — J449 Chronic obstructive pulmonary disease, unspecified: Secondary | ICD-10-CM

## 2013-02-25 DIAGNOSIS — J438 Other emphysema: Secondary | ICD-10-CM

## 2013-02-25 MED ORDER — TIOTROPIUM BROMIDE MONOHYDRATE 18 MCG IN CAPS: 18.0000 ug | ORAL_CAPSULE | Freq: Every day | RESPIRATORY_TRACT | Status: AC

## 2013-02-25 NOTE — Patient Instructions (Signed)
No changes in inhalers A referral to pulmonary rehab will be made Return 3 months

## 2013-02-25 NOTE — Assessment & Plan Note (Signed)
Gold stage C. COPD with chronic bronchitis stable at this time Chronic respiratory failure also stable at this time Plan Maintain Spiriva Referral to pulmonary rehabilitation was made

## 2013-02-25 NOTE — Progress Notes (Signed)
Subjective:    Patient ID: Steven Bucker., male    DOB: 09-Aug-1935, 77 y.o.   MRN: [id number]  HPI   Subjective:     12/27/2012 Chief Complaint  Patient presents with  . 2 month follow up    Breathing is some better since last OV.  Does have DOE, wheezing occasionally, and coughing mostly qhs with occas clear to yellow tinted mucus.  Dyspnea is better.  No diarrhea. Notes DOE.  Notes clr to yelow mucus. No real wheeze.  Stopped Chemorx for lung cancer. Now in a holiday for chemorx  Another CT chest is pending for 10/30.  Abd/chest ordered    02/25/2013 Chief Complaint  Patient presents with  . COPD    Breathing is unchanged. Reports DOE, coughing once in a while. Denies wheezing or chest tightness. Has not had to use nebulizer as much.   CTs 12/2012: no spread out of chest and unchanged mass size but necrotic center.  Dyspnea is unchanged.   Next set of scans 03/2013. Pt needs pulm rehab.        Objective:   Physical Exam  Filed Vitals:   02/25/13 1121 02/25/13 1122  BP:  108/72  Pulse:  86  Temp: 98.2 F (36.8 C)   TempSrc: Oral   Height: 5' 10.5" (1.791 m)   Weight: 189 lb (85.73 kg)   SpO2:  100%    Gen: Pleasant, well-nourished, in no distress,  normal affect  ENT: No lesions,  mouth clear,  oropharynx clear, no postnasal drip  Neck: No JVD, no TMG, no carotid bruits  Lungs: No use of accessory muscles, no dullness to percussion, clear without rales or rhonchi  Cardiovascular: RRR, heart sounds normal, no murmur or gallops, no peripheral edema  Abdomen: soft and NT, no HSM,  BS normal  Musculoskeletal: No deformities, no cyanosis or clubbing  Neuro: alert, non focal  Skin: Warm, no lesions or rashes  No results found.     Wt Readings from Last 3 Encounters:  02/25/13 189 lb (85.73 kg)  01/14/13 184 lb (83.462 kg)  12/27/12 187 lb (84.823 kg)        Assessment & Plan:   Obstructive chronic bronchitis without exacerbation Gold C Gold  stage C. COPD with chronic bronchitis stable at this time Chronic respiratory failure also stable at this time Plan Maintain Spiriva Referral to pulmonary rehabilitation was made    Updated Medication List Outpatient Encounter Prescriptions as of 02/25/2013  Medication Sig  . albuterol (PROVENTIL) (5 MG/ML) 0.5% nebulizer solution Take 0.5 mLs (2.5 mg total) by nebulization every 4 (four) hours as needed for wheezing.  Marland Kitchen atorvastatin (LIPITOR) 20 MG tablet Take 20 mg by mouth daily.  Marland Kitchen BESIVANCE 0.6 % SUSP Place 1 drop into both eyes 2 (two) times daily.  Marland Kitchen glimepiride (AMARYL) 4 MG tablet Take 8 mg by mouth daily.   Marland Kitchen lactose free nutrition (BOOST PLUS) LIQD Take 237 mLs by mouth 2 (two) times daily as needed.  . latanoprost (XALATAN) 0.005 % ophthalmic solution Place 1 drop into both eyes at bedtime.   . LOTEMAX 0.5 % GEL Place 4 drops into both eyes daily.  Marland Kitchen nystatin (MYCOSTATIN) 100000 UNIT/ML suspension Take 10,000 Units by mouth 4 (four) times daily. As needed for thrush  . pioglitazone (ACTOS) 30 MG tablet Take 30 mg by mouth daily.   Marland Kitchen PROLENSA 0.07 % SOLN Place 1 drop into both eyes daily.  . sitaGLIPtin (JANUVIA) 100 MG tablet Take  100 mg by mouth daily.  Marland Kitchen tiotropium (SPIRIVA HANDIHALER) 18 MCG inhalation capsule Place 1 capsule (18 mcg total) into inhaler and inhale daily.  . [DISCONTINUED] tiotropium (SPIRIVA HANDIHALER) 18 MCG inhalation capsule Place 1 capsule (18 mcg total) into inhaler and inhale daily.  . [DISCONTINUED] ALPRAZolam (XANAX) 0.5 MG tablet Take 0.5 mg by mouth at bedtime as needed for sleep or anxiety.  . [DISCONTINUED] ondansetron (ZOFRAN) 8 MG tablet Take 1 tablet (8 mg total) by mouth every 8 (eight) hours as needed (as needed for nausea and vomiting).  . [DISCONTINUED] pantoprazole (PROTONIX) 40 MG tablet Take 1 tablet (40 mg total) by mouth daily.  . [DISCONTINUED] prochlorperazine (COMPAZINE) 10 MG tablet Take 1 tablet (10 mg total) by mouth every 6  (six) hours as needed.           Review of Systems      Objective:   Physical Exam         Assessment & Plan:

## 2013-03-13 ENCOUNTER — Other Ambulatory Visit: Payer: Self-pay | Admitting: Critical Care Medicine

## 2013-04-01 ENCOUNTER — Encounter (HOSPITAL_COMMUNITY): Payer: Self-pay

## 2013-04-01 ENCOUNTER — Other Ambulatory Visit (HOSPITAL_BASED_OUTPATIENT_CLINIC_OR_DEPARTMENT_OTHER): Payer: Medicare Other

## 2013-04-01 ENCOUNTER — Ambulatory Visit (HOSPITAL_COMMUNITY)
Admission: RE | Admit: 2013-04-01 | Discharge: 2013-04-01 | Disposition: A | Discharge status: Home / Self Care | Payer: Medicare Other | Source: Ambulatory Visit | Attending: Internal Medicine | Admitting: Internal Medicine

## 2013-04-01 DIAGNOSIS — C3430 Malignant neoplasm of lower lobe, unspecified bronchus or lung: Secondary | ICD-10-CM

## 2013-04-01 DIAGNOSIS — Z9221 Personal history of antineoplastic chemotherapy: Secondary | ICD-10-CM | POA: Insufficient documentation

## 2013-04-01 DIAGNOSIS — M47817 Spondylosis without myelopathy or radiculopathy, lumbosacral region: Secondary | ICD-10-CM | POA: Insufficient documentation

## 2013-04-01 DIAGNOSIS — J449 Chronic obstructive pulmonary disease, unspecified: Secondary | ICD-10-CM | POA: Insufficient documentation

## 2013-04-01 DIAGNOSIS — J4489 Other specified chronic obstructive pulmonary disease: Secondary | ICD-10-CM | POA: Insufficient documentation

## 2013-04-01 DIAGNOSIS — I700 Atherosclerosis of aorta: Secondary | ICD-10-CM | POA: Insufficient documentation

## 2013-04-01 DIAGNOSIS — M47814 Spondylosis without myelopathy or radiculopathy, thoracic region: Secondary | ICD-10-CM | POA: Insufficient documentation

## 2013-04-01 DIAGNOSIS — N281 Cyst of kidney, acquired: Secondary | ICD-10-CM | POA: Insufficient documentation

## 2013-04-01 DIAGNOSIS — J90 Pleural effusion, not elsewhere classified: Secondary | ICD-10-CM | POA: Insufficient documentation

## 2013-04-01 DIAGNOSIS — J984 Other disorders of lung: Secondary | ICD-10-CM | POA: Insufficient documentation

## 2013-04-01 DIAGNOSIS — K8689 Other specified diseases of pancreas: Secondary | ICD-10-CM | POA: Insufficient documentation

## 2013-04-01 DIAGNOSIS — Z923 Personal history of irradiation: Secondary | ICD-10-CM | POA: Insufficient documentation

## 2013-04-01 LAB — CBC WITH DIFFERENTIAL/PLATELET
BASO%: 1.5 % (ref 0.0–2.0)
Basophils Absolute: 0.1 10*3/uL (ref 0.0–0.1)
EOS%: 5.4 % (ref 0.0–7.0)
Eosinophils Absolute: 0.4 10*3/uL (ref 0.0–0.5)
HCT: 36 % — ABNORMAL LOW (ref 38.4–49.9)
HGB: 12.1 g/dL — ABNORMAL LOW (ref 13.0–17.1)
LYMPH#: 1.5 10*3/uL (ref 0.9–3.3)
LYMPH%: 19.6 % (ref 14.0–49.0)
MCH: 31.9 pg (ref 27.2–33.4)
MCHC: 33.5 g/dL (ref 32.0–36.0)
MCV: 95.2 fL (ref 79.3–98.0)
MONO#: 0.6 10*3/uL (ref 0.1–0.9)
MONO%: 8.5 % (ref 0.0–14.0)
NEUT#: 5 10*3/uL (ref 1.5–6.5)
NEUT%: 65 % (ref 39.0–75.0)
Platelets: 263 10*3/uL (ref 140–400)
RBC: 3.78 10*6/uL — AB (ref 4.20–5.82)
RDW: 15.2 % — ABNORMAL HIGH (ref 11.0–14.6)
WBC: 7.6 10*3/uL (ref 4.0–10.3)

## 2013-04-01 LAB — COMPREHENSIVE METABOLIC PANEL (CC13)
ALT: 16 U/L (ref 0–55)
ANION GAP: 12 meq/L — AB (ref 3–11)
AST: 16 U/L (ref 5–34)
Albumin: 3.7 g/dL (ref 3.5–5.0)
Alkaline Phosphatase: 82 U/L (ref 40–150)
BILIRUBIN TOTAL: 0.75 mg/dL (ref 0.20–1.20)
BUN: 20.5 mg/dL (ref 7.0–26.0)
CO2: 27 meq/L (ref 22–29)
CREATININE: 1 mg/dL (ref 0.7–1.3)
Calcium: 10.4 mg/dL (ref 8.4–10.4)
Chloride: 102 mEq/L (ref 98–109)
Glucose: 106 mg/dl (ref 70–140)
Potassium: 4.8 mEq/L (ref 3.5–5.1)
SODIUM: 141 meq/L (ref 136–145)
TOTAL PROTEIN: 8 g/dL (ref 6.4–8.3)

## 2013-04-01 MED ORDER — IOHEXOL 300 MG/ML  SOLN
100.0000 mL | Freq: Once | INTRAMUSCULAR | Status: AC | PRN
  Administered 2013-04-01: 100 mL via INTRAVENOUS

## 2013-04-04 ENCOUNTER — Telehealth: Payer: Self-pay | Admitting: Internal Medicine

## 2013-04-04 ENCOUNTER — Other Ambulatory Visit: Payer: Medicare Other | Admitting: Lab

## 2013-04-04 ENCOUNTER — Ambulatory Visit (HOSPITAL_BASED_OUTPATIENT_CLINIC_OR_DEPARTMENT_OTHER): Payer: Medicare Other | Admitting: Internal Medicine

## 2013-04-04 ENCOUNTER — Encounter: Payer: Self-pay | Admitting: Internal Medicine

## 2013-04-04 VITALS — BP 144/71 | HR 110 | Temp 97.4°F | Resp 18 | Ht 70.5 in | Wt 189.3 lb

## 2013-04-04 DIAGNOSIS — C3490 Malignant neoplasm of unspecified part of unspecified bronchus or lung: Secondary | ICD-10-CM

## 2013-04-04 DIAGNOSIS — C3430 Malignant neoplasm of lower lobe, unspecified bronchus or lung: Secondary | ICD-10-CM

## 2013-04-04 NOTE — Progress Notes (Signed)
[facility] [phone]   [phone]  OFFICE PROGRESS NOTE  Leonard Downing, MD [place] [address]  PRINCIPAL DIAGNOSIS AND STAGE: Stage IIIB/IV non-small cell lung cancer, squamous cell carcinoma diagnosed in June 2012.   PRIOR THERAPY:  1) Status post a course of concurrent chemoradiation with weekly carboplatin and paclitaxel. Last dose of chemotherapy was given on November 01, 2010.  2) Systemic chemotherapy with carboplatin for an AUC of 5 given on day 1 and gemcitabine 1000 mg per meter square given on days 1 and 8 every 3 weeks status post 1 cycle. However due to thrombocytopenia from cycle 2 forward he will proceed with carboplatin for an AUC of 5 given on day 1 and gemcitabine at 800 mg per meter squared given on days 1 and 8 every 3 weeks. Status post 6 cycles with evidence for disease progression after cycle #6.  3) Palliative radiotherapy to the enlarging left lower lobe lung mass under the care of Dr. Tammi Klippel.  4) Systemic chemotherapy with docetaxel 75 mg/M2 every 3 weeks with Neulasta support, status post 2 cycles, first cycle was started on 06/12/2012. That was discontinued today secondary to intolerance.  5) Systemic chemotherapy with docetaxel 25 mg/M2 every week, first cycle is expected on 08/08/2012. Status post 8 doses.   CURRENT THERAPY: None.   CHEMOTHERAPY INTENT: Palliative  CURRENT # OF CHEMOTHERAPY CYCLES: 0   CURRENT ANTIEMETICS: Zofran, dexamethasone and Compazine  CURRENT SMOKING STATUS: Currently nonsmoker  ORAL CHEMOTHERAPY AND CONSENT: None  CURRENT BISPHOSPHONATES USE: None  PAIN MANAGEMENT: 1/10. Ibuprofen when necessary.  NARCOTICS INDUCED CONSTIPATION: No constipation  LIVING WILL AND CODE STATUS: No CODE BLUE.   INTERVAL HISTORY: [name]. 78 y.o. male returns to the clinic today for followup visit accompanied by his wife. The patient is feeling much better  today. He denied having any significant chest pain, shortness of breath, cough or hemoptysis. He has no significant weight loss or night sweats. The patient denied having any nausea or vomiting. The patient has repeat CT scan of the chest, abdomen and pelvis performed recently and he is here for evaluation and discussion of his scan results.  MEDICAL HISTORY: Past Medical History  Diagnosis Date  . Diabetes mellitus   . HTN (hypertension)   . High cholesterol   . Hx of radiation therapy 09/22/10 - 11/05/10    LLL lung  . History of chemotherapy   . History of radiation therapy 04/24/12-05/15/12    lllung 35Gy/14dx  . Lung cancer 2012    recurrence 12/2011    ALLERGIES:  is allergic to codeine and penicillins.  MEDICATIONS:  Current Outpatient Prescriptions  Medication Sig Dispense Refill  . albuterol (PROVENTIL) (5 MG/ML) 0.5% nebulizer solution Take 0.5 mLs (2.5 mg total) by nebulization every 4 (four) hours as needed for wheezing.  20 mL  3  . atorvastatin (LIPITOR) 20 MG tablet Take 20 mg by mouth daily.      Marland Kitchen glimepiride (AMARYL) 4 MG tablet Take 8 mg by mouth daily.       Marland Kitchen lactose free nutrition (BOOST PLUS) LIQD Take 237 mLs by mouth 2 (two) times daily as needed.      . latanoprost (XALATAN) 0.005 % ophthalmic solution Place 1 drop into both eyes at bedtime.       Marland Kitchen nystatin (MYCOSTATIN) 100000 UNIT/ML suspension Take 10,000 Units by mouth 4 (four) times daily. As needed for thrush      .  pioglitazone (ACTOS) 30 MG tablet Take 30 mg by mouth daily.       . sitaGLIPtin (JANUVIA) 100 MG tablet Take 100 mg by mouth daily.      Marland Kitchen tiotropium (SPIRIVA HANDIHALER) 18 MCG inhalation capsule Place 1 capsule (18 mcg total) into inhaler and inhale daily.  30 capsule  6   No current facility-administered medications for this visit.   Facility-Administered Medications Ordered in Other Visits  Medication Dose Route Frequency Provider Last Rate Last Dose  . sodium chloride 0.9 % injection 10  mL  10 mL Intracatheter PRN Curt Bears, MD   10 mL at 11/24/11 1637    SURGICAL HISTORY:  Past Surgical History  Procedure Laterality Date  . Tonsillectomy    . Gastrostomy tube placement  2011    has been removed  . Portacath placement  08/2010    REVIEW OF SYSTEMS:  A comprehensive review of systems was negative except for: Constitutional: positive for anorexia, fatigue and weight loss Respiratory: positive for dyspnea on exertion Musculoskeletal: positive for muscle weakness   PHYSICAL EXAMINATION: General appearance: alert, cooperative, fatigued and no distress Head: Normocephalic, without obvious abnormality, atraumatic Neck: no adenopathy Lymph nodes: Cervical, supraclavicular, and axillary nodes normal. Resp: clear to auscultation bilaterally Cardio: regular rate and rhythm, S1, S2 normal, no murmur, click, rub or gallop GI: soft, non-tender; bowel sounds normal; no masses,  no organomegaly Extremities: extremities normal, atraumatic, no cyanosis or edema Neurologic: Alert and oriented X 3, normal strength and tone. Normal symmetric reflexes. Normal coordination and gait  ECOG PERFORMANCE STATUS: 1 - Symptomatic but completely ambulatory  Blood pressure 144/71, pulse 110, temperature 97.4 F (36.3 C), temperature source Oral, resp. rate 18, height 5' 10.5" (1.791 m), weight 189 lb 4.8 oz (85.866 kg), SpO2 99.00%.  LABORATORY DATA: Lab Results  Component Value Date   WBC 7.6 04/01/2013   HGB 12.1* 04/01/2013   HCT 36.0* 04/01/2013   MCV 95.2 04/01/2013   PLT 263 04/01/2013      Chemistry      Component Value Date/Time   NA 141 04/01/2013 1117   NA 138 10/17/2012 0615   NA 134 10/19/2011 0817   K 4.8 04/01/2013 1117   K 3.4* 10/17/2012 0615   K 4.5 10/19/2011 0817   CL 104 10/17/2012 0615   CL 102 09/04/2012 1017   CL 99 10/19/2011 0817   CO2 27 04/01/2013 1117   CO2 27 10/17/2012 0615   CO2 27 10/19/2011 0817   BUN 20.5 04/01/2013 1117   BUN 6 10/17/2012 0615   BUN 13 10/19/2011  0817   CREATININE 1.0 04/01/2013 1117   CREATININE 0.82 10/17/2012 0615   CREATININE 1.4* 10/19/2011 0817      Component Value Date/Time   CALCIUM 10.4 04/01/2013 1117   CALCIUM 8.1* 10/17/2012 0615   CALCIUM 9.3 10/19/2011 0817   ALKPHOS 82 04/01/2013 1117   ALKPHOS 77 07/12/2012 0520   ALKPHOS 48 10/19/2011 0817   AST 16 04/01/2013 1117   AST 12 07/12/2012 0520   AST 19 10/19/2011 0817   ALT 16 04/01/2013 1117   ALT 13 07/12/2012 0520   ALT 26 10/19/2011 0817   BILITOT 0.75 04/01/2013 1117   BILITOT 0.6 07/12/2012 0520   BILITOT 0.90 10/19/2011 0817       RADIOGRAPHIC STUDIES: Ct Chest W Contrast  04/01/2013   CLINICAL DATA:  Left lung cancer. Prior radiation and chemotherapy. Restaging.  EXAM: CT CHEST, ABDOMEN, AND PELVIS WITH CONTRAST  TECHNIQUE:  Multidetector CT imaging of the chest, abdomen and pelvis was performed following the standard protocol during bolus administration of intravenous contrast.  CONTRAST:  175mL OMNIPAQUE IOHEXOL 300 MG/ML  SOLN  COMPARISON:  01/10/2013  FINDINGS: CT CHEST FINDINGS  The low-attenuation mass noted centrally in the left lower lobe is again seen and measures approximately 5.8 x 4.6 cm. This appears slightly larger when compared to prior study. This measures 5.4 x 4.3 cm compared with 4.9 x 4.4 cm previously. Small left pleural effusion slightly smaller when compared to prior study. Airspace disease around the central low-density mass likely reflects postradiation changes/scarring.  Moderate to severe underlying COPD. No additional pulmonary nodules or masses. No pleural effusion on the right. Heart is normal size. Aorta is normal caliber. No mediastinal, hilar, or axillary adenopathy. Chest wall soft tissues are unremarkable. Right Port-A-Cath remains in place, unchanged.  CT ABDOMEN AND PELVIS FINDINGS  Calcifications scattered throughout the pancreas compatible with chronic pancreatitis, stable. Liver, gallbladder, spleen, adrenals are unremarkable. Small cyst off the lower  pole of the left kidney is stable. Right kidney unremarkable. No hydronephrosis.  Appendix is visualized and is normal. Stomach, large and small bowel grossly unremarkable. Urinary bladder and prostate grossly unremarkable. No free fluid, free air or adenopathy.  Aorta demonstrates irregular plaque.  No aneurysm.  No acute or focal osseous abnormality within the chest, abdomen or pelvis. Degenerative changes throughout the thoracolumbar spine.  IMPRESSION: Central low-density mass within the left lower lobe measures slightly larger on today's study. There is surrounding airspace disease, likely postradiation, which makes visualization of the margins of the mass somewhat difficult. May consider further evaluation with PET CT.  Small left pleural effusion, slightly decreased.  Moderate to severe COPD changes.  No acute findings or evidence of metastatic disease in the abdomen or pelvis.   Electronically Signed   By: Rolm Baptise M.D.   On: 04/01/2013 14:45   ASSESSMENT AND PLAN: This is a very pleasant 78 years old white male with stage IIIB/4 non-small cell lung cancer, squamous cell carcinoma. His recent CT scan of the chest, abdomen and pelvis showed no significant evidence for disease progression except for mild increase in the left lower lobe mass.  I discussed the scan results with the patient and his wife. I recommended for him to continue on observation. I will see the patient back for followup visit in 3 months with repeat CT scan of the chest, abdomen and pelvis for restaging of his disease. He was advised to call immediately if he has any concerning symptoms in the interval. The patient voices understanding of current disease status and treatment options and is in agreement with the current care plan.  All questions were answered. The patient knows to call the clinic with any problems, questions or concerns. We can certainly see the patient much sooner if necessary.  Disclaimer: This note was  dictated with voice recognition software. Similar sounding words can inadvertently be transcribed and may not be corrected upon review.

## 2013-04-04 NOTE — Telephone Encounter (Signed)
gv adn printed appt sched and avs for pt for Marcha and April

## 2013-04-04 NOTE — Telephone Encounter (Signed)
gv pt barium

## 2013-04-05 NOTE — Patient Instructions (Signed)
Follow up visit in 3 months with repeat CT scan of the chest, abdomen and pelvis

## 2013-04-08 ENCOUNTER — Telehealth (HOSPITAL_COMMUNITY): Payer: Self-pay | Admitting: *Deleted

## 2013-04-15 ENCOUNTER — Other Ambulatory Visit (HOSPITAL_COMMUNITY): Payer: Medicare Other

## 2013-05-16 ENCOUNTER — Ambulatory Visit (HOSPITAL_BASED_OUTPATIENT_CLINIC_OR_DEPARTMENT_OTHER): Payer: Medicare Other

## 2013-05-16 VITALS — BP 110/60 | HR 67 | Temp 97.7°F

## 2013-05-16 DIAGNOSIS — Z452 Encounter for adjustment and management of vascular access device: Secondary | ICD-10-CM

## 2013-05-16 DIAGNOSIS — C3430 Malignant neoplasm of lower lobe, unspecified bronchus or lung: Secondary | ICD-10-CM

## 2013-05-16 DIAGNOSIS — Z95828 Presence of other vascular implants and grafts: Secondary | ICD-10-CM

## 2013-05-16 MED ORDER — SODIUM CHLORIDE 0.9 % IJ SOLN
10.0000 mL | INTRAMUSCULAR | Status: DC | PRN
  Administered 2013-05-16: 10 mL via INTRAVENOUS
  Filled 2013-05-16: qty 10

## 2013-05-16 MED ORDER — HEPARIN SOD (PORK) LOCK FLUSH 100 UNIT/ML IV SOLN
500.0000 [IU] | Freq: Once | INTRAVENOUS | Status: AC
  Administered 2013-05-16: 500 [IU] via INTRAVENOUS
  Filled 2013-05-16: qty 5

## 2013-05-16 NOTE — Patient Instructions (Signed)

## 2013-05-24 ENCOUNTER — Encounter: Payer: Self-pay | Admitting: Critical Care Medicine

## 2013-05-24 ENCOUNTER — Ambulatory Visit (INDEPENDENT_AMBULATORY_CARE_PROVIDER_SITE_OTHER): Payer: Medicare Other | Admitting: Critical Care Medicine

## 2013-05-24 VITALS — BP 104/52 | HR 90 | Temp 97.8°F | Ht 70.5 in | Wt 194.8 lb

## 2013-05-24 DIAGNOSIS — C3490 Malignant neoplasm of unspecified part of unspecified bronchus or lung: Secondary | ICD-10-CM

## 2013-05-24 DIAGNOSIS — J449 Chronic obstructive pulmonary disease, unspecified: Secondary | ICD-10-CM

## 2013-05-24 NOTE — Assessment & Plan Note (Signed)
Progressive lung CA non small cell L lung with chronic resp failure Plan Per ONC MOST form given to the patient for review

## 2013-05-24 NOTE — Progress Notes (Signed)
   Subjective:    Patient ID: Steven Rias., male    DOB: [birth year], 78 y.o.   MRN: [id number]  HPI   05/24/2013 Chief Complaint  Patient presents with  . 3 month follow up    DOE is unchanged.  Occas wheezing.  Cough prod with clear mucus.    Not much change in dyspnea.  Notes some chest discomfort in L chest area.   Pt is off Rx for now.  Pt to go for another scan in 06/2013.   Last Crx 10/2012. No hemoptysis    Review of Systems 11 pt ros neg except as HPI    Objective:   Physical Exam Filed Vitals:   05/24/13 1207  BP: 104/52  Pulse: 90  Temp: 97.8 F (36.6 C)  TempSrc: Oral  Height: 5' 10.5" (1.791 m)  Weight: 194 lb 12.8 oz (88.361 kg)  SpO2: 100%    Gen: Pleasant, well-nourished, in no distress,  normal affect  ENT: No lesions,  mouth clear,  oropharynx clear, no postnasal drip  Neck: No JVD, no TMG, no carotid bruits  Lungs: No use of accessory muscles, no dullness to percussion, distant BS   Cardiovascular: RRR, heart sounds normal, no murmur or gallops, no peripheral edema  Abdomen: soft and NT, no HSM,  BS normal  Musculoskeletal: No deformities, no cyanosis or clubbing  Neuro: alert, non focal  Skin: Warm, no lesions or rashes  No results found.       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 albuterol (PROVENTIL) (5 MG/ML) 0.5% nebulizer solution Take 0.5 mLs (2.5 mg total) by nebulization every 4 (four) hours as needed for wheezing.  Marland Kitchen atorvastatin (LIPITOR)  20 MG tablet Take 20 mg by mouth daily.  Marland Kitchen glimepiride (AMARYL) 4 MG tablet Take 8 mg by mouth daily.   Marland Kitchen lactose free nutrition (BOOST PLUS) LIQD Take 237 mLs by mouth 2 (two) times daily as needed.  . latanoprost (XALATAN) 0.005 % ophthalmic solution Place 1 drop into both eyes at bedtime.   Marland Kitchen nystatin (MYCOSTATIN) 100000 UNIT/ML suspension Take 10,000 Units by mouth 4 (four) times daily. As needed for thrush  . pioglitazone (ACTOS) 30 MG tablet Take 30 mg by mouth daily.   Marland Kitchen tiotropium (SPIRIVA HANDIHALER) 18 MCG inhalation capsule Place 1 capsule (18 mcg total) into inhaler and inhale daily.  . [DISCONTINUED] sitaGLIPtin (JANUVIA) 100 MG tablet Take 100 mg by mouth daily.     w

## 2013-05-24 NOTE — Assessment & Plan Note (Signed)
Gold stage C copd with chronic resp failure Plan Cont oxygen This patient continues to use and benefit from her oxygen therapy and has re qualified at this visit for continued oxygen therapy Cont spiriva

## 2013-05-24 NOTE — Patient Instructions (Signed)
No change in medications. Return in          3 months The MOST form was given to you to review

## 2013-07-01 ENCOUNTER — Telehealth: Payer: Self-pay | Admitting: *Deleted

## 2013-07-01 NOTE — Telephone Encounter (Signed)
Lab work dated 06/25/13 from Dr. Arelia Sneddon office given to Dr Vista Mink to review.  SLJ   hbg 1.5, hbg A1c 6.2, ALT 9

## 2013-07-04 ENCOUNTER — Encounter (HOSPITAL_COMMUNITY): Payer: Self-pay

## 2013-07-04 ENCOUNTER — Ambulatory Visit (HOSPITAL_COMMUNITY)
Admission: RE | Admit: 2013-07-04 | Discharge: 2013-07-04 | Disposition: A | Discharge status: Home / Self Care | Payer: Medicare Other | Source: Ambulatory Visit | Attending: Internal Medicine | Admitting: Internal Medicine

## 2013-07-04 ENCOUNTER — Other Ambulatory Visit (HOSPITAL_BASED_OUTPATIENT_CLINIC_OR_DEPARTMENT_OTHER): Payer: Medicare Other

## 2013-07-04 DIAGNOSIS — R05 Cough: Secondary | ICD-10-CM | POA: Insufficient documentation

## 2013-07-04 DIAGNOSIS — C3490 Malignant neoplasm of unspecified part of unspecified bronchus or lung: Secondary | ICD-10-CM

## 2013-07-04 DIAGNOSIS — Z923 Personal history of irradiation: Secondary | ICD-10-CM | POA: Insufficient documentation

## 2013-07-04 DIAGNOSIS — C3430 Malignant neoplasm of lower lobe, unspecified bronchus or lung: Secondary | ICD-10-CM | POA: Insufficient documentation

## 2013-07-04 DIAGNOSIS — Z9221 Personal history of antineoplastic chemotherapy: Secondary | ICD-10-CM | POA: Insufficient documentation

## 2013-07-04 DIAGNOSIS — R059 Cough, unspecified: Secondary | ICD-10-CM | POA: Insufficient documentation

## 2013-07-04 LAB — COMPREHENSIVE METABOLIC PANEL (CC13)
ALT: 7 U/L (ref 0–55)
AST: 12 U/L (ref 5–34)
Albumin: 3.5 g/dL (ref 3.5–5.0)
Alkaline Phosphatase: 93 U/L (ref 40–150)
Anion Gap: 11 mEq/L (ref 3–11)
BILIRUBIN TOTAL: 0.47 mg/dL (ref 0.20–1.20)
BUN: 31.4 mg/dL — AB (ref 7.0–26.0)
CALCIUM: 10.3 mg/dL (ref 8.4–10.4)
CHLORIDE: 104 meq/L (ref 98–109)
CO2: 27 mEq/L (ref 22–29)
CREATININE: 1.1 mg/dL (ref 0.7–1.3)
Glucose: 182 mg/dl — ABNORMAL HIGH (ref 70–140)
Potassium: 4.2 mEq/L (ref 3.5–5.1)
Sodium: 142 mEq/L (ref 136–145)
Total Protein: 7.9 g/dL (ref 6.4–8.3)

## 2013-07-04 LAB — CBC WITH DIFFERENTIAL/PLATELET
BASO%: 1 % (ref 0.0–2.0)
Basophils Absolute: 0.1 10*3/uL (ref 0.0–0.1)
EOS%: 7 % (ref 0.0–7.0)
Eosinophils Absolute: 0.4 10*3/uL (ref 0.0–0.5)
HEMATOCRIT: 34.4 % — AB (ref 38.4–49.9)
HGB: 11 g/dL — ABNORMAL LOW (ref 13.0–17.1)
LYMPH#: 1.3 10*3/uL (ref 0.9–3.3)
LYMPH%: 21 % (ref 14.0–49.0)
MCH: 29.6 pg (ref 27.2–33.4)
MCHC: 32 g/dL (ref 32.0–36.0)
MCV: 92.5 fL (ref 79.3–98.0)
MONO#: 0.5 10*3/uL (ref 0.1–0.9)
MONO%: 8.1 % (ref 0.0–14.0)
NEUT#: 4 10*3/uL (ref 1.5–6.5)
NEUT%: 62.9 % (ref 39.0–75.0)
Platelets: 210 10*3/uL (ref 140–400)
RBC: 3.72 10*6/uL — ABNORMAL LOW (ref 4.20–5.82)
RDW: 15.4 % — ABNORMAL HIGH (ref 11.0–14.6)
WBC: 6.3 10*3/uL (ref 4.0–10.3)

## 2013-07-04 MED ORDER — IOHEXOL 300 MG/ML  SOLN
100.0000 mL | Freq: Once | INTRAMUSCULAR | Status: AC | PRN
  Administered 2013-07-04: 100 mL via INTRAVENOUS

## 2013-07-11 ENCOUNTER — Encounter: Payer: Self-pay | Admitting: Internal Medicine

## 2013-07-11 ENCOUNTER — Ambulatory Visit (HOSPITAL_BASED_OUTPATIENT_CLINIC_OR_DEPARTMENT_OTHER): Payer: Medicare Other | Admitting: Internal Medicine

## 2013-07-11 ENCOUNTER — Telehealth: Payer: Self-pay | Admitting: Internal Medicine

## 2013-07-11 VITALS — BP 108/61 | HR 70 | Temp 97.4°F | Resp 18 | Ht 70.5 in | Wt 200.6 lb

## 2013-07-11 DIAGNOSIS — C3490 Malignant neoplasm of unspecified part of unspecified bronchus or lung: Secondary | ICD-10-CM

## 2013-07-11 DIAGNOSIS — C3430 Malignant neoplasm of lower lobe, unspecified bronchus or lung: Secondary | ICD-10-CM

## 2013-07-11 NOTE — Telephone Encounter (Signed)
Gave appt for lab and MD for June 2015

## 2013-07-11 NOTE — Progress Notes (Signed)
[facility] [phone]   [phone]  OFFICE PROGRESS NOTE  Leonard Downing, MD [place] [address]  PRINCIPAL DIAGNOSIS AND STAGE: Stage IIIB/IV non-small cell lung cancer, squamous cell carcinoma diagnosed in June 2012.   PRIOR THERAPY:  1) Status post a course of concurrent chemoradiation with weekly carboplatin and paclitaxel. Last dose of chemotherapy was given on November 01, 2010.  2) Systemic chemotherapy with carboplatin for an AUC of 5 given on day 1 and gemcitabine 1000 mg per meter square given on days 1 and 8 every 3 weeks status post 1 cycle. However due to thrombocytopenia from cycle 2 forward he will proceed with carboplatin for an AUC of 5 given on day 1 and gemcitabine at 800 mg per meter squared given on days 1 and 8 every 3 weeks. Status post 6 cycles with evidence for disease progression after cycle #6.  3) Palliative radiotherapy to the enlarging left lower lobe lung mass under the care of Dr. Tammi Klippel.  4) Systemic chemotherapy with docetaxel 75 mg/M2 every 3 weeks with Neulasta support, status post 2 cycles, first cycle was started on 06/12/2012. That was discontinued today secondary to intolerance.  5) Systemic chemotherapy with docetaxel 25 mg/M2 every week, first cycle is expected on 08/08/2012. Status post 8 doses.   CURRENT THERAPY: Observation.   CHEMOTHERAPY INTENT: Palliative  CURRENT # OF CHEMOTHERAPY CYCLES: 0   CURRENT ANTIEMETICS: Zofran, dexamethasone and Compazine  CURRENT SMOKING STATUS: Currently nonsmoker  ORAL CHEMOTHERAPY AND CONSENT: None  CURRENT BISPHOSPHONATES USE: None  PAIN MANAGEMENT: 1/10. Ibuprofen when necessary.  NARCOTICS INDUCED CONSTIPATION: No constipation  LIVING WILL AND CODE STATUS: No CODE BLUE.   INTERVAL HISTORY: [name]. 78 y.o. male returns to the clinic today for followup visit accompanied by his wife. The patient is feeling much  better today. He is now more active and doing a lot around his house. He denied having any significant chest pain, shortness of breath, cough or hemoptysis. He has no significant weight loss or night sweats. The patient denied having any nausea or vomiting. The patient has repeat CT scan of the chest, abdomen and pelvis performed recently and he is here for evaluation and discussion of his scan results.  MEDICAL HISTORY: Past Medical History  Diagnosis Date  . Diabetes mellitus   . HTN (hypertension)   . High cholesterol   . Hx of radiation therapy 09/22/10 - 11/05/10    LLL lung  . History of chemotherapy   . History of radiation therapy 04/24/12-05/15/12    lllung 35Gy/14dx  . Lung cancer 2012    recurrence 12/2011    ALLERGIES:  is allergic to codeine and penicillins.  MEDICATIONS:  Current Outpatient Prescriptions  Medication Sig Dispense Refill  . albuterol (PROVENTIL) (5 MG/ML) 0.5% nebulizer solution Take 0.5 mLs (2.5 mg total) by nebulization every 4 (four) hours as needed for wheezing.  20 mL  3  . atorvastatin (LIPITOR) 20 MG tablet Take 20 mg by mouth daily.      Marland Kitchen glimepiride (AMARYL) 4 MG tablet Take 8 mg by mouth daily.       Marland Kitchen lactose free nutrition (BOOST PLUS) LIQD Take 237 mLs by mouth 2 (two) times daily as needed.      . latanoprost (XALATAN) 0.005 % ophthalmic solution Place 1 drop into both eyes at bedtime.       . pioglitazone (ACTOS) 30 MG tablet Take 30 mg  by mouth daily.       Marland Kitchen tiotropium (SPIRIVA HANDIHALER) 18 MCG inhalation capsule Place 1 capsule (18 mcg total) into inhaler and inhale daily.  30 capsule  6   No current facility-administered medications for this visit.   Facility-Administered Medications Ordered in Other Visits  Medication Dose Route Frequency Provider Last Rate Last Dose  . sodium chloride 0.9 % injection 10 mL  10 mL Intracatheter PRN Curt Bears, MD   10 mL at 11/24/11 1637    SURGICAL HISTORY:  Past Surgical History  Procedure  Laterality Date  . Tonsillectomy    . Gastrostomy tube placement  2011    has been removed  . Portacath placement  08/2010    REVIEW OF SYSTEMS:  A comprehensive review of systems was negative.   PHYSICAL EXAMINATION: General appearance: alert, cooperative, fatigued and no distress Head: Normocephalic, without obvious abnormality, atraumatic Neck: no adenopathy Lymph nodes: Cervical, supraclavicular, and axillary nodes normal. Resp: clear to auscultation bilaterally Cardio: regular rate and rhythm, S1, S2 normal, no murmur, click, rub or gallop GI: soft, non-tender; bowel sounds normal; no masses,  no organomegaly Extremities: extremities normal, atraumatic, no cyanosis or edema Neurologic: Alert and oriented X 3, normal strength and tone. Normal symmetric reflexes. Normal coordination and gait  ECOG PERFORMANCE STATUS: 1 - Symptomatic but completely ambulatory  Blood pressure 108/61, pulse 70, temperature 97.4 F (36.3 C), temperature source Oral, resp. rate 18, height 5' 10.5" (1.791 m), weight 200 lb 9.6 oz (90.992 kg), SpO2 100.00%.  LABORATORY DATA: Lab Results  Component Value Date   WBC 6.3 07/04/2013   HGB 11.0* 07/04/2013   HCT 34.4* 07/04/2013   MCV 92.5 07/04/2013   PLT 210 07/04/2013      Chemistry      Component Value Date/Time   NA 142 07/04/2013 0952   NA 138 10/17/2012 0615   NA 134 10/19/2011 0817   K 4.2 07/04/2013 0952   K 3.4* 10/17/2012 0615   K 4.5 10/19/2011 0817   CL 104 10/17/2012 0615   CL 102 09/04/2012 1017   CL 99 10/19/2011 0817   CO2 27 07/04/2013 0952   CO2 27 10/17/2012 0615   CO2 27 10/19/2011 0817   BUN 31.4* 07/04/2013 0952   BUN 6 10/17/2012 0615   BUN 13 10/19/2011 0817   CREATININE 1.1 07/04/2013 0952   CREATININE 0.82 10/17/2012 0615   CREATININE 1.4* 10/19/2011 0817      Component Value Date/Time   CALCIUM 10.3 07/04/2013 0952   CALCIUM 8.1* 10/17/2012 0615   CALCIUM 9.3 10/19/2011 0817   ALKPHOS 93 07/04/2013 0952   ALKPHOS 77 07/12/2012 0520   ALKPHOS 48  10/19/2011 0817   AST 12 07/04/2013 0952   AST 12 07/12/2012 0520   AST 19 10/19/2011 0817   ALT 7 07/04/2013 0952   ALT 13 07/12/2012 0520   ALT 26 10/19/2011 0817   BILITOT 0.47 07/04/2013 0952   BILITOT 0.6 07/12/2012 0520   BILITOT 0.90 10/19/2011 0817       RADIOGRAPHIC STUDIES: Ct Chest W Contrast  07/04/2013   CLINICAL DATA:  Subsequent treatment strategy for Lung cancer, cough, chemotherapy completed. Radiation therapy completed.  EXAM: CT CHEST, ABDOMEN, AND PELVIS WITH CONTRAST  TECHNIQUE: Multidetector CT imaging of the chest, abdomen and pelvis was performed following the standard protocol during bolus administration of intravenous contrast.  CONTRAST:  160mL OMNIPAQUE IOHEXOL 300 MG/ML  SOLN  COMPARISON:  CT 04/01/2013  FINDINGS:  CT CHEST FINDINGS  Mild interval increase in volume of the left lower lobe mass inferior to the hilum measuring 16 mm x 42 mm x 55 mm compared to 54 mm x 43 mm x 41 mm. No new pulmonary nodules. There is volume loss in the left hemi thorax with a stable pleural fluid.  There is no mediastinal lymphadenopathy of than ill-defined subcarinal lymph node measuring 15 mm compared to 14 mm on prior. No supraclavicular lymphadenopathy. No axillary lymphadenopathy. There is a port in the right chest wall   CT ABDOMEN AND PELVIS FINDINGS  No focal hepatic lesion. The gallbladder, pancreas, spleen, adrenal glands, kidneys are normal. There is a simple fluid density cyst extending from the left kidney.  Stomach, small bowel, appendix, and cecum are normal. There are scattered diverticula of the colon.  Abdominal aorta is normal caliber. No retroperitoneal or periportal lymphadenopathy. No mesenteric or peritoneal metastasis.  No free fluid the pelvis. Prostate gland and bladder normal. No pelvic lymphadenopathy no aggressive osseous lesion.   IMPRESSION: 1. Interval in volume of left lower lobe mass is consistent progression of bronchogenic carcinoma. 2. No evidence of mediastinal  metastasis. 3. Interval enlargement of ill-defined subcarinal lymph node. 4. No evidence of distant metastasis. Patient may benefit from a restaging PET-CT scan.   Electronically Signed   By: Suzy Bouchard M.D.   On: 07/04/2013 14:36   ASSESSMENT AND PLAN: This is a very pleasant 78 years old white male with stage IIIB/4 non-small cell lung cancer, squamous cell carcinoma. His recent CT scan of the chest, abdomen and pelvis showed no significant evidence for disease progression except for another mild increase in the left lower lobe mass.  I discussed the scan results with the patient and his wife.  I given him the option of continuous observation and close monitoring versus consideration of treatment with immunotherapy. The patient is currently asymptomatic and feeling good. He would like to continue observation. I would consider him for treatment with immunotherapy if he continues to have evidence for disease progression. I will see the patient back for followup visit in 2 months with repeat CT scan of the chest, abdomen and pelvis for restaging of his disease. He was advised to call immediately if he has any concerning symptoms in the interval. The patient voices understanding of current disease status and treatment options and is in agreement with the current care plan.  All questions were answered. The patient knows to call the clinic with any problems, questions or concerns. We can certainly see the patient much sooner if necessary.  Disclaimer: This note was dictated with voice recognition software. Similar sounding words can inadvertently be transcribed and may not be corrected upon review.

## 2013-08-07 ENCOUNTER — Other Ambulatory Visit: Payer: Self-pay | Admitting: Dermatology

## 2013-08-23 ENCOUNTER — Ambulatory Visit (INDEPENDENT_AMBULATORY_CARE_PROVIDER_SITE_OTHER): Payer: Medicare Other | Admitting: Critical Care Medicine

## 2013-08-23 ENCOUNTER — Encounter: Payer: Self-pay | Admitting: Critical Care Medicine

## 2013-08-23 VITALS — BP 122/60 | HR 88 | Temp 97.0°F | Ht 70.5 in | Wt 206.6 lb

## 2013-08-23 DIAGNOSIS — Z66 Do not resuscitate: Secondary | ICD-10-CM | POA: Insufficient documentation

## 2013-08-23 DIAGNOSIS — J439 Emphysema, unspecified: Secondary | ICD-10-CM

## 2013-08-23 DIAGNOSIS — J449 Chronic obstructive pulmonary disease, unspecified: Secondary | ICD-10-CM

## 2013-08-23 DIAGNOSIS — J438 Other emphysema: Secondary | ICD-10-CM

## 2013-08-23 NOTE — Assessment & Plan Note (Signed)
Gold stage C. COPD stable at this time Plan Maintain inhaled medications as prescribed

## 2013-08-23 NOTE — Assessment & Plan Note (Signed)
Most form and out of hospital DO NOT RESUSCITATE forms completed for this patient and placed into the patient's records

## 2013-08-23 NOTE — Patient Instructions (Signed)
No change in medications. Return in         4 months The MOST form and DNR form filled out

## 2013-08-23 NOTE — Progress Notes (Signed)
   Subjective:    Patient ID: Steven Rias., male    DOB: 1935/04/19, 78 y.o.   MRN: [id number]  HPI  08/23/2013 Chief Complaint  Patient presents with  . 3 month follow up    Has more dyspnea when in the heat, an occas cough with clear, light yellow, or gray mucus and wheezing at times.  Sharp to aching left shoulder pain radiating to back x 1 month.  Dyspnea worse in the heat.  Pt went to Derm: spot remove.  Pos for Ca and went in for another procedure. Has another spot on the Left neck.   On oxygen and spiriva   Review of Systems  11 pt ros neg except as HPI    Objective:   Physical Exam  Filed Vitals:   08/23/13 1101  BP: 122/60  Pulse: 88  Temp: 97 F (36.1 C)  TempSrc: Oral  Height: 5' 10.5" (1.791 m)  Weight: 206 lb 9.6 oz (93.713 kg)  SpO2: 100%    Gen: Pleasant, well-nourished, in no distress,  normal affect  ENT: No lesions,  mouth clear,  oropharynx clear, no postnasal drip  Neck: No JVD, no TMG, no carotid bruits  Lungs: No use of accessory muscles, no dullness to percussion, distant BS   Cardiovascular: RRR, heart sounds normal, no murmur or gallops, no peripheral edema  Abdomen: soft and NT, no HSM,  BS normal  Musculoskeletal: No deformities, no cyanosis or clubbing  Neuro: alert, non focal  Skin: Warm, no lesions or rashes  No results found.       Assessment & Plan:   DNR (do not resuscitate) Most form and out of hospital DO NOT RESUSCITATE forms completed for this patient and placed into the patient's records  Obstructive chronic bronchitis without exacerbation Gold C Gold stage C. COPD stable at this time Plan Maintain inhaled medications as prescribed    Updated Medication List Outpatient Encounter Prescriptions as of 08/23/2013  Medication Sig  . albuterol (PROVENTIL) (5 MG/ML) 0.5% nebulizer solution Take 0.5 mLs (2.5 mg total) by nebulization every 4 (four) hours as needed for wheezing.  Marland Kitchen atorvastatin (LIPITOR) 20 MG  tablet Take 20 mg by mouth daily.  Marland Kitchen glimepiride (AMARYL) 4 MG tablet Take 8 mg by mouth daily.   Marland Kitchen lactose free nutrition (BOOST PLUS) LIQD Take 237 mLs by mouth 2 (two) times daily as needed.  . latanoprost (XALATAN) 0.005 % ophthalmic solution Place 1 drop into both eyes at bedtime.   . pioglitazone (ACTOS) 30 MG tablet Take 30 mg by mouth daily.   Marland Kitchen tiotropium (SPIRIVA HANDIHALER) 18 MCG inhalation capsule Place 1 capsule (18 mcg total) into inhaler and inhale daily.  . traMADol (ULTRAM) 50 MG tablet as needed.     w

## 2013-09-06 ENCOUNTER — Ambulatory Visit (HOSPITAL_COMMUNITY)
Admission: RE | Admit: 2013-09-06 | Discharge: 2013-09-06 | Disposition: A | Discharge status: Home / Self Care | Payer: Medicare Other | Source: Ambulatory Visit | Attending: Internal Medicine | Admitting: Internal Medicine

## 2013-09-06 ENCOUNTER — Other Ambulatory Visit (HOSPITAL_BASED_OUTPATIENT_CLINIC_OR_DEPARTMENT_OTHER): Payer: Medicare Other

## 2013-09-06 DIAGNOSIS — J438 Other emphysema: Secondary | ICD-10-CM | POA: Insufficient documentation

## 2013-09-06 DIAGNOSIS — J90 Pleural effusion, not elsewhere classified: Secondary | ICD-10-CM | POA: Insufficient documentation

## 2013-09-06 DIAGNOSIS — C3430 Malignant neoplasm of lower lobe, unspecified bronchus or lung: Secondary | ICD-10-CM

## 2013-09-06 DIAGNOSIS — I700 Atherosclerosis of aorta: Secondary | ICD-10-CM | POA: Insufficient documentation

## 2013-09-06 DIAGNOSIS — C3490 Malignant neoplasm of unspecified part of unspecified bronchus or lung: Secondary | ICD-10-CM | POA: Insufficient documentation

## 2013-09-06 DIAGNOSIS — K8689 Other specified diseases of pancreas: Secondary | ICD-10-CM | POA: Insufficient documentation

## 2013-09-06 DIAGNOSIS — R0602 Shortness of breath: Secondary | ICD-10-CM | POA: Insufficient documentation

## 2013-09-06 DIAGNOSIS — Z9221 Personal history of antineoplastic chemotherapy: Secondary | ICD-10-CM | POA: Insufficient documentation

## 2013-09-06 DIAGNOSIS — C3492 Malignant neoplasm of unspecified part of left bronchus or lung: Secondary | ICD-10-CM

## 2013-09-06 DIAGNOSIS — I2510 Atherosclerotic heart disease of native coronary artery without angina pectoris: Secondary | ICD-10-CM | POA: Insufficient documentation

## 2013-09-06 LAB — COMPREHENSIVE METABOLIC PANEL (CC13)
ALT: 14 U/L (ref 0–55)
AST: 11 U/L (ref 5–34)
Albumin: 3.3 g/dL — ABNORMAL LOW (ref 3.5–5.0)
Alkaline Phosphatase: 87 U/L (ref 40–150)
Anion Gap: 12 mEq/L — ABNORMAL HIGH (ref 3–11)
BUN: 20.4 mg/dL (ref 7.0–26.0)
CALCIUM: 10.2 mg/dL (ref 8.4–10.4)
CO2: 27 mEq/L (ref 22–29)
CREATININE: 1 mg/dL (ref 0.7–1.3)
Chloride: 103 mEq/L (ref 98–109)
GLUCOSE: 162 mg/dL — AB (ref 70–140)
POTASSIUM: 4.6 meq/L (ref 3.5–5.1)
Sodium: 141 mEq/L (ref 136–145)
Total Bilirubin: 0.58 mg/dL (ref 0.20–1.20)
Total Protein: 7.9 g/dL (ref 6.4–8.3)

## 2013-09-06 LAB — CBC WITH DIFFERENTIAL/PLATELET
BASO%: 1.1 % (ref 0.0–2.0)
BASOS ABS: 0.1 10*3/uL (ref 0.0–0.1)
EOS ABS: 0.4 10*3/uL (ref 0.0–0.5)
EOS%: 5.2 % (ref 0.0–7.0)
HEMATOCRIT: 31.3 % — AB (ref 38.4–49.9)
HEMOGLOBIN: 10.2 g/dL — AB (ref 13.0–17.1)
LYMPH%: 13.5 % — ABNORMAL LOW (ref 14.0–49.0)
MCH: 29.6 pg (ref 27.2–33.4)
MCHC: 32.5 g/dL (ref 32.0–36.0)
MCV: 91.2 fL (ref 79.3–98.0)
MONO#: 0.6 10*3/uL (ref 0.1–0.9)
MONO%: 7.1 % (ref 0.0–14.0)
NEUT%: 73.1 % (ref 39.0–75.0)
NEUTROS ABS: 6 10*3/uL (ref 1.5–6.5)
Platelets: 279 10*3/uL (ref 140–400)
RBC: 3.43 10*6/uL — ABNORMAL LOW (ref 4.20–5.82)
RDW: 16.6 % — AB (ref 11.0–14.6)
WBC: 8.3 10*3/uL (ref 4.0–10.3)
lymph#: 1.1 10*3/uL (ref 0.9–3.3)

## 2013-09-06 MED ORDER — IOHEXOL 300 MG/ML  SOLN
80.0000 mL | Freq: Once | INTRAMUSCULAR | Status: AC | PRN
  Administered 2013-09-06: 80 mL via INTRAVENOUS

## 2013-09-10 ENCOUNTER — Telehealth: Payer: Self-pay | Admitting: Internal Medicine

## 2013-09-10 ENCOUNTER — Ambulatory Visit (HOSPITAL_COMMUNITY)
Admission: RE | Admit: 2013-09-10 | Discharge: 2013-09-10 | Disposition: A | Discharge status: Home / Self Care | Payer: Medicare Other | Source: Ambulatory Visit | Attending: Radiology | Admitting: Radiology

## 2013-09-10 ENCOUNTER — Ambulatory Visit (HOSPITAL_COMMUNITY)
Admission: RE | Admit: 2013-09-10 | Discharge: 2013-09-10 | Disposition: A | Discharge status: Home / Self Care | Payer: Medicare Other | Source: Ambulatory Visit | Attending: Internal Medicine | Admitting: Internal Medicine

## 2013-09-10 ENCOUNTER — Ambulatory Visit (HOSPITAL_BASED_OUTPATIENT_CLINIC_OR_DEPARTMENT_OTHER): Payer: Medicare Other | Admitting: Internal Medicine

## 2013-09-10 ENCOUNTER — Encounter: Payer: Self-pay | Admitting: Internal Medicine

## 2013-09-10 VITALS — BP 120/55 | HR 116 | Temp 97.9°F | Resp 18 | Ht 70.0 in | Wt 203.7 lb

## 2013-09-10 DIAGNOSIS — C3492 Malignant neoplasm of unspecified part of left bronchus or lung: Secondary | ICD-10-CM

## 2013-09-10 DIAGNOSIS — J90 Pleural effusion, not elsewhere classified: Secondary | ICD-10-CM | POA: Insufficient documentation

## 2013-09-10 DIAGNOSIS — C3430 Malignant neoplasm of lower lobe, unspecified bronchus or lung: Secondary | ICD-10-CM

## 2013-09-10 DIAGNOSIS — C3490 Malignant neoplasm of unspecified part of unspecified bronchus or lung: Secondary | ICD-10-CM | POA: Insufficient documentation

## 2013-09-10 DIAGNOSIS — R599 Enlarged lymph nodes, unspecified: Secondary | ICD-10-CM

## 2013-09-10 MED ORDER — TRAMADOL HCL 50 MG PO TABS: 50.0000 mg | ORAL_TABLET | Freq: Four times a day (QID) | ORAL | Status: DC | PRN

## 2013-09-10 NOTE — Procedures (Signed)
US guided diagnostic/therapeutic left thoracentesis performed yielding 450 cc's yellow fluid. The fluid was sent to the lab for cytology. F/u CXR pending. No immediate complications.

## 2013-09-10 NOTE — Telephone Encounter (Signed)
gv and printed appt sched and avs for pt for July...sent pt to radiology

## 2013-09-10 NOTE — Progress Notes (Signed)
[facility] [phone]   [phone]  OFFICE PROGRESS NOTE  Leonard Downing, MD [place] [address]  PRINCIPAL DIAGNOSIS AND STAGE: Stage IIIB/IV non-small cell lung cancer, squamous cell carcinoma diagnosed in June 2012.   PRIOR THERAPY:  1) Status post a course of concurrent chemoradiation with weekly carboplatin and paclitaxel. Last dose of chemotherapy was given on November 01, 2010.  2) Systemic chemotherapy with carboplatin for an AUC of 5 given on day 1 and gemcitabine 1000 mg per meter square given on days 1 and 8 every 3 weeks status post 1 cycle. However due to thrombocytopenia from cycle 2 forward he will proceed with carboplatin for an AUC of 5 given on day 1 and gemcitabine at 800 mg per meter squared given on days 1 and 8 every 3 weeks. Status post 6 cycles with evidence for disease progression after cycle #6.  3) Palliative radiotherapy to the enlarging left lower lobe lung mass under the care of Dr. Tammi Klippel.  4) Systemic chemotherapy with docetaxel 75 mg/M2 every 3 weeks with Neulasta support, status post 2 cycles, first cycle was started on 06/12/2012. That was discontinued today secondary to intolerance.  5) Systemic chemotherapy with docetaxel 25 mg/M2 every week, first cycle is expected on 08/08/2012. Status post 7 doses. Last dose was given 09/18/2012.  CURRENT THERAPY: Immunotherapy with Nivolumab 3 mg/Kg every 2 weeks. First dose 09/18/2013.  CHEMOTHERAPY INTENT: Palliative  CURRENT # OF CHEMOTHERAPY CYCLES: 1  CURRENT ANTIEMETICS: Zofran, dexamethasone and Compazine  CURRENT SMOKING STATUS: Currently nonsmoker  ORAL CHEMOTHERAPY AND CONSENT: None  CURRENT BISPHOSPHONATES USE: None  PAIN MANAGEMENT: 1/10. Ibuprofen when necessary.  NARCOTICS INDUCED CONSTIPATION: No constipation  LIVING WILL AND CODE STATUS: No CODE BLUE.   INTERVAL HISTORY: [name]. 78 y.o. male returns to the  clinic today for followup visit accompanied by his wife. The patient has been on observation for the last 12 months. He is feeling fine today with no specific complaints except for mild shortness breath with exertion as well as mild fatigue. He gained several pounds since his last visit. He denied having any significant chest pain, cough or hemoptysis. He has no significant weight loss or night sweats. The patient denied having any nausea or vomiting. The patient has repeat CT scan of the chest, abdomen and pelvis performed recently and he is here for evaluation and discussion of his scan results.  MEDICAL HISTORY: Past Medical History  Diagnosis Date  . Diabetes mellitus   . HTN (hypertension)   . High cholesterol   . Hx of radiation therapy 09/22/10 - 11/05/10    LLL lung  . History of chemotherapy   . History of radiation therapy 04/24/12-05/15/12    lllung 35Gy/14dx  . Lung cancer 2012    recurrence 12/2011    ALLERGIES:  is allergic to codeine and penicillins.  MEDICATIONS:  Current Outpatient Prescriptions  Medication Sig Dispense Refill  . acetaminophen (TYLENOL) 500 MG tablet Take 500 mg by mouth every 6 (six) hours as needed.      Marland Kitchen albuterol (PROVENTIL) (5 MG/ML) 0.5% nebulizer solution Take 0.5 mLs (2.5 mg total) by nebulization every 4 (four) hours as needed for wheezing.  20 mL  3  . atorvastatin (LIPITOR) 20 MG tablet Take 20 mg by mouth daily.      Marland Kitchen glimepiride (AMARYL) 4 MG tablet Take 8 mg by mouth daily.       Marland Kitchen  lactose free nutrition (BOOST PLUS) LIQD Take 237 mLs by mouth 2 (two) times daily as needed.      . latanoprost (XALATAN) 0.005 % ophthalmic solution Place 1 drop into both eyes at bedtime.       . pioglitazone (ACTOS) 30 MG tablet Take 30 mg by mouth daily.       Marland Kitchen tiotropium (SPIRIVA HANDIHALER) 18 MCG inhalation capsule Place 1 capsule (18 mcg total) into inhaler and inhale daily.  30 capsule  6  . traMADol (ULTRAM) 50 MG tablet as needed.       No current  facility-administered medications for this visit.   Facility-Administered Medications Ordered in Other Visits  Medication Dose Route Frequency Provider Last Rate Last Dose  . sodium chloride 0.9 % injection 10 mL  10 mL Intracatheter PRN Curt Bears, MD   10 mL at 11/24/11 1637    SURGICAL HISTORY:  Past Surgical History  Procedure Laterality Date  . Tonsillectomy    . Gastrostomy tube placement  2011    has been removed  . Portacath placement  08/2010    REVIEW OF SYSTEMS:  Constitutional: positive for fatigue Eyes: negative Ears, nose, mouth, throat, and face: negative Respiratory: positive for dyspnea on exertion Cardiovascular: negative Gastrointestinal: negative Genitourinary:negative Integument/breast: negative Hematologic/lymphatic: negative Musculoskeletal:negative Neurological: negative Behavioral/Psych: negative Endocrine: negative Allergic/Immunologic: negative   PHYSICAL EXAMINATION: General appearance: alert, cooperative, fatigued and no distress Head: Normocephalic, without obvious abnormality, atraumatic Neck: no adenopathy Lymph nodes: Cervical, supraclavicular, and axillary nodes normal. Resp: clear to auscultation bilaterally Cardio: regular rate and rhythm, S1, S2 normal, no murmur, click, rub or gallop GI: soft, non-tender; bowel sounds normal; no masses,  no organomegaly Extremities: extremities normal, atraumatic, no cyanosis or edema Neurologic: Alert and oriented X 3, normal strength and tone. Normal symmetric reflexes. Normal coordination and gait  ECOG PERFORMANCE STATUS: 1 - Symptomatic but completely ambulatory  Blood pressure 120/55, pulse 116, temperature 97.9 F (36.6 C), temperature source Oral, resp. rate 18, height 5\' 10"  (1.778 m), weight 203 lb 11.2 oz (92.398 kg), SpO2 100.00%.  LABORATORY DATA: Lab Results  Component Value Date   WBC 8.3 09/06/2013   HGB 10.2* 09/06/2013   HCT 31.3* 09/06/2013   MCV 91.2 09/06/2013   PLT 279  09/06/2013      Chemistry      Component Value Date/Time   NA 141 09/06/2013 0944   NA 138 10/17/2012 0615   NA 134 10/19/2011 0817   K 4.6 09/06/2013 0944   K 3.4* 10/17/2012 0615   K 4.5 10/19/2011 0817   CL 104 10/17/2012 0615   CL 102 09/04/2012 1017   CL 99 10/19/2011 0817   CO2 27 09/06/2013 0944   CO2 27 10/17/2012 0615   CO2 27 10/19/2011 0817   BUN 20.4 09/06/2013 0944   BUN 6 10/17/2012 0615   BUN 13 10/19/2011 0817   CREATININE 1.0 09/06/2013 0944   CREATININE 0.82 10/17/2012 0615   CREATININE 1.4* 10/19/2011 0817      Component Value Date/Time   CALCIUM 10.2 09/06/2013 0944   CALCIUM 8.1* 10/17/2012 0615   CALCIUM 9.3 10/19/2011 0817   ALKPHOS 87 09/06/2013 0944   ALKPHOS 77 07/12/2012 0520   ALKPHOS 48 10/19/2011 0817   AST 11 09/06/2013 0944   AST 12 07/12/2012 0520   AST 19 10/19/2011 0817   ALT 14 09/06/2013 0944   ALT 13 07/12/2012 0520   ALT 26 10/19/2011 0817   BILITOT 0.58 09/06/2013 0944  BILITOT 0.6 07/12/2012 0520   BILITOT 0.90 10/19/2011 0817       RADIOGRAPHIC STUDIES: Dg Chest 1 View  09/10/2013   CLINICAL DATA:  Post left thoracentesis, known lung carcinoma  EXAM: CHEST - 1 VIEW  COMPARISON:  CT chest of 09/06/2013  FINDINGS: The recently demonstrated left pleural effusion has largely been evacuated after left thoracentesis. No pneumothorax is seen. The right lung is clear. Right-sided Port-A-Cath remains. Heart size is stable.  IMPRESSION: Reduction in volume of left pleural effusion after left thoracentesis. No pneumothorax.   Electronically Signed   By: Ivar Drape M.D.   On: 09/10/2013 12:42   Ct Chest W Contrast  09/06/2013   CLINICAL DATA:  Chronic shortness of breath. Cough. History of lung cancer diagnosed in 2012 status post chemotherapy now complete. Restaging examination.  EXAM: CT CHEST WITH CONTRAST  TECHNIQUE: Multidetector CT imaging of the chest was performed during intravenous contrast administration.  CONTRAST:  17mL OMNIPAQUE IOHEXOL 300 MG/ML  SOLN  COMPARISON:  Chest CT  07/04/2013.  FINDINGS: Mediastinum: Compared to prior examinations there has been significant growth of a subcarinal nodal mass which currently measures up to 3.3 x 3.0 cm (image 27 of series 2), and is now clearly causing narrowing of the proximal left mainstem bronchus, with marked soft tissue fullness along the posterior aspect of the bronchus, suggesting some direct invasion. This also exerts mass effect upon the adjacent mid esophagus. There are also nonenlarged left paratracheal lymph nodes measuring up to 6 mm in short axis which are conspicuous, but similar to the prior examinations. Heart size is normal. There is no significant pericardial fluid, thickening or pericardial calcification. There is atherosclerosis of the thoracic aorta, the great vessels of the mediastinum and the coronary arteries, including calcified atherosclerotic plaque in the left anterior descending coronary artery. Esophagus is unremarkable in appearance. Right internal jugular single-lumen porta cath with tip terminating at the superior cavoatrial junction.  Lungs/Pleura: Continued interval increase in size of left lower lobe mass which currently measures 6.7 x 4.8 cm (image 37 of series 2). Increasing moderate left pleural effusion which predominantly layers posteriorly. Moderate to severe centrilobular emphysema. No new suspicious pulmonary nodules are noted. Mild diffuse bronchial wall thickening. Mild septal thickening in the periphery of the right lower lobe and right middle lobe, similar to the prior.  Upper Abdomen: Multiple calcifications in the pancreatic head, likely sequela of prior episodes of pancreatitis.  Musculoskeletal: There are no aggressive appearing lytic or blastic lesions noted in the visualized portions of the skeleton.  IMPRESSION: 1. Significant progression of disease compared to the prior study, with continued enlargement of the left lower lobe mass, enlarging moderate left pleural effusion (presumably  malignant), and enlarging subcarinal nodal mass which may be invading the proximal left mainstem bronchus. 2. Additional incidental findings, as above.   Electronically Signed   By: Vinnie Langton M.D.   On: 09/06/2013 12:41   ASSESSMENT AND PLAN: This is a very pleasant 78 years old white male with stage IIIB/4 non-small cell lung cancer, squamous cell carcinoma.  He has been observation for almost a year.  His recent CT scan of the chest showed evidence for disease progression with continued enlargement of the left lower lobe mass as well as enlarging moderate left pleural effusion and enlarging subcarinal lymph node. I discussed the scan results with the patient and his wife.  I discussed with the patient and his wife several options for treatment of his condition including palliative  care versus systemic chemotherapy versus immunotherapy. The patient is interested in proceeding with treatment. I recommended for him regimen consisting of Nivolumab 3 mg/kg every 2 weeks. I discussed with the patient adverse effect of this treatment including but not limited to skin rash, diarrhea, liver, renal and endocrine dysfunction. He would like to proceed with treatment as planned then he gives a verbal consent. He is expected to start the first cycle of this treatment on 09/18/2013. He would come back for followup visit in 3 weeks with the start of cycle #2. He was advised to call immediately if he has any concerning symptoms in the interval. The patient voices understanding of current disease status and treatment options and is in agreement with the current care plan.  All questions were answered. The patient knows to call the clinic with any problems, questions or concerns. We can certainly see the patient much sooner if necessary.  Disclaimer: This note was dictated with voice recognition software. Similar sounding words can inadvertently be transcribed and may not be corrected upon review.

## 2013-09-10 NOTE — Addendum Note (Signed)
Addended by: Curt Bears on: 09/10/2013 04:47 PM   Modules accepted: Orders, SmartSet

## 2013-09-18 ENCOUNTER — Ambulatory Visit (HOSPITAL_BASED_OUTPATIENT_CLINIC_OR_DEPARTMENT_OTHER): Payer: Medicare Other

## 2013-09-18 ENCOUNTER — Encounter: Payer: Self-pay | Admitting: *Deleted

## 2013-09-18 ENCOUNTER — Other Ambulatory Visit (HOSPITAL_BASED_OUTPATIENT_CLINIC_OR_DEPARTMENT_OTHER): Payer: Medicare Other

## 2013-09-18 ENCOUNTER — Other Ambulatory Visit: Payer: Medicare Other

## 2013-09-18 VITALS — BP 115/69 | HR 90 | Temp 97.0°F | Resp 20

## 2013-09-18 DIAGNOSIS — C3432 Malignant neoplasm of lower lobe, left bronchus or lung: Secondary | ICD-10-CM

## 2013-09-18 DIAGNOSIS — R5381 Other malaise: Secondary | ICD-10-CM | POA: Insufficient documentation

## 2013-09-18 DIAGNOSIS — C3430 Malignant neoplasm of lower lobe, unspecified bronchus or lung: Secondary | ICD-10-CM

## 2013-09-18 DIAGNOSIS — R5383 Other fatigue: Secondary | ICD-10-CM

## 2013-09-18 DIAGNOSIS — C3492 Malignant neoplasm of unspecified part of left bronchus or lung: Secondary | ICD-10-CM

## 2013-09-18 DIAGNOSIS — Z5112 Encounter for antineoplastic immunotherapy: Secondary | ICD-10-CM

## 2013-09-18 LAB — CBC WITH DIFFERENTIAL/PLATELET
BASO%: 1.2 % (ref 0.0–2.0)
BASOS ABS: 0.1 10*3/uL (ref 0.0–0.1)
EOS ABS: 0.4 10*3/uL (ref 0.0–0.5)
EOS%: 4.4 % (ref 0.0–7.0)
HEMATOCRIT: 30.8 % — AB (ref 38.4–49.9)
HGB: 10 g/dL — ABNORMAL LOW (ref 13.0–17.1)
LYMPH%: 11.8 % — AB (ref 14.0–49.0)
MCH: 29.3 pg (ref 27.2–33.4)
MCHC: 32.5 g/dL (ref 32.0–36.0)
MCV: 90.1 fL (ref 79.3–98.0)
MONO#: 0.6 10*3/uL (ref 0.1–0.9)
MONO%: 7.6 % (ref 0.0–14.0)
NEUT%: 75 % (ref 39.0–75.0)
NEUTROS ABS: 6.2 10*3/uL (ref 1.5–6.5)
Platelets: 256 10*3/uL (ref 140–400)
RBC: 3.42 10*6/uL — AB (ref 4.20–5.82)
RDW: 16.5 % — ABNORMAL HIGH (ref 11.0–14.6)
WBC: 8.3 10*3/uL (ref 4.0–10.3)
lymph#: 1 10*3/uL (ref 0.9–3.3)

## 2013-09-18 LAB — COMPREHENSIVE METABOLIC PANEL (CC13)
ALT: 15 U/L (ref 0–55)
ANION GAP: 12 meq/L — AB (ref 3–11)
AST: 11 U/L (ref 5–34)
Albumin: 3 g/dL — ABNORMAL LOW (ref 3.5–5.0)
Alkaline Phosphatase: 84 U/L (ref 40–150)
BILIRUBIN TOTAL: 0.57 mg/dL (ref 0.20–1.20)
BUN: 21.2 mg/dL (ref 7.0–26.0)
CHLORIDE: 100 meq/L (ref 98–109)
CO2: 29 meq/L (ref 22–29)
CREATININE: 1.1 mg/dL (ref 0.7–1.3)
Calcium: 10.3 mg/dL (ref 8.4–10.4)
GLUCOSE: 177 mg/dL — AB (ref 70–140)
Potassium: 4.6 mEq/L (ref 3.5–5.1)
Sodium: 140 mEq/L (ref 136–145)
Total Protein: 7.7 g/dL (ref 6.4–8.3)

## 2013-09-18 LAB — TSH CHCC: TSH: 3.714 m(IU)/L (ref 0.320–4.118)

## 2013-09-18 MED ORDER — SODIUM CHLORIDE 0.9 % IJ SOLN
10.0000 mL | INTRAMUSCULAR | Status: DC | PRN
  Administered 2013-09-18: 10 mL
  Filled 2013-09-18: qty 10

## 2013-09-18 MED ORDER — HEPARIN SOD (PORK) LOCK FLUSH 100 UNIT/ML IV SOLN
500.0000 [IU] | Freq: Once | INTRAVENOUS | Status: AC | PRN
  Administered 2013-09-18: 500 [IU]
  Filled 2013-09-18: qty 5

## 2013-09-18 MED ORDER — SODIUM CHLORIDE 0.9 % IV SOLN
Freq: Once | INTRAVENOUS | Status: AC
  Administered 2013-09-18: 15:00:00 via INTRAVENOUS

## 2013-09-18 MED ORDER — SODIUM CHLORIDE 0.9 % IV SOLN
3.0000 mg/kg | Freq: Once | INTRAVENOUS | Status: AC
  Administered 2013-09-18: 280 mg via INTRAVENOUS
  Filled 2013-09-18: qty 28

## 2013-09-18 NOTE — Patient Instructions (Signed)
[facility] Discharge Instructions for Patients Receiving Chemotherapy  Today you received the following chemotherapy agent: Nivolumab   To help prevent nausea and vomiting after your treatment, we encourage you to take your nausea medication as prescribed.    If you develop nausea and vomiting that is not controlled by your nausea medication, call the clinic.   BELOW ARE SYMPTOMS THAT SHOULD BE REPORTED IMMEDIATELY:  *FEVER GREATER THAN 100.5 F  *CHILLS WITH OR WITHOUT FEVER  NAUSEA AND VOMITING THAT IS NOT CONTROLLED WITH YOUR NAUSEA MEDICATION  *UNUSUAL SHORTNESS OF BREATH  *UNUSUAL BRUISING OR BLEEDING  TENDERNESS IN MOUTH AND THROAT WITH OR WITHOUT PRESENCE OF ULCERS  *URINARY PROBLEMS  *BOWEL PROBLEMS  UNUSUAL RASH Items with * indicate a potential emergency and should be followed up as soon as possible.  Feel free to call the clinic you have any questions or concerns. The clinic phone number is [phone].   Nivolumab injection What is this medicine? NIVOLUMAB (nye VOL ue mab) is used to treat certain types of melanoma. This medicine may be used for other purposes; ask your health care provider or pharmacist if you have questions. COMMON BRAND NAME(S): Opdivo What should I tell my health care provider before I take this medicine? They need to know if you have any of these conditions: -eye disease, vision problems -history of pancreatitis -immune system problems -inflammatory bowel disease -kidney disease -liver disease -lung disease -lupus -myasthenia gravis -multiple sclerosis -organ transplant -stomach or intestine problems -thyroid disease -tingling of the fingers or toes, or other nerve disorder -an unusual or allergic reaction to nivolumab, other medicines, foods, dyes, or preservatives -pregnant or trying to get pregnant -breast-feeding How should I use this medicine? This medicine is for infusion into a vein. It is given by a  health care professional in a hospital or clinic setting. A special MedGuide will be given to you before each treatment. Be sure to read this information carefully each time. Talk to your pediatrician regarding the use of this medicine in children. Special care may be needed. Overdosage: If you think you've taken too much of this medicine contact a poison control center or emergency room at once. Overdosage: If you think you have taken too much of this medicine contact a poison control center or emergency room at once. NOTE: This medicine is only for you. Do not share this medicine with others. What if I miss a dose? It is important not to miss your dose. Call your doctor or health care professional if you are unable to keep an appointment. What may interact with this medicine? Interactions have not been studied. This list may not describe all possible interactions. Give your health care provider a list of all the medicines, herbs, non-prescription drugs, or dietary supplements you use. Also tell them if you smoke, drink alcohol, or use illegal drugs. Some items may interact with your medicine. What should I watch for while using this medicine? Tell your doctor or healthcare professional if your symptoms do not start to get better or if they get worse. Your condition will be monitored carefully while you are receiving this medicine. You may need blood work done while you are taking this medicine. What side effects may I notice from receiving this medicine? Side effects that you should report to your doctor or health care professional as soon as possible: -allergic reactions like skin rash, itching or hives, swelling of the face, lips, or tongue -black, tarry stools -bloody or  watery diarrhea -changes in vision -chills -cough -depressed mood -eye pain -feeling anxious -fever -general ill feeling or flu-like symptoms -hair loss -loss of appetite -pain, tingling, numbness in the hands or  feet -redness, blistering, peeling or loosening of the skin, including inside the mouth -red pinpoint spots on skin -signs and symptoms of a dangerous change in heartbeat or heart rhythm like chest pain; dizziness; fast or irregular heartbeat; palpitations; feeling faint or lightheaded, falls; breathing problems -signs and symptoms of high blood sugar such as dizziness; dry mouth; dry skin; fruity breath; nausea; stomach pain; increased hunger or thirst; increased urination -signs and symptoms of kidney injury like trouble passing urine or change in the amount of urine -signs and symptoms of liver injury like dark yellow or brown urine; general ill feeling or flu-like symptoms; light-colored stools; loss of appetite; nausea; right upper belly pain; unusually weak or tired; yellowing of the eyes or skin -swelling of the ankles, feet, hands -weight gain This list may not describe all possible side effects. Call your doctor for medical advice about side effects. You may report side effects to FDA at 1-800-FDA-1088. Where should I keep my medicine? This drug is given in a hospital or clinic and will not be stored at home. NOTE: This sheet is a summary. It may not cover all possible information. If you have questions about this medicine, talk to your doctor, pharmacist, or health care provider.  2015, [employer]. (2013-03-12 17:16:03)

## 2013-09-18 NOTE — Progress Notes (Signed)
Patient tolerated 1st Nivolumab without difficulty

## 2013-09-19 ENCOUNTER — Telehealth: Payer: Self-pay | Admitting: *Deleted

## 2013-09-19 NOTE — Telephone Encounter (Signed)
Spoke with pt for post chemo follow up call.  Pt stated he was doing fine.  Denied nausea/vomiting ; denied pain, has not had BM today yet, but stated he did not have constipation problem; bladder functions fine.  Denied pain.  Stated appetite good and drinking lots of fluids as tolerated.  Pt aware of lab and office visit appts.  Pt understood to call office with any problems.

## 2013-09-25 ENCOUNTER — Other Ambulatory Visit (HOSPITAL_BASED_OUTPATIENT_CLINIC_OR_DEPARTMENT_OTHER): Payer: Medicare Other

## 2013-09-25 DIAGNOSIS — C3492 Malignant neoplasm of unspecified part of left bronchus or lung: Secondary | ICD-10-CM

## 2013-09-25 DIAGNOSIS — C3430 Malignant neoplasm of lower lobe, unspecified bronchus or lung: Secondary | ICD-10-CM

## 2013-09-25 LAB — COMPREHENSIVE METABOLIC PANEL (CC13)
ALBUMIN: 3.1 g/dL — AB (ref 3.5–5.0)
ALT: 10 U/L (ref 0–55)
ANION GAP: 11 meq/L (ref 3–11)
AST: 13 U/L (ref 5–34)
Alkaline Phosphatase: 85 U/L (ref 40–150)
BUN: 16.1 mg/dL (ref 7.0–26.0)
CO2: 28 mEq/L (ref 22–29)
Calcium: 10 mg/dL (ref 8.4–10.4)
Chloride: 100 mEq/L (ref 98–109)
Creatinine: 1 mg/dL (ref 0.7–1.3)
Glucose: 130 mg/dl (ref 70–140)
POTASSIUM: 3.7 meq/L (ref 3.5–5.1)
Sodium: 140 mEq/L (ref 136–145)
Total Bilirubin: 0.58 mg/dL (ref 0.20–1.20)
Total Protein: 7.7 g/dL (ref 6.4–8.3)

## 2013-09-25 LAB — CBC WITH DIFFERENTIAL/PLATELET
BASO%: 1.6 % (ref 0.0–2.0)
BASOS ABS: 0.1 10*3/uL (ref 0.0–0.1)
EOS%: 4.9 % (ref 0.0–7.0)
Eosinophils Absolute: 0.3 10*3/uL (ref 0.0–0.5)
HEMATOCRIT: 29.5 % — AB (ref 38.4–49.9)
HEMOGLOBIN: 9.7 g/dL — AB (ref 13.0–17.1)
LYMPH#: 0.9 10*3/uL (ref 0.9–3.3)
LYMPH%: 13.8 % — ABNORMAL LOW (ref 14.0–49.0)
MCH: 29.2 pg (ref 27.2–33.4)
MCHC: 32.7 g/dL (ref 32.0–36.0)
MCV: 89.1 fL (ref 79.3–98.0)
MONO#: 0.6 10*3/uL (ref 0.1–0.9)
MONO%: 8.8 % (ref 0.0–14.0)
NEUT#: 4.9 10*3/uL (ref 1.5–6.5)
NEUT%: 70.9 % (ref 39.0–75.0)
Platelets: 262 10*3/uL (ref 140–400)
RBC: 3.31 10*6/uL — ABNORMAL LOW (ref 4.20–5.82)
RDW: 17.1 % — ABNORMAL HIGH (ref 11.0–14.6)
WBC: 6.9 10*3/uL (ref 4.0–10.3)

## 2013-10-02 ENCOUNTER — Encounter: Payer: Self-pay | Admitting: Physician Assistant

## 2013-10-02 ENCOUNTER — Ambulatory Visit (HOSPITAL_BASED_OUTPATIENT_CLINIC_OR_DEPARTMENT_OTHER): Payer: Medicare Other | Admitting: Physician Assistant

## 2013-10-02 ENCOUNTER — Telehealth: Payer: Self-pay | Admitting: Internal Medicine

## 2013-10-02 ENCOUNTER — Ambulatory Visit (HOSPITAL_BASED_OUTPATIENT_CLINIC_OR_DEPARTMENT_OTHER): Payer: Medicare Other

## 2013-10-02 ENCOUNTER — Other Ambulatory Visit (HOSPITAL_BASED_OUTPATIENT_CLINIC_OR_DEPARTMENT_OTHER): Payer: Medicare Other

## 2013-10-02 VITALS — BP 119/71 | HR 110 | Temp 97.9°F | Resp 17 | Ht 70.0 in | Wt 203.3 lb

## 2013-10-02 DIAGNOSIS — C3430 Malignant neoplasm of lower lobe, unspecified bronchus or lung: Secondary | ICD-10-CM

## 2013-10-02 DIAGNOSIS — C3412 Malignant neoplasm of upper lobe, left bronchus or lung: Secondary | ICD-10-CM

## 2013-10-02 DIAGNOSIS — C3492 Malignant neoplasm of unspecified part of left bronchus or lung: Secondary | ICD-10-CM

## 2013-10-02 DIAGNOSIS — R599 Enlarged lymph nodes, unspecified: Secondary | ICD-10-CM

## 2013-10-02 DIAGNOSIS — J90 Pleural effusion, not elsewhere classified: Secondary | ICD-10-CM

## 2013-10-02 DIAGNOSIS — C3432 Malignant neoplasm of lower lobe, left bronchus or lung: Secondary | ICD-10-CM

## 2013-10-02 DIAGNOSIS — Z5112 Encounter for antineoplastic immunotherapy: Secondary | ICD-10-CM

## 2013-10-02 LAB — COMPREHENSIVE METABOLIC PANEL (CC13)
ALK PHOS: 78 U/L (ref 40–150)
ALT: 11 U/L (ref 0–55)
AST: 13 U/L (ref 5–34)
Albumin: 3 g/dL — ABNORMAL LOW (ref 3.5–5.0)
Anion Gap: 8 mEq/L (ref 3–11)
BILIRUBIN TOTAL: 0.58 mg/dL (ref 0.20–1.20)
BUN: 16.2 mg/dL (ref 7.0–26.0)
CO2: 31 mEq/L — ABNORMAL HIGH (ref 22–29)
Calcium: 10 mg/dL (ref 8.4–10.4)
Chloride: 100 mEq/L (ref 98–109)
Creatinine: 0.9 mg/dL (ref 0.7–1.3)
Glucose: 144 mg/dl — ABNORMAL HIGH (ref 70–140)
Potassium: 4.1 mEq/L (ref 3.5–5.1)
Sodium: 139 mEq/L (ref 136–145)
TOTAL PROTEIN: 7.5 g/dL (ref 6.4–8.3)

## 2013-10-02 LAB — CBC WITH DIFFERENTIAL/PLATELET
BASO%: 1.3 % (ref 0.0–2.0)
Basophils Absolute: 0.1 10*3/uL (ref 0.0–0.1)
EOS%: 5.5 % (ref 0.0–7.0)
Eosinophils Absolute: 0.4 10*3/uL (ref 0.0–0.5)
HCT: 29.4 % — ABNORMAL LOW (ref 38.4–49.9)
HGB: 9.5 g/dL — ABNORMAL LOW (ref 13.0–17.1)
LYMPH%: 12.1 % — ABNORMAL LOW (ref 14.0–49.0)
MCH: 28.7 pg (ref 27.2–33.4)
MCHC: 32.3 g/dL (ref 32.0–36.0)
MCV: 88.6 fL (ref 79.3–98.0)
MONO#: 0.5 10*3/uL (ref 0.1–0.9)
MONO%: 6.7 % (ref 0.0–14.0)
NEUT#: 5.5 10*3/uL (ref 1.5–6.5)
NEUT%: 74.4 % (ref 39.0–75.0)
Platelets: 257 10*3/uL (ref 140–400)
RBC: 3.32 10*6/uL — AB (ref 4.20–5.82)
RDW: 17.1 % — ABNORMAL HIGH (ref 11.0–14.6)
WBC: 7.4 10*3/uL (ref 4.0–10.3)
lymph#: 0.9 10*3/uL (ref 0.9–3.3)

## 2013-10-02 LAB — MAGNESIUM (CC13): Magnesium: 1.4 mg/dl — CL (ref 1.5–2.5)

## 2013-10-02 MED ORDER — SODIUM CHLORIDE 0.9 % IV SOLN
3.0000 mg/kg | Freq: Once | INTRAVENOUS | Status: AC
  Administered 2013-10-02: 280 mg via INTRAVENOUS
  Filled 2013-10-02: qty 28

## 2013-10-02 MED ORDER — MAGNESIUM OXIDE 400 (241.3 MG) MG PO TABS: 400.0000 mg | ORAL_TABLET | Freq: Every day | ORAL | Status: AC

## 2013-10-02 MED ORDER — PROCHLORPERAZINE MALEATE 10 MG PO TABS: 10.0000 mg | ORAL_TABLET | Freq: Four times a day (QID) | ORAL | Status: DC | PRN

## 2013-10-02 MED ORDER — SODIUM CHLORIDE 0.9 % IJ SOLN
10.0000 mL | INTRAMUSCULAR | Status: DC | PRN
  Administered 2013-10-02: 10 mL
  Filled 2013-10-02: qty 10

## 2013-10-02 MED ORDER — HEPARIN SOD (PORK) LOCK FLUSH 100 UNIT/ML IV SOLN
500.0000 [IU] | Freq: Once | INTRAVENOUS | Status: AC | PRN
  Administered 2013-10-02: 500 [IU]
  Filled 2013-10-02: qty 5

## 2013-10-02 MED ORDER — SODIUM CHLORIDE 0.9 % IV SOLN
Freq: Once | INTRAVENOUS | Status: AC
  Administered 2013-10-02: 13:00:00 via INTRAVENOUS

## 2013-10-02 NOTE — Telephone Encounter (Signed)
gv adn printed appt sched and avs for pt for Aug....sed added tx.

## 2013-10-02 NOTE — Patient Instructions (Signed)
[facility] Discharge Instructions for Patients Receiving Chemotherapy  Today you received the following chemotherapy agents: Nivolumab  To help prevent nausea and vomiting after your treatment, we encourage you to take your nausea medication as prescribed by your physician.   If you develop nausea and vomiting that is not controlled by your nausea medication, call the clinic.   BELOW ARE SYMPTOMS THAT SHOULD BE REPORTED IMMEDIATELY:  *FEVER GREATER THAN 100.5 F  *CHILLS WITH OR WITHOUT FEVER  NAUSEA AND VOMITING THAT IS NOT CONTROLLED WITH YOUR NAUSEA MEDICATION  *UNUSUAL SHORTNESS OF BREATH  *UNUSUAL BRUISING OR BLEEDING  TENDERNESS IN MOUTH AND THROAT WITH OR WITHOUT PRESENCE OF ULCERS  *URINARY PROBLEMS  *BOWEL PROBLEMS  UNUSUAL RASH Items with * indicate a potential emergency and should be followed up as soon as possible.  Feel free to call the clinic you have any questions or concerns. The clinic phone number is [phone].

## 2013-10-02 NOTE — Progress Notes (Addendum)
[facility] [phone]   [phone]  SHARED VISIT PROGRESS NOTE  Leonard Downing, MD [place] [address]  PRINCIPAL DIAGNOSIS AND STAGE: Stage IIIB/IV non-small cell lung cancer, squamous cell carcinoma diagnosed in June 2012.   PRIOR THERAPY:  1) Status post a course of concurrent chemoradiation with weekly carboplatin and paclitaxel. Last dose of chemotherapy was given on November 01, 2010.  2) Systemic chemotherapy with carboplatin for an AUC of 5 given on day 1 and gemcitabine 1000 mg per meter square given on days 1 and 8 every 3 weeks status post 1 cycle. However due to thrombocytopenia from cycle 2 forward he will proceed with carboplatin for an AUC of 5 given on day 1 and gemcitabine at 800 mg per meter squared given on days 1 and 8 every 3 weeks. Status post 6 cycles with evidence for disease progression after cycle #6.  3) Palliative radiotherapy to the enlarging left lower lobe lung mass under the care of Dr. Tammi Klippel.  4) Systemic chemotherapy with docetaxel 75 mg/M2 every 3 weeks with Neulasta support, status post 2 cycles, first cycle was started on 06/12/2012. That was discontinued today secondary to intolerance.  5) Systemic chemotherapy with docetaxel 25 mg/M2 every week, first cycle is expected on 08/08/2012. Status post 7 doses. Last dose was given 09/18/2012.  CURRENT THERAPY: Immunotherapy with Nivolumab 3 mg/Kg every 2 weeks. First dose 09/18/2013. Status post  1 cycle  CHEMOTHERAPY INTENT: Palliative  CURRENT # OF CHEMOTHERAPY CYCLES: 2  CURRENT ANTIEMETICS: Zofran, dexamethasone and Compazine  CURRENT SMOKING STATUS: Currently nonsmoker  ORAL CHEMOTHERAPY AND CONSENT: None  CURRENT BISPHOSPHONATES USE: None  PAIN MANAGEMENT: 1/10. Ibuprofen when necessary.  NARCOTICS INDUCED CONSTIPATION: No constipation  LIVING WILL AND CODE STATUS: No CODE BLUE.   INTERVAL HISTORY: [name].  78 y.o. male returns to the clinic today for followup visit accompanied by his wife. He is currently being treated with immunotherapy in the form of Nivolumab. He is status post 1 cycle. He tolerated the first cycle relatively well with the exception of some fatigue and constipation. He denied skin rash or diarrhea. He does report some occasional episodes of increased shortness of breath. He denied having any significant chest pain, cough or hemoptysis. He has no significant weight loss or night sweats. The patient denied having any nausea or vomiting.   MEDICAL HISTORY: Past Medical History  Diagnosis Date  . Diabetes mellitus   . HTN (hypertension)   . High cholesterol   . Hx of radiation therapy 09/22/10 - 11/05/10    LLL lung  . History of chemotherapy   . History of radiation therapy 04/24/12-05/15/12    lllung 35Gy/14dx  . Lung cancer 2012    recurrence 12/2011    ALLERGIES:  is allergic to codeine and penicillins.  MEDICATIONS:  Current Outpatient Prescriptions  Medication Sig Dispense Refill  . acetaminophen (TYLENOL) 500 MG tablet Take 500 mg by mouth every 6 (six) hours as needed.      Marland Kitchen albuterol (PROVENTIL) (5 MG/ML) 0.5% nebulizer solution Take 0.5 mLs (2.5 mg total) by nebulization every 4 (four) hours as needed for wheezing.  20 mL  3  . atorvastatin (LIPITOR) 20 MG tablet Take 20 mg by mouth daily.      Marland Kitchen glimepiride (AMARYL) 4 MG tablet Take 8 mg by mouth daily.       Marland Kitchen lactose free nutrition (BOOST PLUS) LIQD Take 237 mLs  by mouth 2 (two) times daily as needed.      . latanoprost (XALATAN) 0.005 % ophthalmic solution Place 1 drop into both eyes at bedtime.       . pioglitazone (ACTOS) 30 MG tablet Take 30 mg by mouth daily.       Marland Kitchen tiotropium (SPIRIVA HANDIHALER) 18 MCG inhalation capsule Place 1 capsule (18 mcg total) into inhaler and inhale daily.  30 capsule  6  . traMADol (ULTRAM) 50 MG tablet Take 1 tablet (50 mg total) by mouth every 6 (six) hours as needed.  60  tablet  0  . magnesium oxide (MAG-OX) 400 (241.3 MG) MG tablet Take 1 tablet (400 mg total) by mouth daily.  30 tablet  1  . prochlorperazine (COMPAZINE) 10 MG tablet Take 1 tablet (10 mg total) by mouth every 6 (six) hours as needed for nausea or vomiting.  30 tablet  0   No current facility-administered medications for this visit.   Facility-Administered Medications Ordered in Other Visits  Medication Dose Route Frequency Provider Last Rate Last Dose  . sodium chloride 0.9 % injection 10 mL  10 mL Intracatheter PRN Curt Bears, MD   10 mL at 11/24/11 1637  . sodium chloride 0.9 % injection 10 mL  10 mL Intracatheter PRN Curt Bears, MD   10 mL at 10/02/13 1402    SURGICAL HISTORY:  Past Surgical History  Procedure Laterality Date  . Tonsillectomy    . Gastrostomy tube placement  2011    has been removed  . Portacath placement  08/2010    REVIEW OF SYSTEMS:  Constitutional: positive for fatigue Eyes: negative Ears, nose, mouth, throat, and face: negative Respiratory: positive for dyspnea on exertion Cardiovascular: negative Gastrointestinal: negative Genitourinary:negative Integument/breast: negative Hematologic/lymphatic: negative Musculoskeletal:negative Neurological: negative Behavioral/Psych: negative Endocrine: negative Allergic/Immunologic: negative   PHYSICAL EXAMINATION: General appearance: alert, cooperative, fatigued and no distress Head: Normocephalic, without obvious abnormality, atraumatic Neck: no adenopathy Lymph nodes: Cervical, supraclavicular, and axillary nodes normal. Resp: clear to auscultation bilaterally Cardio: regular rate and rhythm, S1, S2 normal, no murmur, click, rub or gallop GI: soft, non-tender; bowel sounds normal; no masses,  no organomegaly Extremities: extremities normal, atraumatic, no cyanosis or edema Neurologic: Alert and oriented X 3, normal strength and tone. Normal symmetric reflexes. Normal coordination and gait  ECOG  PERFORMANCE STATUS: 1 - Symptomatic but completely ambulatory  Blood pressure 119/71, pulse 110, temperature 97.9 F (36.6 C), temperature source Oral, resp. rate 17, height 5\' 10"  (1.778 m), weight 203 lb 4.8 oz (92.216 kg), SpO2 99.00%.  LABORATORY DATA: Lab Results  Component Value Date   WBC 7.4 10/02/2013   HGB 9.5* 10/02/2013   HCT 29.4* 10/02/2013   MCV 88.6 10/02/2013   PLT 257 10/02/2013      Chemistry      Component Value Date/Time   NA 139 10/02/2013 1050   NA 138 10/17/2012 0615   NA 134 10/19/2011 0817   K 4.1 10/02/2013 1050   K 3.4* 10/17/2012 0615   K 4.5 10/19/2011 0817   CL 104 10/17/2012 0615   CL 102 09/04/2012 1017   CL 99 10/19/2011 0817   CO2 31* 10/02/2013 1050   CO2 27 10/17/2012 0615   CO2 27 10/19/2011 0817   BUN 16.2 10/02/2013 1050   BUN 6 10/17/2012 0615   BUN 13 10/19/2011 0817   CREATININE 0.9 10/02/2013 1050   CREATININE 0.82 10/17/2012 0615   CREATININE 1.4* 10/19/2011 9735  Component Value Date/Time   CALCIUM 10.0 10/02/2013 1050   CALCIUM 8.1* 10/17/2012 0615   CALCIUM 9.3 10/19/2011 0817   ALKPHOS 78 10/02/2013 1050   ALKPHOS 77 07/12/2012 0520   ALKPHOS 48 10/19/2011 0817   AST 13 10/02/2013 1050   AST 12 07/12/2012 0520   AST 19 10/19/2011 0817   ALT 11 10/02/2013 1050   ALT 13 07/12/2012 0520   ALT 26 10/19/2011 0817   BILITOT 0.58 10/02/2013 1050   BILITOT 0.6 07/12/2012 0520   BILITOT 0.90 10/19/2011 0817       RADIOGRAPHIC STUDIES: Dg Chest 1 View  09/10/2013   CLINICAL DATA:  Post left thoracentesis, known lung carcinoma  EXAM: CHEST - 1 VIEW  COMPARISON:  CT chest of 09/06/2013  FINDINGS: The recently demonstrated left pleural effusion has largely been evacuated after left thoracentesis. No pneumothorax is seen. The right lung is clear. Right-sided Port-A-Cath remains. Heart size is stable.  IMPRESSION: Reduction in volume of left pleural effusion after left thoracentesis. No pneumothorax.   Electronically Signed   By: Ivar Drape M.D.   On: 09/10/2013 12:42   Ct  Chest W Contrast  09/06/2013   CLINICAL DATA:  Chronic shortness of breath. Cough. History of lung cancer diagnosed in 2012 status post chemotherapy now complete. Restaging examination.  EXAM: CT CHEST WITH CONTRAST  TECHNIQUE: Multidetector CT imaging of the chest was performed during intravenous contrast administration.  CONTRAST:  70mL OMNIPAQUE IOHEXOL 300 MG/ML  SOLN  COMPARISON:  Chest CT 07/04/2013.  FINDINGS: Mediastinum: Compared to prior examinations there has been significant growth of a subcarinal nodal mass which currently measures up to 3.3 x 3.0 cm (image 27 of series 2), and is now clearly causing narrowing of the proximal left mainstem bronchus, with marked soft tissue fullness along the posterior aspect of the bronchus, suggesting some direct invasion. This also exerts mass effect upon the adjacent mid esophagus. There are also nonenlarged left paratracheal lymph nodes measuring up to 6 mm in short axis which are conspicuous, but similar to the prior examinations. Heart size is normal. There is no significant pericardial fluid, thickening or pericardial calcification. There is atherosclerosis of the thoracic aorta, the great vessels of the mediastinum and the coronary arteries, including calcified atherosclerotic plaque in the left anterior descending coronary artery. Esophagus is unremarkable in appearance. Right internal jugular single-lumen porta cath with tip terminating at the superior cavoatrial junction.  Lungs/Pleura: Continued interval increase in size of left lower lobe mass which currently measures 6.7 x 4.8 cm (image 37 of series 2). Increasing moderate left pleural effusion which predominantly layers posteriorly. Moderate to severe centrilobular emphysema. No new suspicious pulmonary nodules are noted. Mild diffuse bronchial wall thickening. Mild septal thickening in the periphery of the right lower lobe and right middle lobe, similar to the prior.  Upper Abdomen: Multiple  calcifications in the pancreatic head, likely sequela of prior episodes of pancreatitis.  Musculoskeletal: There are no aggressive appearing lytic or blastic lesions noted in the visualized portions of the skeleton.  IMPRESSION: 1. Significant progression of disease compared to the prior study, with continued enlargement of the left lower lobe mass, enlarging moderate left pleural effusion (presumably malignant), and enlarging subcarinal nodal mass which may be invading the proximal left mainstem bronchus. 2. Additional incidental findings, as above.   Electronically Signed   By: Vinnie Langton M.D.   On: 09/06/2013 12:41   ASSESSMENT AND PLAN: This is a very pleasant 78 years old white male  with stage IIIB/4 non-small cell lung cancer, squamous cell carcinoma.  He has been observation for almost a year.  His recent CT scan of the chest showed evidence for disease progression with continued enlargement of the left lower lobe mass as well as enlarging moderate left pleural effusion and enlarging subcarinal lymph node. The patient is currently being treated with Nivolumab 3 mg/kg every 2 weeks. The patient was discussed with and also seen by Dr. Julien Nordmann. He will proceed with cycle #2 of immunotherapy with nivolumab. He will return in 2 weeks prior to cycle #3.  We will monitor his breathing closely on subsequent visits. His magnesium level is slightly low. A prescription for Magnesium oxide 400 mg by mouth daily was sent to his pharmacy of record via [facility] as well a prescription for compazine.  He was advised to call immediately if he has any concerning symptoms in the interval. The patient voices understanding of current disease status and treatment options and is in agreement with the current care plan.  All questions were answered. The patient knows to call the clinic with any problems, questions or concerns. We can certainly see the patient much sooner if necessary.  Carlton Adam,  PA-C  ADDENDUM: Hematology/oncology Attending: I had a face to face encounter with the patient. I recommended his care plan. This is a very pleasant 78 years old white male with metastatic non-small cell lung cancer, squamous cell carcinoma with evidence for disease progression recently. The patient was started on treatment with immunotherapy with Nivolumab status post 1 cycle. He tolerated the first cycle of his treatment fairly well with no significant adverse effects except for mild fatigue. He continues to have shortness of breath secondary to his recent disease progression. I recommended for the patient to proceed with cycle #2 today as scheduled. He would come back for followup visit in 2 weeks with the start of cycle #3. He was advised to call immediately if he has any concerning symptoms in the interval.  Disclaimer: This note was dictated with voice recognition software. Similar sounding words can inadvertently be transcribed and may not be corrected upon review. Eilleen Kempf., MD 10/08/2013

## 2013-10-06 NOTE — Patient Instructions (Signed)
Take magnesium oxide 400 mg by mouth daily Follow up in 2 weeks

## 2013-10-09 ENCOUNTER — Other Ambulatory Visit: Payer: Medicare Other

## 2013-10-16 ENCOUNTER — Encounter: Payer: Self-pay | Admitting: Physician Assistant

## 2013-10-16 ENCOUNTER — Telehealth: Payer: Self-pay | Admitting: *Deleted

## 2013-10-16 ENCOUNTER — Telehealth: Payer: Self-pay | Admitting: Physician Assistant

## 2013-10-16 ENCOUNTER — Ambulatory Visit (HOSPITAL_BASED_OUTPATIENT_CLINIC_OR_DEPARTMENT_OTHER): Payer: Medicare Other | Admitting: Physician Assistant

## 2013-10-16 ENCOUNTER — Ambulatory Visit: Payer: Medicare Other

## 2013-10-16 ENCOUNTER — Ambulatory Visit (HOSPITAL_BASED_OUTPATIENT_CLINIC_OR_DEPARTMENT_OTHER): Payer: Medicare Other

## 2013-10-16 ENCOUNTER — Other Ambulatory Visit (HOSPITAL_BASED_OUTPATIENT_CLINIC_OR_DEPARTMENT_OTHER): Payer: Medicare Other

## 2013-10-16 VITALS — BP 119/59 | HR 104 | Temp 97.6°F | Resp 18 | Ht 70.0 in | Wt 202.0 lb

## 2013-10-16 DIAGNOSIS — Z5112 Encounter for antineoplastic immunotherapy: Secondary | ICD-10-CM

## 2013-10-16 DIAGNOSIS — C3432 Malignant neoplasm of lower lobe, left bronchus or lung: Secondary | ICD-10-CM

## 2013-10-16 DIAGNOSIS — C3430 Malignant neoplasm of lower lobe, unspecified bronchus or lung: Secondary | ICD-10-CM

## 2013-10-16 DIAGNOSIS — Z79899 Other long term (current) drug therapy: Secondary | ICD-10-CM

## 2013-10-16 DIAGNOSIS — C3492 Malignant neoplasm of unspecified part of left bronchus or lung: Secondary | ICD-10-CM

## 2013-10-16 DIAGNOSIS — Z95828 Presence of other vascular implants and grafts: Secondary | ICD-10-CM

## 2013-10-16 DIAGNOSIS — J90 Pleural effusion, not elsewhere classified: Secondary | ICD-10-CM

## 2013-10-16 DIAGNOSIS — R5381 Other malaise: Secondary | ICD-10-CM

## 2013-10-16 DIAGNOSIS — R599 Enlarged lymph nodes, unspecified: Secondary | ICD-10-CM

## 2013-10-16 DIAGNOSIS — R5383 Other fatigue: Secondary | ICD-10-CM

## 2013-10-16 DIAGNOSIS — C3490 Malignant neoplasm of unspecified part of unspecified bronchus or lung: Secondary | ICD-10-CM

## 2013-10-16 DIAGNOSIS — R0609 Other forms of dyspnea: Secondary | ICD-10-CM

## 2013-10-16 DIAGNOSIS — R0989 Other specified symptoms and signs involving the circulatory and respiratory systems: Secondary | ICD-10-CM

## 2013-10-16 LAB — CBC WITH DIFFERENTIAL/PLATELET
BASO%: 0.9 % (ref 0.0–2.0)
Basophils Absolute: 0.1 10*3/uL (ref 0.0–0.1)
EOS%: 4.1 % (ref 0.0–7.0)
Eosinophils Absolute: 0.4 10*3/uL (ref 0.0–0.5)
HEMATOCRIT: 29.1 % — AB (ref 38.4–49.9)
HGB: 9.5 g/dL — ABNORMAL LOW (ref 13.0–17.1)
LYMPH%: 10.7 % — AB (ref 14.0–49.0)
MCH: 29.1 pg (ref 27.2–33.4)
MCHC: 32.6 g/dL (ref 32.0–36.0)
MCV: 89.2 fL (ref 79.3–98.0)
MONO#: 0.5 10*3/uL (ref 0.1–0.9)
MONO%: 6.3 % (ref 0.0–14.0)
NEUT#: 6.7 10*3/uL — ABNORMAL HIGH (ref 1.5–6.5)
NEUT%: 78 % — AB (ref 39.0–75.0)
PLATELETS: 223 10*3/uL (ref 140–400)
RBC: 3.27 10*6/uL — ABNORMAL LOW (ref 4.20–5.82)
RDW: 17.8 % — ABNORMAL HIGH (ref 11.0–14.6)
WBC: 8.6 10*3/uL (ref 4.0–10.3)
lymph#: 0.9 10*3/uL (ref 0.9–3.3)

## 2013-10-16 LAB — COMPREHENSIVE METABOLIC PANEL (CC13)
ALT: 14 U/L (ref 0–55)
ANION GAP: 9 meq/L (ref 3–11)
AST: 16 U/L (ref 5–34)
Albumin: 3.2 g/dL — ABNORMAL LOW (ref 3.5–5.0)
Alkaline Phosphatase: 75 U/L (ref 40–150)
BUN: 18.5 mg/dL (ref 7.0–26.0)
CALCIUM: 9.7 mg/dL (ref 8.4–10.4)
CHLORIDE: 101 meq/L (ref 98–109)
CO2: 27 meq/L (ref 22–29)
CREATININE: 1 mg/dL (ref 0.7–1.3)
Glucose: 202 mg/dl — ABNORMAL HIGH (ref 70–140)
Potassium: 4.3 mEq/L (ref 3.5–5.1)
Sodium: 137 mEq/L (ref 136–145)
Total Bilirubin: 0.58 mg/dL (ref 0.20–1.20)
Total Protein: 7.5 g/dL (ref 6.4–8.3)

## 2013-10-16 LAB — TSH CHCC: TSH: 2.092 m(IU)/L (ref 0.320–4.118)

## 2013-10-16 MED ORDER — SODIUM CHLORIDE 0.9 % IJ SOLN
10.0000 mL | INTRAMUSCULAR | Status: DC | PRN
  Administered 2013-10-16: 10 mL via INTRAVENOUS
  Filled 2013-10-16: qty 10

## 2013-10-16 MED ORDER — SODIUM CHLORIDE 0.9 % IJ SOLN
10.0000 mL | INTRAMUSCULAR | Status: DC | PRN
  Administered 2013-10-16: 10 mL
  Filled 2013-10-16: qty 10

## 2013-10-16 MED ORDER — HEPARIN SOD (PORK) LOCK FLUSH 100 UNIT/ML IV SOLN
500.0000 [IU] | Freq: Once | INTRAVENOUS | Status: AC | PRN
  Administered 2013-10-16: 500 [IU]
  Filled 2013-10-16: qty 5

## 2013-10-16 MED ORDER — SODIUM CHLORIDE 0.9 % IV SOLN
Freq: Once | INTRAVENOUS | Status: AC
  Administered 2013-10-16: 15:00:00 via INTRAVENOUS

## 2013-10-16 MED ORDER — SODIUM CHLORIDE 0.9 % IV SOLN
3.0000 mg/kg | Freq: Once | INTRAVENOUS | Status: AC
  Administered 2013-10-16: 280 mg via INTRAVENOUS
  Filled 2013-10-16: qty 28

## 2013-10-16 NOTE — Patient Instructions (Signed)
[facility] Discharge Instructions for Patients Receiving Chemotherapy  Today you received the following chemotherapy agents Nivolumb.  To help prevent nausea and vomiting after your treatment, we encourage you to take your nausea medication as prescribed.    If you develop nausea and vomiting that is not controlled by your nausea medication, call the clinic.   BELOW ARE SYMPTOMS THAT SHOULD BE REPORTED IMMEDIATELY:  *FEVER GREATER THAN 100.5 F  *CHILLS WITH OR WITHOUT FEVER  NAUSEA AND VOMITING THAT IS NOT CONTROLLED WITH YOUR NAUSEA MEDICATION  *UNUSUAL SHORTNESS OF BREATH  *UNUSUAL BRUISING OR BLEEDING  TENDERNESS IN MOUTH AND THROAT WITH OR WITHOUT PRESENCE OF ULCERS  *URINARY PROBLEMS  *BOWEL PROBLEMS  UNUSUAL RASH Items with * indicate a potential emergency and should be followed up as soon as possible.  Feel free to call the clinic you have any questions or concerns. The clinic phone number is [phone].

## 2013-10-16 NOTE — Telephone Encounter (Signed)
Pt confirmed labs/ov per 08/05 [facility], gave pt AVS....KJ °

## 2013-10-16 NOTE — Patient Instructions (Signed)

## 2013-10-16 NOTE — Telephone Encounter (Signed)
Per [facility] staff message scheduled appts. Advised scheduler 

## 2013-10-16 NOTE — Progress Notes (Addendum)
[facility] [phone]   [phone]  SHARED VISIT PROGRESS NOTE  Steven Downing, MD [place] [address]  PRINCIPAL DIAGNOSIS AND STAGE: Stage IIIB/IV non-small cell lung cancer, squamous cell carcinoma diagnosed in June 2012.   PRIOR THERAPY:  1) Status post a course of concurrent chemoradiation with weekly carboplatin and paclitaxel. Last dose of chemotherapy was given on November 01, 2010.  2) Systemic chemotherapy with carboplatin for an AUC of 5 given on day 1 and gemcitabine 1000 mg per meter square given on days 1 and 8 every 3 weeks status post 1 cycle. However due to thrombocytopenia from cycle 2 forward he will proceed with carboplatin for an AUC of 5 given on day 1 and gemcitabine at 800 mg per meter squared given on days 1 and 8 every 3 weeks. Status post 6 cycles with evidence for disease progression after cycle #6.  3) Palliative radiotherapy to the enlarging left lower lobe lung mass under the care of Dr. Tammi Klippel.  4) Systemic chemotherapy with docetaxel 75 mg/M2 every 3 weeks with Neulasta support, status post 2 cycles, first cycle was started on 06/12/2012. That was discontinued today secondary to intolerance.  5) Systemic chemotherapy with docetaxel 25 mg/M2 every week, first cycle is expected on 08/08/2012. Status post 7 doses. Last dose was given 09/18/2012.  CURRENT THERAPY: Immunotherapy with Nivolumab 3 mg/Kg every 2 weeks. First dose 09/18/2013. Status post  2 cycles  CHEMOTHERAPY INTENT: Palliative  CURRENT # OF CHEMOTHERAPY CYCLES: 3  CURRENT ANTIEMETICS: Zofran, dexamethasone and Compazine  CURRENT SMOKING STATUS: Currently nonsmoker  ORAL CHEMOTHERAPY AND CONSENT: None  CURRENT BISPHOSPHONATES USE: None  PAIN MANAGEMENT: 1/10. Ibuprofen when necessary.  NARCOTICS INDUCED CONSTIPATION: No constipation  LIVING WILL AND CODE STATUS: No CODE BLUE.   INTERVAL HISTORY: [name].  78 y.o. male returns to the clinic today for followup visit accompanied by his wife. He is currently being treated with immunotherapy in the form of Nivolumab. He is status post 2 cycles. Overall is tolerating the immunotherapy without difficulty. He continues to have some fatigue. He has had no change in his baseline shortness of breath. He denied any skin rash or diarrhea. He's had no further issues with constipation. He denied having any significant chest pain, cough or hemoptysis. He has no significant weight loss or night sweats. The patient denied having any nausea or vomiting.   MEDICAL HISTORY: Past Medical History  Diagnosis Date  . Diabetes mellitus   . HTN (hypertension)   . High cholesterol   . Hx of radiation therapy 09/22/10 - 11/05/10    LLL lung  . History of chemotherapy   . History of radiation therapy 04/24/12-05/15/12    lllung 35Gy/14dx  . Lung cancer 2012    recurrence 12/2011    ALLERGIES:  is allergic to codeine and penicillins.  MEDICATIONS:  Current Outpatient Prescriptions  Medication Sig Dispense Refill  . acetaminophen (TYLENOL) 500 MG tablet Take 500 mg by mouth every 6 (six) hours as needed.      Marland Kitchen albuterol (PROVENTIL) (5 MG/ML) 0.5% nebulizer solution Take 0.5 mLs (2.5 mg total) by nebulization every 4 (four) hours as needed for wheezing.  20 mL  3  . atorvastatin (LIPITOR) 20 MG tablet Take 20 mg by mouth daily.      Marland Kitchen glimepiride (AMARYL) 4 MG tablet Take 8 mg by mouth daily.       Marland Kitchen lactose free nutrition (  BOOST PLUS) LIQD Take 237 mLs by mouth 2 (two) times daily as needed.      . latanoprost (XALATAN) 0.005 % ophthalmic solution Place 1 drop into both eyes at bedtime.       . magnesium oxide (MAG-OX) 400 (241.3 MG) MG tablet Take 1 tablet (400 mg total) by mouth daily.  30 tablet  1  . pioglitazone (ACTOS) 30 MG tablet Take 30 mg by mouth daily.       . prochlorperazine (COMPAZINE) 10 MG tablet Take 1 tablet (10 mg total) by mouth every 6 (six) hours as  needed for nausea or vomiting.  30 tablet  0  . tiotropium (SPIRIVA HANDIHALER) 18 MCG inhalation capsule Place 1 capsule (18 mcg total) into inhaler and inhale daily.  30 capsule  6  . traMADol (ULTRAM) 50 MG tablet Take 1 tablet (50 mg total) by mouth every 6 (six) hours as needed.  60 tablet  0   No current facility-administered medications for this visit.   Facility-Administered Medications Ordered in Other Visits  Medication Dose Route Frequency Provider Last Rate Last Dose  . heparin lock flush 100 unit/mL  500 Units Intracatheter Once PRN Curt Bears, MD      . nivolumab (OPDIVO) 280 mg in sodium chloride 0.9 % 100 mL chemo infusion  3 mg/kg (Treatment Plan Actual) Intravenous Once Curt Bears, MD 128 mL/hr at 10/16/13 1532 280 mg at 10/16/13 1532  . sodium chloride 0.9 % injection 10 mL  10 mL Intracatheter PRN Curt Bears, MD   10 mL at 11/24/11 1637  . sodium chloride 0.9 % injection 10 mL  10 mL Intracatheter PRN Curt Bears, MD        SURGICAL HISTORY:  Past Surgical History  Procedure Laterality Date  . Tonsillectomy    . Gastrostomy tube placement  2011    has been removed  . Portacath placement  08/2010    REVIEW OF SYSTEMS:  Constitutional: positive for fatigue Eyes: negative Ears, nose, mouth, throat, and face: negative Respiratory: positive for dyspnea on exertion Cardiovascular: negative Gastrointestinal: negative Genitourinary:negative Integument/breast: negative Hematologic/lymphatic: negative Musculoskeletal:negative Neurological: negative Behavioral/Psych: negative Endocrine: negative Allergic/Immunologic: negative   PHYSICAL EXAMINATION: General appearance: alert, cooperative, fatigued and no distress Head: Normocephalic, without obvious abnormality, atraumatic Neck: no adenopathy Lymph nodes: Cervical, supraclavicular, and axillary nodes normal. Resp: clear to auscultation bilaterally Cardio: regular rate and rhythm, S1, S2 normal, no  murmur, click, rub or gallop GI: soft, non-tender; bowel sounds normal; no masses,  no organomegaly Extremities: extremities normal, atraumatic, no cyanosis or edema Neurologic: Alert and oriented X 3, normal strength and tone. Normal symmetric reflexes. Normal coordination and gait  ECOG PERFORMANCE STATUS: 1 - Symptomatic but completely ambulatory  Blood pressure 119/59, pulse 104, temperature 97.6 F (36.4 C), temperature source Oral, resp. rate 18, height 5\' 10"  (1.778 m), weight 202 lb (91.627 kg), SpO2 100.00%.  LABORATORY DATA: Lab Results  Component Value Date   WBC 8.6 10/16/2013   HGB 9.5* 10/16/2013   HCT 29.1* 10/16/2013   MCV 89.2 10/16/2013   PLT 223 10/16/2013      Chemistry      Component Value Date/Time   NA 137 10/16/2013 1332   NA 138 10/17/2012 0615   NA 134 10/19/2011 0817   K 4.3 10/16/2013 1332   K 3.4* 10/17/2012 0615   K 4.5 10/19/2011 0817   CL 104 10/17/2012 0615   CL 102 09/04/2012 1017   CL 99 10/19/2011 0817  CO2 27 10/16/2013 1332   CO2 27 10/17/2012 0615   CO2 27 10/19/2011 0817   BUN 18.5 10/16/2013 1332   BUN 6 10/17/2012 0615   BUN 13 10/19/2011 0817   CREATININE 1.0 10/16/2013 1332   CREATININE 0.82 10/17/2012 0615   CREATININE 1.4* 10/19/2011 0817      Component Value Date/Time   CALCIUM 9.7 10/16/2013 1332   CALCIUM 8.1* 10/17/2012 0615   CALCIUM 9.3 10/19/2011 0817   ALKPHOS 75 10/16/2013 1332   ALKPHOS 77 07/12/2012 0520   ALKPHOS 48 10/19/2011 0817   AST 16 10/16/2013 1332   AST 12 07/12/2012 0520   AST 19 10/19/2011 0817   ALT 14 10/16/2013 1332   ALT 13 07/12/2012 0520   ALT 26 10/19/2011 0817   BILITOT 0.58 10/16/2013 1332   BILITOT 0.6 07/12/2012 0520   BILITOT 0.90 10/19/2011 0817       RADIOGRAPHIC STUDIES: Dg Chest 1 View  09/10/2013   CLINICAL DATA:  Post left thoracentesis, known lung carcinoma  EXAM: CHEST - 1 VIEW  COMPARISON:  CT chest of 09/06/2013  FINDINGS: The recently demonstrated left pleural effusion has largely been evacuated after left thoracentesis. No  pneumothorax is seen. The right lung is clear. Right-sided Port-A-Cath remains. Heart size is stable.  IMPRESSION: Reduction in volume of left pleural effusion after left thoracentesis. No pneumothorax.   Electronically Signed   By: Ivar Drape M.D.   On: 09/10/2013 12:42   Ct Chest W Contrast  09/06/2013   CLINICAL DATA:  Chronic shortness of breath. Cough. History of lung cancer diagnosed in 2012 status post chemotherapy now complete. Restaging examination.  EXAM: CT CHEST WITH CONTRAST  TECHNIQUE: Multidetector CT imaging of the chest was performed during intravenous contrast administration.  CONTRAST:  76mL OMNIPAQUE IOHEXOL 300 MG/ML  SOLN  COMPARISON:  Chest CT 07/04/2013.  FINDINGS: Mediastinum: Compared to prior examinations there has been significant growth of a subcarinal nodal mass which currently measures up to 3.3 x 3.0 cm (image 27 of series 2), and is now clearly causing narrowing of the proximal left mainstem bronchus, with marked soft tissue fullness along the posterior aspect of the bronchus, suggesting some direct invasion. This also exerts mass effect upon the adjacent mid esophagus. There are also nonenlarged left paratracheal lymph nodes measuring up to 6 mm in short axis which are conspicuous, but similar to the prior examinations. Heart size is normal. There is no significant pericardial fluid, thickening or pericardial calcification. There is atherosclerosis of the thoracic aorta, the great vessels of the mediastinum and the coronary arteries, including calcified atherosclerotic plaque in the left anterior descending coronary artery. Esophagus is unremarkable in appearance. Right internal jugular single-lumen porta cath with tip terminating at the superior cavoatrial junction.  Lungs/Pleura: Continued interval increase in size of left lower lobe mass which currently measures 6.7 x 4.8 cm (image 37 of series 2). Increasing moderate left pleural effusion which predominantly layers  posteriorly. Moderate to severe centrilobular emphysema. No new suspicious pulmonary nodules are noted. Mild diffuse bronchial wall thickening. Mild septal thickening in the periphery of the right lower lobe and right middle lobe, similar to the prior.  Upper Abdomen: Multiple calcifications in the pancreatic head, likely sequela of prior episodes of pancreatitis.  Musculoskeletal: There are no aggressive appearing lytic or blastic lesions noted in the visualized portions of the skeleton.  IMPRESSION: 1. Significant progression of disease compared to the prior study, with continued enlargement of the left lower lobe mass, enlarging  moderate left pleural effusion (presumably malignant), and enlarging subcarinal nodal mass which may be invading the proximal left mainstem bronchus. 2. Additional incidental findings, as above.   Electronically Signed   By: Vinnie Langton M.D.   On: 09/06/2013 12:41   ASSESSMENT AND PLAN: This is a very pleasant 78 years old white male with stage IIIB/4 non-small cell lung cancer, squamous cell carcinoma.  He has been observation for almost a year.  His recent CT scan of the chest showed evidence for disease progression with continued enlargement of the left lower lobe mass as well as enlarging moderate left pleural effusion and enlarging subcarinal lymph node. The patient is currently being treated with Nivolumab 3 mg/kg every 2 weeks. The patient was discussed with and also seen by Dr. Julien Nordmann. He will proceed with cycle #3 of immunotherapy with nivolumab. He will return in 2 weeks prior to cycle #4.  We will monitor his breathing closely on subsequent visits. We will recheck his magnesium level when he returns in 2 weeks.   He was advised to call immediately if he has any concerning symptoms in the interval. The patient voices understanding of current disease status and treatment options and is in agreement with the current care plan.  All questions were answered. The  patient knows to call the clinic with any problems, questions or concerns. We can certainly see the patient much sooner if necessary.  Disclaimer: This note was dictated with voice recognition software. Similar sounding words can inadvertently be transcribed and may not be corrected upon review. Carlton Adam, PA-C 10/16/2013  ADDENDUM: Hematology/Oncology Attending: I had a face to face encounter with the patient. I recommended his care plan. This is a very pleasant 78 years old white male with recurrent non-small cell lung cancer, squamous cell carcinoma. He is currently undergoing immunotherapy with Nivolumab status post 2 cycles. The patient related the first 2 cycles of his treatment fairly well with no significant adverse effects except for mild fatigue. We'll proceed with cycle #3 today as scheduled. He would come back for follow up visit in 2 weeks with the start of cycle #4. He was advised to call immediately if he has any concerning symptoms in the interval.  Disclaimer: This note was dictated with voice recognition software. Similar sounding words can inadvertently be transcribed and may be missed upon review. Eilleen Kempf., MD 10/25/2013

## 2013-10-19 NOTE — Patient Instructions (Signed)
Follow-up in 2 weeks

## 2013-10-30 ENCOUNTER — Other Ambulatory Visit (HOSPITAL_BASED_OUTPATIENT_CLINIC_OR_DEPARTMENT_OTHER): Payer: Medicare Other

## 2013-10-30 ENCOUNTER — Ambulatory Visit: Payer: Medicare Other

## 2013-10-30 ENCOUNTER — Ambulatory Visit (HOSPITAL_BASED_OUTPATIENT_CLINIC_OR_DEPARTMENT_OTHER): Payer: Medicare Other | Admitting: Physician Assistant

## 2013-10-30 ENCOUNTER — Encounter: Payer: Self-pay | Admitting: Physician Assistant

## 2013-10-30 ENCOUNTER — Telehealth: Payer: Self-pay | Admitting: Internal Medicine

## 2013-10-30 ENCOUNTER — Ambulatory Visit (HOSPITAL_BASED_OUTPATIENT_CLINIC_OR_DEPARTMENT_OTHER): Payer: Medicare Other

## 2013-10-30 VITALS — BP 136/58 | HR 108 | Temp 98.2°F | Resp 18 | Ht 70.0 in | Wt 199.7 lb

## 2013-10-30 DIAGNOSIS — Z95828 Presence of other vascular implants and grafts: Secondary | ICD-10-CM

## 2013-10-30 DIAGNOSIS — R5381 Other malaise: Secondary | ICD-10-CM

## 2013-10-30 DIAGNOSIS — C3430 Malignant neoplasm of lower lobe, unspecified bronchus or lung: Secondary | ICD-10-CM

## 2013-10-30 DIAGNOSIS — C3432 Malignant neoplasm of lower lobe, left bronchus or lung: Secondary | ICD-10-CM

## 2013-10-30 DIAGNOSIS — Z5112 Encounter for antineoplastic immunotherapy: Secondary | ICD-10-CM

## 2013-10-30 DIAGNOSIS — C3492 Malignant neoplasm of unspecified part of left bronchus or lung: Secondary | ICD-10-CM

## 2013-10-30 DIAGNOSIS — C3490 Malignant neoplasm of unspecified part of unspecified bronchus or lung: Secondary | ICD-10-CM

## 2013-10-30 DIAGNOSIS — R790 Abnormal level of blood mineral: Secondary | ICD-10-CM

## 2013-10-30 DIAGNOSIS — R5383 Other fatigue: Secondary | ICD-10-CM

## 2013-10-30 LAB — COMPREHENSIVE METABOLIC PANEL (CC13)
ALBUMIN: 3.2 g/dL — AB (ref 3.5–5.0)
ALT: 13 U/L (ref 0–55)
ANION GAP: 10 meq/L (ref 3–11)
AST: 17 U/L (ref 5–34)
Alkaline Phosphatase: 70 U/L (ref 40–150)
BUN: 19.3 mg/dL (ref 7.0–26.0)
CALCIUM: 9.1 mg/dL (ref 8.4–10.4)
CHLORIDE: 106 meq/L (ref 98–109)
CO2: 24 mEq/L (ref 22–29)
CREATININE: 0.9 mg/dL (ref 0.7–1.3)
Glucose: 110 mg/dl (ref 70–140)
Potassium: 3.9 mEq/L (ref 3.5–5.1)
Sodium: 140 mEq/L (ref 136–145)
Total Bilirubin: 0.49 mg/dL (ref 0.20–1.20)
Total Protein: 7.2 g/dL (ref 6.4–8.3)

## 2013-10-30 LAB — CBC WITH DIFFERENTIAL/PLATELET
BASO%: 0.8 % (ref 0.0–2.0)
BASOS ABS: 0.1 10*3/uL (ref 0.0–0.1)
EOS ABS: 0.4 10*3/uL (ref 0.0–0.5)
EOS%: 5.2 % (ref 0.0–7.0)
HCT: 29.5 % — ABNORMAL LOW (ref 38.4–49.9)
HEMOGLOBIN: 9.5 g/dL — AB (ref 13.0–17.1)
LYMPH#: 0.8 10*3/uL — AB (ref 0.9–3.3)
LYMPH%: 10.3 % — ABNORMAL LOW (ref 14.0–49.0)
MCH: 29 pg (ref 27.2–33.4)
MCHC: 32.3 g/dL (ref 32.0–36.0)
MCV: 89.6 fL (ref 79.3–98.0)
MONO#: 0.6 10*3/uL (ref 0.1–0.9)
MONO%: 7.5 % (ref 0.0–14.0)
NEUT#: 5.7 10*3/uL (ref 1.5–6.5)
NEUT%: 76.2 % — ABNORMAL HIGH (ref 39.0–75.0)
Platelets: 190 10*3/uL (ref 140–400)
RBC: 3.29 10*6/uL — ABNORMAL LOW (ref 4.20–5.82)
RDW: 18.7 % — AB (ref 11.0–14.6)
WBC: 7.5 10*3/uL (ref 4.0–10.3)

## 2013-10-30 LAB — MAGNESIUM (CC13): Magnesium: 1.5 mg/dl (ref 1.5–2.5)

## 2013-10-30 MED ORDER — HEPARIN SOD (PORK) LOCK FLUSH 100 UNIT/ML IV SOLN
500.0000 [IU] | Freq: Once | INTRAVENOUS | Status: AC | PRN
  Administered 2013-10-30: 500 [IU]
  Filled 2013-10-30: qty 5

## 2013-10-30 MED ORDER — SODIUM CHLORIDE 0.9 % IV SOLN
3.0000 mg/kg | Freq: Once | INTRAVENOUS | Status: AC
  Administered 2013-10-30: 280 mg via INTRAVENOUS
  Filled 2013-10-30: qty 28

## 2013-10-30 MED ORDER — SODIUM CHLORIDE 0.9 % IJ SOLN
10.0000 mL | INTRAMUSCULAR | Status: DC | PRN
  Administered 2013-10-30: 10 mL via INTRAVENOUS
  Filled 2013-10-30: qty 10

## 2013-10-30 MED ORDER — SODIUM CHLORIDE 0.9 % IJ SOLN
10.0000 mL | INTRAMUSCULAR | Status: DC | PRN
  Administered 2013-10-30: 10 mL
  Filled 2013-10-30: qty 10

## 2013-10-30 MED ORDER — SODIUM CHLORIDE 0.9 % IV SOLN
Freq: Once | INTRAVENOUS | Status: AC
  Administered 2013-10-30: 16:00:00 via INTRAVENOUS

## 2013-10-30 NOTE — Telephone Encounter (Signed)
gv adn printed appt scheda dn avs for pt for SEpt....sed added tx.

## 2013-10-30 NOTE — Progress Notes (Signed)
Patient on O2 @ 3L/min.   PAC already accessed.  Excellent blood return.

## 2013-10-30 NOTE — Patient Instructions (Signed)

## 2013-10-30 NOTE — Patient Instructions (Signed)
[facility] Discharge Instructions for Patients Receiving Chemotherapy  Today you received the following chemotherapy agents: Nivolumab  To help prevent nausea and vomiting after your treatment, we encourage you to take your nausea medication as prescribed by your physician.   If you develop nausea and vomiting that is not controlled by your nausea medication, call the clinic.   BELOW ARE SYMPTOMS THAT SHOULD BE REPORTED IMMEDIATELY:  *FEVER GREATER THAN 100.5 F  *CHILLS WITH OR WITHOUT FEVER  NAUSEA AND VOMITING THAT IS NOT CONTROLLED WITH YOUR NAUSEA MEDICATION  *UNUSUAL SHORTNESS OF BREATH  *UNUSUAL BRUISING OR BLEEDING  TENDERNESS IN MOUTH AND THROAT WITH OR WITHOUT PRESENCE OF ULCERS  *URINARY PROBLEMS  *BOWEL PROBLEMS  UNUSUAL RASH Items with * indicate a potential emergency and should be followed up as soon as possible.  Feel free to call the clinic you have any questions or concerns. The clinic phone number is [phone].

## 2013-10-30 NOTE — Progress Notes (Addendum)
[place] [phone]   [phone]  SHARED VISIT PROGRESS NOTE  Steven Downing, MD [place] [address]  PRINCIPAL DIAGNOSIS AND STAGE: Stage IIIB/IV non-small cell lung cancer, squamous cell carcinoma diagnosed in June 2012.   PRIOR THERAPY:  1) Status post a course of concurrent chemoradiation with weekly carboplatin and paclitaxel. Last dose of chemotherapy was given on November 01, 2010.  2) Systemic chemotherapy with carboplatin for an AUC of 5 given on day 1 and gemcitabine 1000 mg per meter square given on days 1 and 8 every 3 weeks status post 1 cycle. However due to thrombocytopenia from cycle 2 forward he will proceed with carboplatin for an AUC of 5 given on day 1 and gemcitabine at 800 mg per meter squared given on days 1 and 8 every 3 weeks. Status post 6 cycles with evidence for disease progression after cycle #6.  3) Palliative radiotherapy to the enlarging left lower lobe lung mass under the care of Dr. Tammi Clarke.  4) Systemic chemotherapy with docetaxel 75 mg/M2 every 3 weeks with Neulasta support, status post 2 cycles, first cycle was started on 06/12/2012. That was discontinued today secondary to intolerance.  5) Systemic chemotherapy with docetaxel 25 mg/M2 every week, first cycle is expected on 08/08/2012. Status post 7 doses. Last dose was given 09/18/2012.  CURRENT THERAPY: Immunotherapy with Nivolumab 3 mg/Kg every 2 weeks. First dose 09/18/2013. Status post  3 cycles  CHEMOTHERAPY INTENT: Palliative  CURRENT # OF CHEMOTHERAPY CYCLES: 4  CURRENT ANTIEMETICS: Zofran, dexamethasone and Compazine  CURRENT SMOKING STATUS: Currently nonsmoker  ORAL CHEMOTHERAPY AND CONSENT: None  CURRENT BISPHOSPHONATES USE: None  PAIN MANAGEMENT: 1/10. Ibuprofen when necessary.  NARCOTICS INDUCED CONSTIPATION: No constipation  LIVING WILL AND CODE STATUS: No CODE BLUE.   INTERVAL HISTORY: [name].  78 y.o. male returns to the clinic today for followup visit accompanied by his wife, [name]. He is currently being treated with immunotherapy in the form of Nivolumab. He is status post 3 cycles. Overall is tolerating the immunotherapy without difficulty. He reports that for the past week or so everything tastes salty to him. He sits in other foods taste a bit off as well. He is currently drinking at least one boost daily, sometimes 2 per day. He is wondering if he needs to continue taking the magnesium as he doesn't particularly like taking this tablet. He feels that his shortness of breath is stable although his wife feels that he gets short of breath with exertion a little easier. He denied any skin rash or diarrhea. He also reports occasional increased weakness on some days. He continues to have some fatigue. He denied having any significant chest pain, cough or hemoptysis. He has no significant weight loss or night sweats. The patient denied having any nausea or vomiting.   MEDICAL HISTORY: Past Medical History  Diagnosis Date  . Diabetes mellitus   . HTN (hypertension)   . High cholesterol   . Hx of radiation therapy 09/22/10 - 11/05/10    LLL lung  . History of chemotherapy   . History of radiation therapy 04/24/12-05/15/12    lllung 35Gy/14dx  . Lung cancer 2012    recurrence 12/2011    ALLERGIES:  is allergic to codeine and penicillins.  MEDICATIONS:  Current Outpatient Prescriptions  Medication Sig Dispense Refill  . acetaminophen (TYLENOL) 500 MG tablet Take 500 mg by mouth every 6 (six) hours as needed.      Marland Kitchen  albuterol (PROVENTIL) (5 MG/ML) 0.5% nebulizer solution Take 0.5 mLs (2.5 mg total) by nebulization every 4 (four) hours as needed for wheezing.  20 mL  3  . atorvastatin (LIPITOR) 20 MG tablet Take 20 mg by mouth daily.      Marland Kitchen glimepiride (AMARYL) 4 MG tablet Take 8 mg by mouth daily.       Marland Kitchen lactose free nutrition (BOOST PLUS) LIQD Take 237 mLs by mouth 2 (two) times daily as  needed.      . magnesium oxide (MAG-OX) 400 (241.3 MG) MG tablet Take 1 tablet (400 mg total) by mouth daily.  30 tablet  1  . pioglitazone (ACTOS) 30 MG tablet Take 30 mg by mouth daily.       . prochlorperazine (COMPAZINE) 10 MG tablet Take 1 tablet (10 mg total) by mouth every 6 (six) hours as needed for nausea or vomiting.  30 tablet  0  . tiotropium (SPIRIVA HANDIHALER) 18 MCG inhalation capsule Place 1 capsule (18 mcg total) into inhaler and inhale daily.  30 capsule  6  . traMADol (ULTRAM) 50 MG tablet Take 1 tablet (50 mg total) by mouth every 6 (six) hours as needed.  60 tablet  0  . latanoprost (XALATAN) 0.005 % ophthalmic solution Place 1 drop into both eyes at bedtime.        No current facility-administered medications for this visit.   Facility-Administered Medications Ordered in Other Visits  Medication Dose Route Frequency Provider Last Rate Last Dose  . sodium chloride 0.9 % injection 10 mL  10 mL Intracatheter PRN Steven Bears, MD   10 mL at 11/24/11 1637  . sodium chloride 0.9 % injection 10 mL  10 mL Intracatheter PRN Steven Bears, MD   10 mL at 10/30/13 1656    SURGICAL HISTORY:  Past Surgical History  Procedure Laterality Date  . Tonsillectomy    . Gastrostomy tube placement  2011    has been removed  . Portacath placement  08/2010    REVIEW OF SYSTEMS:  Constitutional: positive for fatigue Eyes: negative Ears, nose, mouth, throat, and face: negative Respiratory: positive for dyspnea on exertion Cardiovascular: negative Gastrointestinal: negative Genitourinary:negative Integument/breast: negative Hematologic/lymphatic: negative Musculoskeletal:negative Neurological: negative Behavioral/Psych: negative Endocrine: negative Allergic/Immunologic: negative   PHYSICAL EXAMINATION: General appearance: alert, cooperative, fatigued and no distress Head: Normocephalic, without obvious abnormality, atraumatic Neck: no adenopathy Lymph nodes: Cervical,  supraclavicular, and axillary nodes normal. Resp: clear to auscultation bilaterally Cardio: regular rate and rhythm, S1, S2 normal, no murmur, click, rub or gallop GI: soft, non-tender; bowel sounds normal; no masses,  no organomegaly Extremities: extremities normal, atraumatic, no cyanosis or edema Neurologic: Alert and oriented X 3, normal strength and tone. Normal symmetric reflexes. Normal coordination and gait  ECOG PERFORMANCE STATUS: 1 - Symptomatic but completely ambulatory  Blood pressure 136/58, pulse 108, temperature 98.2 F (36.8 C), temperature source Oral, resp. rate 18, height 5\' 10"  (1.778 m), weight 199 lb 11.2 oz (90.583 kg).  LABORATORY DATA: Lab Results  Component Value Date   WBC 7.5 10/30/2013   HGB 9.5* 10/30/2013   HCT 29.5* 10/30/2013   MCV 89.6 10/30/2013   PLT 190 10/30/2013      Chemistry      Component Value Date/Time   NA 140 10/30/2013 1001   NA 138 10/17/2012 0615   NA 134 10/19/2011 0817   K 3.9 10/30/2013 1001   K 3.4* 10/17/2012 0615   K 4.5 10/19/2011 0817   CL 104 10/17/2012  0615   CL 102 09/04/2012 1017   CL 99 10/19/2011 0817   CO2 24 10/30/2013 1001   CO2 27 10/17/2012 0615   CO2 27 10/19/2011 0817   BUN 19.3 10/30/2013 1001   BUN 6 10/17/2012 0615   BUN 13 10/19/2011 0817   CREATININE 0.9 10/30/2013 1001   CREATININE 0.82 10/17/2012 0615   CREATININE 1.4* 10/19/2011 0817      Component Value Date/Time   CALCIUM 9.1 10/30/2013 1001   CALCIUM 8.1* 10/17/2012 0615   CALCIUM 9.3 10/19/2011 0817   ALKPHOS 70 10/30/2013 1001   ALKPHOS 77 07/12/2012 0520   ALKPHOS 48 10/19/2011 0817   AST 17 10/30/2013 1001   AST 12 07/12/2012 0520   AST 19 10/19/2011 0817   ALT 13 10/30/2013 1001   ALT 13 07/12/2012 0520   ALT 26 10/19/2011 0817   BILITOT 0.49 10/30/2013 1001   BILITOT 0.6 07/12/2012 0520   BILITOT 0.90 10/19/2011 0817       RADIOGRAPHIC STUDIES: Dg Chest 1 View  09/10/2013   CLINICAL DATA:  Post left thoracentesis, known lung carcinoma  EXAM: CHEST - 1 VIEW  COMPARISON:  CT  chest of 09/06/2013  FINDINGS: The recently demonstrated left pleural effusion has largely been evacuated after left thoracentesis. No pneumothorax is seen. The right lung is clear. Right-sided Port-A-Cath remains. Heart size is stable.  IMPRESSION: Reduction in volume of left pleural effusion after left thoracentesis. No pneumothorax.   Electronically Signed   By: Ivar Drape M.D.   On: 09/10/2013 12:42   Ct Chest W Contrast  09/06/2013   CLINICAL DATA:  Chronic shortness of breath. Cough. History of lung cancer diagnosed in 2012 status post chemotherapy now complete. Restaging examination.  EXAM: CT CHEST WITH CONTRAST  TECHNIQUE: Multidetector CT imaging of the chest was performed during intravenous contrast administration.  CONTRAST:  33mL OMNIPAQUE IOHEXOL 300 MG/ML  SOLN  COMPARISON:  Chest CT 07/04/2013.  FINDINGS: Mediastinum: Compared to prior examinations there has been significant growth of a subcarinal nodal mass which currently measures up to 3.3 x 3.0 cm (image 27 of series 2), and is now clearly causing narrowing of the proximal left mainstem bronchus, with marked soft tissue fullness along the posterior aspect of the bronchus, suggesting some direct invasion. This also exerts mass effect upon the adjacent mid esophagus. There are also nonenlarged left paratracheal lymph nodes measuring up to 6 mm in short axis which are conspicuous, but similar to the prior examinations. Heart size is normal. There is no significant pericardial fluid, thickening or pericardial calcification. There is atherosclerosis of the thoracic aorta, the great vessels of the mediastinum and the coronary arteries, including calcified atherosclerotic plaque in the left anterior descending coronary artery. Esophagus is unremarkable in appearance. Right internal jugular single-lumen porta cath with tip terminating at the superior cavoatrial junction.  Lungs/Pleura: Continued interval increase in size of left lower lobe mass which  currently measures 6.7 x 4.8 cm (image 37 of series 2). Increasing moderate left pleural effusion which predominantly layers posteriorly. Moderate to severe centrilobular emphysema. No new suspicious pulmonary nodules are noted. Mild diffuse bronchial wall thickening. Mild septal thickening in the periphery of the right lower lobe and right middle lobe, similar to the prior.  Upper Abdomen: Multiple calcifications in the pancreatic head, likely sequela of prior episodes of pancreatitis.  Musculoskeletal: There are no aggressive appearing lytic or blastic lesions noted in the visualized portions of the skeleton.  IMPRESSION: 1. Significant progression of disease  compared to the prior study, with continued enlargement of the left lower lobe mass, enlarging moderate left pleural effusion (presumably malignant), and enlarging subcarinal nodal mass which may be invading the proximal left mainstem bronchus. 2. Additional incidental findings, as above.   Electronically Signed   By: Vinnie Langton M.D.   On: 09/06/2013 12:41   ASSESSMENT AND PLAN: This is a very pleasant 78 years old white male with stage IIIB/4 non-small cell lung cancer, squamous cell carcinoma.  He has been observation for almost a year.  His last CT scan of the chest showed evidence for disease progression with continued enlargement of the left lower lobe mass as well as enlarging moderate left pleural effusion and enlarging subcarinal lymph node. The patient is currently being treated with Nivolumab 3 mg/kg every 2 weeks. The patient was discussed with and also seen by Dr. Julien Nordmann. He will proceed with cycle #4 of immunotherapy with nivolumab. He will return in 2 weeks prior to cycle #5 the restaging CT scan of the chest, abdomen and pelvis with contrast to reevaluate his disease.  We will monitor his breathing closely on subsequent visits. His magnesium is pending from today.for now he will continue taking magnesium as previously prescribed. We  will recheck his magnesium level when he returns in 2 weeks.   He was advised to call immediately if he has any concerning symptoms in the interval. The patient voices understanding of current disease status and treatment options and is in agreement with the current care plan.  All questions were answered. The patient knows to call the clinic with any problems, questions or concerns. We can certainly see the patient much sooner if necessary.  Disclaimer: This note was dictated with voice recognition software. Similar sounding words can inadvertently be transcribed and may not be corrected upon review. Carlton Adam, PA-C 10/30/2013  ADDENDUM: Hematology/Oncology Attending: I had a face to face encounter with the patient. I recommended his care plan. This is a very pleasant 78 years old white male with metastatic non-small cell lung cancer, squamous cell carcinoma. The patient is currently undergoing immunotherapy with Nivolumab status post 3 cycles. He is tolerating his treatment fairly well with no significant adverse effects. He denied having any significant skin rash. He has no diarrhea. I recommended for the patient to proceed with cycle #4 today as scheduled. He would come back for followup visit in 2 weeks after repeating CT scan of the chest, abdomen and pelvis for restaging of his disease. He was advised to call immediately if he has any concerning symptoms in the interval.  Disclaimer: This note was dictated with voice recognition software. Similar sounding words can inadvertently be transcribed and may be missed upon review. Eilleen Kempf., MD 11/02/2013

## 2013-11-01 ENCOUNTER — Telehealth: Payer: Self-pay | Admitting: *Deleted

## 2013-11-01 NOTE — Telephone Encounter (Signed)
Pt's wife called stating pt has had 3 loose diarrhea stools starting this morning.  Per Dr Vista Mink, take imodium and call back later today to let us know if diarrhea has subsided.  If it continues he needs to take 100mg  prednisone daily.  Informed pt's wife to call us back.  She verbalized understanding.  SLJ

## 2013-11-01 NOTE — Patient Instructions (Signed)
Followup in 2 weeks with a restaging CT scan of the chest, abdomen and pelvis to reevaluate her disease

## 2013-11-05 ENCOUNTER — Other Ambulatory Visit: Payer: Self-pay | Admitting: *Deleted

## 2013-11-05 ENCOUNTER — Ambulatory Visit (HOSPITAL_BASED_OUTPATIENT_CLINIC_OR_DEPARTMENT_OTHER): Payer: Medicare Other | Admitting: Nurse Practitioner

## 2013-11-05 ENCOUNTER — Telehealth: Payer: Self-pay | Admitting: *Deleted

## 2013-11-05 ENCOUNTER — Ambulatory Visit (HOSPITAL_BASED_OUTPATIENT_CLINIC_OR_DEPARTMENT_OTHER): Payer: Medicare Other

## 2013-11-05 ENCOUNTER — Other Ambulatory Visit: Payer: Self-pay | Admitting: Internal Medicine

## 2013-11-05 VITALS — BP 125/65 | HR 88 | Temp 98.6°F | Resp 17

## 2013-11-05 DIAGNOSIS — E860 Dehydration: Secondary | ICD-10-CM

## 2013-11-05 DIAGNOSIS — C3490 Malignant neoplasm of unspecified part of unspecified bronchus or lung: Secondary | ICD-10-CM

## 2013-11-05 DIAGNOSIS — D6481 Anemia due to antineoplastic chemotherapy: Secondary | ICD-10-CM

## 2013-11-05 DIAGNOSIS — R197 Diarrhea, unspecified: Secondary | ICD-10-CM

## 2013-11-05 DIAGNOSIS — C3430 Malignant neoplasm of lower lobe, unspecified bronchus or lung: Secondary | ICD-10-CM

## 2013-11-05 DIAGNOSIS — C3400 Malignant neoplasm of unspecified main bronchus: Secondary | ICD-10-CM

## 2013-11-05 DIAGNOSIS — T451X5A Adverse effect of antineoplastic and immunosuppressive drugs, initial encounter: Secondary | ICD-10-CM

## 2013-11-05 DIAGNOSIS — C3492 Malignant neoplasm of unspecified part of left bronchus or lung: Secondary | ICD-10-CM

## 2013-11-05 DIAGNOSIS — R5383 Other fatigue: Secondary | ICD-10-CM

## 2013-11-05 DIAGNOSIS — R112 Nausea with vomiting, unspecified: Secondary | ICD-10-CM

## 2013-11-05 DIAGNOSIS — R5381 Other malaise: Secondary | ICD-10-CM

## 2013-11-05 DIAGNOSIS — E8809 Other disorders of plasma-protein metabolism, not elsewhere classified: Secondary | ICD-10-CM

## 2013-11-05 LAB — CBC WITH DIFFERENTIAL/PLATELET
BASO%: 1.1 % (ref 0.0–2.0)
Basophils Absolute: 0.1 10*3/uL (ref 0.0–0.1)
EOS ABS: 0.3 10*3/uL (ref 0.0–0.5)
EOS%: 3.5 % (ref 0.0–7.0)
HCT: 30 % — ABNORMAL LOW (ref 38.4–49.9)
HGB: 9.7 g/dL — ABNORMAL LOW (ref 13.0–17.1)
LYMPH%: 10.7 % — AB (ref 14.0–49.0)
MCH: 28.8 pg (ref 27.2–33.4)
MCHC: 32.3 g/dL (ref 32.0–36.0)
MCV: 89.3 fL (ref 79.3–98.0)
MONO#: 0.7 10*3/uL (ref 0.1–0.9)
MONO%: 6.9 % (ref 0.0–14.0)
NEUT%: 77.8 % — ABNORMAL HIGH (ref 39.0–75.0)
NEUTROS ABS: 7.3 10*3/uL — AB (ref 1.5–6.5)
PLATELETS: 214 10*3/uL (ref 140–400)
RBC: 3.36 10*6/uL — ABNORMAL LOW (ref 4.20–5.82)
RDW: 18.3 % — AB (ref 11.0–14.6)
WBC: 9.4 10*3/uL (ref 4.0–10.3)
lymph#: 1 10*3/uL (ref 0.9–3.3)

## 2013-11-05 LAB — COMPREHENSIVE METABOLIC PANEL (CC13)
ALK PHOS: 79 U/L (ref 40–150)
ALT: 14 U/L (ref 0–55)
AST: 21 U/L (ref 5–34)
Albumin: 3 g/dL — ABNORMAL LOW (ref 3.5–5.0)
Anion Gap: 11 mEq/L (ref 3–11)
BILIRUBIN TOTAL: 0.71 mg/dL (ref 0.20–1.20)
BUN: 20.3 mg/dL (ref 7.0–26.0)
CO2: 23 meq/L (ref 22–29)
Calcium: 9.4 mg/dL (ref 8.4–10.4)
Chloride: 104 mEq/L (ref 98–109)
Creatinine: 0.9 mg/dL (ref 0.7–1.3)
GLUCOSE: 178 mg/dL — AB (ref 70–140)
Potassium: 3.6 mEq/L (ref 3.5–5.1)
Sodium: 138 mEq/L (ref 136–145)
TOTAL PROTEIN: 7.4 g/dL (ref 6.4–8.3)

## 2013-11-05 MED ORDER — HEPARIN SOD (PORK) LOCK FLUSH 100 UNIT/ML IV SOLN
500.0000 [IU] | Freq: Once | INTRAVENOUS | Status: AC
  Administered 2013-11-05: 500 [IU] via INTRAVENOUS
  Filled 2013-11-05: qty 5

## 2013-11-05 MED ORDER — ONDANSETRON 8 MG/50ML IVPB (CHCC)
8.0000 mg | Freq: Once | INTRAVENOUS | Status: AC
  Administered 2013-11-05: 8 mg via INTRAVENOUS

## 2013-11-05 MED ORDER — SODIUM CHLORIDE 0.9 % IJ SOLN
10.0000 mL | INTRAMUSCULAR | Status: DC | PRN
  Administered 2013-11-05: 10 mL via INTRAVENOUS
  Filled 2013-11-05: qty 10

## 2013-11-05 MED ORDER — ONDANSETRON 8 MG/NS 50 ML IVPB
INTRAVENOUS | Status: AC
  Filled 2013-11-05: qty 8

## 2013-11-05 MED ORDER — SODIUM CHLORIDE 0.9 % IV SOLN
Freq: Once | INTRAVENOUS | Status: AC
  Administered 2013-11-05: 14:00:00 via INTRAVENOUS

## 2013-11-05 NOTE — Telephone Encounter (Signed)
Pt's wife called stating that starting yesterday pt was vomiting.  She got a refill on his nausea medication but it is not relieving his nausea.  He has vomiting 2x today.  She also states that diarrhea that started again as of this morning.  On 11/01/13 when she called and he started taking imodium it had resolved and this morning he had a good BM, but then she states "it started running out of him".  It is no longer controlled by imodium.  Per Dr Vista Mink, pt needs to come in today for IVF, zofran and to see cyndee bacon.  Pt's wife is aware of appts.  SLJ

## 2013-11-06 ENCOUNTER — Other Ambulatory Visit: Payer: Self-pay | Admitting: Nurse Practitioner

## 2013-11-06 ENCOUNTER — Telehealth: Payer: Self-pay | Admitting: Internal Medicine

## 2013-11-06 ENCOUNTER — Telehealth: Payer: Self-pay

## 2013-11-06 ENCOUNTER — Encounter: Payer: Self-pay | Admitting: Nurse Practitioner

## 2013-11-06 ENCOUNTER — Other Ambulatory Visit: Payer: Self-pay

## 2013-11-06 DIAGNOSIS — E860 Dehydration: Secondary | ICD-10-CM

## 2013-11-06 DIAGNOSIS — E8809 Other disorders of plasma-protein metabolism, not elsewhere classified: Secondary | ICD-10-CM | POA: Insufficient documentation

## 2013-11-06 DIAGNOSIS — T451X5A Adverse effect of antineoplastic and immunosuppressive drugs, initial encounter: Secondary | ICD-10-CM

## 2013-11-06 DIAGNOSIS — D6481 Anemia due to antineoplastic chemotherapy: Secondary | ICD-10-CM | POA: Insufficient documentation

## 2013-11-06 DIAGNOSIS — R197 Diarrhea, unspecified: Secondary | ICD-10-CM | POA: Insufficient documentation

## 2013-11-06 DIAGNOSIS — R112 Nausea with vomiting, unspecified: Secondary | ICD-10-CM | POA: Insufficient documentation

## 2013-11-06 MED ORDER — SODIUM CHLORIDE 0.9 % IV SOLN: 1000.0000 mL | INTRAVENOUS | Status: AC

## 2013-11-06 NOTE — Telephone Encounter (Signed)
s.w. pt and advised on 8.28 appt....pt ok and aware

## 2013-11-06 NOTE — Progress Notes (Signed)
[place]   Chief Complaint  Patient presents with  . Dehydration    HPI: [name]. 78 y.o. male diagnosed with lung cancer.  He is currently receiving immunotherapy agent Nivolumab on an every two-week basis.  Patient called the cancer Center today requesting urgent care visit.  He is complaining of worsening issues with nausea/vomiting; as well as diarrhea.  He feels that he is fairly well dehydrated today.  He denies any recent fever or chills.   HPI  ROS  Past Medical History  Diagnosis Date  . Diabetes mellitus   . HTN (hypertension)   . High cholesterol   . Hx of radiation therapy 09/22/10 - 11/05/10    LLL lung  . History of chemotherapy   . History of radiation therapy 04/24/12-05/15/12    lllung 35Gy/14dx  . Lung cancer 2012    recurrence 12/2011    Past Surgical History  Procedure Laterality Date  . Tonsillectomy    . Gastrostomy tube placement  2011    has been removed  . Portacath placement  08/2010    has Lung cancer; Obstructive chronic bronchitis without exacerbation Gold C; Hx of radiation therapy; High cholesterol; History of chemotherapy; Failure to thrive; Diabetes; Debility; Chronic diastolic heart failure; Chronic respiratory failure; DNR (do not resuscitate); Other malaise and fatigue; Antineoplastic chemotherapy induced anemia(285.3); Dehydration; Nausea with vomiting; Diarrhea; and Hypoalbuminemia on his problem list.     is allergic to codeine and penicillins.    Medication List       This list is accurate as of: 11/05/13 11:59 PM.  Always use your most recent med list.               acetaminophen 500 MG tablet  Commonly known as:  TYLENOL  Take 500 mg by mouth every 6 (six) hours as needed.     albuterol (5 MG/ML) 0.5% nebulizer solution  Commonly known as:  PROVENTIL  Take 0.5 mLs (2.5 mg total) by nebulization every 4 (four) hours as needed for wheezing.     atorvastatin 20 MG tablet  Commonly known as:   LIPITOR  Take 20 mg by mouth daily.     glimepiride 4 MG tablet  Commonly known as:  AMARYL  Take 8 mg by mouth daily.     lactose free nutrition Liqd  Take 237 mLs by mouth 2 (two) times daily as needed.     latanoprost 0.005 % ophthalmic solution  Commonly known as:  XALATAN  Place 1 drop into both eyes at bedtime.     magnesium oxide 400 (241.3 MG) MG tablet  Commonly known as:  MAG-OX  Take 1 tablet (400 mg total) by mouth daily.     ondansetron 8 MG tablet  Commonly known as:  ZOFRAN  TAKE ONE TABLET BY MOUTH EVERY 8 HOURS AS NEEDED FOR NAUSEA AND VOMITING     pioglitazone 30 MG tablet  Commonly known as:  ACTOS  Take 30 mg by mouth daily.     prochlorperazine 10 MG tablet  Commonly known as:  COMPAZINE  Take 1 tablet (10 mg total) by mouth every 6 (six) hours as needed for nausea or vomiting.     tiotropium 18 MCG inhalation capsule  Commonly known as:  SPIRIVA HANDIHALER  Place 1 capsule (18 mcg total) into inhaler and inhale daily.     traMADol 50 MG tablet  Commonly known as:  ULTRAM  Take 1 tablet (50 mg total) by  mouth every 6 (six) hours as needed.         PHYSICAL EXAMINATION  Vitals: BP 125/65, HR 88, O2 sat 100% on 2 liters as baseline, temp 98.6   Physical Exam  Nursing note and vitals reviewed. Constitutional: He is oriented to person, place, and time. Vital signs are normal. He appears dehydrated. He appears unhealthy.  HENT:  Head: Normocephalic and atraumatic.  Eyes: Conjunctivae are normal. Pupils are equal, round, and reactive to light.  Neck: Normal range of motion. Neck supple.  Cardiovascular: Normal rate, regular rhythm and normal heart sounds.  Exam reveals no friction rub.   No murmur heard. Pulmonary/Chest: Effort normal and breath sounds normal. No respiratory distress.  Patient remains on 2 L via nasal cannula as per his baseline.  Abdominal: Soft. Bowel sounds are normal. He exhibits no distension. There is no tenderness.    Musculoskeletal: Normal range of motion.  Lymphadenopathy:    He has no cervical adenopathy.  Neurological: He is alert and oriented to person, place, and time.  Skin: Skin is warm and dry. No rash noted. No erythema.  Psychiatric: Affect normal.    LABORATORY DATA:. CBC  Lab Results  Component Value Date   WBC 9.4 11/05/2013   RBC 3.36* 11/05/2013   HGB 9.7* 11/05/2013   HCT 30.0* 11/05/2013   PLT 214 11/05/2013   MCV 89.3 11/05/2013   MCH 28.8 11/05/2013   MCHC 32.3 11/05/2013   RDW 18.3* 11/05/2013   LYMPHSABS 1.0 11/05/2013   MONOABS 0.7 11/05/2013   EOSABS 0.3 11/05/2013   BASOSABS 0.1 11/05/2013     CMET  Lab Results  Component Value Date   NA 138 11/05/2013   K 3.6 11/05/2013   CL 104 10/17/2012   CO2 23 11/05/2013   GLUCOSE 178* 11/05/2013   BUN 20.3 11/05/2013   CREATININE 0.9 11/05/2013   CALCIUM 9.4 11/05/2013   PROT 7.4 11/05/2013   ALBUMIN 3.0* 11/05/2013   AST 21 11/05/2013   ALT 14 11/05/2013   ALKPHOS 79 11/05/2013   BILITOT 0.71 11/05/2013   GFRNONAA 84* 10/17/2012   GFRAA >90 10/17/2012   ASSESSMENT/PLAN:    Other malaise and fatigue  Assessment & Plan Patient continues to complain of some mild, chronic fatigue; but does feel that this is stable at present.  Patient was encouraged to remain as active as possible.   Antineoplastic chemotherapy induced anemia(285.3)  Assessment & Plan Chemotherapy-induced anemia does appear stable with a hemoglobin of 9.5.  Patient denies any worsening issues with either fatigue or shortness of breath with exertion.   Dehydration  Assessment & Plan Dehydrations secondary to both of vomiting and diarrhea; most likely do to a previous immunotherapy.  Patient did receive a 1 L normal saline IV fluid rehydration while in the infusion Center today.  Also, advised the patient and his wife that he may want to call in a return for additional IV fluid rehydration a later this week.   Nausea with vomiting  Assessment & Plan Patient  takes both Compazine and Zofran at home on an as-needed basis; and was given Zofran intravenously while in the infusion Center today.  Patient was observed able to tolerate clear liquids with no difficulty of following IV fluid rehydration and Zofran.   Diarrhea  Assessment & Plan Most likely, diarrhea is related to him immunotherapy regimen as well.  Patient states that taking Imodium does seem to be a fairly effective.  Patient will continue to take Imodium as directed.  Hypoalbuminemia  Assessment & Plan Albumin remains low; but stable.  Patient was encouraged to push protein is much as possible.   Lung cancer  Assessment & Plan Patient continues to receive immunotherapy Nivolumab 3 mg per kilogram on an every two-week basis.  Patient is status post 4 cycles so far.  Patient has plans for restaging scans this coming Friday, 11/08/2013.  He is is to follow back up to review these scans on 11/13/2013.   Patient stated understanding of all instructions; and was in agreement with this plan of care. The patient knows to call the clinic with any problems, questions or concerns.   Review/collaboration with Dr. Julien Nordmann regarding all aspects of patient's visit today.   Total time spent with patient was 25 minutes;  with greater than 75 percent of that time spent in face to face counseling regarding his symptoms, and coordination of care and follow up.  Disclaimer: This note was dictated with voice recognition software. Similar sounding words can inadvertently be transcribed and may not be corrected upon review.   Drue Second, NP 11/06/2013

## 2013-11-06 NOTE — Telephone Encounter (Signed)
Spoke with patient.  States he has had some nausea today and vomiting x2.  Loose stool x1 but overall patient feels a little better than yesterday.  Patient states he just took a zofran pill.  He is scheduled for a CT 11/08/13 and Retta Mac, NP would like him to come in for another liter of fluids after his CT.  Order for fluids has been put in per Hor[place]. POF has been sent to scheduling.

## 2013-11-06 NOTE — Assessment & Plan Note (Signed)
Patient takes both Compazine and Zofran at home on an as-needed basis; and was given Zofran intravenously while in the infusion Center today.  Patient was observed able to tolerate clear liquids with no difficulty of following IV fluid rehydration and Zofran.

## 2013-11-06 NOTE — Assessment & Plan Note (Signed)
Dehydrations secondary to both of vomiting and diarrhea; most likely do to a previous immunotherapy.  Patient did receive a 1 L normal saline IV fluid rehydration while in the infusion Center today.  Also, advised the patient and his wife that he may want to call in a return for additional IV fluid rehydration a later this week.

## 2013-11-06 NOTE — Assessment & Plan Note (Signed)
Most likely, diarrhea is related to him immunotherapy regimen as well.  Patient states that taking Imodium does seem to be a fairly effective.  Patient will continue to take Imodium as directed.

## 2013-11-06 NOTE — Assessment & Plan Note (Signed)
Albumin remains low; but stable.  Patient was encouraged to push protein is much as possible.

## 2013-11-06 NOTE — Assessment & Plan Note (Signed)
Patient continues to complain of some mild, chronic fatigue; but does feel that this is stable at present.  Patient was encouraged to remain as active as possible.

## 2013-11-06 NOTE — Assessment & Plan Note (Signed)
Patient continues to receive immunotherapy Nivolumab 3 mg per kilogram on an every two-week basis.  Patient is status post 4 cycles so far.  Patient has plans for restaging scans this coming Friday, 11/08/2013.  He is is to follow back up to review these scans on 11/13/2013.

## 2013-11-06 NOTE — Assessment & Plan Note (Signed)
Chemotherapy-induced anemia does appear stable with a hemoglobin of 9.5.  Patient denies any worsening issues with either fatigue or shortness of breath with exertion.

## 2013-11-08 ENCOUNTER — Other Ambulatory Visit: Payer: Self-pay | Admitting: Medical Oncology

## 2013-11-08 ENCOUNTER — Ambulatory Visit (HOSPITAL_BASED_OUTPATIENT_CLINIC_OR_DEPARTMENT_OTHER): Payer: Medicare Other

## 2013-11-08 ENCOUNTER — Encounter (HOSPITAL_COMMUNITY): Payer: Self-pay

## 2013-11-08 ENCOUNTER — Ambulatory Visit (HOSPITAL_COMMUNITY)
Admission: RE | Admit: 2013-11-08 | Discharge: 2013-11-08 | Disposition: A | Discharge status: Home / Self Care | Payer: Medicare Other | Source: Ambulatory Visit | Attending: Physician Assistant | Admitting: Physician Assistant

## 2013-11-08 VITALS — BP 111/51 | HR 92 | Temp 99.0°F | Resp 18

## 2013-11-08 DIAGNOSIS — J90 Pleural effusion, not elsewhere classified: Secondary | ICD-10-CM | POA: Diagnosis not present

## 2013-11-08 DIAGNOSIS — C3490 Malignant neoplasm of unspecified part of unspecified bronchus or lung: Secondary | ICD-10-CM | POA: Diagnosis present

## 2013-11-08 DIAGNOSIS — J438 Other emphysema: Secondary | ICD-10-CM | POA: Insufficient documentation

## 2013-11-08 DIAGNOSIS — I7090 Unspecified atherosclerosis: Secondary | ICD-10-CM | POA: Insufficient documentation

## 2013-11-08 DIAGNOSIS — E860 Dehydration: Secondary | ICD-10-CM

## 2013-11-08 DIAGNOSIS — C3430 Malignant neoplasm of lower lobe, unspecified bronchus or lung: Secondary | ICD-10-CM

## 2013-11-08 MED ORDER — IOHEXOL 300 MG/ML  SOLN
100.0000 mL | Freq: Once | INTRAMUSCULAR | Status: AC | PRN
  Administered 2013-11-08: 100 mL via INTRAVENOUS

## 2013-11-08 MED ORDER — SODIUM CHLORIDE 0.9 % IV SOLN
Freq: Once | INTRAVENOUS | Status: AC
  Administered 2013-11-08: 15:00:00 via INTRAVENOUS

## 2013-11-08 NOTE — Patient Instructions (Signed)
Dehydration, Adult Dehydration is when you lose more fluids from the body than you take in. Vital organs like the kidneys, brain, and heart cannot function without a proper amount of fluids and salt. Any loss of fluids from the body can cause dehydration.  CAUSES   Vomiting.  Diarrhea.  Excessive sweating.  Excessive urine output.  Fever. SYMPTOMS  Mild dehydration  Thirst.  Dry lips.  Slightly dry mouth. Moderate dehydration  Very dry mouth.  Sunken eyes.  Skin does not bounce back quickly when lightly pinched and released.  Dark urine and decreased urine production.  Decreased tear production.  Headache. Severe dehydration  Very dry mouth.  Extreme thirst.  Rapid, weak pulse (more than 100 beats per minute at rest).  Cold hands and feet.  Not able to sweat in spite of heat and temperature.  Rapid breathing.  Blue lips.  Confusion and lethargy.  Difficulty being awakened.  Minimal urine production.  No tears. DIAGNOSIS  Your caregiver will diagnose dehydration based on your symptoms and your exam. Blood and urine tests will help confirm the diagnosis. The diagnostic evaluation should also identify the cause of dehydration. TREATMENT  Treatment of mild or moderate dehydration can often be done at home by increasing the amount of fluids that you drink. It is best to drink small amounts of fluid more often. Drinking too much at one time can make vomiting worse. Refer to the home care instructions below. Severe dehydration needs to be treated at the hospital where you will probably be given intravenous (IV) fluids that contain water and electrolytes. HOME CARE INSTRUCTIONS   Ask your caregiver about specific rehydration instructions.  Drink enough fluids to keep your urine clear or pale yellow.  Drink small amounts frequently if you have nausea and vomiting.  Eat as you normally do.  Avoid:  Foods or drinks high in sugar.  Carbonated  drinks.  Juice.  Extremely hot or cold fluids.  Drinks with caffeine.  Fatty, greasy foods.  Alcohol.  Tobacco.  Overeating.  Gelatin desserts.  Wash your hands well to avoid spreading bacteria and viruses.  Only take over-the-counter or prescription medicines for pain, discomfort, or fever as directed by your caregiver.  Ask your caregiver if you should continue all prescribed and over-the-counter medicines.  Keep all follow-up appointments with your caregiver. SEEK MEDICAL CARE IF:  You have abdominal pain and it increases or stays in one area (localizes).  You have a rash, stiff neck, or severe headache.  You are irritable, sleepy, or difficult to awaken.  You are weak, dizzy, or extremely thirsty. SEEK IMMEDIATE MEDICAL CARE IF:   You are unable to keep fluids down or you get worse despite treatment.  You have frequent episodes of vomiting or diarrhea.  You have blood or green matter (bile) in your vomit.  You have blood in your stool or your stool looks black and tarry.  You have not urinated in 6 to 8 hours, or you have only urinated a small amount of very dark urine.  You have a fever.  You faint. MAKE SURE YOU:   Understand these instructions.  Will watch your condition.  Will get help right away if you are not doing well or get worse. Document Released: 02/28/2005 Document Revised: 05/23/2011 Document Reviewed: 10/18/2010 Exit[employer] Patient Information 2015 [employer]. This information is not intended to replace advice given to you by your health care provider. Make sure you discuss any questions you have with your health care   provider.  

## 2013-11-13 ENCOUNTER — Ambulatory Visit: Payer: Medicare Other

## 2013-11-13 ENCOUNTER — Encounter: Payer: Self-pay | Admitting: Physician Assistant

## 2013-11-13 ENCOUNTER — Ambulatory Visit (HOSPITAL_BASED_OUTPATIENT_CLINIC_OR_DEPARTMENT_OTHER): Payer: Medicare Other | Admitting: Physician Assistant

## 2013-11-13 ENCOUNTER — Other Ambulatory Visit: Payer: Medicare Other

## 2013-11-13 ENCOUNTER — Other Ambulatory Visit (HOSPITAL_BASED_OUTPATIENT_CLINIC_OR_DEPARTMENT_OTHER): Payer: Medicare Other

## 2013-11-13 ENCOUNTER — Ambulatory Visit (HOSPITAL_BASED_OUTPATIENT_CLINIC_OR_DEPARTMENT_OTHER): Payer: Medicare Other

## 2013-11-13 VITALS — BP 103/80 | HR 80 | Temp 98.1°F | Resp 18 | Ht 70.0 in | Wt 196.2 lb

## 2013-11-13 DIAGNOSIS — C3432 Malignant neoplasm of lower lobe, left bronchus or lung: Secondary | ICD-10-CM

## 2013-11-13 DIAGNOSIS — C3430 Malignant neoplasm of lower lobe, unspecified bronchus or lung: Secondary | ICD-10-CM

## 2013-11-13 DIAGNOSIS — Z95828 Presence of other vascular implants and grafts: Secondary | ICD-10-CM

## 2013-11-13 DIAGNOSIS — C3490 Malignant neoplasm of unspecified part of unspecified bronchus or lung: Secondary | ICD-10-CM

## 2013-11-13 DIAGNOSIS — C3492 Malignant neoplasm of unspecified part of left bronchus or lung: Secondary | ICD-10-CM

## 2013-11-13 DIAGNOSIS — R5383 Other fatigue: Secondary | ICD-10-CM

## 2013-11-13 DIAGNOSIS — Z23 Encounter for immunization: Secondary | ICD-10-CM

## 2013-11-13 DIAGNOSIS — R5381 Other malaise: Secondary | ICD-10-CM

## 2013-11-13 DIAGNOSIS — Z5112 Encounter for antineoplastic immunotherapy: Secondary | ICD-10-CM

## 2013-11-13 LAB — COMPREHENSIVE METABOLIC PANEL (CC13)
ALBUMIN: 2.3 g/dL — AB (ref 3.5–5.0)
ALT: 15 U/L (ref 0–55)
ANION GAP: 10 meq/L (ref 3–11)
AST: 18 U/L (ref 5–34)
Alkaline Phosphatase: 75 U/L (ref 40–150)
BUN: 16.4 mg/dL (ref 7.0–26.0)
CHLORIDE: 103 meq/L (ref 98–109)
CO2: 27 mEq/L (ref 22–29)
Calcium: 8.2 mg/dL — ABNORMAL LOW (ref 8.4–10.4)
Creatinine: 1 mg/dL (ref 0.7–1.3)
GLUCOSE: 82 mg/dL (ref 70–140)
POTASSIUM: 3.4 meq/L — AB (ref 3.5–5.1)
SODIUM: 140 meq/L (ref 136–145)
TOTAL PROTEIN: 6.5 g/dL (ref 6.4–8.3)
Total Bilirubin: 0.4 mg/dL (ref 0.20–1.20)

## 2013-11-13 LAB — CBC WITH DIFFERENTIAL/PLATELET
BASO%: 1.3 % (ref 0.0–2.0)
Basophils Absolute: 0.1 10*3/uL (ref 0.0–0.1)
EOS ABS: 0.4 10*3/uL (ref 0.0–0.5)
EOS%: 4.7 % (ref 0.0–7.0)
HCT: 27 % — ABNORMAL LOW (ref 38.4–49.9)
HGB: 8.7 g/dL — ABNORMAL LOW (ref 13.0–17.1)
LYMPH#: 1 10*3/uL (ref 0.9–3.3)
LYMPH%: 11.9 % — ABNORMAL LOW (ref 14.0–49.0)
MCH: 28.6 pg (ref 27.2–33.4)
MCHC: 32.2 g/dL (ref 32.0–36.0)
MCV: 88.8 fL (ref 79.3–98.0)
MONO#: 0.6 10*3/uL (ref 0.1–0.9)
MONO%: 7.9 % (ref 0.0–14.0)
NEUT#: 6 10*3/uL (ref 1.5–6.5)
NEUT%: 74.2 % (ref 39.0–75.0)
Platelets: 236 10*3/uL (ref 140–400)
RBC: 3.04 10*6/uL — ABNORMAL LOW (ref 4.20–5.82)
RDW: 18 % — ABNORMAL HIGH (ref 11.0–14.6)
WBC: 8.1 10*3/uL (ref 4.0–10.3)

## 2013-11-13 LAB — TSH CHCC: TSH: 2.593 m(IU)/L (ref 0.320–4.118)

## 2013-11-13 MED ORDER — INFLUENZA VAC SPLIT QUAD 0.5 ML IM SUSP: 0.5000 mL | Freq: Once | INTRAMUSCULAR | Status: DC

## 2013-11-13 MED ORDER — INFLUENZA VAC SPLIT QUAD 0.5 ML IM SUSY
0.5000 mL | PREFILLED_SYRINGE | INTRAMUSCULAR | Status: AC
  Administered 2013-11-13: 0.5 mL via INTRAMUSCULAR
  Filled 2013-11-13: qty 0.5

## 2013-11-13 MED ORDER — SODIUM CHLORIDE 0.9 % IJ SOLN
10.0000 mL | INTRAMUSCULAR | Status: DC | PRN
  Administered 2013-11-13: 10 mL via INTRAVENOUS
  Filled 2013-11-13: qty 10

## 2013-11-13 MED ORDER — HEPARIN SOD (PORK) LOCK FLUSH 100 UNIT/ML IV SOLN
500.0000 [IU] | Freq: Once | INTRAVENOUS | Status: AC | PRN
  Administered 2013-11-13: 500 [IU]
  Filled 2013-11-13: qty 5

## 2013-11-13 MED ORDER — SODIUM CHLORIDE 0.9 % IV SOLN
Freq: Once | INTRAVENOUS | Status: AC
  Administered 2013-11-13: 12:00:00 via INTRAVENOUS

## 2013-11-13 MED ORDER — SODIUM CHLORIDE 0.9 % IJ SOLN
10.0000 mL | INTRAMUSCULAR | Status: DC | PRN
  Administered 2013-11-13: 10 mL
  Filled 2013-11-13: qty 10

## 2013-11-13 MED ORDER — NIVOLUMAB CHEMO INJECTION 100 MG/10ML
3.0000 mg/kg | Freq: Once | INTRAVENOUS | Status: AC
  Administered 2013-11-13: 280 mg via INTRAVENOUS
  Filled 2013-11-13: qty 28

## 2013-11-13 NOTE — Assessment & Plan Note (Signed)
Patient is a 78 year old Caucasian male recently diagnosed with stage IIIB/4 non-small cell lung cancer, squamous cell carcinoma in June of 2012. He is status post a course of concurrent chemoradiation, status post 6 cycles of systemic chemotherapy with carboplatin and gemcitabine followed by palliative radiotherapy to the enlarging left lower lobe lung mass as well as 2 cycles of systemic chemotherapy with docetaxel 75 mg /m2 given every 3 weeks with Neulasta support, discontinued secondary to intolerance, then 7 weekly cycles of systemic chemotherapy with docetaxel at 25 mg per meter squared last given 09/18/2012. Found recently to have evidence for disease progression and now being treated with immunotherapy with Nivoulmab , status post 4 cycles. His recent restaging CT scan of the chest, abdomen and pelvis revealed slight increase in size of the left lung mass, however this may represent a false progression related to the immunotherapy. We will plan to continue with the immunotherapy with Nivolumab as scheduled. His diarrhea has subsided and he will inform us if he develops any further episodes. He'll followup in 2 weeks prior to his next scheduled cycle of immunotherapy with Nivolumab.

## 2013-11-13 NOTE — Progress Notes (Signed)
[facility] [phone]   [phone]  SHARED VISIT PROGRESS NOTE  Leonard Downing, MD [place] [address]  PRINCIPAL DIAGNOSIS AND STAGE: Stage IIIB/IV non-small cell lung cancer, squamous cell carcinoma diagnosed in June 2012.   PRIOR THERAPY:  1) Status post a course of concurrent chemoradiation with weekly carboplatin and paclitaxel. Last dose of chemotherapy was given on November 01, 2010.  2) Systemic chemotherapy with carboplatin for an AUC of 5 given on day 1 and gemcitabine 1000 mg per meter square given on days 1 and 8 every 3 weeks status post 1 cycle. However due to thrombocytopenia from cycle 2 forward he will proceed with carboplatin for an AUC of 5 given on day 1 and gemcitabine at 800 mg per meter squared given on days 1 and 8 every 3 weeks. Status post 6 cycles with evidence for disease progression after cycle #6.  3) Palliative radiotherapy to the enlarging left lower lobe lung mass under the care of Dr. Tammi Klippel.  4) Systemic chemotherapy with docetaxel 75 mg/M2 every 3 weeks with Neulasta support, status post 2 cycles, first cycle was started on 06/12/2012. That was discontinued today secondary to intolerance.  5) Systemic chemotherapy with docetaxel 25 mg/M2 every week, first cycle is expected on 08/08/2012. Status post 7 doses. Last dose was given 09/18/2012.  CURRENT THERAPY: Immunotherapy with Nivolumab 3 mg/Kg every 2 weeks. First dose 09/18/2013. Status post  4 cycles  CHEMOTHERAPY INTENT: Palliative  CURRENT # OF CHEMOTHERAPY CYCLES: 5  CURRENT ANTIEMETICS: Zofran, dexamethasone and Compazine  CURRENT SMOKING STATUS: Currently nonsmoker  ORAL CHEMOTHERAPY AND CONSENT: None  CURRENT BISPHOSPHONATES USE: None  PAIN MANAGEMENT: 1/10. Ibuprofen when necessary.  NARCOTICS INDUCED CONSTIPATION: No constipation  LIVING WILL AND CODE STATUS: No CODE BLUE.   INTERVAL HISTORY: [name].  78 y.o. male returns to the clinic today for followup visit accompanied by his wife, [name]. He is currently being treated with immunotherapy in the form of Nivolumab. He is status post 4 cycles. He reports developing diarrhea about 4 days after his last cycle of immunotherapy. He complains of feeling weak. He had to come in for IV fluids twice last week and received anti-emetics as well. He reports more nausea having to take his antiemetics more frequently. He continues to complain that his food tastes salty.  Otherwise he is tolerating the immunotherapy without difficulty. He feels that his shortness of breath is stable although his wife feels that he gets short of breath with exertion a little easier. He denied any skin rash or current diarrhea. His last episode of diarrhea last dose of Imodium were both on 11/09/2013.  He continues to have some fatigue. He denied having any significant chest pain, cough or hemoptysis. He has no night sweats. His weight is down about 3.5 pounds from 10/30/2013.   MEDICAL HISTORY: Past Medical History  Diagnosis Date  . Diabetes mellitus   . HTN (hypertension)   . High cholesterol   . Hx of radiation therapy 09/22/10 - 11/05/10    LLL lung  . History of chemotherapy   . History of radiation therapy 04/24/12-05/15/12    lllung 35Gy/14dx  . Lung cancer 2012    recurrence 12/2011    ALLERGIES:  is allergic to codeine and penicillins.  MEDICATIONS:  Current Outpatient Prescriptions  Medication Sig Dispense Refill  . acetaminophen (TYLENOL) 500 MG tablet Take 500 mg by mouth every 6 (six) hours as  needed.      Marland Kitchen albuterol (PROVENTIL) (5 MG/ML) 0.5% nebulizer solution Take 0.5 mLs (2.5 mg total) by nebulization every 4 (four) hours as needed for wheezing.  20 mL  3  . atorvastatin (LIPITOR) 20 MG tablet Take 20 mg by mouth daily.      Marland Kitchen glimepiride (AMARYL) 4 MG tablet Take 8 mg by mouth daily.       Marland Kitchen lactose free nutrition (BOOST PLUS) LIQD Take 237 mLs by mouth 2  (two) times daily as needed.      . latanoprost (XALATAN) 0.005 % ophthalmic solution Place 1 drop into both eyes at bedtime.       . magnesium oxide (MAG-OX) 400 (241.3 MG) MG tablet Take 1 tablet (400 mg total) by mouth daily.  30 tablet  1  . ondansetron (ZOFRAN) 8 MG tablet TAKE ONE TABLET BY MOUTH EVERY 8 HOURS AS NEEDED FOR NAUSEA AND VOMITING  30 tablet  2  . pioglitazone (ACTOS) 30 MG tablet Take 30 mg by mouth daily.       . prochlorperazine (COMPAZINE) 10 MG tablet Take 1 tablet (10 mg total) by mouth every 6 (six) hours as needed for nausea or vomiting.  30 tablet  0  . tiotropium (SPIRIVA HANDIHALER) 18 MCG inhalation capsule Place 1 capsule (18 mcg total) into inhaler and inhale daily.  30 capsule  6  . traMADol (ULTRAM) 50 MG tablet Take 1 tablet (50 mg total) by mouth every 6 (six) hours as needed.  60 tablet  0   No current facility-administered medications for this visit.   Facility-Administered Medications Ordered in Other Visits  Medication Dose Route Frequency Provider Last Rate Last Dose  . 0.9 %  sodium chloride infusion  1,000 mL Intravenous Continuous Drue Second, NP      . sodium chloride 0.9 % injection 10 mL  10 mL Intracatheter PRN Curt Bears, MD   10 mL at 11/24/11 1637  . sodium chloride 0.9 % injection 10 mL  10 mL Intracatheter PRN Curt Bears, MD   10 mL at 11/13/13 1338    SURGICAL HISTORY:  Past Surgical History  Procedure Laterality Date  . Tonsillectomy    . Gastrostomy tube placement  2011    has been removed  . Portacath placement  08/2010    REVIEW OF SYSTEMS:  Constitutional: positive for anorexia, fatigue and weight loss Eyes: negative Ears, nose, mouth, throat, and face: negative Respiratory: positive for dyspnea on exertion Cardiovascular: negative Gastrointestinal: positive for diarrhea, nausea and vomiting Genitourinary:negative Integument/breast: negative Hematologic/lymphatic:  negative Musculoskeletal:negative Neurological: positive for weakness Behavioral/Psych: negative Endocrine: negative Allergic/Immunologic: negative   PHYSICAL EXAMINATION: General appearance: alert, cooperative, fatigued and no distress Head: Normocephalic, without obvious abnormality, atraumatic Neck: no adenopathy Lymph nodes: Cervical, supraclavicular, and axillary nodes normal. Resp: clear to auscultation bilaterally Cardio: regular rate and rhythm, S1, S2 normal, no murmur, click, rub or gallop GI: soft, non-tender; bowel sounds normal; no masses,  no organomegaly Extremities: extremities normal, atraumatic, no cyanosis or edema Neurologic: Alert and oriented X 3, normal strength and tone. Normal symmetric reflexes. Normal coordination and gait  ECOG PERFORMANCE STATUS: 2 - Symptomatic, <50% confined to bed  Blood pressure 103/80, pulse 80, temperature 98.1 F (36.7 C), temperature source Oral, resp. rate 18, height 5\' 10"  (1.778 m), weight 196 lb 3.2 oz (88.996 kg).  LABORATORY DATA: Lab Results  Component Value Date   WBC 8.1 11/13/2013   HGB 8.7* 11/13/2013   HCT 27.0* 11/13/2013  MCV 88.8 11/13/2013   PLT 236 11/13/2013      Chemistry      Component Value Date/Time   NA 140 11/13/2013 0958   NA 138 10/17/2012 0615   NA 134 10/19/2011 0817   K 3.4* 11/13/2013 0958   K 3.4* 10/17/2012 0615   K 4.5 10/19/2011 0817   CL 104 10/17/2012 0615   CL 102 09/04/2012 1017   CL 99 10/19/2011 0817   CO2 27 11/13/2013 0958   CO2 27 10/17/2012 0615   CO2 27 10/19/2011 0817   BUN 16.4 11/13/2013 0958   BUN 6 10/17/2012 0615   BUN 13 10/19/2011 0817   CREATININE 1.0 11/13/2013 0958   CREATININE 0.82 10/17/2012 0615   CREATININE 1.4* 10/19/2011 0817      Component Value Date/Time   CALCIUM 8.2* 11/13/2013 0958   CALCIUM 8.1* 10/17/2012 0615   CALCIUM 9.3 10/19/2011 0817   ALKPHOS 75 11/13/2013 0958   ALKPHOS 77 07/12/2012 0520   ALKPHOS 48 10/19/2011 0817   AST 18 11/13/2013 0958   AST 12 07/12/2012 0520   AST 19 10/19/2011  0817   ALT 15 11/13/2013 0958   ALT 13 07/12/2012 0520   ALT 26 10/19/2011 0817   BILITOT 0.40 11/13/2013 0958   BILITOT 0.6 07/12/2012 0520   BILITOT 0.90 10/19/2011 0817       RADIOGRAPHIC STUDIES: Ct Chest W Contrast  11/08/2013   CLINICAL DATA:  Lung cancer  EXAM: CT CHEST, ABDOMEN, AND PELVIS WITH CONTRAST  TECHNIQUE: Multidetector CT imaging of the chest, abdomen and pelvis was performed following the standard protocol during bolus administration of intravenous contrast.  CONTRAST:  18mL OMNIPAQUE IOHEXOL 300 MG/ML  SOLN  COMPARISON:  09/06/2013  FINDINGS: CT CHEST FINDINGS  Moderate changes of emphysema identified. The heart size appears normal. Moderate left pleural effusion is identified. Mass within the left lower lobe measures 5.4 x 6.7 cm, image 41/series 2. This is compared with 4.8 x 6.7 cm previously. Adjacent nodule measures 2.6 x 2.6 cm, image 36/ series 2. Previously 2.1 x 2.2 cm.  Sub- carinal mass measures 2.6 x 3.0 cm, image 30/series 2. Previously 2.7 x 3.0 cm. No new or progressive mediastinal adenopathy. There is no axillary or supraclavicular adenopathy.  CT ABDOMEN AND PELVIS FINDINGS  No suspicious liver abnormality. The gallbladder appears normal. No biliary dilatation. Calcifications within the head of pancreas noted. There is fatty replacement of the body and tail of pancreas. The spleen is normal. The adrenal glands are both normal. Bilateral renal cysts noted. The urinary bladder appears normal. Prostate gland and seminal vesicles are unremarkable.  Normal caliber of the abdominal aorta. There is calcified atherosclerotic disease involving the abdominal aorta. The infrarenal abdominal aorta are measure 2.6 cm. No enlarged retroperitoneal adenopathy. No mesenteric adenopathy. No enlarged pelvic or inguinal adenopathy. No free fluid or fluid collections within the abdomen or pelvis.  The stomach appears normal. The small bowel loops have a normal course and caliber. The appendix is  visualized and appears normal. Normal appearance of the colon.  Review of the visualized bony structures is significant for lumbar degenerative disc disease.  IMPRESSION:  1. Left lung mass is mildly increased in size in the interval. 2. Persistent left pleural effusion 3. Sub- carinal tumor is not significantly changed from previous exam. 4. Emphysema 5. Atherosclerotic disease. 6. Ectasia of the abdominal aorta. Ectatic abdominal aorta at risk for aneurysm development. Recommend follow up by [place] in 5 years. This recommendation follows  ACR consensus guidelines: White Paper of the ACR Incidental Findings Committee II on Vascular Findings. J Am Coll Radiol 2013; 10:789-794.   Electronically Signed   By: Kerby Moors M.D.   On: 11/08/2013 14:38   Ct Abdomen Pelvis W Contrast  11/08/2013   CLINICAL DATA:  Lung cancer  EXAM: CT CHEST, ABDOMEN, AND PELVIS WITH CONTRAST  TECHNIQUE: Multidetector CT imaging of the chest, abdomen and pelvis was performed following the standard protocol during bolus administration of intravenous contrast.  CONTRAST:  129mL OMNIPAQUE IOHEXOL 300 MG/ML  SOLN  COMPARISON:  09/06/2013  FINDINGS: CT CHEST FINDINGS  Moderate changes of emphysema identified. The heart size appears normal. Moderate left pleural effusion is identified. Mass within the left lower lobe measures 5.4 x 6.7 cm, image 41/series 2. This is compared with 4.8 x 6.7 cm previously. Adjacent nodule measures 2.6 x 2.6 cm, image 36/ series 2. Previously 2.1 x 2.2 cm.  Sub- carinal mass measures 2.6 x 3.0 cm, image 30/series 2. Previously 2.7 x 3.0 cm. No new or progressive mediastinal adenopathy. There is no axillary or supraclavicular adenopathy.  CT ABDOMEN AND PELVIS FINDINGS  No suspicious liver abnormality. The gallbladder appears normal. No biliary dilatation. Calcifications within the head of pancreas noted. There is fatty replacement of the body and tail of pancreas. The spleen is normal. The adrenal glands are both  normal. Bilateral renal cysts noted. The urinary bladder appears normal. Prostate gland and seminal vesicles are unremarkable.  Normal caliber of the abdominal aorta. There is calcified atherosclerotic disease involving the abdominal aorta. The infrarenal abdominal aorta are measure 2.6 cm. No enlarged retroperitoneal adenopathy. No mesenteric adenopathy. No enlarged pelvic or inguinal adenopathy. No free fluid or fluid collections within the abdomen or pelvis.  The stomach appears normal. The small bowel loops have a normal course and caliber. The appendix is visualized and appears normal. Normal appearance of the colon.  Review of the visualized bony structures is significant for lumbar degenerative disc disease.  IMPRESSION:  1. Left lung mass is mildly increased in size in the interval. 2. Persistent left pleural effusion 3. Sub- carinal tumor is not significantly changed from previous exam. 4. Emphysema 5. Atherosclerotic disease. 6. Ectasia of the abdominal aorta. Ectatic abdominal aorta at risk for aneurysm development. Recommend follow up by [place] in 5 years. This recommendation follows ACR consensus guidelines: White Paper of the ACR Incidental Findings Committee II on Vascular Findings. J Am Coll Radiol 2013; 10:789-794.   Electronically Signed   By: Kerby Moors M.D.   On: 11/08/2013 14:38   ASSESSMENT AND PLAN: This is a very pleasant 79 years old white male with stage IIIB/4 non-small cell lung cancer, squamous cell carcinoma.  He has been observation for almost a year.  His last CT scan of the chest showed evidence for disease progression with continued enlargement of the left lower lobe mass as well as enlarging moderate left pleural effusion and enlarging subcarinal lymph node. The patient is currently being treated with Nivolumab 3 mg/kg every 2 weeks. He is status post 4 cycles. His recent restaging CT scan showed slight enlargement of the left lung mass however this may represent  pseudo-progression related to the immunotherapy with Nivolumab. The patient was discussed with and also seen by Dr. Julien Nordmann. He will proceed with cycle #5 of immunotherapy with nivolumab. He will return in 2 weeks prior to cycle #6. He is a bit weaker than he has been on previous visits. Continue to monitor this  closely. Patient was instructed that should he develop diarrhea again he is to notify us immediately. Both the patient and his wife voiced understanding. He was advised to call immediately if he has any concerning symptoms in the interval. The patient voices understanding of current disease status and treatment options and is in agreement with the current care plan.  All questions were answered. The patient knows to call the clinic with any problems, questions or concerns. We can certainly see the patient much sooner if necessary.  Lung cancer Patient is a 78 year old Caucasian male recently diagnosed with stage IIIB/4 non-small cell lung cancer, squamous cell carcinoma in June of 2012. He is status post a course of concurrent chemoradiation, status post 6 cycles of systemic chemotherapy with carboplatin and gemcitabine followed by palliative radiotherapy to the enlarging left lower lobe lung mass as well as 2 cycles of systemic chemotherapy with docetaxel 75 mg /m2 given every 3 weeks with Neulasta support, discontinued secondary to intolerance, then 7 weekly cycles of systemic chemotherapy with docetaxel at 25 mg per meter squared last given 09/18/2012. Found recently to have evidence for disease progression and now being treated with immunotherapy with Nivoulmab , status post 4 cycles. His recent restaging CT scan of the chest, abdomen and pelvis revealed slight increase in size of the left lung mass, however this may represent a false progression related to the immunotherapy. We will plan to continue with the immunotherapy with Nivolumab as scheduled. His diarrhea has subsided and he will inform  us if he develops any further episodes. He'll followup in 2 weeks prior to his next scheduled cycle of immunotherapy with Nivolumab.   Disclaimer: This note was dictated with voice recognition software. Similar sounding words can inadvertently be transcribed and may not be corrected upon review. Carlton Adam, PA-C 11/13/2013  ADDENDUM: Hematology/Oncology Attending: I had a face to face encounter with the patient today. I recommended his care plan. This is a very pleasant 78 years old white male with metastatic non-small cell lung cancer, squamous cell carcinoma currently undergoing immunotherapy with Nivolumab status post 4 cycles. He tolerated his previous treatment fairly well except for few days of diarrhea after cycle #4 of which is completely resolved at this point. His recent CT scan of the chest showed evidence for mild disease progression. I discussed the scan results with the patient and his wife and given him the option of continuing treatment with Nivolumab and close monitoring of his disease on the upcoming scan taken into consideration the phenomenon of pseudo-progression. I also gave him the option of discontinuing treatment and nothing to a different chemotherapy regimen. The patient and his wife would like to continue his current treatment with Nivolumab. He'll receive cycle #5 today. He would come back for followup visit in 2 weeks for evaluation before starting cycle #6. He was advised to call immediately if he develops any other adverse effect especially diarrhea.  Disclaimer: This note was dictated with voice recognition software. Similar sounding words can inadvertently be transcribed and may be missed upon review. Eilleen Kempf., MD 11/13/2013

## 2013-11-13 NOTE — Patient Instructions (Addendum)
[facility] Discharge Instructions for Patients Receiving Chemotherapy  Today you received the following chemotherapy agents:  Nivolumab  To help prevent nausea and vomiting after your treatment, we encourage you to take your nausea medication as ordered per MD.   If you develop nausea and vomiting that is not controlled by your nausea medication, call the clinic.   BELOW ARE SYMPTOMS THAT SHOULD BE REPORTED IMMEDIATELY:  *FEVER GREATER THAN 100.5 F  *CHILLS WITH OR WITHOUT FEVER  NAUSEA AND VOMITING THAT IS NOT CONTROLLED WITH YOUR NAUSEA MEDICATION  *UNUSUAL SHORTNESS OF BREATH  *UNUSUAL BRUISING OR BLEEDING  TENDERNESS IN MOUTH AND THROAT WITH OR WITHOUT PRESENCE OF ULCERS  *URINARY PROBLEMS  *BOWEL PROBLEMS  UNUSUAL RASH Items with * indicate a potential emergency and should be followed up as soon as possible.  Feel free to call the clinic you have any questions or concerns. The clinic phone number is [phone].   Influenza Virus Vaccine injection (Fluarix) What is this medicine? INFLUENZA VIRUS VACCINE (in floo EN zuh VAHY ruhs vak SEEN) helps to reduce the risk of getting influenza also known as the flu. This medicine may be used for other purposes; ask your health care provider or pharmacist if you have questions. COMMON BRAND NAME(S): Fluarix, Fluzone What should I tell my health care provider before I take this medicine? They need to know if you have any of these conditions: -bleeding disorder like hemophilia -fever or infection -Guillain-Barre syndrome or other neurological problems -immune system problems -infection with the human immunodeficiency virus (HIV) or AIDS -low blood platelet counts -multiple sclerosis -an unusual or allergic reaction to influenza virus vaccine, eggs, chicken proteins, latex, gentamicin, other medicines, foods, dyes or preservatives -pregnant or trying to get pregnant -breast-feeding How should I use this  medicine? This vaccine is for injection into a muscle. It is given by a health care professional. A copy of Vaccine Information Statements will be given before each vaccination. Read this sheet carefully each time. The sheet may change frequently. Talk to your pediatrician regarding the use of this medicine in children. Special care may be needed. Overdosage: If you think you have taken too much of this medicine contact a poison control center or emergency room at once. NOTE: This medicine is only for you. Do not share this medicine with others. What if I miss a dose? This does not apply. What may interact with this medicine? -chemotherapy or radiation therapy -medicines that lower your immune system like etanercept, anakinra, infliximab, and adalimumab -medicines that treat or prevent blood clots like warfarin -phenytoin -steroid medicines like prednisone or cortisone -theophylline -vaccines This list may not describe all possible interactions. Give your health care provider a list of all the medicines, herbs, non-prescription drugs, or dietary supplements you use. Also tell them if you smoke, drink alcohol, or use illegal drugs. Some items may interact with your medicine. What should I watch for while using this medicine? Report any side effects that do not go away within 3 days to your doctor or health care professional. Call your health care provider if any unusual symptoms occur within 6 weeks of receiving this vaccine. You may still catch the flu, but the illness is not usually as bad. You cannot get the flu from the vaccine. The vaccine will not protect against colds or other illnesses that may cause fever. The vaccine is needed every year. What side effects may I notice from receiving this medicine? Side effects that you should  report to your doctor or health care professional as soon as possible: -allergic reactions like skin rash, itching or hives, swelling of the face, lips, or  tongue Side effects that usually do not require medical attention (report to your doctor or health care professional if they continue or are bothersome): -fever -headache -muscle aches and pains -pain, tenderness, redness, or swelling at site where injected -weak or tired This list may not describe all possible side effects. Call your doctor for medical advice about side effects. You may report side effects to FDA at 1-800-FDA-1088. Where should I keep my medicine? This vaccine is only given in a clinic, pharmacy, doctor's office, or other health care setting and will not be stored at home. NOTE: This sheet is a summary. It may not cover all possible information. If you have questions about this medicine, talk to your doctor, pharmacist, or health care provider.  2015, [employer] Standard. (2007-09-26 09:30:40)

## 2013-11-13 NOTE — Patient Instructions (Signed)

## 2013-11-13 NOTE — Patient Instructions (Signed)
Your recent restaging CT scan revealed relatively stable disease. There is an area of slightly enlarged and we will watch this closely on subsequent restaging CT scans You'll continue on treatment with immunotherapy with Nivolumab Notify us immediately if he developed diarrhea again

## 2013-11-14 ENCOUNTER — Telehealth: Payer: Self-pay | Admitting: Internal Medicine

## 2013-11-14 NOTE — Telephone Encounter (Signed)
Pt confirmed labs/ov per 09/02 POF, sent msg to add chemo and to [facility] to req option to who can pt on next visit due to no availability ok to sch w/LT or CB....gave pt AVS....KJ

## 2013-11-19 ENCOUNTER — Telehealth: Payer: Self-pay | Admitting: Dietician

## 2013-11-19 NOTE — Telephone Encounter (Signed)
Brief Outpatient Oncology Nutrition Note  Patient has been identified to be at risk on malnutrition screen.  Wt Readings from Last 10 Encounters:  11/13/13 196 lb 3.2 oz (88.996 kg)  10/30/13 199 lb 11.2 oz (90.583 kg)  10/16/13 202 lb (91.627 kg)  10/02/13 203 lb 4.8 oz (92.216 kg)  09/10/13 203 lb 11.2 oz (92.398 kg)  08/23/13 206 lb 9.6 oz (93.713 kg)  07/11/13 200 lb 9.6 oz (90.992 kg)  05/24/13 194 lb 12.8 oz (88.361 kg)  04/04/13 189 lb 4.8 oz (85.866 kg)  02/25/13 189 lb (85.73 kg)    Dx:  Stage IIIB/4 non-small cell lung cancer, squamous cell carcinoma dx in June 2012.  S/p concurrent chemoradiation, s/p 6 cycles of systemic chemo with carboplatin and gemcitabine followed by palliative radiation as well as 2 cycles of systemic chemo with docetaxel and Neulasta support.  Now being treated with Nivolumab.  Called patient due to weight loss.  Spoke to wife.  Patient last seen by [facility] RD in July 2014 and has gained weight from 175 lbs but with more recent weight loss due to nausea/vomiting and had been using anti emetics more frequently and coming in for IVF.  Wife states that this lasted about 2 weeks and this week his appetite has returned, he is eating, and feels much better.  He drinks Boost.  Will send handout on tips for nausea and vomiting along with the [place] [facility] contact information and coupons for Boost.  Antonieta Iba, RD, LDN

## 2013-11-21 ENCOUNTER — Emergency Department (HOSPITAL_COMMUNITY): Payer: Medicare Other

## 2013-11-21 ENCOUNTER — Inpatient Hospital Stay (HOSPITAL_COMMUNITY): Payer: Medicare Other

## 2013-11-21 ENCOUNTER — Encounter (HOSPITAL_COMMUNITY): Payer: Self-pay | Admitting: Emergency Medicine

## 2013-11-21 ENCOUNTER — Inpatient Hospital Stay (HOSPITAL_COMMUNITY)
Admission: EM | Admit: 2013-11-21 | Discharge: 2013-12-12 | DRG: 871 | Disposition: E | Payer: Medicare Other | Attending: Internal Medicine | Admitting: Internal Medicine

## 2013-11-21 DIAGNOSIS — A419 Sepsis, unspecified organism: Secondary | ICD-10-CM | POA: Diagnosis present

## 2013-11-21 DIAGNOSIS — J910 Malignant pleural effusion: Secondary | ICD-10-CM

## 2013-11-21 DIAGNOSIS — R9431 Abnormal electrocardiogram [ECG] [EKG]: Secondary | ICD-10-CM | POA: Diagnosis present

## 2013-11-21 DIAGNOSIS — J189 Pneumonia, unspecified organism: Secondary | ICD-10-CM | POA: Diagnosis present

## 2013-11-21 DIAGNOSIS — I214 Non-ST elevation (NSTEMI) myocardial infarction: Secondary | ICD-10-CM | POA: Diagnosis present

## 2013-11-21 DIAGNOSIS — T451X5A Adverse effect of antineoplastic and immunosuppressive drugs, initial encounter: Secondary | ICD-10-CM

## 2013-11-21 DIAGNOSIS — Z79899 Other long term (current) drug therapy: Secondary | ICD-10-CM

## 2013-11-21 DIAGNOSIS — J449 Chronic obstructive pulmonary disease, unspecified: Secondary | ICD-10-CM | POA: Diagnosis present

## 2013-11-21 DIAGNOSIS — H409 Unspecified glaucoma: Secondary | ICD-10-CM

## 2013-11-21 DIAGNOSIS — E785 Hyperlipidemia, unspecified: Secondary | ICD-10-CM | POA: Diagnosis present

## 2013-11-21 DIAGNOSIS — B49 Unspecified mycosis: Secondary | ICD-10-CM | POA: Diagnosis present

## 2013-11-21 DIAGNOSIS — I5032 Chronic diastolic (congestive) heart failure: Secondary | ICD-10-CM

## 2013-11-21 DIAGNOSIS — J9611 Chronic respiratory failure with hypoxia: Secondary | ICD-10-CM

## 2013-11-21 DIAGNOSIS — I10 Essential (primary) hypertension: Secondary | ICD-10-CM | POA: Diagnosis present

## 2013-11-21 DIAGNOSIS — E1169 Type 2 diabetes mellitus with other specified complication: Secondary | ICD-10-CM | POA: Diagnosis present

## 2013-11-21 DIAGNOSIS — R197 Diarrhea, unspecified: Secondary | ICD-10-CM | POA: Diagnosis present

## 2013-11-21 DIAGNOSIS — R5383 Other fatigue: Secondary | ICD-10-CM

## 2013-11-21 DIAGNOSIS — J150 Pneumonia due to Klebsiella pneumoniae: Secondary | ICD-10-CM | POA: Diagnosis present

## 2013-11-21 DIAGNOSIS — R51 Headache: Secondary | ICD-10-CM | POA: Diagnosis present

## 2013-11-21 DIAGNOSIS — R6521 Severe sepsis with septic shock: Secondary | ICD-10-CM

## 2013-11-21 DIAGNOSIS — R319 Hematuria, unspecified: Secondary | ICD-10-CM | POA: Diagnosis present

## 2013-11-21 DIAGNOSIS — J4489 Other specified chronic obstructive pulmonary disease: Secondary | ICD-10-CM

## 2013-11-21 DIAGNOSIS — E872 Acidosis, unspecified: Secondary | ICD-10-CM | POA: Diagnosis present

## 2013-11-21 DIAGNOSIS — I519 Heart disease, unspecified: Secondary | ICD-10-CM | POA: Diagnosis present

## 2013-11-21 DIAGNOSIS — E119 Type 2 diabetes mellitus without complications: Secondary | ICD-10-CM

## 2013-11-21 DIAGNOSIS — C3490 Malignant neoplasm of unspecified part of unspecified bronchus or lung: Secondary | ICD-10-CM | POA: Diagnosis present

## 2013-11-21 DIAGNOSIS — Z923 Personal history of irradiation: Secondary | ICD-10-CM | POA: Diagnosis not present

## 2013-11-21 DIAGNOSIS — J90 Pleural effusion, not elsewhere classified: Secondary | ICD-10-CM | POA: Diagnosis present

## 2013-11-21 DIAGNOSIS — J9612 Chronic respiratory failure with hypercapnia: Secondary | ICD-10-CM

## 2013-11-21 DIAGNOSIS — Z66 Do not resuscitate: Secondary | ICD-10-CM | POA: Diagnosis present

## 2013-11-21 DIAGNOSIS — R5381 Other malaise: Secondary | ICD-10-CM

## 2013-11-21 DIAGNOSIS — D6481 Anemia due to antineoplastic chemotherapy: Secondary | ICD-10-CM | POA: Diagnosis present

## 2013-11-21 DIAGNOSIS — E7800 Pure hypercholesterolemia, unspecified: Secondary | ICD-10-CM | POA: Diagnosis present

## 2013-11-21 DIAGNOSIS — G9340 Encephalopathy, unspecified: Secondary | ICD-10-CM | POA: Diagnosis present

## 2013-11-21 DIAGNOSIS — Z87891 Personal history of nicotine dependence: Secondary | ICD-10-CM | POA: Diagnosis not present

## 2013-11-21 DIAGNOSIS — Z9221 Personal history of antineoplastic chemotherapy: Secondary | ICD-10-CM

## 2013-11-21 DIAGNOSIS — J9600 Acute respiratory failure, unspecified whether with hypoxia or hypercapnia: Secondary | ICD-10-CM | POA: Diagnosis present

## 2013-11-21 DIAGNOSIS — C3432 Malignant neoplasm of lower lobe, left bronchus or lung: Secondary | ICD-10-CM

## 2013-11-21 DIAGNOSIS — R4182 Altered mental status, unspecified: Secondary | ICD-10-CM

## 2013-11-21 DIAGNOSIS — R7881 Bacteremia: Secondary | ICD-10-CM | POA: Diagnosis present

## 2013-11-21 DIAGNOSIS — R6520 Severe sepsis without septic shock: Secondary | ICD-10-CM

## 2013-11-21 LAB — I-STAT CHEM 8, ED
BUN: 11 mg/dL (ref 6–23)
Calcium, Ion: 1 mmol/L — ABNORMAL LOW (ref 1.13–1.30)
Chloride: 100 mEq/L (ref 96–112)
Creatinine, Ser: 1.2 mg/dL (ref 0.50–1.35)
Glucose, Bld: 87 mg/dL (ref 70–99)
HCT: 30 % — ABNORMAL LOW (ref 39.0–52.0)
Hemoglobin: 10.2 g/dL — ABNORMAL LOW (ref 13.0–17.0)
Potassium: 3.5 mEq/L — ABNORMAL LOW (ref 3.7–5.3)
Sodium: 137 mEq/L (ref 137–147)
TCO2: 20 mmol/L (ref 0–100)

## 2013-11-21 LAB — URINALYSIS, ROUTINE W REFLEX MICROSCOPIC
Bilirubin Urine: NEGATIVE
Bilirubin Urine: NEGATIVE
Glucose, UA: NEGATIVE mg/dL
Glucose, UA: NEGATIVE mg/dL
Ketones, ur: NEGATIVE mg/dL
Ketones, ur: NEGATIVE mg/dL
Leukocytes, UA: NEGATIVE
Leukocytes, UA: NEGATIVE
Nitrite: NEGATIVE
Nitrite: NEGATIVE
PROTEIN: 30 mg/dL — AB
Protein, ur: NEGATIVE mg/dL
Specific Gravity, Urine: 1.021 (ref 1.005–1.030)
Specific Gravity, Urine: 1.015 (ref 1.005–1.030)
Urobilinogen, UA: 0.2 mg/dL (ref 0.0–1.0)
Urobilinogen, UA: 0.2 mg/dL (ref 0.0–1.0)
pH: 5 (ref 5.0–8.0)
pH: 5 (ref 5.0–8.0)

## 2013-11-21 LAB — URINE MICROSCOPIC-ADD ON

## 2013-11-21 LAB — COMPREHENSIVE METABOLIC PANEL
ALT: 14 U/L (ref 0–53)
AST: 20 U/L (ref 0–37)
Albumin: 2.2 g/dL — ABNORMAL LOW (ref 3.5–5.2)
Alkaline Phosphatase: 95 U/L (ref 39–117)
Anion gap: 23 — ABNORMAL HIGH (ref 5–15)
BUN: 12 mg/dL (ref 6–23)
CO2: 19 mEq/L (ref 19–32)
Calcium: 8.1 mg/dL — ABNORMAL LOW (ref 8.4–10.5)
Chloride: 97 mEq/L (ref 96–112)
Creatinine, Ser: 1.11 mg/dL (ref 0.50–1.35)
GFR calc Af Amer: 73 mL/min — ABNORMAL LOW (ref >90)
GFR calc non Af Amer: 63 mL/min — ABNORMAL LOW (ref >90)
Glucose, Bld: 83 mg/dL (ref 70–99)
Potassium: 3.7 mEq/L (ref 3.7–5.3)
Sodium: 139 mEq/L (ref 137–147)
Total Bilirubin: 0.7 mg/dL (ref 0.3–1.2)
Total Protein: 6.2 g/dL (ref 6.0–8.3)

## 2013-11-21 LAB — PROTIME-INR
INR: 1.62 — ABNORMAL HIGH (ref 0.00–1.49)
Prothrombin Time: 19.2 seconds — ABNORMAL HIGH (ref 11.6–15.2)

## 2013-11-21 LAB — DIFFERENTIAL
Basophils Absolute: 0 10*3/uL (ref 0.0–0.1)
Basophils Relative: 0 % (ref 0–1)
Eosinophils Absolute: 0.1 10*3/uL (ref 0.0–0.7)
Eosinophils Relative: 1 % (ref 0–5)
Lymphocytes Relative: 4 % — ABNORMAL LOW (ref 12–46)
Lymphs Abs: 0.3 10*3/uL — ABNORMAL LOW (ref 0.7–4.0)
Monocytes Absolute: 0.1 10*3/uL (ref 0.1–1.0)
Monocytes Relative: 1 % — ABNORMAL LOW (ref 3–12)
Neutro Abs: 6.5 10*3/uL (ref 1.7–7.7)
Neutrophils Relative %: 94 % — ABNORMAL HIGH (ref 43–77)
WBC Morphology: INCREASED

## 2013-11-21 LAB — RAPID URINE DRUG SCREEN, HOSP PERFORMED
Amphetamines: NOT DETECTED
Barbiturates: NOT DETECTED
Benzodiazepines: NOT DETECTED
Cocaine: NOT DETECTED
Opiates: NOT DETECTED
Tetrahydrocannabinol: NOT DETECTED

## 2013-11-21 LAB — I-STAT TROPONIN, ED: Troponin i, poc: 0.02 ng/mL (ref 0.00–0.08)

## 2013-11-21 LAB — BLOOD GAS, ARTERIAL
ACID-BASE DEFICIT: 4.6 mmol/L — AB (ref 0.0–2.0)
Bicarbonate: 19.5 mEq/L — ABNORMAL LOW (ref 20.0–24.0)
DRAWN BY: 225631
O2 CONTENT: 5 L/min
O2 Saturation: 95.2 %
PCO2 ART: 32.8 mmHg — AB (ref 35.0–45.0)
PH ART: 7.391 (ref 7.350–7.450)
PO2 ART: 79.6 mmHg — AB (ref 80.0–100.0)
Patient temperature: 98.6
TCO2: 20.5 mmol/L (ref 0–100)

## 2013-11-21 LAB — TROPONIN I
Troponin I: <0.3 ng/mL (ref <0.30)
Troponin I: 4.16 ng/mL (ref <0.30)

## 2013-11-21 LAB — MAGNESIUM: Magnesium: 1 mg/dL — ABNORMAL LOW (ref 1.5–2.5)

## 2013-11-21 LAB — LACTIC ACID, PLASMA
Lactic Acid, Venous: 7.6 mmol/L — ABNORMAL HIGH (ref 0.5–2.2)
Lactic Acid, Venous: 2.4 mmol/L — ABNORMAL HIGH (ref 0.5–2.2)

## 2013-11-21 LAB — CBC
HCT: 28.3 % — ABNORMAL LOW (ref 39.0–52.0)
Hemoglobin: 8.9 g/dL — ABNORMAL LOW (ref 13.0–17.0)
MCH: 28.7 pg (ref 26.0–34.0)
MCHC: 31.4 g/dL (ref 30.0–36.0)
MCV: 91.3 fL (ref 78.0–100.0)
Platelets: 184 10*3/uL (ref 150–400)
RBC: 3.1 MIL/uL — ABNORMAL LOW (ref 4.22–5.81)
RDW: 17.4 % — ABNORMAL HIGH (ref 11.5–15.5)
WBC: 7 10*3/uL (ref 4.0–10.5)

## 2013-11-21 LAB — GLUCOSE, CAPILLARY
GLUCOSE-CAPILLARY: 65 mg/dL — AB (ref 70–99)
Glucose-Capillary: 64 mg/dL — ABNORMAL LOW (ref 70–99)
Glucose-Capillary: 99 mg/dL (ref 70–99)
Glucose-Capillary: 96 mg/dL (ref 70–99)

## 2013-11-21 LAB — ETHANOL: Alcohol, Ethyl (B): <11 mg/dL (ref 0–11)

## 2013-11-21 LAB — INFLUENZA PANEL BY PCR (TYPE A & B)
H1N1 flu by pcr: NOT DETECTED
INFLAPCR: NEGATIVE
INFLBPCR: NEGATIVE

## 2013-11-21 LAB — CBG MONITORING, ED: Glucose-Capillary: 102 mg/dL — ABNORMAL HIGH (ref 70–99)

## 2013-11-21 LAB — CLOSTRIDIUM DIFFICILE BY PCR: Toxigenic C. Difficile by PCR: NEGATIVE

## 2013-11-21 LAB — APTT: aPTT: 87 seconds — ABNORMAL HIGH (ref 24–37)

## 2013-11-21 MED ORDER — LATANOPROST 0.005 % OP SOLN
1.0000 [drp] | Freq: Every day | OPHTHALMIC | Status: DC
  Administered 2013-11-21 – 2013-11-25 (×5): 1 [drp] via OPHTHALMIC
  Filled 2013-11-21: qty 2.5

## 2013-11-21 MED ORDER — ATORVASTATIN CALCIUM 20 MG PO TABS
20.0000 mg | ORAL_TABLET | Freq: Every day | ORAL | Status: DC
  Administered 2013-11-22: 20 mg via ORAL
  Filled 2013-11-21 (×4): qty 1

## 2013-11-21 MED ORDER — ASPIRIN 325 MG PO TABS: 325.0000 mg | ORAL_TABLET | Freq: Every day | ORAL | Status: DC

## 2013-11-21 MED ORDER — TRAMADOL HCL 50 MG PO TABS: 50.0000 mg | ORAL_TABLET | Freq: Four times a day (QID) | ORAL | Status: DC | PRN

## 2013-11-21 MED ORDER — ENOXAPARIN SODIUM 40 MG/0.4ML ~~LOC~~ SOLN
40.0000 mg | SUBCUTANEOUS | Status: DC
  Administered 2013-11-21 – 2013-11-25 (×5): 40 mg via SUBCUTANEOUS
  Filled 2013-11-21 (×6): qty 0.4

## 2013-11-21 MED ORDER — SODIUM CHLORIDE 0.9 % IV BOLUS (SEPSIS)
500.0000 mL | Freq: Once | INTRAVENOUS | Status: AC
  Administered 2013-11-21: 500 mL via INTRAVENOUS

## 2013-11-21 MED ORDER — IOHEXOL 350 MG/ML SOLN
80.0000 mL | Freq: Once | INTRAVENOUS | Status: AC | PRN
  Administered 2013-11-21: 80 mL via INTRAVENOUS

## 2013-11-21 MED ORDER — IPRATROPIUM-ALBUTEROL 0.5-2.5 (3) MG/3ML IN SOLN
3.0000 mL | Freq: Four times a day (QID) | RESPIRATORY_TRACT | Status: DC
  Administered 2013-11-21 – 2013-11-25 (×16): 3 mL via RESPIRATORY_TRACT
  Filled 2013-11-21 (×19): qty 3

## 2013-11-21 MED ORDER — SODIUM CHLORIDE 0.9 % IV SOLN: INTRAVENOUS | Status: DC

## 2013-11-21 MED ORDER — CETYLPYRIDINIUM CHLORIDE 0.05 % MT LIQD
7.0000 mL | Freq: Two times a day (BID) | OROMUCOSAL | Status: DC
  Administered 2013-11-21 – 2013-11-25 (×8): 7 mL via OROMUCOSAL

## 2013-11-21 MED ORDER — ONDANSETRON HCL 4 MG/2ML IJ SOLN
4.0000 mg | Freq: Once | INTRAMUSCULAR | Status: AC
  Administered 2013-11-21: 4 mg via INTRAVENOUS
  Filled 2013-11-21: qty 2

## 2013-11-21 MED ORDER — SODIUM CHLORIDE 0.9 % IV SOLN
INTRAVENOUS | Status: DC
  Administered 2013-11-21 – 2013-11-24 (×3): via INTRAVENOUS

## 2013-11-21 MED ORDER — TIOTROPIUM BROMIDE MONOHYDRATE 18 MCG IN CAPS
18.0000 ug | ORAL_CAPSULE | Freq: Every day | RESPIRATORY_TRACT | Status: DC
  Filled 2013-11-21: qty 5

## 2013-11-21 MED ORDER — VANCOMYCIN HCL 10 G IV SOLR
1750.0000 mg | Freq: Once | INTRAVENOUS | Status: AC
  Administered 2013-11-21: 1750 mg via INTRAVENOUS
  Filled 2013-11-21: qty 1750

## 2013-11-21 MED ORDER — ALBUTEROL SULFATE (2.5 MG/3ML) 0.083% IN NEBU: 2.5000 mg | INHALATION_SOLUTION | RESPIRATORY_TRACT | Status: DC | PRN

## 2013-11-21 MED ORDER — DEXTROSE 50 % IV SOLN
INTRAVENOUS | Status: AC
  Filled 2013-11-21: qty 50

## 2013-11-21 MED ORDER — DEXTROSE 5 % IV SOLN
1.0000 g | Freq: Two times a day (BID) | INTRAVENOUS | Status: DC
  Administered 2013-11-21 – 2013-11-22 (×2): 1 g via INTRAVENOUS
  Filled 2013-11-21 (×5): qty 1

## 2013-11-21 MED ORDER — SODIUM CHLORIDE 0.9 % IJ SOLN
3.0000 mL | Freq: Two times a day (BID) | INTRAMUSCULAR | Status: DC
  Administered 2013-11-21 – 2013-11-23 (×4): 3 mL via INTRAVENOUS

## 2013-11-21 MED ORDER — VANCOMYCIN HCL 1000 MG IV SOLR: 20.0000 mg/kg | Freq: Once | INTRAVENOUS | Status: DC

## 2013-11-21 MED ORDER — BOOST PLUS PO LIQD
237.0000 mL | Freq: Two times a day (BID) | ORAL | Status: DC | PRN
  Filled 2013-11-21: qty 237

## 2013-11-21 MED ORDER — VANCOMYCIN HCL IN DEXTROSE 750-5 MG/150ML-% IV SOLN
750.0000 mg | Freq: Two times a day (BID) | INTRAVENOUS | Status: DC
  Administered 2013-11-22: 750 mg via INTRAVENOUS
  Filled 2013-11-21 (×3): qty 150

## 2013-11-21 MED ORDER — ALBUTEROL SULFATE (2.5 MG/3ML) 0.083% IN NEBU
2.5000 mg | INHALATION_SOLUTION | RESPIRATORY_TRACT | Status: DC | PRN
  Administered 2013-11-23: 2.5 mg via RESPIRATORY_TRACT
  Filled 2013-11-21: qty 3

## 2013-11-21 MED ORDER — SODIUM CHLORIDE 0.9 % IV BOLUS (SEPSIS)
3000.0000 mL | Freq: Once | INTRAVENOUS | Status: AC
  Administered 2013-11-21: 3000 mL via INTRAVENOUS

## 2013-11-21 MED ORDER — ACETAMINOPHEN 325 MG PO TABS
650.0000 mg | ORAL_TABLET | Freq: Once | ORAL | Status: AC
  Administered 2013-11-21: 650 mg via ORAL
  Filled 2013-11-21: qty 2

## 2013-11-21 MED ORDER — ALBUTEROL SULFATE (5 MG/ML) 0.5% IN NEBU: 2.5000 mg | INHALATION_SOLUTION | RESPIRATORY_TRACT | Status: DC | PRN

## 2013-11-21 MED ORDER — DEXTROSE 50 % IV SOLN
25.0000 mL | Freq: Once | INTRAVENOUS | Status: AC | PRN
  Administered 2013-11-21: 25 mL via INTRAVENOUS

## 2013-11-21 MED ORDER — ACETAMINOPHEN 500 MG PO TABS: 500.0000 mg | ORAL_TABLET | Freq: Four times a day (QID) | ORAL | Status: DC | PRN

## 2013-11-21 MED ORDER — ACETAMINOPHEN 325 MG PO TABS
325.0000 mg | ORAL_TABLET | Freq: Once | ORAL | Status: AC
  Administered 2013-11-21: 325 mg via ORAL
  Filled 2013-11-21: qty 1

## 2013-11-21 MED ORDER — ONDANSETRON HCL 4 MG/2ML IJ SOLN
4.0000 mg | Freq: Four times a day (QID) | INTRAMUSCULAR | Status: DC | PRN
  Administered 2013-11-22 – 2013-11-24 (×6): 4 mg via INTRAVENOUS
  Filled 2013-11-21 (×6): qty 2

## 2013-11-21 MED ORDER — ASPIRIN 325 MG PO TABS
325.0000 mg | ORAL_TABLET | Freq: Every day | ORAL | Status: DC
  Administered 2013-11-21 – 2013-11-25 (×4): 325 mg via ORAL
  Filled 2013-11-21 (×6): qty 1

## 2013-11-21 MED ORDER — DEXTROSE 5 % IV SOLN
30.0000 ug/min | INTRAVENOUS | Status: DC
  Administered 2013-11-21: 30 ug/min via INTRAVENOUS
  Administered 2013-11-22: 160 ug/min via INTRAVENOUS
  Filled 2013-11-21 (×5): qty 1

## 2013-11-21 NOTE — ED Notes (Signed)
Attempted to stick for 2nd culture. Unable to obtain. Phlebotomy called. Antibiotics started.

## 2013-11-21 NOTE — ED Provider Notes (Signed)
CSN: [id number]     Arrival date & time 11/12/2013  1239 History   First MD Initiated Contact with Patient 11/18/2013 1245     Chief Complaint  Patient presents with  . Code Stroke     (Consider location/radiation/quality/duration/timing/severity/associated sxs/prior Treatment) HPI  78 year old male brought in by EMS as a possible code stroke. EMS initially called to home after patient had a syncopal event. .Family felt he might be hypoglycemic and gave him teaspoons of sugar. Prior this was complaining to wife of feeling generally fatigued and felt chilled. On EMS arrival patient was awaken and having a conversation with him. In acute change in mental status while in route. They diverted from the initial plan to go to [facility] and came to come because of this. He was noted to become increasingly tachycardic increasingly unresponsive and seemed weak on right side. Blood sugar just before arrival to the ED or was in the 130s. Patient did not specifically endorse any complaints just prior to this change.  Past Medical History  Diagnosis Date  . Diabetes mellitus   . HTN (hypertension)   . High cholesterol   . Hx of radiation therapy 09/22/10 - 11/05/10    LLL lung  . History of chemotherapy   . History of radiation therapy 04/24/12-05/15/12    lllung 35Gy/14dx  . Lung cancer 2012    recurrence 12/2011   Past Surgical History  Procedure Laterality Date  . Tonsillectomy    . Gastrostomy tube placement  2011    has been removed  . Portacath placement  08/2010   Family History  Problem Relation Age of Onset  . Heart disease Mother   . Heart disease Father   . Breast cancer Paternal Aunt    History  Substance Use Topics  . Smoking status: Former Smoker -- 4.00 packs/day for 34 years    Types: Cigarettes    Quit date: 03/15/1987  . Smokeless tobacco: Not on file  . Alcohol Use: No    Review of Systems  Level 5 caveat because pt is unresponsive.   Allergies  Codeine and  Penicillins  Home Medications   Prior to Admission medications   Medication Sig Start Date End Date Taking? Authorizing Provider  acetaminophen (TYLENOL) 500 MG tablet Take 500 mg by mouth every 6 (six) hours as needed.    Historical Provider, MD  albuterol (PROVENTIL) (5 MG/ML) 0.5% nebulizer solution Take 0.5 mLs (2.5 mg total) by nebulization every 4 (four) hours as needed for wheezing. 07/22/12   Annita Brod, MD  atorvastatin (LIPITOR) 20 MG tablet Take 20 mg by mouth daily.    Historical Provider, MD  glimepiride (AMARYL) 4 MG tablet Take 8 mg by mouth daily.     Historical Provider, MD  lactose free nutrition (BOOST PLUS) LIQD Take 237 mLs by mouth 2 (two) times daily as needed. 07/22/12   Annita Brod, MD  latanoprost (XALATAN) 0.005 % ophthalmic solution Place 1 drop into both eyes at bedtime.     Historical Provider, MD  magnesium oxide (MAG-OX) 400 (241.3 MG) MG tablet Take 1 tablet (400 mg total) by mouth daily. 10/02/13   Adrena E Johnson, PA-C  ondansetron (ZOFRAN) 8 MG tablet TAKE ONE TABLET BY MOUTH EVERY 8 HOURS AS NEEDED FOR NAUSEA AND VOMITING 11/05/13   Curt Bears, MD  pioglitazone (ACTOS) 30 MG tablet Take 30 mg by mouth daily.  12/31/11   Historical Provider, MD  prochlorperazine (COMPAZINE) 10 MG tablet Take 1  tablet (10 mg total) by mouth every 6 (six) hours as needed for nausea or vomiting. 10/02/13   Carlton Adam, PA-C  tiotropium (SPIRIVA HANDIHALER) 18 MCG inhalation capsule Place 1 capsule (18 mcg total) into inhaler and inhale daily. 02/25/13   Elsie Stain, MD  traMADol (ULTRAM) 50 MG tablet Take 1 tablet (50 mg total) by mouth every 6 (six) hours as needed. 09/10/13   Curt Bears, MD   There were no vitals taken for this visit. Physical Exam  Nursing note and vitals reviewed. Constitutional: He appears well-developed and well-nourished. No distress.  HENT:  Head: Normocephalic and atraumatic.  Eyes: Conjunctivae are normal. Right eye  exhibits no discharge. Left eye exhibits no discharge.  Neck: Neck supple.  Cardiovascular: Normal heart sounds.  Exam reveals no gallop and no friction rub.   No murmur heard. Marked tachycardia. Port R chest.   Pulmonary/Chest: Effort normal and breath sounds normal. No respiratory distress.  Abdominal: Soft. He exhibits no distension. There is no tenderness.  Musculoskeletal: He exhibits no edema and no tenderness.  Neurological:  Moaning. Eye closed. Does open them to pain. R arm and leg flaccid. Not following commands, but has tone L side. No obvious facial droop.  Skin: Skin is warm and dry.  Psychiatric: He has a normal mood and affect. His behavior is normal. Thought content normal.    ED Course  Procedures (including critical care time)  CRITICAL CARE Performed by: Virgel Manifold  Total critical care time: 40 minutes  Critical care time was exclusive of separately billable procedures and treating other patients. Critical care was necessary to treat or prevent imminent or life-threatening deterioration. Critical care was time spent personally by me on the following activities: development of treatment plan with patient and/or surrogate as well as nursing, discussions with consultants, evaluation of patient's response to treatment, examination of patient, obtaining history from patient or surrogate, ordering and performing treatments and interventions, ordering and review of laboratory studies, ordering and review of radiographic studies, pulse oximetry and re-evaluation of patient's condition.  Labs Review Labs Reviewed  PROTIME-INR - Abnormal; Notable for the following:    Prothrombin Time 19.2 (*)    INR 1.62 (*)    All other components within normal limits  APTT - Abnormal; Notable for the following:    aPTT 87 (*)    All other components within normal limits  CBC - Abnormal; Notable for the following:    RBC 3.10 (*)    Hemoglobin 8.9 (*)    HCT 28.3 (*)    RDW 17.4  (*)    All other components within normal limits  DIFFERENTIAL - Abnormal; Notable for the following:    Neutrophils Relative % 94 (*)    Lymphocytes Relative 4 (*)    Monocytes Relative 1 (*)    Lymphs Abs 0.3 (*)    All other components within normal limits  COMPREHENSIVE METABOLIC PANEL - Abnormal; Notable for the following:    Calcium 8.1 (*)    Albumin 2.2 (*)    GFR calc non Af Amer 63 (*)    GFR calc Af Amer 73 (*)    Anion gap 23 (*)    All other components within normal limits  URINALYSIS, ROUTINE W REFLEX MICROSCOPIC - Abnormal; Notable for the following:    APPearance HAZY (*)    Hgb urine dipstick LARGE (*)    All other components within normal limits  LACTIC ACID, PLASMA - Abnormal; Notable for the  following:    Lactic Acid, Venous 7.6 (*)    All other components within normal limits  URINE MICROSCOPIC-ADD ON - Abnormal; Notable for the following:    Casts HYALINE CASTS (*)    All other components within normal limits  CBG MONITORING, ED - Abnormal; Notable for the following:    Glucose-Capillary 102 (*)    All other components within normal limits  I-STAT CHEM 8, ED - Abnormal; Notable for the following:    Potassium 3.5 (*)    Calcium, Ion 1.00 (*)    Hemoglobin 10.2 (*)    HCT 30.0 (*)    All other components within normal limits  CULTURE, BLOOD (ROUTINE X 2)  CULTURE, BLOOD (ROUTINE X 2)  CLOSTRIDIUM DIFFICILE BY PCR  ETHANOL  URINE RAPID DRUG SCREEN (HOSP PERFORMED)  TROPONIN I  I-STAT TROPOININ, ED  I-STAT TROPOININ, ED    Imaging Review Ct Head Wo Contrast  12/05/2013   CLINICAL DATA:  Code stroke, right sided weakness  EXAM: CT HEAD WITHOUT CONTRAST  TECHNIQUE: Contiguous axial images were obtained from the base of the skull through the vertex without intravenous contrast.  COMPARISON:  None.  FINDINGS: There is no evidence of mass effect, midline shift, or extra-axial fluid collections. There is no evidence of a space-occupying lesion or  intracranial hemorrhage. There is no evidence of a cortical-based area of acute infarction. There is generalized cerebral atrophy. There is periventricular white matter low attenuation likely secondary to microangiopathy.  The ventricles and sulci are appropriate for the patient's age. The basal cisterns are patent.  Visualized portions of the orbits are unremarkable. There is mucosal thickening involving the right maxillary sinus, right ethmoid sinus and right frontal sinus. There is a left mastoid effusion. Cerebrovascular atherosclerotic calcifications are noted.  The osseous structures are unremarkable.  IMPRESSION: 1. No acute intracranial pathology. 2. Left mastoid effusion. 3. Sinus disease as described above. These results were called by telephone at the time of interpretation on 11/15/2013 at 1:02 pm to Dr. Armida Sans, who verbally acknowledged these results.   Electronically Signed   By: Kathreen Devoid   On: 12/05/2013 13:04   Dg Chest Port 1 View  11/17/2013   CLINICAL DATA:  Hypoxia  EXAM: PORTABLE CHEST - 1 VIEW  COMPARISON:  CT chest 11/08/2013  FINDINGS: Moderate left pleural effusion. No right pleural effusion. Bilateral interstitial thickening. No pneumothorax. Stable cardiomediastinal silhouette. Right-sided Port-A-Cath in satisfactory position. Unremarkable osseous structures.  IMPRESSION: 1. Moderate left pleural effusion. 2. Bilateral interstitial thickening concerning for mild interstitial edema.   Electronically Signed   By: Kathreen Devoid   On: 12/11/2013 13:41     EKG Interpretation   Date/Time:  Thursday November 21 2013 12:56:35 EDT Ventricular Rate:  141 PR Interval:  137 QRS Duration: 99 QT Interval:  329 QTC Calculation: 504 R Axis:   98 Text Interpretation:  Sinus tachycardia? Right axis deviation Low voltage,  extremity and precordial leads Repolarization abnormality, prob rate  related Prolonged QT interval Confirmed by [place] ([id number]) on  12/02/2013 1:48:50 PM       MDM   Final diagnoses:  Severe sepsis  Community acquired pneumonia    78yM with decreased mental status. Initially felt might be possible stroke. CT head negative. Since coming back to ED became increasingly more awake to point of speaking/following commands/answering appropriately. No focal deficit. Clinical picture now more consistent with sepsis. Febrile, hypotensive, >20% bands. CXR with new moderate-large pleural effusion. Empirically covered  with vanc/zosyn. Abd soft and NT. Will CT to to eval for PE, but clinically less likely. Possibly encephalopathy with infection, hypotension, hypoxemia? BP improving with IVF.      Virgel Manifold, MD 11/20/2013 [phone]

## 2013-11-21 NOTE — H&P (Signed)
Internal Medicine Attending Admission Note Date: 11/13/2013  Patient name: [name]. Medical record number: [id number] Date of birth: 1935/08/07 Age: 78 y.o. Gender: male  I saw and evaluated the patient. I reviewed the resident's note and I agree with the resident's findings and plan as documented in the resident's note, with the following additional comments.  Chief Complaint(s): Unresponsive to family  History - key components related to admission: Patient is a 78 year old man with history of stage IIIB/IV non-small cell lung cancer, type 2 diabetes mellitus, COPD, hypertension, and hyperlipidemia, brought to the emergency department by EMS after being found unresponsive by his family.  Family reports that he had low blood sugars last night, and recent chills; otherwise he has had no recent acute complaints.  Patient received volume resuscitation with normal saline in the emergency department, and is currently alert and oriented.  His main complaint at present is shortness of breath and feeling generally weak.   Physical Exam - key components related to admission:  Filed Vitals:   11/18/2013 1800 11/13/2013 1802 11/24/2013 1821 11/17/2013 1939  BP:  100/58    Pulse:  136    Temp:  100.9 F (38.3 C) 97.8 F (36.6 C)   TempSrc:  Core (Comment) Oral   Resp:  24    Height: 5' 10.5" (1.791 m)     Weight:      SpO2:  98%  100%   General: Alert, oriented x3 Lungs: Decreased breath sounds left base Heart: Regular, tachycardic Abdomen: Bowel sounds present, soft, nontender Extremities: No edema   Lab results:   Basic Metabolic Panel:  Recent Labs  12/06/2013 1248 11/23/2013 1251  NA 139 137  K 3.7 3.5*  CL 97 100  CO2 19  --   GLUCOSE 83 87  BUN 12 11  CREATININE 1.11 1.20  CALCIUM 8.1*  --     Liver Function Tests:  Recent Labs  11/22/2013 1248  AST 20  ALT 14  ALKPHOS 95  BILITOT 0.7  PROT 6.2  ALBUMIN 2.2*    CBC:  Recent Labs  11/12/2013 1248  12/10/2013 1251  WBC 7.0  --   HGB 8.9* 10.2*  HCT 28.3* 30.0*  MCV 91.3  --   PLT 184  --     Recent Labs  11/14/2013 1248  NEUTROABS 6.5  LYMPHSABS 0.3*  MONOABS 0.1  EOSABS 0.1  BASOSABS 0.0    Cardiac Enzymes:  Recent Labs  11/23/2013 1249  TROPONINI <0.30    CBG:  Recent Labs  12/06/2013 1244  GLUCAP 102*    Coagulation:  Recent Labs  12/08/2013 1248  INR 1.62*    Urine Drug Screen: Drugs of Abuse     Component Value Date/Time   LABOPIA NONE DETECTED 12/11/2013 1410   COCAINSCRNUR NONE DETECTED 11/15/2013 1410   LABBENZ NONE DETECTED 11/25/2013 1410   AMPHETMU NONE DETECTED 11/19/2013 1410   THCU NONE DETECTED 11/19/2013 1410   LABBARB NONE DETECTED 11/23/2013 1410     Alcohol Level:  Recent Labs  11/13/2013 1248  ETH <11    Urinalysis    Component Value Date/Time   COLORURINE YELLOW 11/12/2013 1410   APPEARANCEUR HAZY* 12/02/2013 1410   LABSPEC 1.021 11/16/2013 1410   PHURINE 5.0 12/03/2013 1410   GLUCOSEU NEGATIVE 11/17/2013 1410   HGBUR LARGE* 11/22/2013 1410   BILIRUBINUR NEGATIVE 12/02/2013 1410   KETONESUR NEGATIVE 11/24/2013 1410   PROTEINUR NEGATIVE 12/08/2013 1410   UROBILINOGEN 0.2 12/04/2013 1410   NITRITE NEGATIVE  11/19/2013 1410   LEUKOCYTESUR NEGATIVE 11/19/2013 1410    Urine microscopic:  Recent Labs  11/20/2013 1410  EPIU RARE  WBCU 0-2  RBCU 21-50  LABCAST HYALINE CASTS*  OTHERU MUCOUS PRESENT     Imaging results:  Ct Head Wo Contrast  12/07/2013   CLINICAL DATA:  Code stroke, right sided weakness  EXAM: CT HEAD WITHOUT CONTRAST  TECHNIQUE: Contiguous axial images were obtained from the base of the skull through the vertex without intravenous contrast.  COMPARISON:  None.  FINDINGS: There is no evidence of mass effect, midline shift, or extra-axial fluid collections. There is no evidence of a space-occupying lesion or intracranial hemorrhage. There is no evidence of a cortical-based area of acute infarction. There is generalized  cerebral atrophy. There is periventricular white matter low attenuation likely secondary to microangiopathy.  The ventricles and sulci are appropriate for the patient's age. The basal cisterns are patent.  Visualized portions of the orbits are unremarkable. There is mucosal thickening involving the right maxillary sinus, right ethmoid sinus and right frontal sinus. There is a left mastoid effusion. Cerebrovascular atherosclerotic calcifications are noted.  The osseous structures are unremarkable.  IMPRESSION: 1. No acute intracranial pathology. 2. Left mastoid effusion. 3. Sinus disease as described above. These results were called by telephone at the time of interpretation on 12/05/2013 at 1:02 pm to Dr. Armida Sans, who verbally acknowledged these results.   Electronically Signed   By: Kathreen Devoid   On: 12/04/2013 13:04   Ct Angio Chest Pe W/cm &/or Wo Cm  11/17/2013   CLINICAL DATA:  Shortness of breath  EXAM: CT ANGIOGRAPHY CHEST WITH CONTRAST  TECHNIQUE: Multidetector CT imaging of the chest was performed using the standard protocol during bolus administration of intravenous contrast. Multiplanar CT image reconstructions and MIPs were obtained to evaluate the vascular anatomy.  CONTRAST:  19mL OMNIPAQUE IOHEXOL 350 MG/ML SOLN  COMPARISON:  11/08/2013  FINDINGS: Severe emphysematous changes are noted in the lungs bilaterally. The right lung shows very minimal dependent atelectasis. No sizable effusion is seen. An increasing left-sided pleural effusion is noted when compared with the prior exam. Persistent underlying mass lesion in the left lower lobe is noted with associated compressive atelectasis. Post findings are stable in appearance from the prior exam although the degree of pleural fluid has increased significantly.  Stable subcarinal lymphadenopathy is noted. The thoracic aorta shows mild atherosclerotic changes without aneurysmal dilatation. The pulmonary artery shows a normal branching pattern. Some  attenuation is noted in the left lower lobe related to the underlying mass lesion. No definitive pulmonary emboli are seen.  Scanning into the upper abdomen reveals no acute abnormality. No gross bony abnormality is seen.  Review of the MIP images confirms the above findings.  IMPRESSION: No evidence of pulmonary emboli.  Persistent changes in the left lung consistent with the patient's given clinical history. Significant increase in the degree of pleural effusion is noted and this is likely the etiology of the patient's hypoxia.   Electronically Signed   By: Inez Catalina M.D.   On: 11/24/2013 17:03   Dg Chest Port 1 View  12/03/2013   CLINICAL DATA:  Hypoxia  EXAM: PORTABLE CHEST - 1 VIEW  COMPARISON:  CT chest 11/08/2013  FINDINGS: Moderate left pleural effusion. No right pleural effusion. Bilateral interstitial thickening. No pneumothorax. Stable cardiomediastinal silhouette. Right-sided Port-A-Cath in satisfactory position. Unremarkable osseous structures.  IMPRESSION: 1. Moderate left pleural effusion. 2. Bilateral interstitial thickening concerning for mild interstitial edema.  Electronically Signed   By: Kathreen Devoid   On: 11/28/2013 13:41    Other results: EKG: Sinus tachycardia?; right axis deviation; low voltage, extremity and precordial leads; repolarization abnormality, prob rate related; prolonged QT interval  Assessment & Plan by Problem:  1.  Severe sepsis with septic shock.   Source is unclear, but a pulmonary source seems likely.  Patient was hypotensive in the emergency department but responded to normal saline volume resuscitation and now has stable blood pressure.  He remains tachycardic, and has elevated lactic acid consistent with severe sepsis.  Plans include empiric broad-spectrum IV antibiotics (vancomycin and cefepime); culture blood and urine; continue IV normal saline for blood pressure support, with IV pressors if needed; per discussion with patient and family, they desire  active treatments including fluid, antibiotics, and pressors if needed; pulmonary/critical-care consult.  2.  Acute respiratory failure.  Patient has a left pleural effusion that has increased in size; he has undergone thoracentesis in the past, and may benefit from therapeutic thoracentesis.  He currently has acceptable oxygen saturations on nasal cannula O2.  Plan is treatment of sepsis as above; supplement oxygen and follow saturations; pulmonary/critical care consult as above for management to include consideration of therapeutic thoracentesis.  Patient and family are agreeable to BiPAP if indicated for short-term management.  3.  Stage IIIB/for non-small cell lung cancer.  Patient is currently on immunotherapy with Nivolumab managed by his oncologist Dr. Julien Nordmann.  Plan as above is pulmonary critical care consult for possible drainage of malignant pleural effusion; notify Dr. Julien Nordmann of the patient's hospital admission.  4.  CODE STATUS.  Patient and family confirmed his desire for DO NOT RESUSCITATE CODE STATUS, but he does want to pursue medical therapies including antibiotics, IV fluids, pressors if needed, and other active medical treatments.  He also is agreeable to BiPAP if needed for short-term management.    5.  Other problems and plans as per the resident physician's note.

## 2013-11-21 NOTE — ED Notes (Signed)
Dr. Wilson Singer at the bedside.

## 2013-11-21 NOTE — Progress Notes (Signed)
Pt arrived from ED, ST, BP now stable. DNR meds only per Pt, family & DR. Flu sent. Will continue to monitor.

## 2013-11-21 NOTE — Consult Note (Signed)
Name: [name]. MRN: [id number] DOB: Oct 26, 1935    ADMISSION DATE:  11/17/2013 CONSULTATION DATE:  11/22/2013  REFERRING MD :  Marinda Elk PRIMARY SERVICE:  IMTS  CHIEF COMPLAINT:  AMS  BRIEF PATIENT DESCRIPTION: 78 yo male with stage IIIB/IV non-small cell lung cancer and GOLD C COPD ([facility] patient) presented to ED after syncopal episode at home. Initially thought to have possibly had a stroke, further workup revealed more of a infectious type picture. CXR noted new mod-large L pleural effusion. PCCM consulted for further eval.   SIGNIFICANT EVENTS / STUDIES:  9/10 CT head > No acute intracranial process, L mastoid effusion 9/10 CTA Chest > No PE, severe emphysematous changes bilat, large L effusion, Persistent mass LLL w/ associated ATX  LINES / TUBES: PIV  CULTURES: Resp Virus 9/10 >>> Sputum 9/10 >>> C-dif 9/10 > Neg Blood 9/10 >>>  ANTIBIOTICS: Cefepime 9/10 >>> Vancomycin 9/10 >>>  HISTORY OF PRESENT ILLNESS:  78 year old male with PMH as outlined below, which includes stage IIIB/IV non-small cell lung cancer, squamous cell carcinoma (2012) undergoing several past treatments as outlined in oncology note for 9/2, and current palliative treatment with Nivolumab every 2 weeks. First dose 09/18/2013, most recent 9/2. He also has GOLD C COPD and is a patient of [facility]. 9/10 he had syncopal episode at home which prompted EMS notification. Upon EMS arrival he was noted to have AMS and was slated for transfer to [facility] ED. He began to wake up en route and exhibited more stroke like symptoms (R weakness) and was then brought to [facility]. In ED he was evaluated and was hypoxic. He underwent CT head and chest. CT chest found Mod-large L pleural effusion which is thought to be the cause for his hypoxia. He was admitted under IMTS and PCCM was consulted for eval of hypoxia/effusion.  PAST MEDICAL HISTORY :  Past Medical History  Diagnosis Date  . Diabetes mellitus   . HTN (hypertension)   . High  cholesterol   . Hx of radiation therapy 09/22/10 - 11/05/10    LLL lung  . History of chemotherapy   . History of radiation therapy 04/24/12-05/15/12    lllung 35Gy/14dx  . Lung cancer 2012    recurrence 12/2011   Past Surgical History  Procedure Laterality Date  . Tonsillectomy    . Gastrostomy tube placement  2011    has been removed  . Portacath placement  08/2010   Prior to Admission medications   Medication Sig Start Date End Date Taking? Authorizing Provider  acetaminophen (TYLENOL) 500 MG tablet Take 500 mg by mouth every 6 (six) hours as needed for mild pain or headache.    Yes Historical Provider, MD  albuterol (PROVENTIL) (5 MG/ML) 0.5% nebulizer solution Take 0.5 mLs (2.5 mg total) by nebulization every 4 (four) hours as needed for wheezing. 07/22/12  Yes Annita Brod, MD  atorvastatin (LIPITOR) 20 MG tablet Take 20 mg by mouth daily.   Yes Historical Provider, MD  glimepiride (AMARYL) 4 MG tablet Take 8 mg by mouth daily.    Yes Historical Provider, MD  lactose free nutrition (BOOST PLUS) LIQD Take 237 mLs by mouth 2 (two) times daily as needed. 07/22/12  Yes Annita Brod, MD  latanoprost (XALATAN) 0.005 % ophthalmic solution Place 1 drop into both eyes at bedtime.    Yes Historical Provider, MD  loperamide (IMODIUM) 2 MG capsule Take 2 mg by mouth 4 (four) times daily as needed for diarrhea  or loose stools.   Yes Historical Provider, MD  magnesium oxide (MAG-OX) 400 (241.3 MG) MG tablet Take 1 tablet (400 mg total) by mouth daily. 10/02/13  Yes Adrena E Johnson, PA-C  ondansetron (ZOFRAN) 8 MG tablet Take 8 mg by mouth every 8 (eight) hours as needed for nausea or vomiting.   Yes Historical Provider, MD  pioglitazone (ACTOS) 30 MG tablet Take 30 mg by mouth daily.  12/31/11  Yes Historical Provider, MD  tiotropium (SPIRIVA HANDIHALER) 18 MCG inhalation capsule Place 1 capsule (18 mcg total) into inhaler and inhale daily. 02/25/13  Yes Elsie Stain, MD  traMADol (ULTRAM)  50 MG tablet Take 50 mg by mouth every 6 (six) hours as needed (pain).   Yes Historical Provider, MD   Allergies  Allergen Reactions  . Codeine Other (See Comments)    Made pt unrepsonsive  . Penicillins Other (See Comments)    unknown    FAMILY HISTORY:  Family History  Problem Relation Age of Onset  . Heart disease Mother   . Heart disease Father   . Breast cancer Paternal Aunt    SOCIAL HISTORY:  reports that he quit smoking about 26 years ago. His smoking use included Cigarettes. He has a 136 pack-year smoking history. He does not have any smokeless tobacco history on file. He reports that he does not drink alcohol or use illicit drugs.  REVIEW OF SYSTEMS:   Bolds are positive  Constitutional: weight loss, gain, night sweats, Fevers, chills, fatigue .  HEENT: headaches, Sore throat, sneezing, nasal congestion, post nasal drip, Difficulty swallowing, Tooth/dental problems, visual complaints visual changes, ear ache CV:  chest pain, radiates: ,Orthopnea, PND, swelling in lower extremities, dizziness, palpitations, syncope.  GI  heartburn, indigestion, abdominal pain, nausea, vomiting, diarrhea, change in bowel habits, loss of appetite, bloody stools.  Resp: cough, productive: , hemoptysis, dyspnea, chest pain, pleuritic.  Skin: rash or itching or icterus GU: dysuria, change in color of urine, urgency or frequency. flank pain, hematuria  MS: joint pain or swelling. decreased range of motion  Psych: change in mood or affect. depression or anxiety.  Neuro: difficulty with speech, weakness, numbness, ataxia    SUBJECTIVE:   VITAL SIGNS: Temp:  [97.8 F (36.6 C)-102.7 F (39.3 C)] 97.8 F (36.6 C) (09/10 1821) Pulse Rate:  [33-146] 136 (09/10 1802) Resp:  [20-30] 24 (09/10 1802) BP: (73-121)/(45-95) 100/58 mmHg (09/10 1802) SpO2:  [66 %-100 %] 98 % (09/10 1802) Weight:  [89 kg (196 lb 3.4 oz)] 89 kg (196 lb 3.4 oz) (09/10 1328)  PHYSICAL EXAMINATION: General:  Elderly  male in mild respiratory distress Neuro:  Alert, oriented x 3. Speech is slow and deliberate. Drowsy. HEENT:  [place]/AT, PERRL, No JVD noted Cardiovascular:  Tachy, regular Lungs:  Rhonchi/diminshed L base Abdomen:  Soft, non-tender, non-distended Musculoskeletal:  No acute deformity or ROM limitation Skin:  Intact, ecchymosis to BUE. Port-a-cath in place R anterior chest wall.    Recent Labs Lab 11/30/2013 1248 11/18/2013 1251  NA 139 137  K 3.7 3.5*  CL 97 100  CO2 19  --   BUN 12 11  CREATININE 1.11 1.20  GLUCOSE 83 87    Recent Labs Lab 11/28/2013 1248 11/25/2013 1251  HGB 8.9* 10.2*  HCT 28.3* 30.0*  WBC 7.0  --   PLT 184  --    Ct Head Wo Contrast  11/19/2013   CLINICAL DATA:  Code stroke, right sided weakness  EXAM: CT HEAD WITHOUT CONTRAST  TECHNIQUE: Contiguous axial images were obtained from the base of the skull through the vertex without intravenous contrast.  COMPARISON:  None.  FINDINGS: There is no evidence of mass effect, midline shift, or extra-axial fluid collections. There is no evidence of a space-occupying lesion or intracranial hemorrhage. There is no evidence of a cortical-based area of acute infarction. There is generalized cerebral atrophy. There is periventricular white matter low attenuation likely secondary to microangiopathy.  The ventricles and sulci are appropriate for the patient's age. The basal cisterns are patent.  Visualized portions of the orbits are unremarkable. There is mucosal thickening involving the right maxillary sinus, right ethmoid sinus and right frontal sinus. There is a left mastoid effusion. Cerebrovascular atherosclerotic calcifications are noted.  The osseous structures are unremarkable.  IMPRESSION: 1. No acute intracranial pathology. 2. Left mastoid effusion. 3. Sinus disease as described above. These results were called by telephone at the time of interpretation on 12/10/2013 at 1:02 pm to Dr. Armida Sans, who verbally acknowledged these results.    Electronically Signed   By: Kathreen Devoid   On: 11/18/2013 13:04   Ct Angio Chest Pe W/cm &/or Wo Cm  11/29/2013   CLINICAL DATA:  Shortness of breath  EXAM: CT ANGIOGRAPHY CHEST WITH CONTRAST  TECHNIQUE: Multidetector CT imaging of the chest was performed using the standard protocol during bolus administration of intravenous contrast. Multiplanar CT image reconstructions and MIPs were obtained to evaluate the vascular anatomy.  CONTRAST:  27mL OMNIPAQUE IOHEXOL 350 MG/ML SOLN  COMPARISON:  11/08/2013  FINDINGS: Severe emphysematous changes are noted in the lungs bilaterally. The right lung shows very minimal dependent atelectasis. No sizable effusion is seen. An increasing left-sided pleural effusion is noted when compared with the prior exam. Persistent underlying mass lesion in the left lower lobe is noted with associated compressive atelectasis. Post findings are stable in appearance from the prior exam although the degree of pleural fluid has increased significantly.  Stable subcarinal lymphadenopathy is noted. The thoracic aorta shows mild atherosclerotic changes without aneurysmal dilatation. The pulmonary artery shows a normal branching pattern. Some attenuation is noted in the left lower lobe related to the underlying mass lesion. No definitive pulmonary emboli are seen.  Scanning into the upper abdomen reveals no acute abnormality. No gross bony abnormality is seen.  Review of the MIP images confirms the above findings.  IMPRESSION: No evidence of pulmonary emboli.  Persistent changes in the left lung consistent with the patient's given clinical history. Significant increase in the degree of pleural effusion is noted and this is likely the etiology of the patient's hypoxia.   Electronically Signed   By: Inez Catalina M.D.   On: 11/27/2013 17:03   Dg Chest Port 1 View  12/05/2013   CLINICAL DATA:  Hypoxia  EXAM: PORTABLE CHEST - 1 VIEW  COMPARISON:  CT chest 11/08/2013  FINDINGS: Moderate left pleural  effusion. No right pleural effusion. Bilateral interstitial thickening. No pneumothorax. Stable cardiomediastinal silhouette. Right-sided Port-A-Cath in satisfactory position. Unremarkable osseous structures.  IMPRESSION: 1. Moderate left pleural effusion. 2. Bilateral interstitial thickening concerning for mild interstitial edema.   Electronically Signed   By: Kathreen Devoid   On: 12/10/2013 13:41    ASSESSMENT / PLAN:  PULMONARY A: Acute hypoxic respiratory failure in setting of L effusion and post obstructive ATX (mass) vs HCAP Large left pleural effusion- note last thora from 6/30 demonstrated reactive mesothelial cells and chronic inflammation. NSCLC s/p radiation and undergoing chemo ? Post obstructive pneumonitis H/o GOLD  C - COPD without evidence of acute exacerbation.  DNI Status P:   Supplemental O2 as needed to maintain SpO2 greater than 92%. Consider repeat diagnostic and therapeutic thora in AM If thora demonstrates malignant effusion, would consider Pleur-X. Continue abx as per ID section. BD regimen adjusted. CXR in AM.  CARDIOVASCULAR Right chest Port A Cath >>> A:  Septic Shock Hx HTN Hx HLD DNR Status P:  Transfer to ICU. Start neosynephrine. Goal MAP > 65. Repeat lactate.  RENAL A:  AG metabolic acidosis - lactate P:   NS @ 100. BMP in AM.  GASTROINTESTINAL A:   No acute issues P:   No intervention required.  HEMATOLOGIC A:  VTE prophylaxis P:  Lovenox / SCD's. CBC in AM.  INFECTIOUS A:   Septic Shock - in setting of large left pleural effusion with possible post obstructive HCAP P:   BCx2 9/10 UCx 9/10 RVP 9/10 Sputum 9/10 Abx: Vanc, start date 9/10, day 1/x. Abx: Cefepime, start date 9/10, day 1/x.  ENDOCRINE A:  DM P:   Monitor CBG's. SSI for CBG > 180. Hold outpatient glimepiride, pioglitazone.  NEUROLOGIC A:   Acute encephalopathy - resolving. P:   Monitor.  TODAY'S SUMMARY: 78 y.o. M with hx of NSCLC admitted with  septic shock.  Has large left pleural effusion with post obstructive ATX vs HCAP.  Empiric abx, transfer to ICU for neosynephrine gtt.  May need thora in AM 9/11. NOTE, PT IS DNR / DNI STATUS.   [place], [place] Pgr: [phone]  or [phone]  Attending note - Septic shock , presumed LLL post obstructive pneumonia vs severe dehydration . Discussed DNR status in detail with pt & family - he is agreeable to pressors & reasonable interventions such as thoracentesis. Appears volume resuscitated clinically, Will start neo gtt via portacath & use lactate clearance for perfusion. Transfer to ICU, DNR otherwise. Consider thoracentesis if increased dyspnea, although suspect that LLL may not expand fully.  I have personally obtained a history, examined the patient, evaluated laboratory and imaging results, formulated the assessment and plan and placed orders. CRITICAL CARE: The patient is critically ill with multiple organ systems failure and requires high complexity decision making for assessment and support, frequent evaluation and titration of therapies, application of advanced monitoring technologies and extensive interpretation of multiple databases. Critical Care Time devoted to patient care services described in this note is 45 minutes.   Rigoberto Noel MD  Reflects care rendered around  11/28/2013, 7:44 PM

## 2013-11-21 NOTE — Progress Notes (Signed)
Patient transferred from 3S room 5 to [place] room 5, after report was called to receiving RN, [place].  All questions answered.  Patient's chart, meds, and personal belongings all sent with patient.  Patient transferred via bed on tele monitor and 6 L [place].  Wife present during transfer.  Safety measures maintained.    Doran Clay, RN

## 2013-11-21 NOTE — Progress Notes (Addendum)
ANTIBIOTIC CONSULT NOTE - INITIAL  Pharmacy Consult for Cefepime  Indication: Sepsis   Allergies  Allergen Reactions  . Codeine Other (See Comments)    Made pt unrepsonsive  . Penicillins     Patient Measurements: Height: 5' 10.08" (178 cm) Weight: 196 lb 3.4 oz (89 kg) IBW/kg (Calculated) : 73.18 Adjusted Body Weight: n/a   Vital Signs: Temp: 100.4 F (38 C) (09/10 1330) Temp src: Temporal (09/10 1322) BP: 105/74 mmHg (09/10 1345) Pulse Rate: 34 (09/10 1330) Intake/Output from previous day:   Intake/Output from this shift:    Labs:  Recent Labs  11/23/2013 1248 11/14/2013 1251  WBC PENDING  --   HGB 8.9* 10.2*  PLT PENDING  --   CREATININE  --  1.20   Estimated Creatinine Clearance: 58.9 ml/min (by C-G formula based on Cr of 1.2). No results found for this basename: VANCOTROUGH, VANCOPEAK, VANCORANDOM, GENTTROUGH, GENTPEAK, GENTRANDOM, TOBRATROUGH, TOBRAPEAK, TOBRARND, AMIKACINPEAK, AMIKACINTROU, AMIKACIN,  in the last 72 hours   Microbiology: No results found for this or any previous visit (from the past 720 hour(s)).  Medical History: Past Medical History  Diagnosis Date  . Diabetes mellitus   . HTN (hypertension)   . High cholesterol   . Hx of radiation therapy 09/22/10 - 11/05/10    LLL lung  . History of chemotherapy   . History of radiation therapy 04/24/12-05/15/12    lllung 35Gy/14dx  . Lung cancer 2012    recurrence 12/2011    Medications:   (Not in a hospital admission)   Assessment: 28 YOM brought to the ED as possible code stroke which was assessed to by hypoglycemia on arrival. Pharmacy consulted to start patient on empiric antibiotics for r/o sepsis. Tmax 100.4. HR labile. BP was low on arrival. WBC wnl. Pt also has a dose of Vancomycin ordered in the ED.   Cultures: 9/10 Blood Cx x2>>   Goal of Therapy:  Resolution of infection   Plan:  - Start Cefepime 1 gm IV Q 12 hours  - Monitor CBC, renal fx, cultures and patient's clinical  progress - F/u if Vancomycin is to be resumed on admission    Albertina Parr, PharmD.  Clinical Pharmacist Pager [phone]    ============================================   Addendum: - continue vancomycin for possible post-obstructive PNA - received vanc 1750mg  IV x 1 around 1500 - SCr 1 >> 1.2   Goal of Therapy: Vanc trough:  15-20 mcg/mL   Plan: - Vanc 750mg  IV Q12H, start tomorrow - Monitor renal fxn, vanc trough as indicated    Shann Merrick D. Mina Marble, PharmD, BCPS Pager:  [phone] 12/11/2013, 6:17 PM

## 2013-11-21 NOTE — ED Notes (Signed)
Pt returned to Trauma B with Dr. Aram Beecham and Stroke Team

## 2013-11-21 NOTE — ED Notes (Signed)
Admitting MD at the bedside.  

## 2013-11-21 NOTE — ED Notes (Signed)
Pt returned from CT with this RN.  

## 2013-11-21 NOTE — Progress Notes (Signed)
CRITICAL VALUE ALERT  Critical value received:  CBG 67   Date of notification:  11/22/2013   Time of notification:  2140  Critical value read back:Yes.    Nurse who received alert:  Carver Fila  MD notified (1st page):  n/a  Time of first page:  n/a  MD notified (2nd page):  Time of second page:  Responding MD:  n/a Time MD responded:  n/a

## 2013-11-21 NOTE — ED Notes (Signed)
Pt lying with arm with BP cuff on it over the side rail. BP cuff switched to left arm. 80/48. MD aware and VO received for another liter of fluids.

## 2013-11-21 NOTE — Code Documentation (Signed)
78yo male arriving to [facility] at 3 via [place].  EMS reports that they were called to the patient's home related to a syncopal episode.  The patient was home with his wife when he became shaky.  His wife checked his blood sugar and found it to be 51.  She treated his blood sugar and he got up to go to the BR, he has been having diarrhea, and then had a syncopal episode while on the toilet.  The patient has lung cancer and is undergoing chemotherapy.  He has a h/o high cholesterol, COPD, DM, emphysema and CHF.  EMS was on their way to [facility] when the patient stopped communicating and following commands and was assessed to have right sided paralysis.  Patient in sinus tachycardia with HR in the 130s for EMS.  Initially hypotensive, given IVFs and BP improving per EMS.  Patient's airway cleared at the bridge on arrival by Dr. Wilson Singer.  Patient taken to CT.  Patient back to Trauma B.  Patient tachycardic and given IVFs for hypotension.  Dr. Armida Sans at the bedside.  Patient initially not responding with left gaze.  Patient now responding to questions, full gaze and moving all extremities, but experiencing tremors.  Code stroke canceled by Dr. Armida Sans at [phone].  No acute stroke treatment at this time.  Bedside handoff with ED RN Hayley.

## 2013-11-21 NOTE — ED Notes (Signed)
Pt switched from NRB to a [place] at 5 liters. Pt is on 2.5 liters at home.

## 2013-11-21 NOTE — Progress Notes (Addendum)
eLink Physician-Brief Progress Note Patient Name: [name]. DOB: 12-10-1935 MRN: [id number]   Date of Service  11/20/2013  HPI/Events of Note    Recent Labs Lab 12/05/2013 1249 12/03/2013 2135  TROPONINI <0.30 4.16*    Recent Labs Lab 11/23/2013 1248 11/13/2013 1251  HGB 8.9* 10.2*   EKG:  Poor quality, non-specific ST-T changes  Demand ischemia vs NSTEMI  eICU Interventions  ASA Trend Troponin Already on Lipitor BB / ACEI contraindicated - shock Would defer Heparin gtt for now     Intervention Category Intermediate Interventions: Other:  Doree Fudge 12/09/2013, 10:39 PM

## 2013-11-21 NOTE — Consult Note (Signed)
Referring Physician: ED    Chief Complaint: code stroke, unresponsiveness, right sided weakness  HPI:                                                                                                                                         [name]. is an 78 y.o. male with a past medical history significant for HTN, DM, hypercholesterolemia, lung cancer receiving chemotherapy and s/p XRT, COPD, brought in via EMS as a code stroke due to acute onset of the above stated symptoms. Wife is at the bedside and stated that he was sitting in a chair, started getting shaky, she checked her blood sugar and it was 51. Then, he said that he needed to go to the BR, had diarrhea, and became less responsive and shivering. Wife stated that before becoming poorly responsive he was able to talk normally and move his arms and legs. EMS was called and in route to the ED he developed right sided weakness. While in the ED, had SBP in the low 90's and upper 80's.  Currently, he is able to answer questions and follows simple commands. CT brain showed no acute intracranial abnormality. Date last known well: 11/13/2013 Time last known well: 12:25 pm tPA Given: no, hypoglycemia as stroke mimic  NIHSS:    Past Medical History  Diagnosis Date  . Diabetes mellitus   . HTN (hypertension)   . High cholesterol   . Hx of radiation therapy 09/22/10 - 11/05/10    LLL lung  . History of chemotherapy   . History of radiation therapy 04/24/12-05/15/12    lllung 35Gy/14dx  . Lung cancer 2012    recurrence 12/2011    Past Surgical History  Procedure Laterality Date  . Tonsillectomy    . Gastrostomy tube placement  2011    has been removed  . Portacath placement  08/2010    Family History  Problem Relation Age of Onset  . Heart disease Mother   . Heart disease Father   . Breast cancer Paternal Aunt    Social History:  reports that he quit smoking about 26 years ago. His smoking use included Cigarettes. He has a 136  pack-year smoking history. He does not have any smokeless tobacco history on file. He reports that he does not drink alcohol or use illicit drugs.  Allergies:  Allergies  Allergen Reactions  . Codeine Other (See Comments)    Made pt unrepsonsive  . Penicillins     Medications:  I have reviewed the patient's current medications.  ROS: unable to obtain due to mental status                                                                                                                                     History obtained from chart review and wife  Physical exam: patient in moderate respiratory distress. Blood pressure 91/64, temperature 99.3 F (37.4 C), temperature source Oral, resp. rate 26, SpO2 100.00%. Head: normocephalic. Neck: supple, no bruits, no JVD. Cardiac: no murmurs. Lungs: clear. Abdomen: soft, no tender, no mass. Extremities: no edema. Neurologic Examination:                                                                                                      General: Mental Status: Awake and able to follow simple step commands without difficulty. Cranial Nerves: II: Discs flat bilaterally; Visual fields grossly normal, pupils equal, round, reactive to light and accommodation III,IV, VI: ptosis not present, extra-ocular motions intact bilaterally V,VII: smile symmetric, facial light touch sensation normal bilaterally VIII: hearing normal bilaterally IX,X: gag reflex present XI: bilateral shoulder shrug XII: midline tongue extension without atrophy or fasciculations Motor: Right : Upper extremity   5/5    Left:     Upper extremity   5/5  Lower extremity   5/5     Lower extremity   5/5 Tone and bulk:normal tone throughout; no atrophy noted Sensory: Pinprick and light touch intact throughout, bilaterally Deep Tendon Reflexes:  1+ all  over Plantars: Right: downgoing   Left: downgoing Cerebellar: Unable to test Gait:  Unable to test    Results for orders placed during the hospital encounter of 11/30/2013 (from the past 48 hour(s))  CBG MONITORING, ED     Status: Abnormal   Collection Time    11/22/2013 12:44 PM      Result Value Ref Range   Glucose-Capillary 102 (*) 70 - 99 mg/dL  I-STAT TROPOININ, ED     Status: None   Collection Time    11/25/2013 12:49 PM      Result Value Ref Range   Troponin i, poc 0.02  0.00 - 0.08 ng/mL   Comment 3            Comment: Due to the release kinetics of cTnI,     a negative result within the first hours     of the onset of symptoms does not rule out     myocardial infarction with certainty.  If myocardial infarction is still suspected,     repeat the test at appropriate intervals.  I-STAT CHEM 8, ED     Status: Abnormal   Collection Time    11/17/2013 12:51 PM      Result Value Ref Range   Sodium 137  137 - 147 mEq/L   Potassium 3.5 (*) 3.7 - 5.3 mEq/L   Chloride 100  96 - 112 mEq/L   BUN 11  6 - 23 mg/dL   Creatinine, Ser 1.20  0.50 - 1.35 mg/dL   Glucose, Bld 87  70 - 99 mg/dL   Calcium, Ion 1.00 (*) 1.13 - 1.30 mmol/L   TCO2 20  0 - 100 mmol/L   Hemoglobin 10.2 (*) 13.0 - 17.0 g/dL   HCT 30.0 (*) 39.0 - 52.0 %   Ct Head Wo Contrast  12/02/2013   CLINICAL DATA:  Code stroke, right sided weakness  EXAM: CT HEAD WITHOUT CONTRAST  TECHNIQUE: Contiguous axial images were obtained from the base of the skull through the vertex without intravenous contrast.  COMPARISON:  None.  FINDINGS: There is no evidence of mass effect, midline shift, or extra-axial fluid collections. There is no evidence of a space-occupying lesion or intracranial hemorrhage. There is no evidence of a cortical-based area of acute infarction. There is generalized cerebral atrophy. There is periventricular white matter low attenuation likely secondary to microangiopathy.  The ventricles and sulci are  appropriate for the patient's age. The basal cisterns are patent.  Visualized portions of the orbits are unremarkable. There is mucosal thickening involving the right maxillary sinus, right ethmoid sinus and right frontal sinus. There is a left mastoid effusion. Cerebrovascular atherosclerotic calcifications are noted.  The osseous structures are unremarkable.  IMPRESSION: 1. No acute intracranial pathology. 2. Left mastoid effusion. 3. Sinus disease as described above. These results were called by telephone at the time of interpretation on 12/05/2013 at 1:02 pm to Dr. Armida Sans, who verbally acknowledged these results.   Electronically Signed   By: Kathreen Devoid   On: 11/14/2013 13:04   Assessment: 78 y.o. male with altered mental status and reported right sided weakness that is not present at the time of this assessment. Initial hypoglycemia in the low 50s, and thus major concern at this moment is that patient's clinical syndrome is most likely due to severe hypoglycemia and hypotension. Will follow if his syndrome persist after correction of metabolic derangements and hypotension.  Dorian Pod, MD [facility] [phone]  12/11/2013, 1:15 PM

## 2013-11-21 NOTE — H&P (Signed)
Date: 11/23/2013               Patient Name:  [name]. MRN: [id number]  DOB: 1935-10-06 Age / Sex: 78 y.o., male   PCP: Leonard Downing, MD              Medical Service: Internal Medicine Teaching Service              Attending Physician: Dr. Axel Filler, MD    First Contact: Dr. Karle Starch Gustavia Carie Pager: [phone]  Second Contact: Dr. Morrison Old Pager: [phone]            After Hours (After 5p/  First Contact Pager: [phone]  weekends / holidays): Second Contact Pager: [phone]   Chief Complaint:  Unresponsive  History of Present Illness: [name]. is a 78 year old man with PMH of stage IV non-small cell lung cancer, DM2, CODP, HTN, and HLD who presented to the ED after being found unresponsive at home.  [name]'s family reports that he was fatigued and felt chilled last night.  They checked his blood sugar, and it was in the 50s.  They gave him some sugar and a Boost to drink.  His blood sugar eventually improved overnight, but this morning they found him unresponsive and called EMS.  On arrival, he was awake and having a conversation with them, but he again became unresponsive en route and had some apparent R sided weakness. He was heading for [facility], but they diverted to [facility] and called a code stroke.  On arrival, blood sugar was in the 130s and he was tachycardic with increased work of breathing, febrile, hypotensive, and had a normal WBC with elevated bands.  Head CT was negative, but CXR showed a new large L pleural effusion.  He was bolused with NS and received a total of 4.5 L with improvement in his blood pressure.  He was empirically covered with vancomycin/cefepime.  CTA was negative for PE, but demonstrated a significant increase in his pleural effusion from prior and severe emphysematous changes bilaterally.  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  He then received palliative radiation therapy to the enlarging left lower lobe mass followed by docetaxel, which was continued until 09/18/12.  Currently, he is receiving palliative therapy with Nivolumab every two weeks, last dose on 11/13/13.  His next dose is scheduled for 11/27/13.  Review of Systems: Review of Systems  Constitutional: Positive for fever, chills, weight loss, malaise/fatigue and diaphoresis.  HENT: Negative for nosebleeds and sore throat.   Eyes: Negative for blurred vision.  Respiratory: Negative for cough, hemoptysis, sputum production, wheezing and stridor.   Cardiovascular: Negative for chest pain, palpitations and leg swelling.  Gastrointestinal: Negative for heartburn, vomiting and constipation. Diarrhea: Due to chemo. Takes imodium.  Genitourinary: Negative for dysuria and frequency.  Musculoskeletal: Negative for back pain and myalgias.  Skin: Positive for rash (On feet from chemo.).  Neurological: Positive for dizziness and weakness. Negative for focal weakness and headaches.    Meds: Medications Prior to Admission  Medication Sig Dispense Refill  . acetaminophen (TYLENOL) 500 MG tablet Take 500 mg by mouth every 6 (six) hours as needed for mild pain or headache.       . albuterol (PROVENTIL) (5 MG/ML) 0.5% nebulizer solution Take 0.5 mLs (2.5 mg total) by nebulization every 4 (four)  hours as needed for wheezing.  20 mL  3  . atorvastatin (LIPITOR) 20 MG tablet Take 20 mg by mouth daily.      Marland Kitchen glimepiride (AMARYL) 4 MG tablet Take 8 mg by mouth daily.       Marland Kitchen lactose free nutrition (BOOST PLUS) LIQD Take 237 mLs by mouth 2 (two) times daily as needed.      . latanoprost (XALATAN) 0.005 % ophthalmic solution Place 1 drop into both eyes at bedtime.       Marland Kitchen loperamide (IMODIUM) 2 MG capsule Take 2 mg by mouth 4 (four) times daily as needed for diarrhea or loose stools.      . magnesium oxide (MAG-OX) 400  (241.3 MG) MG tablet Take 1 tablet (400 mg total) by mouth daily.  30 tablet  1  . ondansetron (ZOFRAN) 8 MG tablet Take 8 mg by mouth every 8 (eight) hours as needed for nausea or vomiting.      . pioglitazone (ACTOS) 30 MG tablet Take 30 mg by mouth daily.       Marland Kitchen tiotropium (SPIRIVA HANDIHALER) 18 MCG inhalation capsule Place 1 capsule (18 mcg total) into inhaler and inhale daily.  30 capsule  6  . traMADol (ULTRAM) 50 MG tablet Take 50 mg by mouth every 6 (six) hours as needed (pain).       Current Facility-Administered Medications  Medication Dose Route Frequency Provider Last Rate Last Dose  . 0.9 %  sodium chloride infusion   Intravenous Continuous Virgel Manifold, MD 100 mL/hr at 12/02/2013 1321    . acetaminophen (TYLENOL) tablet 500 mg  500 mg Oral Q6H PRN Cresenciano Genre, MD      . albuterol (PROVENTIL) (2.5 MG/3ML) 0.083% nebulizer solution 2.5 mg  2.5 mg Nebulization Q4H PRN Axel Filler, MD      . atorvastatin (LIPITOR) tablet 20 mg  20 mg Oral q1800 Cresenciano Genre, MD      . ceFEPIme (MAXIPIME) 1 g in dextrose 5 % 50 mL IVPB  1 g Intravenous Q12H Darnell Level Mancheril, RPH   1 g at 12/03/2013 1400  . enoxaparin (LOVENOX) injection 40 mg  40 mg Subcutaneous Q24H Cresenciano Genre, MD      . ipratropium-albuterol (DUONEB) 0.5-2.5 (3) MG/3ML nebulizer solution 3 mL  3 mL Nebulization Q6H Cresenciano Genre, MD      . lactose free nutrition (BOOST PLUS) liquid 237 mL  237 mL Oral BID PRN Cresenciano Genre, MD      . latanoprost (XALATAN) 0.005 % ophthalmic solution 1 drop  1 drop Both Eyes QHS Cresenciano Genre, MD      . sodium chloride 0.9 % injection 3 mL  3 mL Intravenous Q12H Cresenciano Genre, MD      . tiotropium [facility]) inhalation capsule 18 mcg  18 mcg Inhalation Daily Cresenciano Genre, MD      . traMADol Veatrice Bourbon) tablet 50 mg  50 mg Oral Q6H PRN Cresenciano Genre, MD      . Derrill Memo ON 11/22/2013] vancomycin (VANCOCIN) IVPB 750 mg/150 ml premix  750 mg Intravenous Q12H [place], RPH        Facility-Administered Medications Ordered in Other Encounters  Medication Dose Route Frequency Provider Last Rate Last Dose  . 0.9 %  sodium chloride infusion  1,000 mL Intravenous Continuous Drue Second, NP      . sodium chloride 0.9 % injection 10 mL  10 mL Intracatheter PRN Julien Nordmann  Mohamed, MD   10 mL at 11/24/11 1637    Allergies: Allergies as of 11/23/2013 - Review Complete 12/02/2013  Allergen Reaction Noted  . Codeine Other (See Comments) 04/13/2011  . Penicillins Other (See Comments) 08/23/2010   Past Medical History  Diagnosis Date  . Diabetes mellitus   . HTN (hypertension)   . High cholesterol   . Hx of radiation therapy 09/22/10 - 11/05/10    LLL lung  . History of chemotherapy   . History of radiation therapy 04/24/12-05/15/12    lllung 35Gy/14dx  . Lung cancer 2012    recurrence 12/2011   Past Surgical History  Procedure Laterality Date  . Tonsillectomy    . Gastrostomy tube placement  2011    has been removed  . Portacath placement  08/2010   Family History  Problem Relation Age of Onset  . Heart disease Mother   . Heart disease Father   . Breast cancer Paternal Aunt    History   Social History  . Marital Status: Married    Spouse Name: N/A    Number of Children: N/A  . Years of Education: N/A   Occupational History  . retired    Social History Main Topics  . Smoking status: Former Smoker -- 4.00 packs/day for 34 years    Types: Cigarettes    Quit date: 03/15/1987  . Smokeless tobacco: Not on file  . Alcohol Use: No  . Drug Use: No  . Sexual Activity: Not on file   Other Topics Concern  . Not on file   Social History Narrative  . No narrative on file    Physical Exam: Filed Vitals:   11/16/2013 1821  BP:   Pulse:   Temp: 97.8 F (36.6 C)  Resp:    Physical Exam  Constitutional: He appears distressed.  HENT:  Head: Normocephalic and atraumatic.  Eyes: Conjunctivae and EOM are normal. Pupils are equal, round, and reactive to  light. No scleral icterus.  Cardiovascular: Intact distal pulses.  Exam reveals no gallop and no friction rub.   No murmur heard. Tachycardic.  Pulmonary/Chest: He is in respiratory distress. He has no wheezes. He has no rales. He exhibits no tenderness.  No breath sounds heard in left lung field.  Right lung field clear to auscultation.  Abdominal: Soft. Bowel sounds are normal. He exhibits no distension. There is no tenderness.  Musculoskeletal: He exhibits no edema and no tenderness.  Neurological: He is alert. No cranial nerve deficit. Coordination normal.  Skin: Rash (Macular rash on feet bilaterally.) noted. He is diaphoretic.    Lab results: Basic Metabolic Panel:  Recent Labs  11/23/2013 1248 11/19/2013 1251  NA 139 137  K 3.7 3.5*  CL 97 100  CO2 19  --   GLUCOSE 83 87  BUN 12 11  CREATININE 1.11 1.20  CALCIUM 8.1*  --    Liver Function Tests:  Recent Labs  11/20/2013 1248  AST 20  ALT 14  ALKPHOS 95  BILITOT 0.7  PROT 6.2  ALBUMIN 2.2*   CBC:  Recent Labs  12/03/2013 1248 11/16/2013 1251  WBC 7.0  --   NEUTROABS 6.5  --   HGB 8.9* 10.2*  HCT 28.3* 30.0*  MCV 91.3  --   PLT 184  --    Cardiac Enzymes:  Recent Labs  12/03/2013 1249  TROPONINI <0.30   CBG:  Recent Labs  11/20/2013 1244  GLUCAP 102*   Coagulation:  Recent Labs  11/28/2013 1248  LABPROT 19.2*  INR 1.62*   Urine Drug Screen: Drugs of Abuse     Component Value Date/Time   LABOPIA NONE DETECTED 12/06/2013 1410   COCAINSCRNUR NONE DETECTED 11/18/2013 1410   LABBENZ NONE DETECTED 12/01/2013 1410   AMPHETMU NONE DETECTED 11/22/2013 1410   THCU NONE DETECTED 11/15/2013 1410   LABBARB NONE DETECTED 11/17/2013 1410    Alcohol Level:  Recent Labs  11/28/2013 1248  ETH <11   Urinalysis:  Recent Labs  12/06/2013 1410  COLORURINE YELLOW  LABSPEC 1.021  PHURINE 5.0  GLUCOSEU NEGATIVE  HGBUR LARGE*  BILIRUBINUR NEGATIVE  KETONESUR NEGATIVE  PROTEINUR NEGATIVE  UROBILINOGEN 0.2   NITRITE NEGATIVE  LEUKOCYTESUR NEGATIVE   Misc. Labs: Lactic Acid, Venous 7.6  Imaging results:  Ct Head Wo Contrast  12/07/2013   CLINICAL DATA:  Code stroke, right sided weakness  EXAM: CT HEAD WITHOUT CONTRAST  TECHNIQUE: Contiguous axial images were obtained from the base of the skull through the vertex without intravenous contrast.  COMPARISON:  None.  FINDINGS: There is no evidence of mass effect, midline shift, or extra-axial fluid collections. There is no evidence of a space-occupying lesion or intracranial hemorrhage. There is no evidence of a cortical-based area of acute infarction. There is generalized cerebral atrophy. There is periventricular white matter low attenuation likely secondary to microangiopathy.  The ventricles and sulci are appropriate for the patient's age. The basal cisterns are patent.  Visualized portions of the orbits are unremarkable. There is mucosal thickening involving the right maxillary sinus, right ethmoid sinus and right frontal sinus. There is a left mastoid effusion. Cerebrovascular atherosclerotic calcifications are noted.  The osseous structures are unremarkable.  IMPRESSION: 1. No acute intracranial pathology. 2. Left mastoid effusion. 3. Sinus disease as described above. These results were called by telephone at the time of interpretation on 12/06/2013 at 1:02 pm to Dr. Armida Sans, who verbally acknowledged these results.   Electronically Signed   By: Kathreen Devoid   On: 11/16/2013 13:04   Ct Angio Chest Pe W/cm &/or Wo Cm  11/25/2013   CLINICAL DATA:  Shortness of breath  EXAM: CT ANGIOGRAPHY CHEST WITH CONTRAST  TECHNIQUE: Multidetector CT imaging of the chest was performed using the standard protocol during bolus administration of intravenous contrast. Multiplanar CT image reconstructions and MIPs were obtained to evaluate the vascular anatomy.  CONTRAST:  9mL OMNIPAQUE IOHEXOL 350 MG/ML SOLN  COMPARISON:  11/08/2013  FINDINGS: Severe emphysematous changes are  noted in the lungs bilaterally. The right lung shows very minimal dependent atelectasis. No sizable effusion is seen. An increasing left-sided pleural effusion is noted when compared with the prior exam. Persistent underlying mass lesion in the left lower lobe is noted with associated compressive atelectasis. Post findings are stable in appearance from the prior exam although the degree of pleural fluid has increased significantly.  Stable subcarinal lymphadenopathy is noted. The thoracic aorta shows mild atherosclerotic changes without aneurysmal dilatation. The pulmonary artery shows a normal branching pattern. Some attenuation is noted in the left lower lobe related to the underlying mass lesion. No definitive pulmonary emboli are seen.  Scanning into the upper abdomen reveals no acute abnormality. No gross bony abnormality is seen.  Review of the MIP images confirms the above findings.  IMPRESSION: No evidence of pulmonary emboli.  Persistent changes in the left lung consistent with the patient's given clinical history. Significant increase in the degree of pleural effusion is noted and this is likely the etiology of the patient's hypoxia.  Electronically Signed   By: Inez Catalina M.D.   On: 11/30/2013 17:03   Dg Chest Port 1 View  11/25/2013   CLINICAL DATA:  Hypoxia  EXAM: PORTABLE CHEST - 1 VIEW  COMPARISON:  CT chest 11/08/2013  FINDINGS: Moderate left pleural effusion. No right pleural effusion. Bilateral interstitial thickening. No pneumothorax. Stable cardiomediastinal silhouette. Right-sided Port-A-Cath in satisfactory position. Unremarkable osseous structures.  IMPRESSION: 1. Moderate left pleural effusion. 2. Bilateral interstitial thickening concerning for mild interstitial edema.   Electronically Signed   By: Kathreen Devoid   On: 11/28/2013 13:41    Other results: EKG: Sinus tachycardia, with possible ST depression in anterior leads, prolonged QTc.  Assessment & Plan by Problem: Active  Problems:   Pneumonia   SIRS (systemic inflammatory response syndrome)   #Sepsis Tachycardic, tachypneic, elevated bands, low blood pressure, lactate of 7.6 consistent with sepsis.  U/A clean except for Hgb, has been having diarrhea, but this is likely related to the Nivolumab.  Chest x-ray shows pleural effusion, likely malignant.  Possible post-obstructive pneumonia given increasing hypoxia.  Received 4.5 L NS in the ER with improvement in blood pressure.  He is unstable and likely somewhat immunosuppressed from his cancer.  Last EF in 07/12/12 EF 50-55% with grade 1 diastolic dysfunction.  Central line available from chemotherapy. -Admit to stepdown with telemetry. -Give additional 500 ml NS, then NS at 100 cc/h. -May need additional bolusing. -Continue oxygen via nasal cannula to maintain sats >92% -Continue vanc/cefepime. -Follow up blood cultures. -Consulted PCCM for help with management, including possible need for pressors. -Arterial blood gas. -Repeat lactic acid at 1800 and tomorrow. -Check magnesium. -Check troponins x3, repeat EKG tomorrow morning. -Repeat U/A. -Repeat BMP and CBC tomorrow morning. -NPO for now. -Tylenol 500 mg q6h for headache. -Sputum culture. -Respiratory virus panel. -Cdiff for diarrhea.  #Stage IV non-small cell lung cancer with large left malignant pleural effusion Failed multiple chemotherapies, currently on Nivolumab (immunotherapy) as palliative treatment.  His diarrhea and rash are likely side effects of this treatment.  He says he manages diarrhea with Imodium, and he is having approximately one episode of diarrhea per day.  Now with recurrence of a large left pleural effusion, which is likely contributing to his hypoxia and blocking possible underlying pneumonia. -Will let Dr. Julien Nordmann know that he is in the hospital. -Consulted PCCM for possible drainage of pleural effusion. -Continue home tramadol 50 mg q6h PRN. -Continue home Boost lactose free  BID PRN when starts eating. -Hold home Imodium. -Zofran PRN.  #COPD Gold stage C that has been stable over the last several months.  On tiotropium daily and albuterol nebs as needed.  Baseline requires 2.5 liters at home. No wheezing in R lung on exam, but may benefit from bronchodilators. -Duonebs q6h. -Continue home Spiriva daily.  #Type 2 diabetes mellitus Low blood sugars possibly contributing to altered mental status, but normal in ER. -Hold home glimepride and pioglitazone.  #HLD -Continue home atorvastatin.  #Glaucoma -Continue home latanoprost drops.  #DVT prophylaxis -Lovenox  #DNR/DNI Do not resuscitate order found in chart, signed by Dr. Asencion Noble of Pulmonology.  We reviewed this decision with the family who reiterated their decision.  They would like to pursue medical therapy, including pressors, and they would accept BiPAP.  However, they do not want CPR or intubation. -Ordered DNR/DNI.  Dispo: Disposition is deferred at this time, awaiting improvement of current medical problems.  The patient does have a current PCP Redmond Pulling Arna Medici, MD), therefore  will not be require OPC follow-up after discharge.   The patient does have transportation limitations that hinder transportation to clinic appointments.   Signed:  Arman Filter, MD, PhD PGY-1 Internal Medicine Teaching Service Pager: [phone] 11/28/2013, 7:21 PM

## 2013-11-21 NOTE — ED Notes (Signed)
Per EMS- Pt is lung cancer pt, receiving chemo, EMS called out for diarrhea. Today was on toliet and had syncopal episode. While in transport, right side became flaccid, pt not responding st 1225, prior to that was a x 4.  HR 127 on NRB, BP 158/118, initial BP 84/50.

## 2013-11-22 ENCOUNTER — Inpatient Hospital Stay (HOSPITAL_COMMUNITY): Payer: Medicare Other

## 2013-11-22 DIAGNOSIS — R6521 Severe sepsis with septic shock: Secondary | ICD-10-CM

## 2013-11-22 DIAGNOSIS — R6520 Severe sepsis without septic shock: Secondary | ICD-10-CM

## 2013-11-22 DIAGNOSIS — C3430 Malignant neoplasm of lower lobe, unspecified bronchus or lung: Secondary | ICD-10-CM

## 2013-11-22 DIAGNOSIS — D6481 Anemia due to antineoplastic chemotherapy: Secondary | ICD-10-CM

## 2013-11-22 DIAGNOSIS — A419 Sepsis, unspecified organism: Secondary | ICD-10-CM

## 2013-11-22 DIAGNOSIS — T451X5A Adverse effect of antineoplastic and immunosuppressive drugs, initial encounter: Secondary | ICD-10-CM

## 2013-11-22 DIAGNOSIS — I5032 Chronic diastolic (congestive) heart failure: Secondary | ICD-10-CM

## 2013-11-22 LAB — CBC WITH DIFFERENTIAL/PLATELET
Basophils Absolute: 0 10*3/uL (ref 0.0–0.1)
Basophils Relative: 0 % (ref 0–1)
EOS PCT: 0 % (ref 0–5)
Eosinophils Absolute: 0 10*3/uL (ref 0.0–0.7)
HCT: 25.7 % — ABNORMAL LOW (ref 39.0–52.0)
Hemoglobin: 8.1 g/dL — ABNORMAL LOW (ref 13.0–17.0)
Lymphocytes Relative: 3 % — ABNORMAL LOW (ref 12–46)
Lymphs Abs: 1 10*3/uL (ref 0.7–4.0)
MCH: 28.1 pg (ref 26.0–34.0)
MCHC: 31.5 g/dL (ref 30.0–36.0)
MCV: 89.2 fL (ref 78.0–100.0)
Monocytes Absolute: 2.4 10*3/uL — ABNORMAL HIGH (ref 0.1–1.0)
Monocytes Relative: 7 % (ref 3–12)
Neutro Abs: 30.7 10*3/uL — ABNORMAL HIGH (ref 1.7–7.7)
Neutrophils Relative %: 90 % — ABNORMAL HIGH (ref 43–77)
PLATELETS: 257 10*3/uL (ref 150–400)
RBC: 2.88 MIL/uL — AB (ref 4.22–5.81)
RDW: 17.5 % — AB (ref 11.5–15.5)
WBC Morphology: INCREASED
WBC: 34.1 10*3/uL — ABNORMAL HIGH (ref 4.0–10.5)

## 2013-11-22 LAB — BASIC METABOLIC PANEL
Anion gap: 18 — ABNORMAL HIGH (ref 5–15)
BUN: 15 mg/dL (ref 6–23)
CALCIUM: 6.6 mg/dL — AB (ref 8.4–10.5)
CO2: 17 mEq/L — ABNORMAL LOW (ref 19–32)
Chloride: 102 mEq/L (ref 96–112)
Creatinine, Ser: 1.17 mg/dL (ref 0.50–1.35)
GFR calc Af Amer: 68 mL/min — ABNORMAL LOW (ref >90)
GFR calc non Af Amer: 59 mL/min — ABNORMAL LOW (ref >90)
GLUCOSE: 213 mg/dL — AB (ref 70–99)
Potassium: 3.6 mEq/L — ABNORMAL LOW (ref 3.7–5.3)
Sodium: 137 mEq/L (ref 137–147)

## 2013-11-22 LAB — RESPIRATORY VIRUS PANEL
Adenovirus: NOT DETECTED
INFLUENZA A H3: NOT DETECTED
INFLUENZA B 1: NOT DETECTED
Influenza A H1: NOT DETECTED
Influenza A: NOT DETECTED
Metapneumovirus: NOT DETECTED
PARAINFLUENZA 2 A: NOT DETECTED
PARAINFLUENZA 3 A: NOT DETECTED
Parainfluenza 1: NOT DETECTED
RESPIRATORY SYNCYTIAL VIRUS A: NOT DETECTED
RHINOVIRUS: NOT DETECTED
Respiratory Syncytial Virus B: NOT DETECTED

## 2013-11-22 LAB — GLUCOSE, CAPILLARY
GLUCOSE-CAPILLARY: 273 mg/dL — AB (ref 70–99)
GLUCOSE-CAPILLARY: 216 mg/dL — AB (ref 70–99)
GLUCOSE-CAPILLARY: 109 mg/dL — AB (ref 70–99)
Glucose-Capillary: 209 mg/dL — ABNORMAL HIGH (ref 70–99)
Glucose-Capillary: 239 mg/dL — ABNORMAL HIGH (ref 70–99)
Glucose-Capillary: 268 mg/dL — ABNORMAL HIGH (ref 70–99)

## 2013-11-22 LAB — MAGNESIUM: Magnesium: 2.1 mg/dL (ref 1.5–2.5)

## 2013-11-22 LAB — BODY FLUID CELL COUNT WITH DIFFERENTIAL
EOS FL: 0 %
Lymphs, Fluid: 66 %
Monocyte-Macrophage-Serous Fluid: 16 % — ABNORMAL LOW (ref 50–90)
NEUTROPHIL FLUID: 18 % (ref 0–25)
OTHER CELLS FL: 0 %
Total Nucleated Cell Count, Fluid: 481 cu mm (ref 0–1000)

## 2013-11-22 LAB — LACTIC ACID, PLASMA: LACTIC ACID, VENOUS: 4.5 mmol/L — AB (ref 0.5–2.2)

## 2013-11-22 LAB — LACTATE DEHYDROGENASE, PLEURAL OR PERITONEAL FLUID: LD FL: 106 U/L — AB (ref 3–23)

## 2013-11-22 LAB — TROPONIN I
TROPONIN I: 4.91 ng/mL — AB (ref <0.30)
Troponin I: 5.14 ng/mL (ref <0.30)

## 2013-11-22 LAB — LACTATE DEHYDROGENASE: LDH: 357 U/L — ABNORMAL HIGH (ref 94–250)

## 2013-11-22 LAB — CORTISOL: CORTISOL PLASMA: 35.1 ug/dL

## 2013-11-22 LAB — PROTEIN, BODY FLUID: Total protein, fluid: 3.3 g/dL

## 2013-11-22 LAB — PROTEIN, TOTAL: Total Protein: 4.7 g/dL — ABNORMAL LOW (ref 6.0–8.3)

## 2013-11-22 LAB — CHOLESTEROL, TOTAL: Cholesterol: 45 mg/dL (ref 0–200)

## 2013-11-22 MED ORDER — SODIUM CHLORIDE 0.9 % IV SOLN
500.0000 mg | Freq: Three times a day (TID) | INTRAVENOUS | Status: DC
  Administered 2013-11-22 – 2013-11-23 (×4): 500 mg via INTRAVENOUS
  Filled 2013-11-22 (×6): qty 500

## 2013-11-22 MED ORDER — PHENYLEPHRINE HCL 10 MG/ML IJ SOLN
30.0000 ug/min | INTRAVENOUS | Status: DC
  Administered 2013-11-22: 170 ug/min via INTRAVENOUS
  Filled 2013-11-22: qty 4

## 2013-11-22 MED ORDER — VASOPRESSIN 20 UNIT/ML IJ SOLN
0.0300 [IU]/min | INTRAVENOUS | Status: DC
  Administered 2013-11-22: 0.03 [IU]/min via INTRAVENOUS
  Filled 2013-11-22 (×2): qty 2

## 2013-11-22 MED ORDER — MAGNESIUM SULFATE 4000MG/100ML IJ SOLN
4.0000 g | Freq: Once | INTRAMUSCULAR | Status: AC
  Administered 2013-11-22: 4 g via INTRAVENOUS
  Filled 2013-11-22: qty 100

## 2013-11-22 MED ORDER — HYDROCORTISONE NA SUCCINATE PF 100 MG IJ SOLR
50.0000 mg | Freq: Four times a day (QID) | INTRAMUSCULAR | Status: DC
  Administered 2013-11-22 – 2013-11-24 (×8): 50 mg via INTRAVENOUS
  Filled 2013-11-22 (×12): qty 1

## 2013-11-22 MED ORDER — DEXTROSE 5 % IV SOLN: 30.0000 ug/min | INTRAVENOUS | Status: DC

## 2013-11-22 MED ORDER — METOCLOPRAMIDE HCL 5 MG/ML IJ SOLN
10.0000 mg | Freq: Once | INTRAMUSCULAR | Status: AC
  Administered 2013-11-23: 10 mg via INTRAVENOUS
  Filled 2013-11-22: qty 2

## 2013-11-22 MED ORDER — INSULIN ASPART 100 UNIT/ML ~~LOC~~ SOLN
0.0000 [IU] | Freq: Three times a day (TID) | SUBCUTANEOUS | Status: DC
  Administered 2013-11-22: 8 [IU] via SUBCUTANEOUS
  Administered 2013-11-22: 5 [IU] via SUBCUTANEOUS
  Administered 2013-11-23: 2 [IU] via SUBCUTANEOUS
  Administered 2013-11-24: 3 [IU] via SUBCUTANEOUS
  Administered 2013-11-24: 15:00:00 via SUBCUTANEOUS
  Administered 2013-11-25 (×2): 3 [IU] via SUBCUTANEOUS

## 2013-11-22 MED ORDER — METRONIDAZOLE IN NACL 5-0.79 MG/ML-% IV SOLN
500.0000 mg | Freq: Four times a day (QID) | INTRAVENOUS | Status: DC
  Administered 2013-11-22 – 2013-11-23 (×5): 500 mg via INTRAVENOUS
  Filled 2013-11-22 (×7): qty 100

## 2013-11-22 MED ORDER — PHENYLEPHRINE HCL 10 MG/ML IJ SOLN
30.0000 ug/min | INTRAVENOUS | Status: DC
  Administered 2013-11-22: 120 ug/min via INTRAVENOUS
  Filled 2013-11-22 (×2): qty 4

## 2013-11-22 MED ORDER — MORPHINE SULFATE 2 MG/ML IJ SOLN
INTRAMUSCULAR | Status: AC
  Administered 2013-11-22: 2 mg via INTRAVENOUS
  Filled 2013-11-22: qty 2

## 2013-11-22 MED ORDER — MORPHINE SULFATE 2 MG/ML IJ SOLN
2.0000 mg | INTRAMUSCULAR | Status: DC | PRN
  Administered 2013-11-23: 2 mg via INTRAVENOUS
  Filled 2013-11-22: qty 1

## 2013-11-22 MED ORDER — DEXTROSE 5 % IV SOLN
2.0000 ug/min | INTRAVENOUS | Status: DC
  Administered 2013-11-22: 5 ug/min via INTRAVENOUS
  Administered 2013-11-22: 2.5 ug/min via INTRAVENOUS
  Filled 2013-11-22: qty 4

## 2013-11-22 MED ORDER — MORPHINE SULFATE 2 MG/ML IJ SOLN
2.0000 mg | Freq: Once | INTRAMUSCULAR | Status: AC
  Administered 2013-11-22: 2 mg via INTRAVENOUS

## 2013-11-22 NOTE — Procedures (Signed)
Central Venous Catheter Insertion Procedure Note [name] [id number] Jul 25, 1935  Procedure: Insertion of Central Venous Catheter Indications: Assessment of intravascular volume, Drug and/or fluid administration and Frequent blood sampling  Procedure Details Consent: Risks of procedure as well as the alternatives and risks of each were explained to the (patient/caregiver).  Consent for procedure obtained. Time Out: Verified patient identification, verified procedure, site/side was marked, verified correct patient position, special equipment/implants available, medications/allergies/relevent history reviewed, required imaging and test results available.  Performed  Maximum sterile technique was used including antiseptics, cap, gloves, gown, hand hygiene, mask and sheet. Skin prep: Chlorhexidine; local anesthetic administered A antimicrobial bonded/coated triple lumen catheter was placed in the left internal jugular vein using the Seldinger technique.  Evaluation Blood flow good Complications: No apparent complications Patient did tolerate procedure well. Chest X-ray ordered to verify placement.  CXR: pending.  Procedure performed under direct ultrasound guidance for real time vessel cannulation.      Montey Hora, [facility] Pgr: [phone]  or [phone]

## 2013-11-22 NOTE — Consult Note (Addendum)
Name: [name]. MRN: [id number] DOB: 29-Nov-1935    ADMISSION DATE:  12/09/2013 CONSULTATION DATE:  11/12/2013  REFERRING MD :  Marinda Elk PRIMARY SERVICE:  IMTS  CHIEF COMPLAINT:  AMS  BRIEF PATIENT DESCRIPTION: 78 yo male with stage IIIB/IV non-small cell lung cancer and GOLD C COPD (PW patient) presented to ED after syncopal episode at home. Initially thought to have possibly had a stroke, further workup revealed more of a infectious type picture. CXR noted new mod-large L pleural effusion. PCCM consulted for further eval.   SIGNIFICANT EVENTS / STUDIES:  9/10 CT head > No acute intracranial process, L mastoid effusion 9/10 CTA Chest > No PE, severe emphysematous changes bilat, large L effusion, Persistent mass LLL w/ associated ATX  LINES / TUBES: PIV  CULTURES: Resp Virus 9/10 >>> Sputum 9/10 >>> C-dif 9/10 > Neg Blood 9/10 >>>  ANTIBIOTICS: Cefepime 9/10 >>> Vancomycin 9/10 >>>   SUBJECTIVE: remains on neo   VITAL SIGNS: Temp:  [97.7 F (36.5 C)-102.7 F (39.3 C)] 98.1 F (36.7 C) (09/11 0930) Pulse Rate:  [33-146] 87 (09/11 0930) Resp:  [16-41] 34 (09/11 0930) BP: (68-125)/(30-95) 109/87 mmHg (09/11 0930) SpO2:  [66 %-100 %] 100 % (09/11 0930) Weight:  [89 kg (196 lb 3.4 oz)-92.9 kg (204 lb 12.9 oz)] 92.9 kg (204 lb 12.9 oz) (09/11 0322)  PHYSICAL EXAMINATION: General:  mild respiratory distress Neuro:  Alert, oriented x 3 HEENT: jvd wnl Cardiovascular: s1 s2 RRR Lungs:  Rhonchi and reduced left base Abdomen:  Soft, non-tender, non-distended Musculoskeletal:  No acute deformity or ROM limitation Skin:  Intact, ecchymosis to BUE. Port-a-cath in place R anterior chest wall.no drainage   Recent Labs Lab 12/07/2013 1248 12/09/2013 1251 11/22/13 0320  NA 139 137 137  K 3.7 3.5* 3.6*  CL 97 100 102  CO2 19  --  17*  BUN 12 11 15   CREATININE 1.11 1.20 1.17  GLUCOSE 83 87 213*    Recent Labs Lab 11/25/2013 1248 11/27/2013 1251 11/22/13 0320  HGB  8.9* 10.2* 8.1*  HCT 28.3* 30.0* 25.7*  WBC 7.0  --  34.1*  PLT 184  --  257   Ct Head Wo Contrast  11/14/2013   CLINICAL DATA:  Code stroke, right sided weakness  EXAM: CT HEAD WITHOUT CONTRAST  TECHNIQUE: Contiguous axial images were obtained from the base of the skull through the vertex without intravenous contrast.  COMPARISON:  None.  FINDINGS: There is no evidence of mass effect, midline shift, or extra-axial fluid collections. There is no evidence of a space-occupying lesion or intracranial hemorrhage. There is no evidence of a cortical-based area of acute infarction. There is generalized cerebral atrophy. There is periventricular white matter low attenuation likely secondary to microangiopathy.  The ventricles and sulci are appropriate for the patient's age. The basal cisterns are patent.  Visualized portions of the orbits are unremarkable. There is mucosal thickening involving the right maxillary sinus, right ethmoid sinus and right frontal sinus. There is a left mastoid effusion. Cerebrovascular atherosclerotic calcifications are noted.  The osseous structures are unremarkable.  IMPRESSION: 1. No acute intracranial pathology. 2. Left mastoid effusion. 3. Sinus disease as described above. These results were called by telephone at the time of interpretation on 12/11/2013 at 1:02 pm to Dr. Armida Sans, who verbally acknowledged these results.   Electronically Signed   By: Kathreen Devoid   On: 12/09/2013 13:04   Ct Angio Chest Pe W/cm &/or Wo Cm  11/23/2013  CLINICAL DATA:  Shortness of breath  EXAM: CT ANGIOGRAPHY CHEST WITH CONTRAST  TECHNIQUE: Multidetector CT imaging of the chest was performed using the standard protocol during bolus administration of intravenous contrast. Multiplanar CT image reconstructions and MIPs were obtained to evaluate the vascular anatomy.  CONTRAST:  68mL OMNIPAQUE IOHEXOL 350 MG/ML SOLN  COMPARISON:  11/08/2013  FINDINGS: Severe emphysematous changes are noted in the lungs  bilaterally. The right lung shows very minimal dependent atelectasis. No sizable effusion is seen. An increasing left-sided pleural effusion is noted when compared with the prior exam. Persistent underlying mass lesion in the left lower lobe is noted with associated compressive atelectasis. Post findings are stable in appearance from the prior exam although the degree of pleural fluid has increased significantly.  Stable subcarinal lymphadenopathy is noted. The thoracic aorta shows mild atherosclerotic changes without aneurysmal dilatation. The pulmonary artery shows a normal branching pattern. Some attenuation is noted in the left lower lobe related to the underlying mass lesion. No definitive pulmonary emboli are seen.  Scanning into the upper abdomen reveals no acute abnormality. No gross bony abnormality is seen.  Review of the MIP images confirms the above findings.  IMPRESSION: No evidence of pulmonary emboli.  Persistent changes in the left lung consistent with the patient's given clinical history. Significant increase in the degree of pleural effusion is noted and this is likely the etiology of the patient's hypoxia.   Electronically Signed   By: Inez Catalina M.D.   On: 11/18/2013 17:03   Dg Chest Port 1 View  11/22/2013   CLINICAL DATA:  Assess pleural effusion  EXAM: PORTABLE CHEST - 1 VIEW  COMPARISON:  11/22/2013  FINDINGS: A right-sided chest wall port and left jugular central line are again seen and stable. The large left-sided pleural effusion is again identified. Some slight improved aeration in the left lung is noted. Mild interstitial changes are seen in the right lung. No acute bony abnormality is seen.  IMPRESSION: Persistent large left-sided pleural effusion.  No new focal abnormality is seen.   Electronically Signed   By: Inez Catalina M.D.   On: 11/22/2013 07:29   Dg Chest Port 1 View  11/22/2013   CLINICAL DATA:  Central line placement  EXAM: PORTABLE CHEST - 1 VIEW  COMPARISON:  CTA  chest dated 12/08/2013  FINDINGS: Left IJ venous catheter terminates at the cavoatrial junction.  Moderate left pleural effusion. Underlying left lower lobe and lingular atelectasis. No pneumothorax.  Right lung is essentially clear.  Right chest port terminates cavoatrial junction.  The heart is top-normal in size.  IMPRESSION: Left IJ venous catheter terminates at the cavoatrial junction.  Otherwise, no interval change from recent CT.   Electronically Signed   By: Julian Hy M.D.   On: 11/22/2013 01:15   Dg Chest Port 1 View  11/29/2013   CLINICAL DATA:  Hypoxia  EXAM: PORTABLE CHEST - 1 VIEW  COMPARISON:  CT chest 11/08/2013  FINDINGS: Moderate left pleural effusion. No right pleural effusion. Bilateral interstitial thickening. No pneumothorax. Stable cardiomediastinal silhouette. Right-sided Port-A-Cath in satisfactory position. Unremarkable osseous structures.  IMPRESSION: 1. Moderate left pleural effusion. 2. Bilateral interstitial thickening concerning for mild interstitial edema.   Electronically Signed   By: Kathreen Devoid   On: 11/22/2013 13:41    ASSESSMENT / PLAN:  PULMONARY A: Acute hypoxic respiratory failure in setting of L effusion and post obstructive ATX (mass) vs HCAP Large left pleural effusion- note last thora from 6/30 demonstrated reactive  mesothelial cells and chronic inflammation. NSCLC s/p radiation and undergoing chemo ? Post obstructive pneumonitis, at risk empyema H/o GOLD C - COPD without evidence of acute exacerbation.  DNI Status P:   Supplemental O2 as needed to maintain SpO2 greater than 92%. Will consider there and diagnostic tap especially given gram neg noted  Continue abx as per ID section. BD regimen adjusted. CXR in AM.  CARDIOVASCULAR Right chest Port A Cath >>> 9/10 IJ>>> A:  Septic Shock Hx HTN Hx HLD DNR Status P:  Neo transition off to levophed Continued vasopressin cvp 10  Variable lactic acid noted Ensure cortisol then empiric  roids  RENAL A:  AG metabolic acidosis - lactate P:   NS @ 100 with rt heart dz, goal 14 BMP in AM. Re assess mag  GASTROINTESTINAL A:   P:   No intervention required.  HEMATOLOGIC A:  VTE prophylaxis P:  Lovenox in setting cancer / SCD's. CBC in AM.  INFECTIOUS A:   Septic Shock - in setting of large left pleural effusion with possible post obstructive HCAP R/o empyema Gram neg bacteremia, may need port dc P:   BCx2 9/10 UCx 9/10 RVP 9/10 Sputum 9/10 Abx: Vanc, start date 9/10>>>9/11  dc Abx: Cefepime, start date 9/10>>>9/11 Imipenem 9./11>>> ABX flagyl (post obstructive) addition 9/11>>>  ENDOCRINE A:  DM R/o rel AI P:   Monitor CBG's. SSI for CBG > 180. Cortisol then stress roids   NEUROLOGIC A:   Acute encephalopathy - resolving. P:   Monitor avoid benzo  TODAY'S SUMMARY:  likley thora will d/w pt, add flagyl, change to levophed, cvp to 14 NOTE, PT IS DNR / DNI STATUS.   I have personally obtained a history, examined the patient, evaluated laboratory and imaging results, formulated the assessment and plan and placed orders. CRITICAL CARE: The patient is critically ill with multiple organ systems failure and requires high complexity decision making for assessment and support, frequent evaluation and titration of therapies, application of advanced monitoring technologies and extensive interpretation of multiple databases. Critical Care Time devoted to patient care services described in this note is 35 minutes.   Raylene Miyamoto MD  Lavon Paganini Titus Mould, MD, [place] Pgr: [facility]

## 2013-11-22 NOTE — Progress Notes (Signed)
RN asking if patient can move to floor - off pressors, dNR, DNI. Apparently therw was conversation about this between pre 9pm eMD and bedside MD  However, for this eMD there is no documentation in chart of intention to transfer  Plan Keep in ICU Bedside MD to assess fitness for transfer > 7am on 11/23/13  Dr. Brand Males, M.D., F.C.C.P Pulmonary and Critical Care Medicine Staff Physician [facility] Pager: [phone], If no answer or between  15:00h - 7:00h: call [phone]  11/22/2013 11:13 PM

## 2013-11-22 NOTE — Progress Notes (Signed)
eLink Physician-Brief Progress Note Patient Name: [name]. DOB: 23-Jun-1935 MRN: [id number]   Date of Service  11/22/2013  HPI/Events of Note  Septic shock Has received > 5L  eICU Interventions  Add vasopressin Bedside NP to place CVL for CVP monitoring     Intervention Category Major Interventions: Shock - evaluation and management  MCQUAID, DOUGLAS 11/22/2013, 12:21 AM

## 2013-11-22 NOTE — Progress Notes (Signed)
CRITICAL VALUE ALERT  Critical value received:  Trop 4.16  Date of notification:  12/03/2013  Time of notification:  2240  Critical value read back:Yes.    Nurse who received alert:  Martinique Perkins  MD notified (1st page):  Zubelevitskiy  Time of first page:  2240  Responding MD:  Zubelevitskiy  Time MD responded:  2240

## 2013-11-22 NOTE — Progress Notes (Addendum)
Nausea despite zofran  Plan reglan 10mg  IV x 1 Check kub at 12:49 AM 11/23/2013   Dr. Brand Males, M.D., [facility].C.P Pulmonary and Critical Care Medicine Staff Physician [place] [facility] Pager: [phone], If no answer or between  15:00h - 7:00h: call [phone]  11/22/2013 11:48 PM

## 2013-11-22 NOTE — Care Management Note (Addendum)
    Page 1 of 1   11/25/2013     4:59:33 PM CARE MANAGEMENT NOTE 11/25/2013  Patient:  [name], [name]   Account Number:  [id number]  Date Initiated:  11/22/2013  Documentation initiated by:  Elissa Hefty  Subjective/Objective Assessment:   adm w sepsis     Action/Plan:   lives w wife, pcp dr Redmond Pulling elkins   Anticipated DC Date:  11/27/2013   Anticipated DC Plan:           Choice offered to / List presented to:             Status of service:  In process, will continue to follow Medicare Important Message given?  YES (If response is "NO", the following Medicare IM given date fields will be blank) Date Medicare IM given:  11/25/2013 Medicare IM given by:  Tomi Bamberger Date Additional Medicare IM given:   Additional Medicare IM given by:    Discharge Disposition:    Per UR Regulation:  Reviewed for med. necessity/level of care/duration of stay  If discussed at [place] of Stay Meetings, dates discussed:    Comments:  11/25/13 [place], BSN [phone] patient conts on nonrebreather.

## 2013-11-22 NOTE — Progress Notes (Signed)
CRITICAL VALUE ALERT  Critical value received:  +BC gram negative rods in anaerobic and aerobic   Date of notification:  11/22/2013  Time of notification:  0500  Critical value read back:Yes.    Nurse who received alert:  Martinique Perkins  MD notified (1st page):  McQuaid  Time of first page:  0505  MD notified (2nd page):  Time of second page:  Responding MD:  Spero Curb   Time MD responded:  [phone]

## 2013-11-22 NOTE — Progress Notes (Signed)
CVC okay to use per Shearon Stalls.

## 2013-11-22 NOTE — Procedures (Signed)
[place] chest 1. Large nonloculated left effusion 2. hazziness pleura unxlear etiology at rib 6  Lavon Paganini. Titus Mould, MD, [place] Pgr: [facility]

## 2013-11-22 NOTE — Procedures (Signed)
Thoracentesis Procedure Note  Pre-operative Diagnosis: large effusion,severe distress, r/o malignant effusion  Post-operative Diagnosis: same  Indications: severe distress, hope for improvement distress, DNI  Procedure Details  Consent: Informed consent was obtained. Risks of the procedure were discussed including: infection, bleeding, pain, pneumothorax.  Under sterile conditions the patient was positioned. Betadine solution and sterile drapes were utilized.  2% buffered lidocaine was used to anesthetize the 6 rib space. Fluid was obtained without any difficulties and minimal blood loss.  A dressing was applied to the wound and wound care instructions were provided.   Findings 2000 ml of clear pleural fluid was obtained. A sample was sent to Pathology for cytogenetics, flow, and cell counts, as well as for infection analysis.  Complications:  None; patient tolerated the procedure well.          Condition: stable  At risk lung not reexpanding, effusion was huge, needle way far away from lung by [place]  Plan A follow up chest x-ray was ordered. Bed Rest for 2 hours. Tylenol 650 mg. for pain.  Attending Attestation: I was present and scrubbed for the entire procedure. perfromed with Alfredo Martinez, NP  Lavon Paganini. Titus Mould, MD, [place] Pgr: [facility]

## 2013-11-22 NOTE — Progress Notes (Signed)
78yo male on IV ABX for presumed HCAP, blood cx now growing GNR, to change from cefepime to imipenem.  Will start Primaxin 500mg  IV Q8H for wt 93kg and CrCl ~60 ml/min and monitor CBC and Cx.  Wynona Neat, PharmD, BCPS 11/22/2013 5:14 AM

## 2013-11-22 NOTE — Progress Notes (Signed)
eLink Physician-Brief Progress Note Patient Name: [name]. DOB: May 12, 1935 MRN: [id number]   Date of Service  11/22/2013  HPI/Events of Note  Septic shock with high dose pressor requirements Now blood cultures growing GNR Has received Cefepime Presumed HCAP; port infection?  eICU Interventions  Change to imipenem Consider port removal this morning     Intervention Category Intermediate Interventions: Infection - evaluation and management  MCQUAID, DOUGLAS 11/22/2013, 5:07 AM

## 2013-11-22 NOTE — Progress Notes (Signed)
Inpatient Diabetes Program Recommendations  AACE/ADA: New Consensus Statement on Inpatient Glycemic Control (2013)  Target Ranges:  Prepandial:   less than 140 mg/dL      Peak postprandial:   less than 180 mg/dL (1-2 hours)      Critically ill patients:  140 - 180 mg/dL  Results for Sottile, DEAVIN FORST (MRN [id number]) as of 11/22/2013 09:26  Ref. Range 11/18/2013 22:08 12/06/2013 23:24 11/22/2013 03:11 11/22/2013 07:21 11/22/2013 08:01  Glucose-Capillary Latest Range: 70-99 mg/dL 99 96 209 (H) 273 (H) 239 (H)   Inpatient Diabetes Program Recommendations Correction (SSI): Please add Novolog moderate correction TID + HS scale per Glycemic Control Order set Thank you  Raoul Pitch BSN, RN,CDE Inpatient Diabetes Coordinator [phone] (team pager)

## 2013-11-23 ENCOUNTER — Inpatient Hospital Stay (HOSPITAL_COMMUNITY): Payer: Medicare Other

## 2013-11-23 DIAGNOSIS — R7881 Bacteremia: Secondary | ICD-10-CM | POA: Diagnosis present

## 2013-11-23 DIAGNOSIS — B49 Unspecified mycosis: Secondary | ICD-10-CM | POA: Diagnosis present

## 2013-11-23 LAB — CBC WITH DIFFERENTIAL/PLATELET
BASOS ABS: 0 10*3/uL (ref 0.0–0.1)
BASOS PCT: 0 % (ref 0–1)
EOS ABS: 0 10*3/uL (ref 0.0–0.7)
Eosinophils Relative: 0 % (ref 0–5)
HCT: 23.5 % — ABNORMAL LOW (ref 39.0–52.0)
Hemoglobin: 7.6 g/dL — ABNORMAL LOW (ref 13.0–17.0)
Lymphocytes Relative: 4 % — ABNORMAL LOW (ref 12–46)
Lymphs Abs: 0.5 10*3/uL — ABNORMAL LOW (ref 0.7–4.0)
MCH: 28.9 pg (ref 26.0–34.0)
MCHC: 32.3 g/dL (ref 30.0–36.0)
MCV: 89.4 fL (ref 78.0–100.0)
Monocytes Absolute: 0.5 10*3/uL (ref 0.1–1.0)
Monocytes Relative: 4 % (ref 3–12)
Neutro Abs: 10.9 10*3/uL — ABNORMAL HIGH (ref 1.7–7.7)
Neutrophils Relative %: 92 % — ABNORMAL HIGH (ref 43–77)
PLATELETS: 112 10*3/uL — AB (ref 150–400)
RBC: 2.63 MIL/uL — ABNORMAL LOW (ref 4.22–5.81)
RDW: 17.6 % — AB (ref 11.5–15.5)
WBC: 11.9 10*3/uL — ABNORMAL HIGH (ref 4.0–10.5)

## 2013-11-23 LAB — GLUCOSE, CAPILLARY
Glucose-Capillary: 119 mg/dL — ABNORMAL HIGH (ref 70–99)
Glucose-Capillary: 128 mg/dL — ABNORMAL HIGH (ref 70–99)
Glucose-Capillary: 126 mg/dL — ABNORMAL HIGH (ref 70–99)
Glucose-Capillary: 187 mg/dL — ABNORMAL HIGH (ref 70–99)

## 2013-11-23 LAB — COMPREHENSIVE METABOLIC PANEL
ALK PHOS: 87 U/L (ref 39–117)
ALT: 152 U/L — ABNORMAL HIGH (ref 0–53)
AST: 226 U/L — AB (ref 0–37)
Albumin: 1.9 g/dL — ABNORMAL LOW (ref 3.5–5.2)
Anion gap: 10 (ref 5–15)
BILIRUBIN TOTAL: 0.5 mg/dL (ref 0.3–1.2)
BUN: 18 mg/dL (ref 6–23)
CHLORIDE: 109 meq/L (ref 96–112)
CO2: 24 meq/L (ref 19–32)
Calcium: 7.7 mg/dL — ABNORMAL LOW (ref 8.4–10.5)
Creatinine, Ser: 0.99 mg/dL (ref 0.50–1.35)
GFR calc Af Amer: 90 mL/min — ABNORMAL LOW (ref >90)
GFR calc non Af Amer: 78 mL/min — ABNORMAL LOW (ref >90)
GLUCOSE: 108 mg/dL — AB (ref 70–99)
Potassium: 3.6 mEq/L — ABNORMAL LOW (ref 3.7–5.3)
SODIUM: 143 meq/L (ref 137–147)
Total Protein: 5.3 g/dL — ABNORMAL LOW (ref 6.0–8.3)

## 2013-11-23 LAB — FUNGAL STAIN: FUNGAL SMEAR: NONE SEEN

## 2013-11-23 LAB — PH, BODY FLUID: PH, FLUID: 8

## 2013-11-23 MED ORDER — SODIUM CHLORIDE 0.9 % IV SOLN
500.0000 mg | Freq: Four times a day (QID) | INTRAVENOUS | Status: DC
  Administered 2013-11-23 – 2013-11-26 (×11): 500 mg via INTRAVENOUS
  Filled 2013-11-23 (×16): qty 500

## 2013-11-23 MED ORDER — SODIUM CHLORIDE 0.9 % IV SOLN: 100.0000 mg | Freq: Every day | INTRAVENOUS | Status: DC

## 2013-11-23 MED ORDER — FUROSEMIDE 10 MG/ML IJ SOLN
30.0000 mg | Freq: Once | INTRAMUSCULAR | Status: AC
  Administered 2013-11-23: 30 mg via INTRAVENOUS

## 2013-11-23 MED ORDER — FLUCONAZOLE IN SODIUM CHLORIDE 400-0.9 MG/200ML-% IV SOLN
400.0000 mg | Freq: Every day | INTRAVENOUS | Status: DC
  Administered 2013-11-23: 400 mg via INTRAVENOUS
  Filled 2013-11-23: qty 200

## 2013-11-23 MED ORDER — FUROSEMIDE 10 MG/ML IJ SOLN
INTRAMUSCULAR | Status: AC
  Administered 2013-11-23: 10 mg
  Filled 2013-11-23: qty 4

## 2013-11-23 MED ORDER — SODIUM CHLORIDE 0.9 % IV SOLN
100.0000 mg | Freq: Every day | INTRAVENOUS | Status: DC
  Administered 2013-11-23 – 2013-11-24 (×2): 100 mg via INTRAVENOUS
  Filled 2013-11-23 (×3): qty 100

## 2013-11-23 MED ORDER — SODIUM CHLORIDE 0.9 % IJ SOLN
10.0000 mL | INTRAMUSCULAR | Status: DC | PRN
  Administered 2013-11-23 – 2013-11-25 (×4): 10 mL
  Administered 2013-11-25: 30 mL

## 2013-11-23 NOTE — Progress Notes (Signed)
cotninue nausea and vomit despite reglan and zofran  AXR shows gaseous distensino of stomach  Also, blood culture with yeast   Plan Drop ng tube to  LIS Start diflucan per pharmacy - might need change     Dr. Brand Males, M.D., [facility].C.P Pulmonary and Critical Care Medicine Staff Physician [place] Pager: [phone], If no answer or between  15:00h - 7:00h: call [phone]  11/23/2013 1:58 AM

## 2013-11-23 NOTE — Progress Notes (Signed)
CRITICAL VALUE ALERT  Critical value received:  Blood culture positive for yeast   Date of notification:  11/23/13  Time of notification:  0140  Critical value read back:Yes.    Nurse who received alert:  Cipriano Mile RN  MD notified (1st page):  Dr. Chase Caller  Time of first page:  0156  MD notified (2nd page):  Time of second page:  Responding MD:  Dr. Chase Caller  Time MD responded: [phone]

## 2013-11-23 NOTE — Consult Note (Signed)
[facility]    Date of Admission:  12/09/2013   Total days of antibiotics 3        Day 2 imipenem        Day 1 micafungin              Reason for Consult: Automatic consultation for fungemia     Principal Problem:   Severe sepsis with septic shock Active Problems:   Fungemia   Bacteremia due to Gram-negative bacteria   Lung cancer   Diabetes   DNR (do not resuscitate)   Antineoplastic chemotherapy induced anemia(285.3)   Diarrhea   Pneumonia   Recurrent left pleural effusion   Hematuria   Long QT interval   . antiseptic oral rinse  7 mL Mouth Rinse BID  . aspirin  325 mg Oral Daily  . atorvastatin  20 mg Oral q1800  . enoxaparin (LOVENOX) injection  40 mg Subcutaneous Q24H  . hydrocortisone sodium succinate  50 mg Intravenous Q6H  . imipenem-cilastatin  500 mg Intravenous Q6H  . insulin aspart  0-15 Units Subcutaneous TID WC  . ipratropium-albuterol  3 mL Nebulization Q6H  . latanoprost  1 drop Both Eyes QHS  . micafungin [facility]) IV  100 mg Intravenous Daily  . sodium chloride  3 mL Intravenous Q12H    Recommendations: 1. Agree with current antibiotic therapy pending final blood culture results 2. Repeat blood cultures   Assessment: He has polymicrobial bloodstream infection with gram-negative rods and yeast. His Port-A-Cath may be the source. His pleural fluid looks like an uninfected transudate. I will repeat blood cultures now. I agree with current empiric antibiotic therapy. There is no urgent need to remove his Port-A-Cath.   HPI: [name]. is a 77 y.o. male who was recently diagnosed with non-small cell lung cancer. He underwent chemotherapy and daily radiation therapy but followup revealed progressive disease. He is currently on immunotherapy. Two days ago he had the sudden onset of rigors, and hypoglycemia and then became unresponsive while seated on the toilet. He was admitted and found to have a temperature  of 102.7. One admission blood culture drawn through his Port-A-Cath is growing gram-negative rods and 1 drawn from a peripheral stick is growing yeast. He has not noted any recent problems with his Port-A-Cath. He has had some mild dry cough in the last few weeks which actually has gotten a little bit better. He has had anorexia, nausea, vomiting and diarrhea which he says is related to his chemotherapy. He does not recall much about his acute illness but he and his wife state that he is feeling dramatically better now. Yesterday he had thoracentesis of his enlarging left pleural effusion.   Review of Systems: Constitutional: positive for anorexia, chills, fevers, malaise and weight loss, negative for sweats Eyes: negative Ears, nose, mouth, throat, and face: negative Respiratory: positive for cough, negative for dyspnea on exertion, pleurisy/chest pain and sputum Cardiovascular: negative Gastrointestinal: positive for diarrhea, nausea and vomiting, negative for abdominal pain Genitourinary:negative  Past Medical History  Diagnosis Date  . Diabetes mellitus   . HTN (hypertension)   . High cholesterol   . Hx of radiation therapy 09/22/10 - 11/05/10    LLL lung  . History of chemotherapy   . History of radiation therapy 04/24/12-05/15/12    lllung 35Gy/14dx  . Lung cancer 2012    recurrence 12/2011    History  Substance Use Topics  .  Smoking status: Former Smoker -- 4.00 packs/day for 34 years    Types: Cigarettes    Quit date: 03/15/1987  . Smokeless tobacco: Not on file  . Alcohol Use: No    Family History  Problem Relation Age of Onset  . Heart disease Mother   . Heart disease Father   . Breast cancer Paternal Aunt    Allergies  Allergen Reactions  . Codeine Other (See Comments)    Made pt unrepsonsive  . Penicillins Other (See Comments)    unknown    OBJECTIVE: Blood pressure 100/54, pulse 105, temperature 98.1 F (36.7 C), temperature source Oral, resp. rate 20,  height 5' 10.5" (1.791 m), weight 207 lb 10.8 oz (94.2 kg), SpO2 90.00%. General: He is alert and comfortable visiting with his wife Skin: Scattered ecchymoses Lungs: Few wheezes on left Cor: Regular S1 and S2 with no murmur Chest: Right anterior chest Port-A-Cath site appears normal Abdomen: Soft and nontender   Lab Results Lab Results  Component Value Date   WBC 11.9* 11/23/2013   HGB 7.6* 11/23/2013   HCT 23.5* 11/23/2013   MCV 89.4 11/23/2013   PLT 112* 11/23/2013    Lab Results  Component Value Date   CREATININE 0.99 11/23/2013   BUN 18 11/23/2013   NA 143 11/23/2013   K 3.6* 11/23/2013   CL 109 11/23/2013   CO2 24 11/23/2013    Lab Results  Component Value Date   ALT 152* 11/23/2013   AST 226* 11/23/2013   ALKPHOS 87 11/23/2013   BILITOT 0.5 11/23/2013     Microbiology: Recent Results (from the past 240 hour(s))  CULTURE, BLOOD (ROUTINE X 2)     Status: None   Collection Time    12/02/2013  1:44 PM      Result Value Ref Range Status   Specimen Description BLOOD RIGHT CHEST PORTA CATH   Final   Special Requests BOTTLES DRAWN AEROBIC AND ANAEROBIC 10CC   Final   Culture  Setup Time     Final   Value: 12/04/2013 17:30     Performed at [facility]   Culture     Final   Value: GRAM NEGATIVE RODS     Note: Gram Stain Report Called to,Read Back By and Verified With:  JORDAN PERKINS @0500 ON 11/22/13 SMIAS     Performed at [facility]   Report Status PENDING   Incomplete  CLOSTRIDIUM DIFFICILE BY PCR     Status: None   Collection Time    11/13/2013  2:55 PM      Result Value Ref Range Status   C difficile by pcr NEGATIVE  NEGATIVE Final  CULTURE, BLOOD (ROUTINE X 2)     Status: None   Collection Time    11/27/2013  3:40 PM      Result Value Ref Range Status   Specimen Description BLOOD LEFT HAND   Final   Special Requests BOTTLES DRAWN AEROBIC AND ANAEROBIC 10CC   Final   Culture  Setup Time     Final   Value: 11/27/2013 20:19     Performed at [facility]   Culture     Final   Value: YEAST     Note: Gram Stain Report Called to,Read Back By and Verified With: SHANNA STOWE @0150 11/23/13 SMIAS     Performed at [facility]   Report Status PENDING   Incomplete  RESPIRATORY VIRUS PANEL     Status: None     Collection Time    11/14/2013  6:20 PM      Result Value Ref Range Status   Source - RVPAN NASAL SWAB   Corrected   Comment: CORRECTED ON 09/11 AT 1921: PREVIOUSLY REPORTED AS NASAL SWAB   Respiratory Syncytial Virus A NOT DETECTED   Final   Respiratory Syncytial Virus B NOT DETECTED   Final   Influenza A NOT DETECTED   Final   Influenza B NOT DETECTED   Final   Parainfluenza 1 NOT DETECTED   Final   Parainfluenza 2 NOT DETECTED   Final   Parainfluenza 3 NOT DETECTED   Final   Metapneumovirus NOT DETECTED   Final   Rhinovirus NOT DETECTED   Final   Adenovirus NOT DETECTED   Final   Influenza A H1 NOT DETECTED   Final   Influenza A H3 NOT DETECTED   Final   Comment: (NOTE)           Normal Reference Range for each Analyte: NOT DETECTED     Testing performed using the Luminex xTAG Respiratory Viral Panel test     kit.     The analytical performance characteristics of this assay have been     determined by [facility].  The modifications have not been     cleared or approved by the FDA. This assay has been validated pursuant     to the CLIA regulations and is used for clinical purposes.     Performed at [facility]  BODY FLUID CULTURE     Status: None   Collection Time    11/22/13  1:22 PM      Result Value Ref Range Status   Specimen Description PLEURAL FLUID LEFT   Final   Special Requests 6.0ML FLUID   Final   Gram Stain     Final   Value: RARE WBC PRESENT,BOTH PMN AND MONONUCLEAR     NO ORGANISMS SEEN     Performed at [facility]   Culture PENDING   Incomplete   Report Status PENDING   Incomplete    John Campbell, MD [facility] [phone] pager   [phone] cell 11/23/2013, 2:01 PM  

## 2013-11-23 NOTE — Progress Notes (Signed)
Name: [name]. MRN: [id number] DOB: Sep 17, 1935    ADMISSION DATE:  12/01/2013 CONSULTATION DATE:  11/20/2013  REFERRING MD :  Marinda Elk PRIMARY SERVICE:  IMTS  CHIEF COMPLAINT:  AMS  BRIEF PATIENT DESCRIPTION: 78 yo male with stage IIIB/IV non-small cell lung cancer and GOLD C COPD (PW patient) presented to ED after syncopal episode at home. Initially thought to have possibly had a stroke, further workup revealed more of a infectious type picture. CXR noted new mod-large L pleural effusion. PCCM consulted for further eval.   SIGNIFICANT EVENTS / STUDIES:  9/10 CT head > No acute intracranial process, L mastoid effusion 9/10 CTA Chest > No PE, severe emphysematous changes bilat, large L effusion, Persistent mass LLL w/ associated ATX 9/11 Emergent  thoracentesis>>> 2L clear fluid, protein 3.3, LDH 106, wbc 481, major improved status  LINES / TUBES: PIV   SUBJECTIVE: Off pressors.  Nausea/vomiting overnight.    VITAL SIGNS: Temp:  [97.6 F (36.4 C)-99.5 F (37.5 C)] 97.6 F (36.4 C) (09/12 1237) Pulse Rate:  [92-144] 101 (09/12 1237) Resp:  [16-31] 31 (09/12 1237) BP: (89-143)/(43-73) 120/57 mmHg (09/12 1237) SpO2:  [89 %-100 %] 92 % (09/12 1237) Weight:  [207 lb 10.8 oz (94.2 kg)] 207 lb 10.8 oz (94.2 kg) (09/12 0319)  PHYSICAL EXAMINATION: General:  Pleasant elderly male, NAD  Neuro:  Alert, oriented x 3 HEENT: mm dry, no JVD, ng tube  Cardiovascular: s1 s2 RRR Lungs:  resps even, mildly tachypneic, diminished bases  Abdomen:  Soft, non-tender, non-distended Musculoskeletal:  No acute deformity or ROM limitation Skin:  Intact, ecchymosis to BUE. Port-a-cath in place R anterior chest wall.no drainage   Recent Labs Lab 11/14/2013 1248 12/04/2013 1251 11/22/13 0320 11/23/13 0530  NA 139 137 137 143  K 3.7 3.5* 3.6* 3.6*  CL 97 100 102 109  CO2 19  --  17* 24  BUN 12 11 15 18   CREATININE 1.11 1.20 1.17 0.99  GLUCOSE 83 87 213* 108*    Recent Labs Lab  12/05/2013 1248 12/06/2013 1251 11/22/13 0320 11/23/13 0530  HGB 8.9* 10.2* 8.1* 7.6*  HCT 28.3* 30.0* 25.7* 23.5*  WBC 7.0  --  34.1* 11.9*  PLT 184  --  257 112*   Ct Angio Chest Pe W/cm &/or Wo Cm  11/20/2013   CLINICAL DATA:  Shortness of breath  EXAM: CT ANGIOGRAPHY CHEST WITH CONTRAST  TECHNIQUE: Multidetector CT imaging of the chest was performed using the standard protocol during bolus administration of intravenous contrast. Multiplanar CT image reconstructions and MIPs were obtained to evaluate the vascular anatomy.  CONTRAST:  40mL OMNIPAQUE IOHEXOL 350 MG/ML SOLN  COMPARISON:  11/08/2013  FINDINGS: Severe emphysematous changes are noted in the lungs bilaterally. The right lung shows very minimal dependent atelectasis. No sizable effusion is seen. An increasing left-sided pleural effusion is noted when compared with the prior exam. Persistent underlying mass lesion in the left lower lobe is noted with associated compressive atelectasis. Post findings are stable in appearance from the prior exam although the degree of pleural fluid has increased significantly.  Stable subcarinal lymphadenopathy is noted. The thoracic aorta shows mild atherosclerotic changes without aneurysmal dilatation. The pulmonary artery shows a normal branching pattern. Some attenuation is noted in the left lower lobe related to the underlying mass lesion. No definitive pulmonary emboli are seen.  Scanning into the upper abdomen reveals no acute abnormality. No gross bony abnormality is seen.  Review of the MIP images confirms the  above findings.  IMPRESSION: No evidence of pulmonary emboli.  Persistent changes in the left lung consistent with the patient's given clinical history. Significant increase in the degree of pleural effusion is noted and this is likely the etiology of the patient's hypoxia.   Electronically Signed   By: Inez Catalina M.D.   On: 12/05/2013 17:03   Dg Chest Port 1 View  11/23/2013   CLINICAL DATA:   Evaluate lung status post left thoracentesis  EXAM: PORTABLE CHEST - 1 VIEW  COMPARISON:  11/22/2013  FINDINGS: Port-A-Cath, left central line, stable. Interval placement of NG tube which crosses the gastroesophageal junction.  Opacity in the left lower lobe representing a combination of pleural effusion and underlying consolidation, without significant interval change. Mild interstitial change central right lung, stable.  IMPRESSION: No significant change from 11/22/2013 other than status post placement of nasogastric tube.   Electronically Signed   By: Skipper Cliche M.D.   On: 11/23/2013 07:55   Dg Chest Port 1 View  11/22/2013   CLINICAL DATA:  Status post left thoracentesis  EXAM: PORTABLE CHEST - 1 VIEW  COMPARISON:  Film from earlier in the same day  FINDINGS: Cardiac shadow is stable. Decrease in the degree of left pleural effusion is noted. No pneumothorax is seen. A right-sided chest wall port and left jugular central line are again seen and stable. Mild interstitial changes are again noted in the right lung. No new acute abnormality is seen.  IMPRESSION: Significant reduction in left-sided pleural effusion without pneumothorax.   Electronically Signed   By: Inez Catalina M.D.   On: 11/22/2013 13:25   Dg Chest Port 1 View  11/22/2013   CLINICAL DATA:  Assess pleural effusion  EXAM: PORTABLE CHEST - 1 VIEW  COMPARISON:  11/22/2013  FINDINGS: A right-sided chest wall port and left jugular central line are again seen and stable. The large left-sided pleural effusion is again identified. Some slight improved aeration in the left lung is noted. Mild interstitial changes are seen in the right lung. No acute bony abnormality is seen.  IMPRESSION: Persistent large left-sided pleural effusion.  No new focal abnormality is seen.   Electronically Signed   By: Inez Catalina M.D.   On: 11/22/2013 07:29   Dg Chest Port 1 View  11/22/2013   CLINICAL DATA:  Central line placement  EXAM: PORTABLE CHEST - 1 VIEW   COMPARISON:  CTA chest dated 11/13/2013  FINDINGS: Left IJ venous catheter terminates at the cavoatrial junction.  Moderate left pleural effusion. Underlying left lower lobe and lingular atelectasis. No pneumothorax.  Right lung is essentially clear.  Right chest port terminates cavoatrial junction.  The heart is top-normal in size.  IMPRESSION: Left IJ venous catheter terminates at the cavoatrial junction.  Otherwise, no interval change from recent CT.   Electronically Signed   By: Julian Hy M.D.   On: 11/22/2013 01:15   Dg Chest Port 1 View  12/05/2013   CLINICAL DATA:  Hypoxia  EXAM: PORTABLE CHEST - 1 VIEW  COMPARISON:  CT chest 11/08/2013  FINDINGS: Moderate left pleural effusion. No right pleural effusion. Bilateral interstitial thickening. No pneumothorax. Stable cardiomediastinal silhouette. Right-sided Port-A-Cath in satisfactory position. Unremarkable osseous structures.  IMPRESSION: 1. Moderate left pleural effusion. 2. Bilateral interstitial thickening concerning for mild interstitial edema.   Electronically Signed   By: Kathreen Devoid   On: 12/06/2013 13:41   Dg Abd Portable 1v  11/23/2013   CLINICAL DATA:  Nausea and vomiting.  EXAM: PORTABLE ABDOMEN - 1 VIEW  COMPARISON:  11/08/2013.  FINDINGS: Moderate gaseous distention of the stomach is noted. No findings for small bowel obstruction or free air.  IMPRESSION: Moderate gaseous distention of the stomach.   Electronically Signed   By: Kalman Jewels M.D.   On: 11/23/2013 01:12    ASSESSMENT / PLAN:  PULMONARY A: Acute hypoxic respiratory failure in setting of L effusion and post obstructive ATX (mass) vs HCAP Large left pleural effusion- note last thora from 6/30 demonstrated reactive mesothelial cells and chronic inflammation. NSCLC s/p radiation and undergoing chemo ? Post obstructive pneumonitis H/o GOLD C - COPD without evidence of acute exacerbation.  DNI Status P:   Supplemental O2 as needed to maintain SpO2 greater  than 92%. S/p thoracentesis 9/11 with major clinical progress Continue abx as per ID section. Cont BD  CXR in AM some increase in edema right Follow cytology, if pos and survivable admission - pleurex  CARDIOVASCULAR Right chest Port A Cath >>> 9/10 IJ>>> A:  Septic Shock Hx HTN Hx HLD DNR Status P:  Off pressors  Cont stress steroids and reduce in am if BP maintained on own  RENAL A:  AG metabolic acidosis - lactate. Improved.  P:   NS@75  to kvo Lasix in BP tolerates F/u chem  F/u mg, phos   GASTROINTESTINAL A:  Nausea, vomiting.  ?ileus  P:   Cont NGTube  NPO Treat sepsis PRN zofran   HEMATOLOGIC A:  VTE prophylaxis P:  Lovenox in setting cancer / SCD's. CBC in AM.  INFECTIOUS A:   Septic Shock - in setting of large left pleural effusion with possible post obstructive HCAP Doubt empyema  Gram neg bacteremia, may need port dc  P:   Resp Virus 9/10 >>>neg Sputum 9/10 >>> C-dif 9/10 > Neg Blood 9/10 >>>1/2 GNR,  1/2 Yeast>>> Imipenem 9/11>>> Diflucan 9/11>>>9/12 Mycofungin 9/12>>> Flagyl 9/11>>>9/12  Cont empiric abx as above Dc flagyl with identified organisms Change diflucan to caspo F/u culture data to final ?consider d/c port - need to d/w heme/onc likely and d/w with patient on aggressiveness of care going forward  ENDOCRINE A:  DM R/o rel AI P:   Monitor CBG's. SSI for CBG > 180. Cont stress roids, reduce in am   NEUROLOGIC A:   Acute encephalopathy - resolving. P:   Monitor avoid benzo Will need pt  TODAY'S SUMMARY:  Improving.  Off pressors.  Tx floor.  NOTE, PT IS DNR / DNI STATUS.    I have personally obtained a history, examined the patient, evaluated laboratory and imaging results, formulated the assessment and plan and placed orders.   Nickolas Madrid, NP 11/23/2013  1:08 PM Pager: [phone] or [phone]  I have fully examined this patient and agree with above findings.     Lavon Paganini. Titus Mould, MD,  [facility] Pgr: [facility]

## 2013-11-23 NOTE — Progress Notes (Signed)
ANTIBIOTIC CONSULT NOTE - FOLLOW UP  Pharmacy Consult for imipenem Indication: bacteremia  Allergies  Allergen Reactions  . Codeine Other (See Comments)    Made pt unrepsonsive  . Penicillins Other (See Comments)    unknown    Patient Measurements: Height: 5' 10.5" (179.1 cm) Weight: 207 lb 10.8 oz (94.2 kg) IBW/kg (Calculated) : 74.15   Vital Signs: Temp: 98.1 F (36.7 C) (09/12 1300) Temp src: Oral (09/12 1237) BP: 100/54 mmHg (09/12 1300) Pulse Rate: 105 (09/12 1300) Intake/Output from previous day: 09/11 0701 - 09/12 0700 In: 3706.3 [I.V.:2806.3; IV Piggyback:900] Out: 5090 [Urine:2690; Emesis/NG output:400] Intake/Output from this shift: Total I/O In: 738.3 [I.V.:638.3; IV Piggyback:100] Out: 345 [Urine:345]  Labs:  Recent Labs  12/06/2013 1248 12/07/2013 1251 11/22/13 0320 11/23/13 0530  WBC 7.0  --  34.1* 11.9*  HGB 8.9* 10.2* 8.1* 7.6*  PLT 184  --  257 112*  CREATININE 1.11 1.20 1.17 0.99   Estimated Creatinine Clearance: 73.8 ml/min (by C-G formula based on Cr of 0.99). No results found for this basename: VANCOTROUGH, Corlis Leak, VANCORANDOM, GENTTROUGH, GENTPEAK, GENTRANDOM, TOBRATROUGH, TOBRAPEAK, TOBRARND, AMIKACINPEAK, AMIKACINTROU, AMIKACIN,  in the last 72 hours   Microbiology: Recent Results (from the past 720 hour(s))  CULTURE, BLOOD (ROUTINE X 2)     Status: None   Collection Time    12/11/2013  1:44 PM      Result Value Ref Range Status   Specimen Description BLOOD RIGHT CHEST PORTA CATH   Final   Special Requests BOTTLES DRAWN AEROBIC AND ANAEROBIC 10CC   Final   Culture  Setup Time     Final   Value: 12/08/2013 17:30     Performed at [facility]   Culture     Final   Value: GRAM NEGATIVE RODS     Note: Gram Stain Report Called to,Read Back By and Verified With:  [place] PERKINS _0  ON 11/22/13 SMIAS     Performed at [facility]   Report Status PENDING   Incomplete  CLOSTRIDIUM DIFFICILE BY PCR     Status: None   Collection Time    12/02/2013  2:55 PM      Result Value Ref Range Status   C difficile by pcr NEGATIVE  NEGATIVE Final  CULTURE, BLOOD (ROUTINE X 2)     Status: None   Collection Time    11/25/2013  3:40 PM      Result Value Ref Range Status   Specimen Description BLOOD LEFT HAND   Final   Special Requests BOTTLES DRAWN AEROBIC AND ANAEROBIC 10CC   Final   Culture  Setup Time     Final   Value: 11/19/2013 20:19     Performed at [facility]   Culture     Final   Value: YEAST     Note: Gram Stain Report Called to,Read Back By and Verified With: [place] _1  11/23/13 SMIAS     Performed at [facility]   Report Status PENDING   Incomplete  RESPIRATORY VIRUS PANEL     Status: None   Collection Time    11/23/2013  6:20 PM      Result Value Ref Range Status   Source - RVPAN NASAL SWAB   Corrected   Comment: CORRECTED ON 09/11 AT 1921: PREVIOUSLY REPORTED AS NASAL SWAB   Respiratory Syncytial Virus A NOT DETECTED   Final   Respiratory Syncytial Virus B NOT DETECTED   Final   Influenza A NOT  DETECTED   Final   Influenza B NOT DETECTED   Final   Parainfluenza 1 NOT DETECTED   Final   Parainfluenza 2 NOT DETECTED   Final   Parainfluenza 3 NOT DETECTED   Final   Metapneumovirus NOT DETECTED   Final   Rhinovirus NOT DETECTED   Final   Adenovirus NOT DETECTED   Final   Influenza A H1 NOT DETECTED   Final   Influenza A H3 NOT DETECTED   Final   Comment: (NOTE)           Normal Reference Range for each Analyte: NOT DETECTED     Testing performed using the Luminex xTAG Respiratory Viral Panel test     kit.     The analytical performance characteristics of this assay have been     determined by [facility].  The modifications have not been     cleared or approved by the FDA. This assay has been validated pursuant     to the CLIA regulations and is used for clinical purposes.     Performed at [facility] CULTURE     Status: None   Collection  Time    11/22/13  1:22 PM      Result Value Ref Range Status   Specimen Description PLEURAL FLUID LEFT   Final   Special Requests 6.0ML FLUID   Final   Gram Stain     Final   Value: RARE WBC PRESENT,BOTH PMN AND MONONUCLEAR     NO ORGANISMS SEEN     Performed at [facility]   Culture PENDING   Incomplete   Report Status PENDING   Incomplete    Anti-infectives   Start     Dose/Rate Route Frequency Ordered Stop   11/23/13 1330  micafungin (MYCAMINE) 100 mg in sodium chloride 0.9 % 100 mL IVPB     100 mg 100 mL/hr over 1 Hours Intravenous Daily 11/23/13 1322     11/23/13 1330  micafungin (MYCAMINE) 100 mg in sodium chloride 0.9 % 100 mL IVPB  Status:  Discontinued     100 mg 100 mL/hr over 1 Hours Intravenous Daily 11/23/13 1322 11/23/13 1325   11/23/13 0300  fluconazole (DIFLUCAN) IVPB 400 mg  Status:  Discontinued     400 mg 100 mL/hr over 120 Minutes Intravenous Daily 11/23/13 0212 11/23/13 1322   11/22/13 1200  metroNIDAZOLE (FLAGYL) IVPB 500 mg  Status:  Discontinued     500 mg 100 mL/hr over 60 Minutes Intravenous 4 times per day 11/22/13 1016 11/23/13 1322   11/22/13 0600  imipenem-cilastatin (PRIMAXIN) 500 mg in sodium chloride 0.9 % 100 mL IVPB     500 mg 200 mL/hr over 30 Minutes Intravenous 3 times per day 11/22/13 0513     11/22/13 0500  vancomycin (VANCOCIN) IVPB 750 mg/150 ml premix  Status:  Discontinued     750 mg 150 mL/hr over 60 Minutes Intravenous Every 12 hours 11/15/2013 1820 11/22/13 1019   11/17/2013 1400  ceFEPIme (MAXIPIME) 1 g in dextrose 5 % 50 mL IVPB  Status:  Discontinued     1 g 100 mL/hr over 30 Minutes Intravenous Every 12 hours 12/09/2013 1331 11/22/13 0513   12/06/2013 1400  vancomycin (VANCOCIN) 1,750 mg in sodium chloride 0.9 % 500 mL IVPB     1,750 mg 250 mL/hr over 120 Minutes Intravenous  Once 11/17/2013 1351 12/05/2013 1708   12/02/2013 1315  vancomycin (VANCOCIN) 20 mg/kg  in sodium chloride 0.9 % 100 mL IVPB  Status:  Discontinued     20  mg/kg 100 mL/hr over 60 Minutes Intravenous  Once 11/13/2013 1312 12/02/2013 1350      Assessment: 63 YOM with non-small cell lung cancer brought in and found to be septic, started on empiric antibiotics. 9/10 Resp Virus: neg  9/10 Sputum: sent 9/10 C-dif : Neg  9/10 Blood: 1/2 GNR, 1/2 Yeast   Imipenem 9/11>> Diflucan 9/11>>9/12  Mycofungin 9/12>> Flagyl 9/11>>9/12  WBC dwon to 11.9, Tmax 99.5. He may need his port removed. SCr improved to 0.99 with est CrCL ~53m/min.  Goal of Therapy:  Eradication of infection Correct dosing based on hepatic and renal function  Plan:  1. With improvement in renal function, increase Primaxin to 5058mIV q6h 2. Continue micafungin 10041mV q24h as ordered by MD 3. Follow c/s, clinical picture, renal function  Mariyana Fulop D. Yaron Grasse, PharmD, BCPS Clinical Pharmacist Pager: [phone]/2015 1:44 PM

## 2013-11-23 NOTE — Progress Notes (Signed)
Patient having nausea and vomiting that is not relieved by Zofran or Reglan. Abd is slightly more distended then when previously assessed. Emesis is dark green in color. Dr. Chase Caller notified and order obtained for KUB. Wife at bedside. Will continue to monitor.

## 2013-11-23 NOTE — Progress Notes (Signed)
Pt has been keeping oxygen saturations around 88% on 5L via [place].  Discussed with Dr. Titus Mould patient's condition.  Dr. Titus Mould is satisfied with an oxygen sat of 88% given patient's terminal condition.  Will continue to monitor.

## 2013-11-23 NOTE — Progress Notes (Signed)
NG tube placed into left nare. Immediate flow of brown gastric contents noted. Placement verified. NG tube secured and placed to low intermittent wall suction. Patient tolerated well. Abd distention slightly improving. Patient voiced feeling some relief. Call bell in reach. Will continue to monitor.

## 2013-11-23 NOTE — Progress Notes (Signed)
Upon enterimg patients room patient was on a 8LNC. Patients sats were 65. Placed patient on NRB and sats were 87. Will continue to monitor. Informed nurse to contact me if needed.

## 2013-11-23 NOTE — Progress Notes (Signed)
ANTIBIOTIC CONSULT NOTE - INITIAL  Pharmacy Consult for fluconazole  Indication: + blood cx's   Allergies  Allergen Reactions  . Codeine Other (See Comments)    Made pt unrepsonsive  . Penicillins Other (See Comments)    unknown    Patient Measurements: Height: 5' 10.5" (179.1 cm) Weight: 204 lb 12.9 oz (92.9 kg) IBW/kg (Calculated) : 74.15 Adjusted Body Weight:   Vital Signs: Temp: 99.5 F (37.5 C) (09/12 0100) Temp src: Core (Comment) (09/12 0100) BP: 108/51 mmHg (09/12 0100) Pulse Rate: 109 (09/12 0100) Intake/Output from previous day: 09/11 0701 - 09/12 0700 In: 2706.3 [I.V.:2206.3; IV Piggyback:500] Out: 4240 [Urine:2040; Emesis/NG output:200] Intake/Output from this shift: Total I/O In: 900 [I.V.:700; IV Piggyback:200] Out: 825 [Urine:625; Emesis/NG output:200]  Labs:  Recent Labs  11/16/2013 1248 11/25/2013 1251 11/22/13 0320  WBC 7.0  --  34.1*  HGB 8.9* 10.2* 8.1*  PLT 184  --  257  CREATININE 1.11 1.20 1.17   Estimated Creatinine Clearance: 62.1 ml/min (by C-G formula based on Cr of 1.17). No results found for this basename: VANCOTROUGH, VANCOPEAK, VANCORANDOM, [place], GENTPEAK, GENTRANDOM, TOBRATROUGH, TOBRAPEAK, TOBRARND, AMIKACINPEAK, AMIKACINTROU, AMIKACIN,  in the last 72 hours   Microbiology: Recent Results (from the past 720 hour(s))  CULTURE, BLOOD (ROUTINE X 2)     Status: None   Collection Time    11/15/2013  1:44 PM      Result Value Ref Range Status   Specimen Description BLOOD RIGHT CHEST PORTA CATH   Final   Special Requests BOTTLES DRAWN AEROBIC AND ANAEROBIC 10CC   Final   Culture  Setup Time     Final   Value: 11/25/2013 17:30     Performed at [facility]   Culture     Final   Value: GRAM NEGATIVE RODS     Note: Gram Stain Report Called to,Read Back By and Verified With:  [place] PERKINS @0500  ON 11/22/13 SMIAS     Performed at [facility]   Report Status PENDING   Incomplete  CLOSTRIDIUM DIFFICILE BY PCR      Status: None   Collection Time    12/04/2013  2:55 PM      Result Value Ref Range Status   C difficile by pcr NEGATIVE  NEGATIVE Final  CULTURE, BLOOD (ROUTINE X 2)     Status: None   Collection Time    12/01/2013  3:40 PM      Result Value Ref Range Status   Specimen Description BLOOD LEFT HAND   Final   Special Requests BOTTLES DRAWN AEROBIC AND ANAEROBIC 10CC   Final   Culture  Setup Time     Final   Value: 11/18/2013 20:19     Performed at [facility]   Culture     Final   Value:        BLOOD CULTURE RECEIVED NO GROWTH TO DATE CULTURE WILL BE HELD FOR 5 DAYS BEFORE ISSUING A FINAL NEGATIVE REPORT     Performed at [facility]   Report Status PENDING   Incomplete  RESPIRATORY VIRUS PANEL     Status: None   Collection Time    11/16/2013  6:20 PM      Result Value Ref Range Status   Source - RVPAN NASAL SWAB   Corrected   Comment: CORRECTED ON 09/11 AT 1921: PREVIOUSLY REPORTED AS NASAL SWAB   Respiratory Syncytial Virus A NOT DETECTED   Final   Respiratory Syncytial Virus B  NOT DETECTED   Final   Influenza A NOT DETECTED   Final   Influenza B NOT DETECTED   Final   Parainfluenza 1 NOT DETECTED   Final   Parainfluenza 2 NOT DETECTED   Final   Parainfluenza 3 NOT DETECTED   Final   Metapneumovirus NOT DETECTED   Final   Rhinovirus NOT DETECTED   Final   Adenovirus NOT DETECTED   Final   Influenza A H1 NOT DETECTED   Final   Influenza A H3 NOT DETECTED   Final   Comment: (NOTE)           Normal Reference Range for each Analyte: NOT DETECTED     Testing performed using the Luminex xTAG Respiratory Viral Panel test     kit.     The analytical performance characteristics of this assay have been     determined by [facility].  The modifications have not been     cleared or approved by the FDA. This assay has been validated pursuant     to the CLIA regulations and is used for clinical purposes.     Performed at [place] FLUID CULTURE      Status: None   Collection Time    11/22/13  1:22 PM      Result Value Ref Range Status   Specimen Description PLEURAL FLUID LEFT   Final   Special Requests 6.0ML FLUID   Final   Gram Stain     Final   Value: RARE WBC PRESENT,BOTH PMN AND MONONUCLEAR     NO ORGANISMS SEEN     Performed at [facility]   Culture PENDING   Incomplete   Report Status PENDING   Incomplete    Medical History: Past Medical History  Diagnosis Date  . Diabetes mellitus   . HTN (hypertension)   . High cholesterol   . Hx of radiation therapy 09/22/10 - 11/05/10    LLL lung  . History of chemotherapy   . History of radiation therapy 04/24/12-05/15/12    lllung 35Gy/14dx  . Lung cancer 2012    recurrence 12/2011    Medications:  Prescriptions prior to admission  Medication Sig Dispense Refill  . acetaminophen (TYLENOL) 500 MG tablet Take 500 mg by mouth every 6 (six) hours as needed for mild pain or headache.       . albuterol (PROVENTIL) (5 MG/ML) 0.5% nebulizer solution Take 0.5 mLs (2.5 mg total) by nebulization every 4 (four) hours as needed for wheezing.  20 mL  3  . atorvastatin (LIPITOR) 20 MG tablet Take 20 mg by mouth daily.      Marland Kitchen glimepiride (AMARYL) 4 MG tablet Take 8 mg by mouth daily.       Marland Kitchen lactose free nutrition (BOOST PLUS) LIQD Take 237 mLs by mouth 2 (two) times daily as needed.      . latanoprost (XALATAN) 0.005 % ophthalmic solution Place 1 drop into both eyes at bedtime.       Marland Kitchen loperamide (IMODIUM) 2 MG capsule Take 2 mg by mouth 4 (four) times daily as needed for diarrhea or loose stools.      . magnesium oxide (MAG-OX) 400 (241.3 MG) MG tablet Take 1 tablet (400 mg total) by mouth daily.  30 tablet  1  . ondansetron (ZOFRAN) 8 MG tablet Take 8 mg by mouth every 8 (eight) hours as needed for nausea or vomiting.      Marland Kitchen  pioglitazone (ACTOS) 30 MG tablet Take 30 mg by mouth daily.       Marland Kitchen tiotropium (SPIRIVA HANDIHALER) 18 MCG inhalation capsule Place 1 capsule (18 mcg total)  into inhaler and inhale daily.  30 capsule  6  . traMADol (ULTRAM) 50 MG tablet Take 50 mg by mouth every 6 (six) hours as needed (pain).       Assessment: Fluconazole for +fungal stain (candidemia likely) . Pt is a cancer patient. Not on azole PTA. And is not neutropenic. Appropriate to begin an azole.   Goal of Therapy:  Full dose fluconazole x21 days. (can convert to po at day 8)   Plan:  Fluconazole 400 mg iv qd x21 days.   Curlene Dolphin 11/23/2013,2:05 AM

## 2013-11-24 DIAGNOSIS — B49 Unspecified mycosis: Secondary | ICD-10-CM

## 2013-11-24 DIAGNOSIS — J9610 Chronic respiratory failure, unspecified whether with hypoxia or hypercapnia: Secondary | ICD-10-CM

## 2013-11-24 DIAGNOSIS — R0902 Hypoxemia: Secondary | ICD-10-CM

## 2013-11-24 DIAGNOSIS — J90 Pleural effusion, not elsewhere classified: Secondary | ICD-10-CM

## 2013-11-24 LAB — GLUCOSE, CAPILLARY
GLUCOSE-CAPILLARY: 115 mg/dL — AB (ref 70–99)
Glucose-Capillary: 168 mg/dL — ABNORMAL HIGH (ref 70–99)
Glucose-Capillary: 169 mg/dL — ABNORMAL HIGH (ref 70–99)
Glucose-Capillary: 125 mg/dL — ABNORMAL HIGH (ref 70–99)

## 2013-11-24 MED ORDER — MORPHINE SULFATE 2 MG/ML IJ SOLN
2.0000 mg | INTRAMUSCULAR | Status: DC | PRN
  Administered 2013-11-24: 2 mg via INTRAVENOUS
  Filled 2013-11-24: qty 1

## 2013-11-24 MED ORDER — FENTANYL 25 MCG/HR TD PT72
25.0000 ug | MEDICATED_PATCH | TRANSDERMAL | Status: DC
Start: 2013-11-24 — End: 2013-11-26
  Administered 2013-11-24: 25 ug via TRANSDERMAL
  Filled 2013-11-24: qty 1

## 2013-11-24 MED ORDER — METOCLOPRAMIDE HCL 5 MG/ML IJ SOLN
10.0000 mg | Freq: Four times a day (QID) | INTRAMUSCULAR | Status: DC
  Administered 2013-11-24 – 2013-11-26 (×7): 10 mg via INTRAVENOUS
  Filled 2013-11-24 (×12): qty 2

## 2013-11-24 NOTE — Progress Notes (Signed)
Dr. Melvyn Novas at the beside and will keep patient on non rebreather,   Stats are currently 79-80%. Md writing order for plan. Will continue to monitor.

## 2013-11-24 NOTE — Progress Notes (Signed)
Pt continue to have SOB when lay down - on call physician paged at 2130 - no return call. Paged again at 2200 - no return. Paged at 2230 - on call physician order 30 mg additional IV lasix. Pt able to lay down in bed without s/s of distress but occasionally sits on side of bed to assist with breathing.

## 2013-11-24 NOTE — Plan of Care (Signed)
Problem: Phase I Progression Outcomes Goal: OOB as tolerated unless otherwise ordered Outcome: Not Progressing Pt becomes too SOB to ambulate safely. Goal: Voiding-avoid urinary catheter unless indicated Outcome: Not Progressing Pt has foley catheter  Problem: Phase II Progression Outcomes Goal: Vital signs remain stable Outcome: Not Progressing Oxygen saturations between 70-80% on non-rebreather

## 2013-11-24 NOTE — Progress Notes (Signed)
Name: [name]. MRN: [id number] DOB: 10-21-1935    ADMISSION DATE:  11/29/2013 CONSULTATION DATE:  11/20/2013  REFERRING MD :  Marinda Elk PRIMARY SERVICE:  [facility]  CHIEF COMPLAINT:  AMS  BRIEF PATIENT DESCRIPTION: 78 yo male with stage IIIB/IV non-small cell lung cancer and GOLD C COPD (PW patient) presented to ED after syncopal episode at home. Initially thought to have possibly had a stroke, further workup revealed more of a infectious type picture. CXR noted new mod-large L pleural effusion. PCCM consulted for further eval.   SIGNIFICANT EVENTS / STUDIES:  9/10 CT head > No acute intracranial process, L mastoid effusion 9/10 CTA Chest > No PE, severe emphysematous changes bilat, large L effusion, Persistent mass LLL w/ associated ATX 9/11 Emergent  thoracentesis>>> 2L clear fluid, protein 3.3, LDH 106, wbc 481,   improved status  LINES / TUBES: PIV   SUBJECTIVE:   Looks miserable sitting in tripod position with mild sob, no cp or n or v, wants tubes out .    VITAL SIGNS: Temp:  [97.5 F (36.4 C)-98.1 F (36.7 C)] 97.5 F (36.4 C) (09/13 0533) Pulse Rate:  [99-120] 120 (09/13 0850) Resp:  [18-32] 18 (09/13 0831) BP: (100-130)/(49-64) 121/49 mmHg (09/13 0850) SpO2:  [80 %-93 %] 80 % (09/13 0850) Weight:  [196 lb 6.4 oz (89.086 kg)] 196 lb 6.4 oz (89.086 kg) (09/13 0533)  PHYSICAL EXAMINATION: General:  Chronically and acutely ill appearing Neuro:  Alert, oriented x 3 HEENT: mm dry, no JVD, ng tube  Cardiovascular: s1 s2 RRR Lungs:  resps even, mildly tachypneic, diminished bases - no wheeze  Abdomen:  Soft, non-tender, non-distended Musculoskeletal:  No acute deformity or ROM limitation Skin:  Intact, ecchymosis to BUE. Port-a-cath in place R anterior chest wall.no drainage   Recent Labs Lab 11/20/2013 1248 11/12/2013 1251 11/22/13 0320 11/23/13 0530  NA 139 137 137 143  K 3.7 3.5* 3.6* 3.6*  CL 97 100 102 109  CO2 19  --  17* 24  BUN 12 11 15 18   CREATININE  1.11 1.20 1.17 0.99  GLUCOSE 83 87 213* 108*    Recent Labs Lab 11/29/2013 1248 12/01/2013 1251 11/22/13 0320 11/23/13 0530  HGB 8.9* 10.2* 8.1* 7.6*  HCT 28.3* 30.0* 25.7* 23.5*  WBC 7.0  --  34.1* 11.9*  PLT 184  --  257 112*   Dg Chest Port 1 View  11/23/2013   CLINICAL DATA:  Evaluate lung status post left thoracentesis  EXAM: PORTABLE CHEST - 1 VIEW  COMPARISON:  11/22/2013  FINDINGS: Port-A-Cath, left central line, stable. Interval placement of NG tube which crosses the gastroesophageal junction.  Opacity in the left lower lobe representing a combination of pleural effusion and underlying consolidation, without significant interval change. Mild interstitial change central right lung, stable.  IMPRESSION: No significant change from 11/22/2013 other than status post placement of nasogastric tube.   Electronically Signed   By: Skipper Cliche M.D.   On: 11/23/2013 07:55   Dg Chest Port 1 View  11/22/2013   CLINICAL DATA:  Status post left thoracentesis  EXAM: PORTABLE CHEST - 1 VIEW  COMPARISON:  Film from earlier in the same day  FINDINGS: Cardiac shadow is stable. Decrease in the degree of left pleural effusion is noted. No pneumothorax is seen. A right-sided chest wall port and left jugular central line are again seen and stable. Mild interstitial changes are again noted in the right lung. No new acute abnormality is seen.  IMPRESSION: Significant reduction in left-sided pleural effusion without pneumothorax.   Electronically Signed   By: Inez Catalina M.D.   On: 11/22/2013 13:25   Dg Abd Portable 1v  11/23/2013   CLINICAL DATA:  Nausea and vomiting.  EXAM: PORTABLE ABDOMEN - 1 VIEW  COMPARISON:  11/08/2013.  FINDINGS: Moderate gaseous distention of the stomach is noted. No findings for small bowel obstruction or free air.  IMPRESSION: Moderate gaseous distention of the stomach.   Electronically Signed   By: Kalman Jewels M.D.   On: 11/23/2013 01:12    ASSESSMENT /  PLAN:  PULMONARY A: Acute hypoxic respiratory failure in setting of L effusion and post obstructive ATX (mass) vs HCAP Large left pleural effusion- note last thora from 6/30 demonstrated reactive mesothelial cells and chronic inflammation. NSCLC s/p radiation and undergoing chemo ? Post obstructive pneumonitis H/o GOLD C - COPD without evidence of acute exacerbation.  DNI Status P:   Supplemental O2 as needed for comfort but no need to monitor sats S/p thoracentesis 9/11 with improvement in symptoms but very poor gas exchange continues  Continue abx as per ID section. Cont BD   Follow cytology, if pos and survivable admission - pleurex for symptom control if needed   CARDIOVASCULAR Right chest Port A Cath >>> 9/10 IJ>>> A:  Septic Shock Hx HTN Hx HLD DNR Status P:  Off pressors     RENAL A:  AG metabolic acidosis - lactate. Improved.  P:   NS@75  to kvo  s   GASTROINTESTINAL A:  Nausea, vomiting.  ?ileus  P:    try IV reglan/ tube out am 9/13 at pt's request   HEMATOLOGIC A:  VTE prophylaxis P:  Lovenox in setting cancer / SCD's. CBC in AM.  INFECTIOUS A:   Septic Shock - in setting of large left pleural effusion with possible post obstructive HCAP Doubt empyema  Gram neg bacteremia, may need port dc P:   Resp Virus 9/10 >>>neg  C-dif 9/10 > Neg Blood 9/10 >>>1/2 GNR,  1/2 Yeast>>> Flagyl 9/11>>>9/12 Diflucan 9/11>>>9/12 Imipenem 9/11>>> Mycofungin 9/12>>>   Per ID    ENDOCRINE A:  DM R/o rel AI - baseline cortisol 35 9/11 P:   Monitor CBG's. SSI for CBG > 180. D/c stress steroids 9/13     NEUROLOGIC A:   Acute encephalopathy - resolved P:   Monitor avoid benzo    TODAY'S SUMMARY:    Doing poorly on floor in terms of "numbers" with poor gas exchange but reasonable work of breathing  Pt already DNR status and he and wife agreeable to shift to comfort based care while trying to treat the underlying infection so will d/c the monitoring  and start duragesic and double until adequate palliation/ rx abx/antifungals per ID     Christinia Gully, MD Pulmonary and [place] [phone] After 5:30 PM or weekends, call [phone]

## 2013-11-24 NOTE — Significant Event (Signed)
Rapid Response Event Note  Overview: Time Called: 0822 Arrival Time: 0830 Event Type: Respiratory  Initial Focused Assessment: Called by primary RN for Respiratory distress.  As per RN patients sats 80's on NRB.  Upon my arrival to patients room Rn and family at bedside.  Patient sitting on side of bed with NRB on.  Patients color dusky.  Breath Sounds clear and diminished.     Interventions:  Md paged and updated.  Respiratory at bedside administering treatment.  BP 121/85, HR 120, 80%.  Dr Melvyn Novas at bedside, goal id for comfort   Event Summary:  Rn to call if assistance needed   at      at          [facility]

## 2013-11-24 NOTE — Progress Notes (Addendum)
Checked on pt to see how pt was doing regarding oxygenation. Pt was lying on his back trying to rest stated "I feel a little better."  Checked oxygen saturations and showed 73%.  Called respiratory therapy to give a duoneb treatment and requested charge nurse to come to room.  Pt denied any SOB, respirations appeared labored, but not tachypnic.  Called rapid response and notified critical care pulmonology.  Dr. Melvyn Novas to come see pt, but stated "Pt did not want to be on a ventilator and he is not a candidate for BiPap.  Pt was moved to sit on the side of the bed since it helped with his breathing.  Charge nurse to stay at bedside with rapid response.  Will continue to monitor pt.

## 2013-11-24 NOTE — Progress Notes (Signed)
Pt experiencing SOB and difficulty breathing. Respiratory called into the room - O2 sats in 60's. Rapid response called. On call physician called. Pt placed on non-rebreather. Scheduled IV lasix admin. Pt sat on side of bed. Pt's O2 sats come up to high 80's - pt's baseline previously during the day. Pt in less s/s of distress.

## 2013-11-24 NOTE — Progress Notes (Signed)
Patient ID: [name]., male   DOB: [birth year], 78 y.o.   MRN: [id number]         [place] for Infectious Disease    Date of Admission:  11/20/2013   Total days of antibiotics 4        Day 3 imipenem        Day 2 micafungin         Principal Problem:   Severe sepsis with septic shock Active Problems:   Fungemia   Bacteremia due to Gram-negative bacteria   Lung cancer   Diabetes   DNR (do not resuscitate)   Antineoplastic chemotherapy induced anemia(285.3)   Diarrhea   Pneumonia   Recurrent left pleural effusion   Hematuria   Long QT interval   . antiseptic oral rinse  7 mL Mouth Rinse BID  . aspirin  325 mg Oral Daily  . enoxaparin (LOVENOX) injection  40 mg Subcutaneous Q24H  . fentaNYL  25 mcg Transdermal Q72H  . imipenem-cilastatin  500 mg Intravenous Q6H  . insulin aspart  0-15 Units Subcutaneous TID WC  . ipratropium-albuterol  3 mL Nebulization Q6H  . latanoprost  1 drop Both Eyes QHS  . metoCLOPramide (REGLAN) injection  10 mg Intravenous 4 times per day  . micafungin [facility]) IV  100 mg Intravenous Daily  . sodium chloride  3 mL Intravenous Q12H    Subjective: He developed sudden, severe dyspnea this morning when sitting up in bed. His wife is noted that his cough has worsened today but is nonproductive. The rapid response team was called and his O2 sats were noted to be in the low 80s this morning. He has been somewhat more comfortable through the day on a 100% nonrebreather mask.  Objective: Temp:  [97.5 F (36.4 C)-98.1 F (36.7 C)] 97.5 F (36.4 C) (09/13 0533) Pulse Rate:  [102-120] 120 (09/13 0850) Resp:  [18-32] 18 (09/13 0831) BP: (102-130)/(49-64) 121/49 mmHg (09/13 0850) SpO2:  [80 %-92 %] 80 % (09/13 1413) FiO2 (%):  [100 %] 100 % (09/13 1413) Weight:  [196 lb 6.4 oz (89.086 kg)] 196 lb 6.4 oz (89.086 kg) (09/13 0533)  General: He is sitting up on the side of his bed surrounded by family. He looks extremely tired and worse than  yesterday. He has mild increase work of breathing Lungs: Bilateral crackles Cor: Distant heart sounds Chest: Port-A-Cath site appears normal Extremities: No pedal edema  Lab Results Lab Results  Component Value Date   WBC 11.9* 11/23/2013   HGB 7.6* 11/23/2013   HCT 23.5* 11/23/2013   MCV 89.4 11/23/2013   PLT 112* 11/23/2013    Lab Results  Component Value Date   CREATININE 0.99 11/23/2013   BUN 18 11/23/2013   NA 143 11/23/2013   K 3.6* 11/23/2013   CL 109 11/23/2013   CO2 24 11/23/2013    Lab Results  Component Value Date   ALT 152* 11/23/2013   AST 226* 11/23/2013   ALKPHOS 87 11/23/2013   BILITOT 0.5 11/23/2013      Microbiology: Recent Results (from the past 240 hour(s))  CULTURE, BLOOD (ROUTINE X 2)     Status: None   Collection Time    11/17/2013  1:44 PM      Result Value Ref Range Status   Specimen Description BLOOD RIGHT CHEST PORTA CATH   Final   Special Requests BOTTLES DRAWN AEROBIC AND ANAEROBIC 10CC   Final   Culture  Setup Time  Final   Value: 11/24/2013 17:30     Performed at [facility]   Culture     Final   Value: GRAM NEGATIVE RODS     ESCHERICHIA COLI     Note: Gram Stain Report Called to,Read Back By and Verified With:  [place] PERKINS @0500  ON 11/22/13 SMIAS     Performed at [facility]   Report Status PENDING   Incomplete  CLOSTRIDIUM DIFFICILE BY PCR     Status: None   Collection Time    11/15/2013  2:55 PM      Result Value Ref Range Status   C difficile by pcr NEGATIVE  NEGATIVE Final  CULTURE, BLOOD (ROUTINE X 2)     Status: None   Collection Time    12/09/2013  3:40 PM      Result Value Ref Range Status   Specimen Description BLOOD LEFT HAND   Final   Special Requests BOTTLES DRAWN AEROBIC AND ANAEROBIC 10CC   Final   Culture  Setup Time     Final   Value: 12/10/2013 20:19     Performed at [facility]   Culture     Final   Value: GRAM NEGATIVE RODS     YEAST     Note: Gram Stain Report Called to,Read Back By and  Verified With: [place] @0150  11/23/13 SMIAS Gram Stain Report Called to,Read Back By and Verified With: JENNIFER ZHU 11/24/13 @ 122PM BY RUSCOE A.     Performed at [facility]   Report Status PENDING   Incomplete  RESPIRATORY VIRUS PANEL     Status: None   Collection Time    11/19/2013  6:20 PM      Result Value Ref Range Status   Source - RVPAN NASAL SWAB   Corrected   Comment: CORRECTED ON 09/11 AT 1921: PREVIOUSLY REPORTED AS NASAL SWAB   Respiratory Syncytial Virus A NOT DETECTED   Final   Respiratory Syncytial Virus B NOT DETECTED   Final   Influenza A NOT DETECTED   Final   Influenza B NOT DETECTED   Final   Parainfluenza 1 NOT DETECTED   Final   Parainfluenza 2 NOT DETECTED   Final   Parainfluenza 3 NOT DETECTED   Final   Metapneumovirus NOT DETECTED   Final   Rhinovirus NOT DETECTED   Final   Adenovirus NOT DETECTED   Final   Influenza A H1 NOT DETECTED   Final   Influenza A H3 NOT DETECTED   Final   Comment: (NOTE)           Normal Reference Range for each Analyte: NOT DETECTED     Testing performed using the Luminex xTAG Respiratory Viral Panel test     kit.     The analytical performance characteristics of this assay have been     determined by [facility].  The modifications have not been     cleared or approved by the FDA. This assay has been validated pursuant     to the CLIA regulations and is used for clinical purposes.     Performed at [facility]  AFB CULTURE WITH SMEAR     Status: None   Collection Time    11/22/13  1:22 PM      Result Value Ref Range Status   Specimen Description PLEURAL FLUID LEFT   Final   Special Requests 6.0ML FLUID   Final   Acid Fast  Smear     Final   Value: NO ACID FAST BACILLI SEEN     Performed at [facility]   Culture     Final   Value: CULTURE WILL BE EXAMINED FOR 6 WEEKS BEFORE ISSUING A FINAL REPORT     Performed at [facility]   Report Status PENDING   Incomplete  BODY FLUID  CULTURE     Status: None   Collection Time    11/22/13  1:22 PM      Result Value Ref Range Status   Specimen Description PLEURAL FLUID LEFT   Final   Special Requests 6.0ML FLUID   Final   Gram Stain     Final   Value: RARE WBC PRESENT,BOTH PMN AND MONONUCLEAR     NO ORGANISMS SEEN     Performed at [facility]   Culture     Final   Value: NO GROWTH 1 DAY     Performed at [facility]   Report Status PENDING   Incomplete  FUNGAL STAIN     Status: None   Collection Time    11/22/13  1:23 PM      Result Value Ref Range Status   Specimen Description PLEURAL FLUID LEFT   Final   Special Requests 6.0ML FLUID   Final   Fungal Smear     Final   Value: NO YEAST OR FUNGAL ELEMENTS SEEN     Performed at [facility]   Report Status 11/23/2013 FINAL   Final    Studies/Results: Dg Chest Port 1 View  11/23/2013   CLINICAL DATA:  Evaluate lung status post left thoracentesis  EXAM: PORTABLE CHEST - 1 VIEW  COMPARISON:  11/22/2013  FINDINGS: Port-A-Cath, left central line, stable. Interval placement of NG tube which crosses the gastroesophageal junction.  Opacity in the left lower lobe representing a combination of pleural effusion and underlying consolidation, without significant interval change. Mild interstitial change central right lung, stable.  IMPRESSION: No significant change from 11/22/2013 other than status post placement of nasogastric tube.   Electronically Signed   By: Skipper Cliche M.D.   On: 11/23/2013 07:55   Dg Abd Portable 1v  11/23/2013   CLINICAL DATA:  Nausea and vomiting.  EXAM: PORTABLE ABDOMEN - 1 VIEW  COMPARISON:  11/08/2013.  FINDINGS: Moderate gaseous distention of the stomach is noted. No findings for small bowel obstruction or free air.  IMPRESSION: Moderate gaseous distention of the stomach.   Electronically Signed   By: Kalman Jewels M.D.   On: 11/23/2013 01:12    Assessment: [name] has yeast and 2 gram-negative rods growing from his  admission blood cultures. He was much improved yesterday but this morning developed worsening hypoxia is probably multifactorial. He has family would like to continue antimicrobial therapy for now. I will continue imipenem and micafungin pending final blood culture results and repeat blood cultures today. As noted below, the infectious disease Society guidelines support removal of his Port-A-Cath, particularly with fungemia. However, with his poor overall prognosis and sudden worsening this morning I would recommend postponing that decision for now.    Plan: 1. Continue imipenem and micafungin 2. Repeat blood cultures  Michel Bickers, MD [facility] for Infectious [place] [phone] pager   [phone] cell 11/24/2013, 2:35 PM

## 2013-11-24 NOTE — Progress Notes (Signed)
Pt and pt's wife refuse the bedalarm. Pt's wife that sometimes he can't lay in the bed and has to sit on the side of the bed. Pt states that he will call before getting up. Pt's wife states that he always wakes her up when he needs to get up. Will continue to monitor pt. Ranelle Oyster, RN

## 2013-11-24 NOTE — Progress Notes (Signed)
RT Note: Rt was called to patients room by his RN to administer a breathing treatment. Patient is saturating 70% on a non rebreather currently. Rapid response is at bedside and patients MD has been called by RN and is en route to assess patient. Patients oxygen dropped during breathing treatment to the 60's but has improved after being placed back on the non rebreather post nebulizer and he is now saturating 80%. Patient does not appear to be in any acute distress currently and is not complaining of feeling short of breath. Per patients RN, he does not want Bipap. RT is awaiting further orders from MD. Rt will continue to monitor.

## 2013-11-24 NOTE — Progress Notes (Signed)
Respiratory at bedside to administer a breathing treatment.  Patient sitting on the side of the bed  Stated he fells mildly sob.  Patient  sats during breathing treatment where in 50's and return to 79% after her was placed back on non re breather for a few minutes

## 2013-11-25 DIAGNOSIS — C3490 Malignant neoplasm of unspecified part of unspecified bronchus or lung: Secondary | ICD-10-CM

## 2013-11-25 DIAGNOSIS — R7881 Bacteremia: Secondary | ICD-10-CM

## 2013-11-25 DIAGNOSIS — R0609 Other forms of dyspnea: Secondary | ICD-10-CM

## 2013-11-25 DIAGNOSIS — J449 Chronic obstructive pulmonary disease, unspecified: Secondary | ICD-10-CM

## 2013-11-25 DIAGNOSIS — R0989 Other specified symptoms and signs involving the circulatory and respiratory systems: Secondary | ICD-10-CM

## 2013-11-25 DIAGNOSIS — B9689 Other specified bacterial agents as the cause of diseases classified elsewhere: Secondary | ICD-10-CM

## 2013-11-25 LAB — CULTURE, BLOOD (ROUTINE X 2)

## 2013-11-25 LAB — GLUCOSE, CAPILLARY
Glucose-Capillary: 156 mg/dL — ABNORMAL HIGH (ref 70–99)
Glucose-Capillary: 161 mg/dL — ABNORMAL HIGH (ref 70–99)
Glucose-Capillary: 114 mg/dL — ABNORMAL HIGH (ref 70–99)
Glucose-Capillary: 111 mg/dL — ABNORMAL HIGH (ref 70–99)

## 2013-11-25 LAB — BODY FLUID CULTURE: Culture: NO GROWTH

## 2013-11-25 MED ORDER — MORPHINE SULFATE (CONCENTRATE) 10 MG /0.5 ML PO SOLN
5.0000 mg | ORAL | Status: DC | PRN
Start: 2013-11-25 — End: 2013-11-26
  Administered 2013-11-25: 5 mg via ORAL
  Filled 2013-11-25: qty 0.5

## 2013-11-25 MED ORDER — SODIUM CHLORIDE 0.9 % IV SOLN
100.0000 mg | INTRAVENOUS | Status: DC
  Administered 2013-11-25: 100 mg via INTRAVENOUS
  Filled 2013-11-25: qty 100

## 2013-11-25 MED ORDER — FLUCONAZOLE IN SODIUM CHLORIDE 400-0.9 MG/200ML-% IV SOLN
400.0000 mg | INTRAVENOUS | Status: DC
  Filled 2013-11-25: qty 200

## 2013-11-25 NOTE — Progress Notes (Addendum)
INFECTIOUS DISEASE PROGRESS NOTE  ID: [name]. is a 78 y.o. male with  Principal Problem:   Severe sepsis with septic shock Active Problems:   Lung cancer   Diabetes   DNR (do not resuscitate)   Antineoplastic chemotherapy induced anemia(285.3)   Diarrhea   Pneumonia   Recurrent left pleural effusion   Hematuria   Long QT interval   Fungemia   Bacteremia due to Gram-negative bacteria  Subjective: More comfortable on O2 mask  Abtx:  Anti-infectives   Start     Dose/Rate Route Frequency Ordered Stop   11/23/13 1400  imipenem-cilastatin (PRIMAXIN) 500 mg in sodium chloride 0.9 % 100 mL IVPB     500 mg 200 mL/hr over 30 Minutes Intravenous Every 6 hours 11/23/13 1344     11/23/13 1330  micafungin (MYCAMINE) 100 mg in sodium chloride 0.9 % 100 mL IVPB     100 mg 100 mL/hr over 1 Hours Intravenous Daily 11/23/13 1322     11/23/13 1330  micafungin (MYCAMINE) 100 mg in sodium chloride 0.9 % 100 mL IVPB  Status:  Discontinued     100 mg 100 mL/hr over 1 Hours Intravenous Daily 11/23/13 1322 11/23/13 1325   11/23/13 0300  fluconazole (DIFLUCAN) IVPB 400 mg  Status:  Discontinued     400 mg 100 mL/hr over 120 Minutes Intravenous Daily 11/23/13 0212 11/23/13 1322   11/22/13 1200  metroNIDAZOLE (FLAGYL) IVPB 500 mg  Status:  Discontinued     500 mg 100 mL/hr over 60 Minutes Intravenous 4 times per day 11/22/13 1016 11/23/13 1322   11/22/13 0600  imipenem-cilastatin (PRIMAXIN) 500 mg in sodium chloride 0.9 % 100 mL IVPB  Status:  Discontinued     500 mg 200 mL/hr over 30 Minutes Intravenous 3 times per day 11/22/13 0513 11/23/13 1344   11/22/13 0500  vancomycin (VANCOCIN) IVPB 750 mg/150 ml premix  Status:  Discontinued     750 mg 150 mL/hr over 60 Minutes Intravenous Every 12 hours 11/20/2013 1820 11/22/13 1019   12/05/2013 1400  ceFEPIme (MAXIPIME) 1 g in dextrose 5 % 50 mL IVPB  Status:  Discontinued     1 g 100 mL/hr over 30 Minutes Intravenous Every 12 hours 11/18/2013  1331 11/22/13 0513   11/17/2013 1400  vancomycin (VANCOCIN) 1,750 mg in sodium chloride 0.9 % 500 mL IVPB     1,750 mg 250 mL/hr over 120 Minutes Intravenous  Once 11/19/2013 1351 11/20/2013 1708   12/06/2013 1315  vancomycin (VANCOCIN) 20 mg/kg in sodium chloride 0.9 % 100 mL IVPB  Status:  Discontinued     20 mg/kg 100 mL/hr over 60 Minutes Intravenous  Once 11/29/2013 1312 11/16/2013 1350      Medications:  Scheduled: . antiseptic oral rinse  7 mL Mouth Rinse BID  . aspirin  325 mg Oral Daily  . enoxaparin (LOVENOX) injection  40 mg Subcutaneous Q24H  . fentaNYL  25 mcg Transdermal Q72H  . imipenem-cilastatin  500 mg Intravenous Q6H  . insulin aspart  0-15 Units Subcutaneous TID WC  . ipratropium-albuterol  3 mL Nebulization Q6H  . latanoprost  1 drop Both Eyes QHS  . metoCLOPramide (REGLAN) injection  10 mg Intravenous 4 times per day  . micafungin [facility]) IV  100 mg Intravenous Daily  . sodium chloride  3 mL Intravenous Q12H    Objective: Vital signs in last 24 hours: Temp:  [97.6 F (36.4 C)-98.4 F (36.9 C)] 97.6 F (36.4 C) (09/14 1107)  Pulse Rate:  [117-125] 121 (09/14 1107) Resp:  [20-22] 20 (09/14 1107) BP: (104-131)/(50-89) 118/77 mmHg (09/14 1107) SpO2:  [52 %-96 %] 84 % (09/14 1135) FiO2 (%):  [100 %] 100 % (09/14 1135)   General appearance: alert, cooperative and mild distress Resp: rhonchi bilaterally Cardio: regular rate and rhythm GI: normal findings: bowel sounds normal and soft, non-tender  Lab Results  Recent Labs  11/23/13 0530  WBC 11.9*  HGB 7.6*  HCT 23.5*  NA 143  K 3.6*  CL 109  CO2 24  BUN 18  CREATININE 0.99   Liver Panel  Recent Labs  11/22/13 1330 11/23/13 0530  PROT 4.7* 5.3*  ALBUMIN  --  1.9*  AST  --  226*  ALT  --  152*  ALKPHOS  --  87  BILITOT  --  0.5   Sedimentation Rate No results found for this basename: ESRSEDRATE,  in the last 72 hours C-Reactive Protein No results found for this basename: CRP,  in the last  72 hours  Microbiology: Recent Results (from the past 240 hour(s))  CULTURE, BLOOD (ROUTINE X 2)     Status: None   Collection Time    12/09/2013  1:44 PM      Result Value Ref Range Status   Specimen Description BLOOD RIGHT CHEST PORTA CATH   Final   Special Requests BOTTLES DRAWN AEROBIC AND ANAEROBIC 10CC   Final   Culture  Setup Time     Final   Value: 11/17/2013 17:30     Performed at [facility]   Culture     Final   Value: KLEBSIELLA PNEUMONIAE     ESCHERICHIA COLI     Note: Gram Stain Report Called to,Read Back By and Verified With:  [place] PERKINS @0500  ON 11/22/13 SMIAS     Performed at [facility]   Report Status 11/25/2013 FINAL   Final   Organism ID, Bacteria KLEBSIELLA PNEUMONIAE   Final   Organism ID, Bacteria ESCHERICHIA COLI   Final  CLOSTRIDIUM DIFFICILE BY PCR     Status: None   Collection Time    11/18/2013  2:55 PM      Result Value Ref Range Status   C difficile by pcr NEGATIVE  NEGATIVE Final  CULTURE, BLOOD (ROUTINE X 2)     Status: None   Collection Time    11/12/2013  3:40 PM      Result Value Ref Range Status   Specimen Description BLOOD LEFT HAND   Final   Special Requests BOTTLES DRAWN AEROBIC AND ANAEROBIC 10CC   Final   Culture  Setup Time     Final   Value: 12/06/2013 20:19     Performed at [facility]   Culture     Final   Value: GRAM NEGATIVE RODS     CANDIDA TROPICALIS     Note: Gram Stain Report Called to,Read Back By and Verified With: [place] @0150  11/23/13 SMIAS Gram Stain Report Called to,Read Back By and Verified With: JENNIFER ZHU 11/24/13 @ 122PM BY RUSCOE A.     Performed at [facility]   Report Status PENDING   Incomplete  RESPIRATORY VIRUS PANEL     Status: None   Collection Time    12/08/2013  6:20 PM      Result Value Ref Range Status   Source - RVPAN NASAL SWAB   Corrected   Comment: CORRECTED ON 09/11 AT 1921: PREVIOUSLY REPORTED AS NASAL  SWAB   Respiratory Syncytial Virus A NOT DETECTED    Final   Respiratory Syncytial Virus B NOT DETECTED   Final   Influenza A NOT DETECTED   Final   Influenza B NOT DETECTED   Final   Parainfluenza 1 NOT DETECTED   Final   Parainfluenza 2 NOT DETECTED   Final   Parainfluenza 3 NOT DETECTED   Final   Metapneumovirus NOT DETECTED   Final   Rhinovirus NOT DETECTED   Final   Adenovirus NOT DETECTED   Final   Influenza A H1 NOT DETECTED   Final   Influenza A H3 NOT DETECTED   Final   Comment: (NOTE)           Normal Reference Range for each Analyte: NOT DETECTED     Testing performed using the Luminex xTAG Respiratory Viral Panel test     kit.     The analytical performance characteristics of this assay have been     determined by [facility].  The modifications have not been     cleared or approved by the FDA. This assay has been validated pursuant     to the CLIA regulations and is used for clinical purposes.     Performed at [facility]  AFB CULTURE WITH SMEAR     Status: None   Collection Time    11/22/13  1:22 PM      Result Value Ref Range Status   Specimen Description PLEURAL FLUID LEFT   Final   Special Requests 6.0ML FLUID   Final   Acid Fast Smear     Final   Value: NO ACID FAST BACILLI SEEN     Performed at [facility]   Culture     Final   Value: CULTURE WILL BE EXAMINED FOR 6 WEEKS BEFORE ISSUING A FINAL REPORT     Performed at [facility]   Report Status PENDING   Incomplete  BODY FLUID CULTURE     Status: None   Collection Time    11/22/13  1:22 PM      Result Value Ref Range Status   Specimen Description PLEURAL FLUID LEFT   Final   Special Requests 6.0ML FLUID   Final   Gram Stain     Final   Value: RARE WBC PRESENT,BOTH PMN AND MONONUCLEAR     NO ORGANISMS SEEN     Performed at [facility]   Culture     Final   Value: NO GROWTH 3 DAYS     Performed at [facility]   Report Status 11/25/2013 FINAL   Final  FUNGAL STAIN     Status: None   Collection Time     11/22/13  1:23 PM      Result Value Ref Range Status   Specimen Description PLEURAL FLUID LEFT   Final   Special Requests 6.0ML FLUID   Final   Fungal Smear     Final   Value: NO YEAST OR FUNGAL ELEMENTS SEEN     Performed at [facility]   Report Status 11/23/2013 FINAL   Final    Studies/Results: No results found.   Assessment/Plan: Lung CA Respiratory distress Effusion COPD Fungemia Bacteremia/ C tropicalis  Total days of antibiotics 5 (imipenem/micafungin) Will change micafungin to fluconazole given he has C tropicalis.  No change in imipenem given his "pen allergy", will clarify this.  Comfort measures noted.  Bobby Rumpf Infectious Diseases (pager) [phone] [email or web address] 11/25/2013, 1:16 PM  LOS: 4 days     D/i pharmacy, will continue micafungin for now (due to LFTs).

## 2013-11-25 NOTE — Progress Notes (Signed)
Name: [name]. MRN: [id number] DOB: Oct 10, 1935    ADMISSION DATE:  11/15/2013 CONSULTATION DATE:  11/28/2013  REFERRING MD :  Marinda Elk PRIMARY SERVICE:  [facility]  CHIEF COMPLAINT:  AMS  BRIEF PATIENT DESCRIPTION: 78 yo male with stage IIIB/IV non-small cell lung cancer and GOLD C COPD (PW patient) presented to ED after syncopal episode at home. Initially thought to have possibly had a stroke, further workup revealed more of a infectious type picture. CXR noted new mod-large L pleural effusion. PCCM consulted for further eval.   SIGNIFICANT EVENTS / STUDIES:  9/10 CT head > No acute intracranial process, L mastoid effusion 9/10 CTA Chest > No PE, severe emphysematous changes bilat, large L effusion, Persistent mass LLL w/ associated ATX 9/11 Emergent  thoracentesis>>> 2L clear fluid, protein 3.3, LDH 106, wbc 481, improved status 9/14 Overnight episode of desaturations into 50's with "passing out spell"   SUBJECTIVE:  Pt reports SOB at rest, remains of 100% NRB   VITAL SIGNS: Temp:  [97.6 F (36.4 C)-98.4 F (36.9 C)] 97.6 F (36.4 C) (09/14 0524) Pulse Rate:  [117-125] 117 (09/14 0524) Resp:  [20-22] 22 (09/14 0524) BP: (104-131)/(50-89) 104/50 mmHg (09/14 0524) SpO2:  [71 %-96 %] 96 % (09/14 0524) FiO2 (%):  [100 %] 100 % (09/14 0524)  PHYSICAL EXAMINATION: General:  Chronically and acutely ill appearing Neuro:  Alert, oriented x 3 HEENT: mm dry, no JVD Cardiovascular: s1s2 RRR Lungs:  resps even, mildly tachypneic, diminished, faint crackles lower posterior  Abdomen:  Soft, non-tender, non-distended Musculoskeletal:  No acute deformity or ROM limitation Skin:  Intact, ecchymosis to BUE. Port-a-cath in place R anterior chest wall, no drainage.  R IJ TLC c/d/i   Recent Labs Lab 12/01/2013 1248 12/11/2013 1251 11/22/13 0320 11/23/13 0530  NA 139 137 137 143  K 3.7 3.5* 3.6* 3.6*  CL 97 100 102 109  CO2 19  --  17* 24  BUN 12 11 15 18   CREATININE 1.11 1.20 1.17  0.99  GLUCOSE 83 87 213* 108*    Recent Labs Lab 12/11/2013 1248 12/04/2013 1251 11/22/13 0320 11/23/13 0530  HGB 8.9* 10.2* 8.1* 7.6*  HCT 28.3* 30.0* 25.7* 23.5*  WBC 7.0  --  34.1* 11.9*  PLT 184  --  257 112*   No results found.  ASSESSMENT / PLAN:  PULMONARY A: Acute hypoxic respiratory failure in setting of L effusion and post obstructive ATX (mass) vs HCAP Large left pleural effusion- note last thora from 6/30 demonstrated reactive mesothelial cells and chronic inflammation. NSCLC s/p radiation and undergoing chemo ? Post obstructive pneumonitis H/o GOLD C - COPD without evidence of acute exacerbation.  DNI Status P:   Supplemental O2 as needed for comfort but no need to monitor sats S/p thoracentesis 9/11 with improvement in symptoms but very poor gas exchange continues  Continue abx as per ID section. Cont BD  Follow cytology, if pos and survivable admission - pleurex for symptom control if needed  Duragesic + PRN morphine for dyspnea / air hunger, discussed with patient & wife  CARDIOVASCULAR Right chest Port A Cath >>> 9/10 IJ>>> A:  Septic Shock Hx HTN Hx HLD DNR Status P:  Off tele DNR  RENAL A:  AG metabolic acidosis - lactate. Improved.  P:   NS @ KVO  GASTROINTESTINAL A:   Nausea, vomiting.  ?ileus  P:   IV reglan / tube out am 9/13 at pt's request   HEMATOLOGIC A:  VTE prophylaxis  P:  Lovenox in setting cancer / SCD's. CBC in AM.  INFECTIOUS A:   Septic Shock - in setting of large left pleural effusion with possible post obstructive HCAP Doubt empyema  Gram neg bacteremia, may need port dc P:   Resp Virus 9/10 >>>neg C-dif 9/10 > Neg Blood 9/10 >>>1/2 GNR,  1/2 Yeast>>>  Flagyl 9/11>>>9/12 Diflucan 9/11>>>9/12 Imipenem 9/11>>> Mycofungin 9/12>>> Per ID    ENDOCRINE A:  DM R/o rel AI - baseline cortisol 35 9/11 P:   Monitor CBG's. SSI for CBG > 180. Stress steroids stopped 9/13    NEUROLOGIC A:   Acute  encephalopathy - resolved P:   Monitor mental status Duragesic + PRN oral morphine for pain / dyspnea     TODAY'S SUMMARY:   78 y/o M with stage IIIB/IV NSCLCA + GOLD C COPD admitted with syncope.  Work up concerning for fungemia.  NOTE he has a port a cath.  Doing poorly on floor in terms of "numbers" with poor gas exchange but reasonable work of breathing.  DNR status and he and wife agreeable to shift to comfort based care while trying to treat the underlying infection.  Continue duragesic and double until adequate palliation.  Rx abx/antifungals per ID.    Noe Gens, NP-C [facility] Pgr: [phone] or [phone]    Merton Border, MD ; [facility] service Mobile [phone].  After 5:30 PM or weekends, call [phone]

## 2013-11-27 ENCOUNTER — Ambulatory Visit: Payer: Medicare Other | Admitting: Nurse Practitioner

## 2013-11-27 ENCOUNTER — Other Ambulatory Visit: Payer: Medicare Other

## 2013-11-27 ENCOUNTER — Ambulatory Visit: Payer: Medicare Other

## 2013-11-27 LAB — CULTURE, BLOOD (ROUTINE X 2)

## 2013-12-01 LAB — CULTURE, BLOOD (ROUTINE X 2)
CULTURE: NO GROWTH
Culture: NO GROWTH

## 2013-12-11 ENCOUNTER — Other Ambulatory Visit: Payer: Medicare Other

## 2013-12-11 ENCOUNTER — Ambulatory Visit: Payer: Medicare Other

## 2013-12-12 NOTE — Progress Notes (Signed)
Pt has been resting comfortably, wearing non-re breather mask. RT notified staff pt may have passed when she went in to give scheduled breathing tx. 2 RNs verified no heart or breath sounds. Wife at bedside sleeping at time was notified and consoled.

## 2013-12-12 DEATH — deceased

## 2013-12-17 NOTE — Discharge Summary (Addendum)
DEATH SUMMARY  DATE OF ADMISSION:  23-Nov-2013  DATE OF DISCHARGE/DEATH:  28-Nov-2013  ADMISSION DIAGNOSES:   Pneumonia Sepsis Stage IV NSCLC Large malignant pleural effusion COPD DM 2 Hyperlipidemia Glaucoma  DISCHARGE DIAGNOSES:   Acute hypoxic respiratory failure Stage IV NSCLC  Post obstructive ATX vs PNA Large left pleural effusion  H/o GOLD C COPD without evidence of acute exacerbation.  Septic Shock  Hx HTN  Hx HLD  DNR Status  Lactic acidosis  Nausea, vomiting Severe sepsis Polymicrobial bacteremia Fungemia DM 2 Acute encephalopathy  NSTEMI Hypomagnesemia    PRESENTATION:   Pt was admitted to the stepdown unit by IMTS with the following HPI and the above admission diagnoses:  History of Present Illness:  [name]. is a 78 year old man with PMH of stage IV non-small cell lung cancer, DM2, CODP, HTN, and HLD who presented to the ED after being found unresponsive at home. [name]'s family reports that he was fatigued and felt chilled last night. They checked his blood sugar, and it was in the 50s. They gave him some sugar and a Boost to drink. His blood sugar eventually improved overnight, but this morning they found him unresponsive and called EMS. On arrival, he was awake and having a conversation with them, but he again became unresponsive en route and had some apparent R sided weakness. He was heading for [facility], but they diverted to [facility] and called a code stroke. On arrival, blood sugar was in the 130s and he was tachycardic with increased work of breathing, febrile, hypotensive, and had a normal WBC with elevated bands. Head CT was negative, but CXR showed a new large L pleural effusion. He was bolused with NS and received a total of 4.5 L with improvement in his blood pressure. He was empirically covered with vancomycin/cefepime. CTA was negative for PE, but demonstrated a significant increase in his pleural effusion from prior and severe  emphysematous changes bilaterally. 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 He then received palliative radiation therapy to the enlarging left lower lobe mass followed by docetaxel, which was continued until 09/18/12. Currently, he is receiving palliative therapy with Nivolumab every two weeks, last dose on 11/13/13. His next dose is scheduled for 11/27/13. He was noted to be DNR/DNI on admission   HOSPITAL COURSE:   Admitted to SDU 11-24-22 by IMTS with above diagnoses. After cultures obtained, broad spectrum abx were administered. CT head 2022-11-24 revealed no acute intracranial process, L mastoid effusion. CTA Chest 11/24/22 no PE, severe emphysematous changes bilat, large L effusion, persistent mass LLL w/ associated ATX. PCCM, neurology consults were obtained 2022-11-24. ID consult was obtained 9/12. Hypoxemia persisted throughout hospitalization despite NRB O2. He was found by RN pulseless, apneic and unresponsive in early morning hours of 11-29-22. No autopsy was performed     Merton Border, MD;  PCCM service; [place] [phone]

## 2014-01-04 LAB — AFB CULTURE WITH SMEAR (NOT AT ARMC): Acid Fast Smear: NONE SEEN

## 2014-05-04 IMAGING — CT CT ABD-PELV W/ CM
2 of 5 series · 16 of 46 positions shown, 18 images · IV contrast (omnipaque)
Comparison: 01/10/2013

CLINICAL DATA: Left lung cancer. Prior radiation and chemotherapy.
Restaging.

EXAM:
CT CHEST, ABDOMEN, AND PELVIS WITH CONTRAST
TECHNIQUE: Multidetector CT imaging of the chest, abdomen and pelvis was
performed following the standard protocol during bolus
administration of intravenous contrast.
CONTRAST:  100mL OMNIPAQUE IOHEXOL 300 MG/ML  SOLN

[Series 2: cap with st · axial · 0.97mm/px · z∈[-703,-108]mm · 13 of 135 slices shown, 15 images]
[im 8/135  soft-tissue]
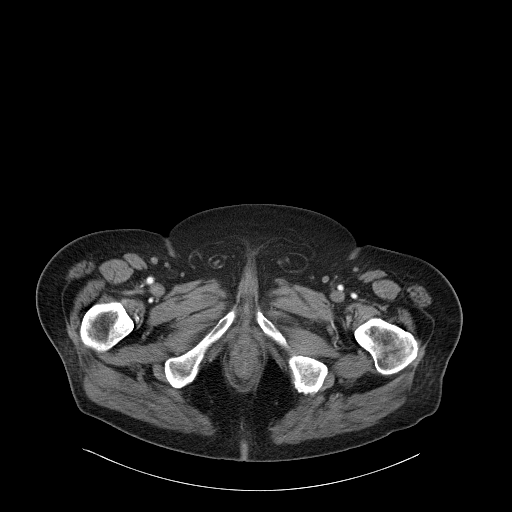
[im 8/135  bone]
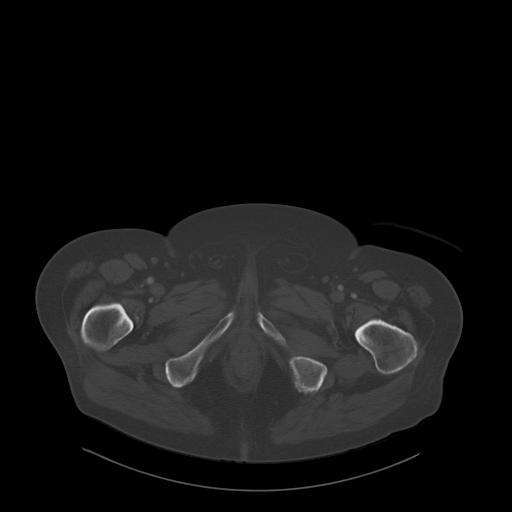
[im 16/135  soft-tissue]
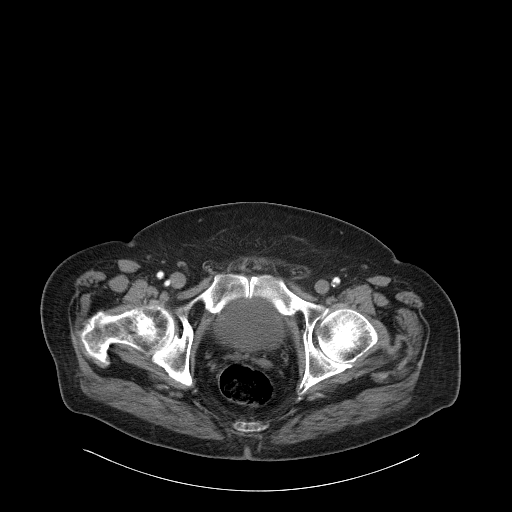
[im 32/135  soft-tissue]
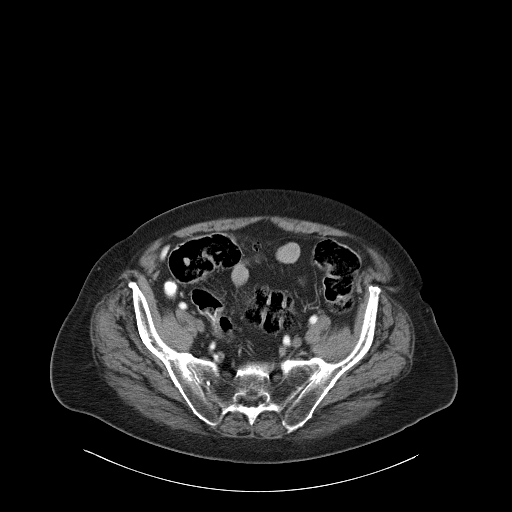
[im 40/135  soft-tissue]
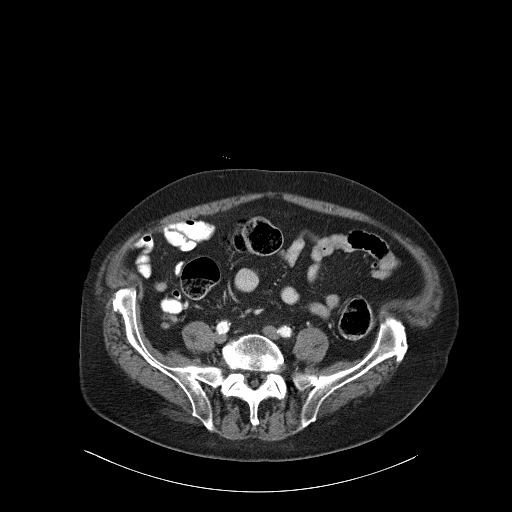
[im 48/135  soft-tissue]
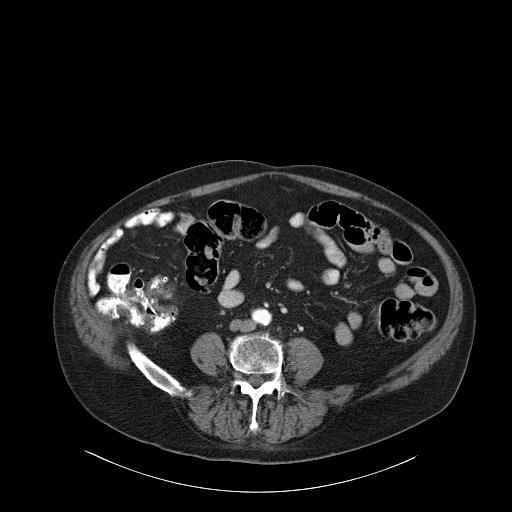
[im 56/135  soft-tissue]
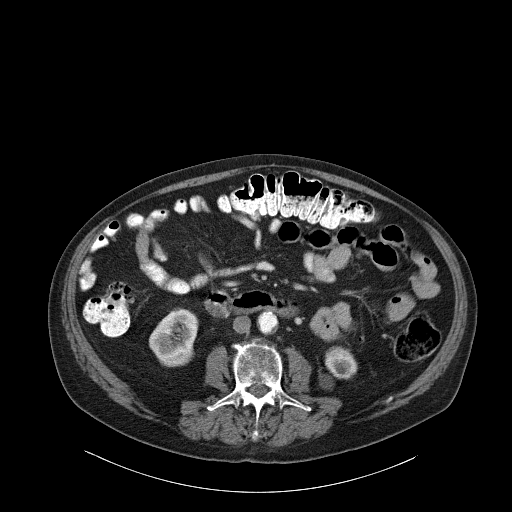
[im 71/135  soft-tissue]
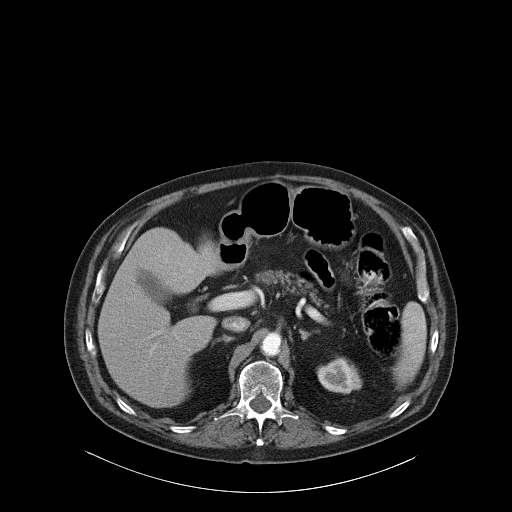
[im 79/135  soft-tissue]
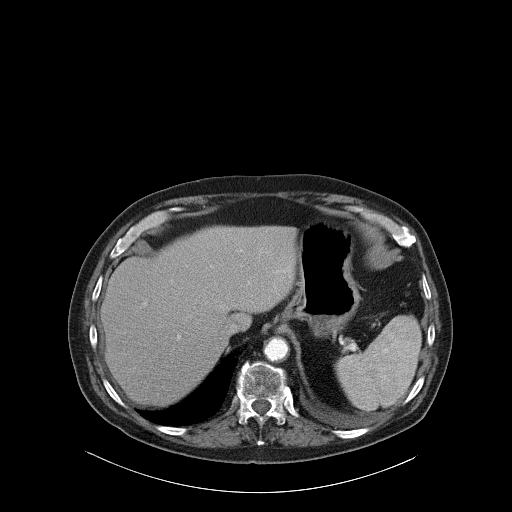
[im 87/135  soft-tissue]
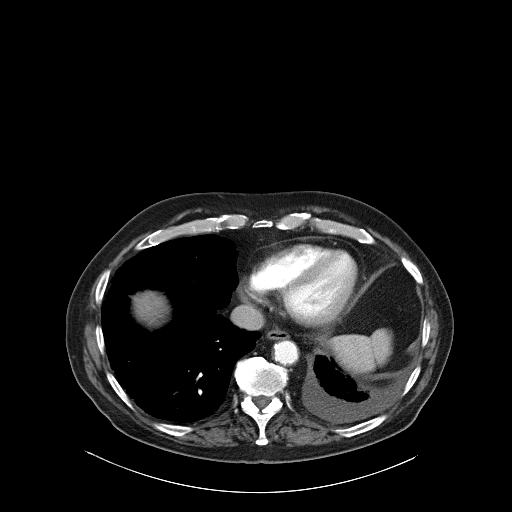
[im 87/135  bone]
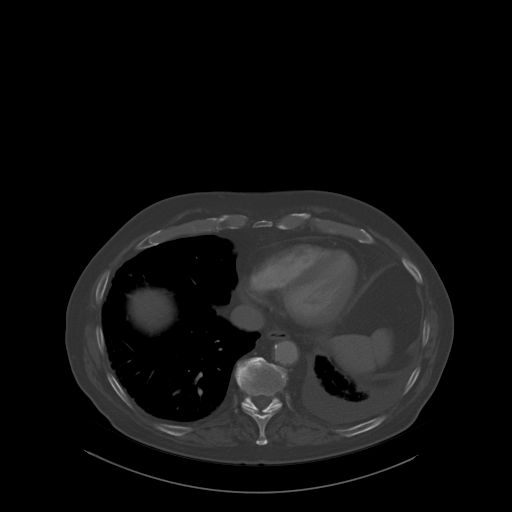
[im 95/135  soft-tissue]
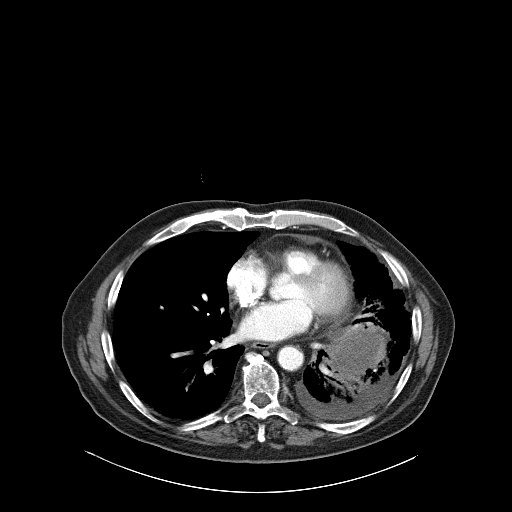
[im 103/135  soft-tissue]
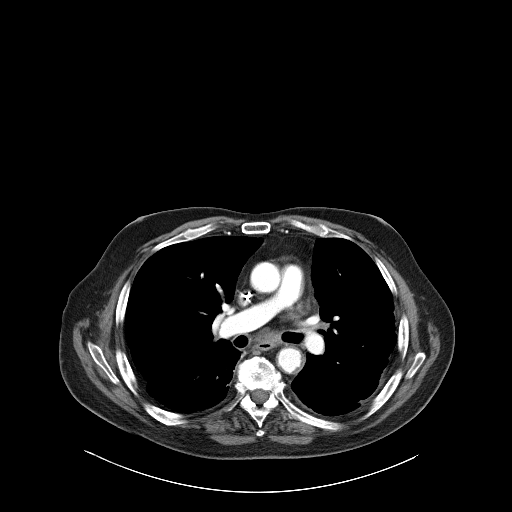
[im 119/135  soft-tissue]
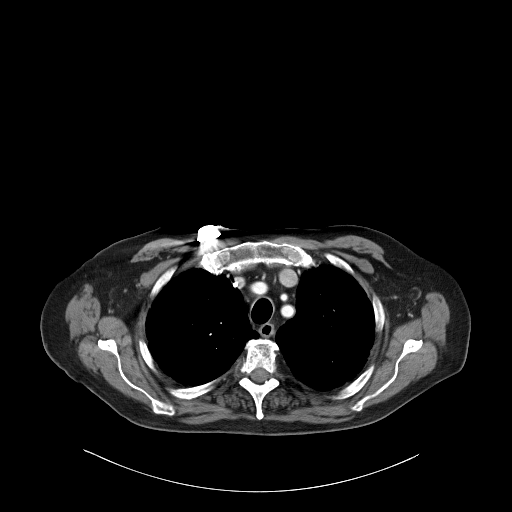
[im 127/135  soft-tissue]
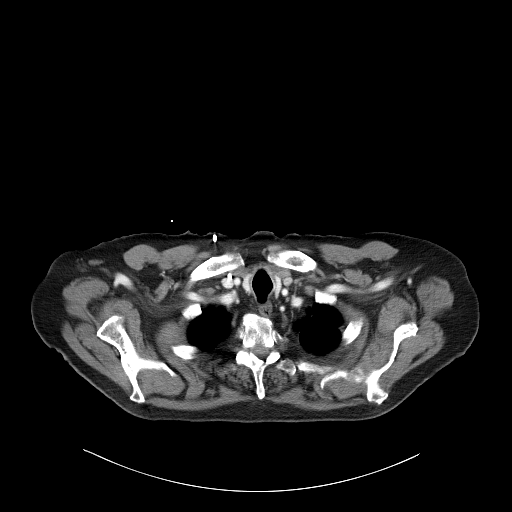

[Series 603: cor · coronal · 1.31mm/px · 3 of 105 slices shown]
[im 35/105  soft-tissue]
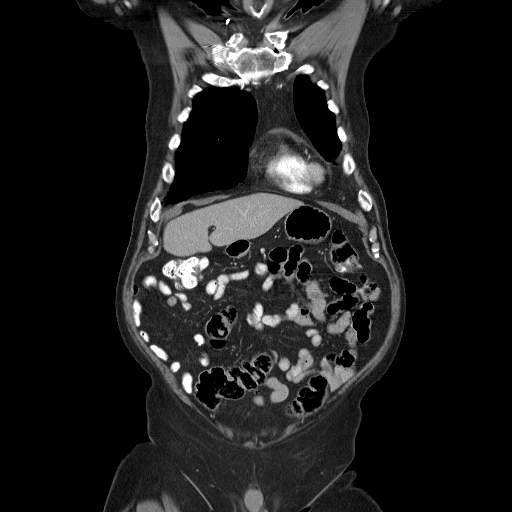
[im 47/105  soft-tissue]
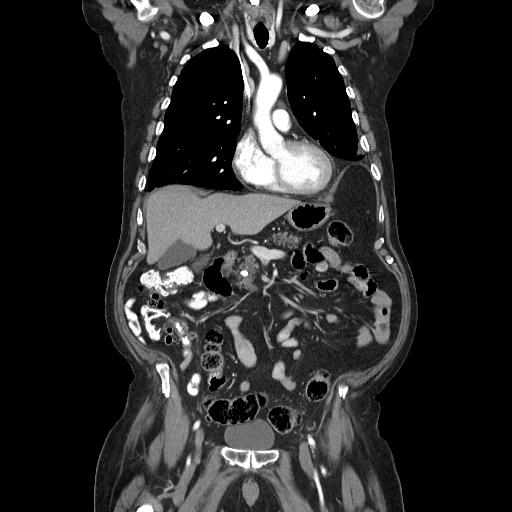
[im 58/105  soft-tissue]
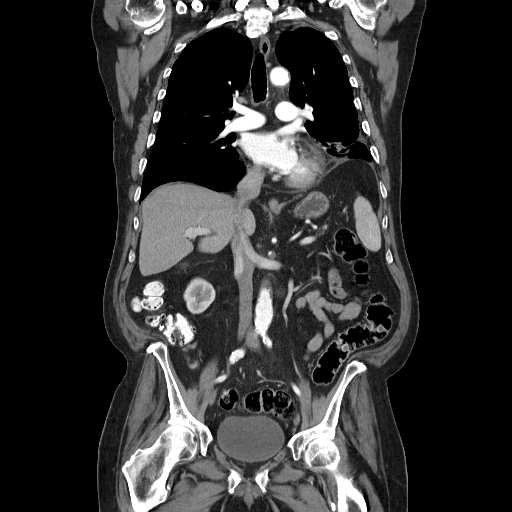

[16 of 46 positions shown; findings below may reference images not displayed]

FINDINGS: CT CHEST FINDINGS

The low-attenuation mass noted centrally in the left lower lobe is
again seen and measures approximately 5.8 x 4.6 cm. This appears
slightly larger when compared to prior study. This measures 5.4 x
4.3 cm compared with 4.9 x 4.4 cm previously. Small left pleural
effusion slightly smaller when compared to prior study. Airspace
disease around the central low-density mass likely reflects
postradiation changes/scarring.

Moderate to severe underlying COPD. No additional pulmonary nodules
or masses. No pleural effusion on the right. Heart is normal size.
Aorta is normal caliber. No mediastinal, hilar, or axillary
adenopathy. Chest wall soft tissues are unremarkable. Right
Port-A-Cath remains in place, unchanged.

CT ABDOMEN AND PELVIS FINDINGS

Calcifications scattered throughout the pancreas compatible with
chronic pancreatitis, stable. Liver, gallbladder, spleen, adrenals
are unremarkable. Small cyst off the lower pole of the left kidney
is stable. Right kidney unremarkable. No hydronephrosis.

Appendix is visualized and is normal. Stomach, large and small bowel
grossly unremarkable. Urinary bladder and prostate grossly
unremarkable. No free fluid, free air or adenopathy.

Aorta demonstrates irregular plaque.  No aneurysm.

No acute or focal osseous abnormality within the chest, abdomen or
pelvis. Degenerative changes throughout the thoracolumbar spine.
IMPRESSION: Central low-density mass within the left lower lobe measures
slightly larger on today's study. There is surrounding airspace
disease, likely postradiation, which makes visualization of the
margins of the mass somewhat difficult. May consider further
evaluation with PET CT.

Small left pleural effusion, slightly decreased.

Moderate to severe COPD changes.

No acute findings or evidence of metastatic disease in the abdomen
or pelvis.

## 2015-05-29 ENCOUNTER — Other Ambulatory Visit: Payer: Self-pay | Admitting: Nurse Practitioner
